# Patient Record
Sex: Male | Born: 1935 | Race: Black or African American | Hispanic: No | Marital: Married | Smoking: Current some day smoker
Health system: Southern US, Community
[De-identification: ages and names within clinical notes are randomized; demographics above are authoritative.]

## PROBLEM LIST (undated history)

## (undated) DIAGNOSIS — I498 Other specified cardiac arrhythmias: Secondary | ICD-10-CM

## (undated) DIAGNOSIS — Z973 Presence of spectacles and contact lenses: Secondary | ICD-10-CM

## (undated) DIAGNOSIS — G4733 Obstructive sleep apnea (adult) (pediatric): Secondary | ICD-10-CM

## (undated) DIAGNOSIS — Z950 Presence of cardiac pacemaker: Secondary | ICD-10-CM

## (undated) DIAGNOSIS — E7210 Disorders of sulfur-bearing amino-acid metabolism, unspecified: Secondary | ICD-10-CM

## (undated) DIAGNOSIS — E785 Hyperlipidemia, unspecified: Secondary | ICD-10-CM

## (undated) DIAGNOSIS — I10 Essential (primary) hypertension: Secondary | ICD-10-CM

## (undated) DIAGNOSIS — C61 Malignant neoplasm of prostate: Secondary | ICD-10-CM

## (undated) DIAGNOSIS — I6350 Cerebral infarction due to unspecified occlusion or stenosis of unspecified cerebral artery: Secondary | ICD-10-CM

## (undated) DIAGNOSIS — N259 Disorder resulting from impaired renal tubular function, unspecified: Secondary | ICD-10-CM

## (undated) DIAGNOSIS — I480 Paroxysmal atrial fibrillation: Secondary | ICD-10-CM

## (undated) DIAGNOSIS — M1990 Unspecified osteoarthritis, unspecified site: Secondary | ICD-10-CM

## (undated) DIAGNOSIS — Z972 Presence of dental prosthetic device (complete) (partial): Secondary | ICD-10-CM

## (undated) HISTORY — DX: Hyperlipidemia, unspecified: E78.5

## (undated) HISTORY — DX: Cerebral infarction due to unspecified occlusion or stenosis of unspecified cerebral artery: I63.50

## (undated) HISTORY — DX: Obstructive sleep apnea (adult) (pediatric): G47.33

## (undated) HISTORY — DX: Essential (primary) hypertension: I10

## (undated) HISTORY — PX: INGUINAL HERNIA REPAIR: SUR1180

## (undated) HISTORY — DX: Disorders of sulfur-bearing amino-acid metabolism, unspecified: E72.10

## (undated) HISTORY — DX: Other specified cardiac arrhythmias: I49.8

## (undated) HISTORY — DX: Paroxysmal atrial fibrillation: I48.0

## (undated) HISTORY — DX: Disorder resulting from impaired renal tubular function, unspecified: N25.9

## (undated) HISTORY — DX: Malignant neoplasm of prostate: C61

## (undated) HISTORY — DX: Unspecified osteoarthritis, unspecified site: M19.90

## (undated) HISTORY — PX: COLONOSCOPY: SHX174

---

## 1985-05-14 HISTORY — PX: CHOLECYSTECTOMY: SHX55

## 1998-06-06 ENCOUNTER — Ambulatory Visit (HOSPITAL_BASED_OUTPATIENT_CLINIC_OR_DEPARTMENT_OTHER): Admission: RE | Admit: 1998-06-06 | Discharge: 1998-06-06 | Payer: Self-pay | Admitting: General Surgery

## 2000-05-14 HISTORY — PX: ORCHIECTOMY: SHX2116

## 2000-09-23 ENCOUNTER — Encounter: Payer: Self-pay | Admitting: Emergency Medicine

## 2000-09-23 ENCOUNTER — Emergency Department (HOSPITAL_COMMUNITY): Admission: EM | Admit: 2000-09-23 | Discharge: 2000-09-23 | Payer: Self-pay

## 2001-01-29 ENCOUNTER — Encounter (INDEPENDENT_AMBULATORY_CARE_PROVIDER_SITE_OTHER): Payer: Self-pay | Admitting: *Deleted

## 2001-01-29 ENCOUNTER — Inpatient Hospital Stay (HOSPITAL_COMMUNITY): Admission: EM | Admit: 2001-01-29 | Discharge: 2001-02-08 | Payer: Self-pay

## 2001-01-29 ENCOUNTER — Encounter: Payer: Self-pay | Admitting: Pulmonary Disease

## 2001-01-30 ENCOUNTER — Encounter: Payer: Self-pay | Admitting: Pulmonary Disease

## 2001-02-03 ENCOUNTER — Encounter: Payer: Self-pay | Admitting: Pulmonary Disease

## 2001-03-07 ENCOUNTER — Ambulatory Visit (HOSPITAL_COMMUNITY): Admission: RE | Admit: 2001-03-07 | Discharge: 2001-03-07 | Payer: Self-pay | Admitting: Urology

## 2001-03-07 ENCOUNTER — Encounter: Payer: Self-pay | Admitting: Urology

## 2001-03-17 ENCOUNTER — Ambulatory Visit (HOSPITAL_COMMUNITY): Admission: RE | Admit: 2001-03-17 | Discharge: 2001-03-17 | Payer: Self-pay | Admitting: Urology

## 2001-03-17 ENCOUNTER — Encounter: Payer: Self-pay | Admitting: Urology

## 2001-06-03 ENCOUNTER — Ambulatory Visit (HOSPITAL_BASED_OUTPATIENT_CLINIC_OR_DEPARTMENT_OTHER): Admission: RE | Admit: 2001-06-03 | Discharge: 2001-06-03 | Payer: Self-pay | Admitting: Urology

## 2001-12-02 ENCOUNTER — Ambulatory Visit (HOSPITAL_BASED_OUTPATIENT_CLINIC_OR_DEPARTMENT_OTHER): Admission: RE | Admit: 2001-12-02 | Discharge: 2001-12-02 | Payer: Self-pay | Admitting: Urology

## 2001-12-16 ENCOUNTER — Encounter: Payer: Self-pay | Admitting: Urology

## 2001-12-16 ENCOUNTER — Encounter: Admission: RE | Admit: 2001-12-16 | Discharge: 2001-12-16 | Payer: Self-pay | Admitting: Urology

## 2002-03-09 ENCOUNTER — Encounter: Admission: RE | Admit: 2002-03-09 | Discharge: 2002-03-09 | Payer: Self-pay | Admitting: Urology

## 2002-03-09 ENCOUNTER — Encounter: Payer: Self-pay | Admitting: Urology

## 2002-03-12 ENCOUNTER — Ambulatory Visit (HOSPITAL_BASED_OUTPATIENT_CLINIC_OR_DEPARTMENT_OTHER): Admission: RE | Admit: 2002-03-12 | Discharge: 2002-03-12 | Payer: Self-pay | Admitting: Urology

## 2002-05-14 DIAGNOSIS — G4733 Obstructive sleep apnea (adult) (pediatric): Secondary | ICD-10-CM

## 2002-05-14 HISTORY — PX: OTHER SURGICAL HISTORY: SHX169

## 2002-05-14 HISTORY — DX: Obstructive sleep apnea (adult) (pediatric): G47.33

## 2002-08-16 ENCOUNTER — Ambulatory Visit (HOSPITAL_BASED_OUTPATIENT_CLINIC_OR_DEPARTMENT_OTHER): Admission: RE | Admit: 2002-08-16 | Discharge: 2002-08-16 | Payer: Self-pay | Admitting: Pulmonary Disease

## 2002-10-30 ENCOUNTER — Encounter: Payer: Self-pay | Admitting: Otolaryngology

## 2002-11-04 ENCOUNTER — Encounter (INDEPENDENT_AMBULATORY_CARE_PROVIDER_SITE_OTHER): Payer: Self-pay | Admitting: Specialist

## 2002-11-04 ENCOUNTER — Ambulatory Visit (HOSPITAL_COMMUNITY): Admission: RE | Admit: 2002-11-04 | Discharge: 2002-11-06 | Payer: Self-pay | Admitting: Otolaryngology

## 2002-11-05 ENCOUNTER — Encounter: Payer: Self-pay | Admitting: Cardiovascular Disease

## 2003-03-21 ENCOUNTER — Inpatient Hospital Stay (HOSPITAL_COMMUNITY): Admission: EM | Admit: 2003-03-21 | Discharge: 2003-03-23 | Payer: Self-pay | Admitting: Emergency Medicine

## 2003-04-13 ENCOUNTER — Encounter: Admission: RE | Admit: 2003-04-13 | Discharge: 2003-04-13 | Payer: Self-pay | Admitting: Urology

## 2004-06-11 ENCOUNTER — Inpatient Hospital Stay (HOSPITAL_COMMUNITY): Admission: EM | Admit: 2004-06-11 | Discharge: 2004-06-14 | Payer: Self-pay | Admitting: Emergency Medicine

## 2004-06-12 ENCOUNTER — Ambulatory Visit: Payer: Self-pay | Admitting: Pulmonary Disease

## 2004-06-12 ENCOUNTER — Encounter: Payer: Self-pay | Admitting: Cardiology

## 2004-06-12 ENCOUNTER — Ambulatory Visit: Payer: Self-pay | Admitting: Cardiology

## 2004-06-21 ENCOUNTER — Ambulatory Visit: Payer: Self-pay | Admitting: Pulmonary Disease

## 2004-08-24 ENCOUNTER — Ambulatory Visit: Payer: Self-pay | Admitting: Pulmonary Disease

## 2005-03-01 ENCOUNTER — Ambulatory Visit: Payer: Self-pay | Admitting: Pulmonary Disease

## 2006-04-08 ENCOUNTER — Ambulatory Visit: Payer: Self-pay | Admitting: Pulmonary Disease

## 2006-04-11 LAB — CONVERTED CEMR LAB
ALT: 14 units/L (ref 0–40)
AST: 21 units/L (ref 0–37)
Albumin: 3.6 g/dL (ref 3.5–5.2)
Alkaline Phosphatase: 108 units/L (ref 39–117)
BUN: 17 mg/dL (ref 6–23)
Basophils Absolute: 0 10*3/uL (ref 0.0–0.1)
Basophils Relative: 0.4 % (ref 0.0–1.0)
CO2: 25 meq/L (ref 19–32)
Calcium: 9.3 mg/dL (ref 8.4–10.5)
Chloride: 108 meq/L (ref 96–112)
Chol/HDL Ratio, serum: 4.3
Cholesterol: 224 mg/dL (ref 0–200)
Creatinine, Ser: 2 mg/dL — ABNORMAL HIGH (ref 0.4–1.5)
Eosinophil percent: 3.4 % (ref 0.0–5.0)
GFR calc non Af Amer: 35 mL/min
Glomerular Filtration Rate, Af Am: 43 mL/min/{1.73_m2}
Glucose, Bld: 100 mg/dL — ABNORMAL HIGH (ref 70–99)
HCT: 40.6 % (ref 39.0–52.0)
HDL: 51.5 mg/dL (ref >39.0)
Hemoglobin: 13.5 g/dL (ref 13.0–17.0)
LDL DIRECT: 159.6 mg/dL
Lymphocytes Relative: 31 % (ref 12.0–46.0)
MCHC: 33.3 g/dL (ref 30.0–36.0)
MCV: 87.4 fL (ref 78.0–100.0)
Monocytes Absolute: 0.5 10*3/uL (ref 0.2–0.7)
Monocytes Relative: 7.2 % (ref 3.0–11.0)
Neutro Abs: 4 10*3/uL (ref 1.4–7.7)
Neutrophils Relative %: 58 % (ref 43.0–77.0)
PSA: 0.31 ng/mL (ref 0.10–4.00)
Platelets: 161 10*3/uL (ref 150–400)
Potassium: 4.2 meq/L (ref 3.5–5.1)
RBC: 4.64 M/uL (ref 4.22–5.81)
RDW: 14.2 % (ref 11.5–14.6)
Sodium: 141 meq/L (ref 135–145)
TSH: 1.28 microintl units/mL (ref 0.35–5.50)
Total Bilirubin: 0.6 mg/dL (ref 0.3–1.2)
Total Protein: 6.7 g/dL (ref 6.0–8.3)
Triglyceride fasting, serum: 77 mg/dL (ref 0–149)
VLDL: 15 mg/dL (ref 0–40)
WBC: 6.8 10*3/uL (ref 4.5–10.5)

## 2006-04-15 ENCOUNTER — Ambulatory Visit: Payer: Self-pay | Admitting: Pulmonary Disease

## 2007-03-21 ENCOUNTER — Ambulatory Visit: Payer: Self-pay | Admitting: Pulmonary Disease

## 2007-11-03 ENCOUNTER — Encounter: Payer: Self-pay | Admitting: Pulmonary Disease

## 2007-11-28 DIAGNOSIS — E785 Hyperlipidemia, unspecified: Secondary | ICD-10-CM

## 2007-11-28 DIAGNOSIS — I10 Essential (primary) hypertension: Secondary | ICD-10-CM | POA: Insufficient documentation

## 2007-12-01 ENCOUNTER — Ambulatory Visit: Payer: Self-pay | Admitting: Pulmonary Disease

## 2007-12-01 DIAGNOSIS — I6350 Cerebral infarction due to unspecified occlusion or stenosis of unspecified cerebral artery: Secondary | ICD-10-CM | POA: Insufficient documentation

## 2007-12-01 DIAGNOSIS — C61 Malignant neoplasm of prostate: Secondary | ICD-10-CM

## 2007-12-01 DIAGNOSIS — E7210 Disorders of sulfur-bearing amino-acid metabolism, unspecified: Secondary | ICD-10-CM | POA: Insufficient documentation

## 2007-12-01 DIAGNOSIS — F17200 Nicotine dependence, unspecified, uncomplicated: Secondary | ICD-10-CM

## 2007-12-01 DIAGNOSIS — G4733 Obstructive sleep apnea (adult) (pediatric): Secondary | ICD-10-CM

## 2007-12-01 DIAGNOSIS — N183 Chronic kidney disease, stage 3 (moderate): Secondary | ICD-10-CM

## 2007-12-07 LAB — CONVERTED CEMR LAB
ALT: 18 units/L (ref 0–53)
AST: 19 units/L (ref 0–37)
Albumin: 3.9 g/dL (ref 3.5–5.2)
Alkaline Phosphatase: 111 units/L (ref 39–117)
BUN: 17 mg/dL (ref 6–23)
Basophils Absolute: 0 10*3/uL (ref 0.0–0.1)
Basophils Relative: 0.1 % (ref 0.0–3.0)
Bilirubin, Direct: 0.1 mg/dL (ref 0.0–0.3)
CO2: 24 meq/L (ref 19–32)
Calcium: 10 mg/dL (ref 8.4–10.5)
Chloride: 109 meq/L (ref 96–112)
Cholesterol: 244 mg/dL (ref 0–200)
Creatinine, Ser: 1.6 mg/dL — ABNORMAL HIGH (ref 0.4–1.5)
Direct LDL: 167.3 mg/dL
Eosinophils Absolute: 0.3 10*3/uL (ref 0.0–0.7)
Eosinophils Relative: 3.1 % (ref 0.0–5.0)
GFR calc Af Amer: 55 mL/min
GFR calc non Af Amer: 45 mL/min
Glucose, Bld: 89 mg/dL (ref 70–99)
HCT: 41.5 % (ref 39.0–52.0)
HDL: 44.6 mg/dL (ref >39.0)
Hemoglobin: 13.8 g/dL (ref 13.0–17.0)
Lymphocytes Relative: 29.3 % (ref 12.0–46.0)
MCHC: 33.1 g/dL (ref 30.0–36.0)
MCV: 88.5 fL (ref 78.0–100.0)
Monocytes Absolute: 0.4 10*3/uL (ref 0.1–1.0)
Monocytes Relative: 4.2 % (ref 3.0–12.0)
Neutro Abs: 5.3 10*3/uL (ref 1.4–7.7)
Neutrophils Relative %: 63.3 % (ref 43.0–77.0)
Platelets: 156 10*3/uL (ref 150–400)
Potassium: 3.9 meq/L (ref 3.5–5.1)
RBC: 4.69 M/uL (ref 4.22–5.81)
RDW: 14.5 % (ref 11.5–14.6)
Sodium: 141 meq/L (ref 135–145)
TSH: 1.67 microintl units/mL (ref 0.35–5.50)
Total Bilirubin: 0.6 mg/dL (ref 0.3–1.2)
Total CHOL/HDL Ratio: 5.5
Total Protein: 6.9 g/dL (ref 6.0–8.3)
Triglycerides: 217 mg/dL (ref 0–149)
VLDL: 43 mg/dL — ABNORMAL HIGH (ref 0–40)
WBC: 8.5 10*3/uL (ref 4.5–10.5)

## 2008-02-03 ENCOUNTER — Telehealth (INDEPENDENT_AMBULATORY_CARE_PROVIDER_SITE_OTHER): Payer: Self-pay | Admitting: *Deleted

## 2008-05-10 ENCOUNTER — Encounter: Payer: Self-pay | Admitting: Pulmonary Disease

## 2008-08-11 ENCOUNTER — Encounter: Payer: Self-pay | Admitting: Pulmonary Disease

## 2008-11-08 ENCOUNTER — Encounter: Payer: Self-pay | Admitting: Pulmonary Disease

## 2008-11-18 ENCOUNTER — Ambulatory Visit: Payer: Self-pay | Admitting: Pulmonary Disease

## 2008-11-18 ENCOUNTER — Encounter: Payer: Self-pay | Admitting: Adult Health

## 2008-11-18 DIAGNOSIS — I498 Other specified cardiac arrhythmias: Secondary | ICD-10-CM

## 2008-11-22 DIAGNOSIS — R609 Edema, unspecified: Secondary | ICD-10-CM

## 2008-11-24 ENCOUNTER — Encounter: Payer: Self-pay | Admitting: Pulmonary Disease

## 2009-02-13 ENCOUNTER — Emergency Department (HOSPITAL_COMMUNITY): Admission: EM | Admit: 2009-02-13 | Discharge: 2009-02-13 | Payer: Self-pay | Admitting: Emergency Medicine

## 2009-02-18 ENCOUNTER — Telehealth: Payer: Self-pay | Admitting: Pulmonary Disease

## 2009-02-18 ENCOUNTER — Ambulatory Visit: Payer: Self-pay | Admitting: Pulmonary Disease

## 2009-03-09 ENCOUNTER — Encounter: Payer: Self-pay | Admitting: Pulmonary Disease

## 2009-07-07 ENCOUNTER — Ambulatory Visit: Payer: Self-pay | Admitting: Pulmonary Disease

## 2009-07-09 DIAGNOSIS — M1990 Unspecified osteoarthritis, unspecified site: Secondary | ICD-10-CM | POA: Insufficient documentation

## 2009-07-09 LAB — CONVERTED CEMR LAB
ALT: 14 units/L (ref 0–53)
AST: 16 units/L (ref 0–37)
Albumin: 3.9 g/dL (ref 3.5–5.2)
Alkaline Phosphatase: 105 units/L (ref 39–117)
BUN: 17 mg/dL (ref 6–23)
Basophils Absolute: 0.1 10*3/uL (ref 0.0–0.1)
Basophils Relative: 0.7 % (ref 0.0–3.0)
Bilirubin, Direct: 0.1 mg/dL (ref 0.0–0.3)
CO2: 30 meq/L (ref 19–32)
Calcium: 10.7 mg/dL — ABNORMAL HIGH (ref 8.4–10.5)
Chloride: 110 meq/L (ref 96–112)
Cholesterol: 243 mg/dL — ABNORMAL HIGH (ref 0–200)
Creatinine, Ser: 1.9 mg/dL — ABNORMAL HIGH (ref 0.4–1.5)
Direct LDL: 168.4 mg/dL
Eosinophils Absolute: 0.1 10*3/uL (ref 0.0–0.7)
Eosinophils Relative: 1.8 % (ref 0.0–5.0)
GFR calc non Af Amer: 44.75 mL/min (ref >60)
Glucose, Bld: 94 mg/dL (ref 70–99)
HCT: 43 % (ref 39.0–52.0)
HDL: 61.3 mg/dL (ref >39.00)
Hemoglobin: 13.9 g/dL (ref 13.0–17.0)
Lymphocytes Relative: 30.9 % (ref 12.0–46.0)
Lymphs Abs: 2.5 10*3/uL (ref 0.7–4.0)
MCHC: 32.4 g/dL (ref 30.0–36.0)
MCV: 90.4 fL (ref 78.0–100.0)
Monocytes Absolute: 0.6 10*3/uL (ref 0.1–1.0)
Monocytes Relative: 7.2 % (ref 3.0–12.0)
Neutro Abs: 4.7 10*3/uL (ref 1.4–7.7)
Neutrophils Relative %: 59.4 % (ref 43.0–77.0)
PSA: 0.75 ng/mL (ref 0.10–4.00)
Platelets: 185 10*3/uL (ref 150.0–400.0)
Potassium: 4.8 meq/L (ref 3.5–5.1)
RBC: 4.76 M/uL (ref 4.22–5.81)
RDW: 14.4 % (ref 11.5–14.6)
Sodium: 145 meq/L (ref 135–145)
TSH: 1.5 microintl units/mL (ref 0.35–5.50)
Total Bilirubin: 0.4 mg/dL (ref 0.3–1.2)
Total CHOL/HDL Ratio: 4
Total Protein: 7.7 g/dL (ref 6.0–8.3)
Triglycerides: 140 mg/dL (ref 0.0–149.0)
VLDL: 28 mg/dL (ref 0.0–40.0)
WBC: 8 10*3/uL (ref 4.5–10.5)

## 2009-09-12 ENCOUNTER — Telehealth (INDEPENDENT_AMBULATORY_CARE_PROVIDER_SITE_OTHER): Payer: Self-pay | Admitting: *Deleted

## 2009-09-19 ENCOUNTER — Encounter: Payer: Self-pay | Admitting: Pulmonary Disease

## 2009-11-04 ENCOUNTER — Ambulatory Visit: Payer: Self-pay | Admitting: Pulmonary Disease

## 2009-11-05 LAB — CONVERTED CEMR LAB
BUN: 20 mg/dL (ref 6–23)
CO2: 28 meq/L (ref 19–32)
Calcium: 10.3 mg/dL (ref 8.4–10.5)
Chloride: 113 meq/L — ABNORMAL HIGH (ref 96–112)
Cholesterol: 177 mg/dL (ref 0–200)
Creatinine, Ser: 1.8 mg/dL — ABNORMAL HIGH (ref 0.4–1.5)
GFR calc non Af Amer: 48.52 mL/min (ref >60)
Glucose, Bld: 84 mg/dL (ref 70–99)
HDL: 57.9 mg/dL (ref >39.00)
LDL Cholesterol: 99 mg/dL (ref 0–99)
Potassium: 4.7 meq/L (ref 3.5–5.1)
Sodium: 145 meq/L (ref 135–145)
Total CHOL/HDL Ratio: 3
Triglycerides: 99 mg/dL (ref 0.0–149.0)
VLDL: 19.8 mg/dL (ref 0.0–40.0)

## 2010-03-27 ENCOUNTER — Encounter: Payer: Self-pay | Admitting: Pulmonary Disease

## 2010-04-13 ENCOUNTER — Ambulatory Visit: Payer: Self-pay | Admitting: Pulmonary Disease

## 2010-05-09 ENCOUNTER — Ambulatory Visit: Payer: Self-pay | Admitting: Pulmonary Disease

## 2010-05-10 LAB — CONVERTED CEMR LAB
BUN: 26 mg/dL — ABNORMAL HIGH (ref 6–23)
CO2: 26 meq/L (ref 19–32)
Calcium: 10.4 mg/dL (ref 8.4–10.5)
Chloride: 110 meq/L (ref 96–112)
Cholesterol: 180 mg/dL (ref 0–200)
Creatinine, Ser: 2 mg/dL — ABNORMAL HIGH (ref 0.4–1.5)
GFR calc non Af Amer: 42.33 mL/min — ABNORMAL LOW (ref >60.00)
Glucose, Bld: 91 mg/dL (ref 70–99)
HDL: 50.4 mg/dL (ref >39.00)
LDL Cholesterol: 96 mg/dL (ref 0–99)
Potassium: 4.8 meq/L (ref 3.5–5.1)
Sodium: 142 meq/L (ref 135–145)
Total CHOL/HDL Ratio: 4
Triglycerides: 170 mg/dL — ABNORMAL HIGH (ref 0.0–149.0)
VLDL: 34 mg/dL (ref 0.0–40.0)

## 2010-06-13 NOTE — Letter (Signed)
Summary: [facility]   Imported By: Sherian Rein 09/26/2009 11:58:22  _____________________________________________________________________  External Attachment:    Type:   Image     Comment:   External Document

## 2010-06-13 NOTE — Assessment & Plan Note (Signed)
Summary: 53mo follow up / cj   CC:  4 month ROV & review of mult medical problems....  History of Present Illness: 77 y/o BM here for a follow up visit... he has multiple medical problems as noted below...    ~  July 07, 2009:  he was last seen 7/09 for general med follow up...  he has a long hx of chronic non-compliance w/ medical therapy & since he was here last he admits to stopping his Aggrenox & Vytorin...  he still smokes but now <1ppd he says, & thinks he can quit on his own... his BP meds have been adjusted over the last yr- Off Doxazosin & Felodipine due to low BPs, and on Amlodipine 5mg  & Lasix 20mg  Prn for swelling> BP's at home are 150+ by his recollection... he's had follow up evals by DrVoytek for Ortho;  DrBerry for Cards; & DrWrenn for Urology>> see below for details...    **We decided to change the Aggrenox to ASA/ PLAVIX; Change Vytorin to [facility]; add LOSARTAN50 for his BP...   ~  November 04, 2009:  4 mo f/u & he states taking meds regularly> BP controlled on Amlod & Losar;  Chol managed w/ diet + Simva40; & no TIAs or CP on the ASA/ Plavix... he is still smoking 1/2 to 1ppd unfortunately but he has lost 8# down to 212# today...    Current Problem List:  OBSTRUCTIVE SLEEP APNEA (ICD-327.23) - hx prev ENT surgery by DrByers in 6/04...  CIGARETTE SMOKER (ICD-305.1) - he persists in smoking cigarettes despite his serious medical issues, prev stroke, HBP, etc...  ~  2/11: pt states <1ppd & he notes he can quit when he wants w/o help...  ~  CXR 2/11 showed tort vessels, lungs clear, thoracic spondylosis, NAD...  ~  6/11: still smoking 1/2 to 1ppd & not yet ready to quit, refuses smoking cessation help.  HYPERTENSION (ICD-401.9) - on AMLODIPINE 5mg /d & LOSARTAN 50mg /d... he has Hx of HBP, HCVD, & hx of a small intracerebral hemorrhage...  there is a long hx of non-compliance w/ his med regimen...  BP today = 140/60 & he states similar at home...  denies HA, fatigue, visual  changes, CP, palipit, dizziness, syncope, dyspnea, edema, etc...  ~  2/11:  as noted BP= 150/82 (& higher at home) on Amlodip5... rec> add LOSARTAN50.  ~  6/11: BP improved on the 2 meds> 140/60 & similar at home, reminded to take every day.  BRADYCARDIA (ICD-427.89) - pt was eval by DrBerry 7/10 for sinus bradycardia w/ rates in the 40's... he was asymptomatic w/ EKG showing LVH w/strain pattern; 2DEcho showing norm LVF; Cardiolite neg as well... decision made to continue observation & consider pacer if he becomes symptomatic...  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 prev on Foltx, but not currently taking any vitamins... advised regarding vitamin supplements needed.  HYPERLIPIDEMIA (ICD-272.4) - now on SIMVASTATIN 40mg /d, + diet Rx...  ~  FLP 11/07 showed TChol 224, TG 77, HDL 52, LDL 160... on diet alone- Vytorin 10-40 started...  ~  FLP 7/09 showed TChol 244, TG 217, HDL 45, LDL 167... not taking his Vytorin Rx.  ~  FLP 2/11 showed TChol 243, TG 140, HDL 61, LDL 168... not taking Vytorin, change to SIMVA40.  ~  FLP 6/11 on Simva40 showed TChol 177, TG 99, HDL 58, LDL 99... continue same.  Hx of METASTATIC PROSTATE CANCER (ICD-185) -  hx stage D Adenocarcinoma of the prostate w/ a large pelvic mass and bilat ureteral obstructions w/ renal failure (Creat= 12.2 & PSA >2600) Sep02... treated w/ orchiectomy and stents which were subsequently removed... his Creat improved to a baseline of  ~2, and his PSA's have improved to < 1 and stayed low... followed by DrWrenn regularly...  ~  10/10: note from DrWrenn indicates problems of Prostate Ca- s/p orchiectomy, renal insuffic w/ creat ~1.9, bladder neck contraction, & slowly rising PSA as outpt = 0.63 Jul10 w/ f/u Q32mo planned...  ~  5/11: note from DrWrenn indicates slowly rising PSA up to 0.92... continue watchful waiting.  RENAL INSUFFICIENCY (ICD-588.9) - baseline Creat  after recovery from the acute renal failure in 2002 has been  ~2.0...  ~  labs 7/09 showed BUN= 17, Creat= 1.6  ~  labs 2/11 showed BUN= 17, Creat= 1.9  ~  labs 6/11 showed BUN= 20, Creat= 1.8  DEGENERATIVE JOINT DISEASE (ICD-715.90) - eval by DrVoytek 3/10 for left shoulder pain, right knee pain- Dx= DJD & bursitis, given shot & MOBIC 7.5mg  (caution w/ his renal insuffic) but he prefers his horse linament  Hx of STROKE (ICD-434.91) - not taking his prev Aggrenox Rx- he stopped on his own  in 2009 stating that it caused him to be drowsey & sluggish (symptoms improved off Rx)... he was hosp 1/06 w/ TIA- right sided numbness, tingling, weakness... treated w/ heparin, then Aggrenox & improved... CT showed atophy, sm lacunar infarcts bilat, & chr ischemic changes... 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  ~  2/11: we discussed getting on ASA 81mg /d & PLAVIX 75mg /d as substitiute for Aggrenox due to his vasc dis.  ~  6/11: stable on the ASA/ Plavix w/o TIAs etc...   Preventive Screening-Counseling & Management  Alcohol-Tobacco     Smoking Status: current     Packs/Day: 1 ppd  Allergies (verified): No Known Drug Allergies  Comments:  Nurse/Medical Assistant: The patient's medications and allergies were reviewed with the patient and were updated in the Medication and Allergy Lists.  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 Father died age 35 w/ prostate cancer. Mother died age 16 w/ UTI, sepsis; otherw good health. 1 Sibling: Sister w/ DJD.  Social History: Reviewed history from 07/07/2009 and no changes required. Married, wife= [place], 56yrs. 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  The patient denies anorexia, fever, weight loss, weight gain, vision loss, decreased hearing, hoarseness, chest pain, syncope, peripheral edema, prolonged cough, headaches, hemoptysis, abdominal pain, melena, hematochezia, severe indigestion/heartburn, hematuria, incontinence, muscle weakness, suspicious skin lesions, transient blindness, difficulty walking, depression, unusual weight change, abnormal bleeding, enlarged lymph nodes, and angioedema.    Vital Signs:  Patient profile:   77 year old male Height:      71 inches Weight:      212  pounds BMI:     29.67 O2 Sat:      96 % on Room air Temp:     97.3 degrees F oral Pulse rate:   45 / minute BP sitting:   140 / 60  (left arm) Cuff size:   regular  Vitals Entered By: Randell Loop CMA (November 04, 2009 11:40 AM)  O2 Sat at Rest %:  96 O2 Flow:  Room air CC: 4 month ROV & review of mult medical problems... Is Patient Diabetic? No Pain Assessment Patient in pain? no      Comments meds updated today with pt   Physical Exam  Additional Exam:  WD, WN, 77 y/o BM in NAD... GENERAL:  Alert & oriented; pleasant & cooperative... HEENT:  [place]/AT, EOM-wnl, PERRLA, EACs-clear, TMs-wnl, NOSE-clear, THROAT-clear & wnl. NECK:  Supple w/ fairROM; no JVD; normal carotid impulses w/o bruits; no thyromegaly or nodules palpated; no lymphadenopathy. CHEST:  Clear to P & A; without wheezes/ rales/ or rhonchi heard... HEART:  Regular Rhythm; gr 1/6 SEM without rubs or gallops apprec... ABDOMEN:  Soft &  nontender; normal bowel sounds; no organomegaly or masses detected. EXT: without deformities, mod arthritic changes; no varicose veins/ +venous insuffic/ 1-2  edema. NEURO:  CN's intact; no focal neuro deficits... DERM:  No lesions noted; no rash etc...    MISC. Report  Procedure date:  11/04/2009  Findings:      BMP (METABOL)   Sodium                    145 mEq/L                   135-145   Potassium                 4.7 mEq/L                   3.5-5.1   Chloride             [H]  113 mEq/L                   96-112   Carbon Dioxide            28 mEq/L                    19-32   Glucose                   84 mg/dL                    16-10   BUN                       20 mg/dL                    9-60   Creatinine           [H]  1.8 mg/dL                   4.5-4.0   Calcium                   10.3 mg/dL                  9.8-11.9   GFR                       48.52 mL/min                >  60   Lipid Panel (LIPID)   Cholesterol               177 mg/dL                   2-841   Triglycerides             99.0 mg/dL                  3.2-440.1   HDL                       02.72 mg/dL                 >53.66   LDL Cholesterol           99 mg/dL                    4-40   Impression & Recommendations:  Problem # 1:  CIGARETTE SMOKER (ICD-305.1) We discussed the import of smoking cessation, AGAIN!!! offered Chantix etc but he declines help...  Problem # 2:  HYPERTENSION (ICD-401.9) BP controlled on these 2 meds>  continue the same. The following medications were removed from the medication list:    Furosemide 20 Mg Tabs (Furosemide) .Marland Kitchen... Take 1 tab by mouth once daily as needed for swelling... His updated medication list for this problem includes:    Amlodipine Besylate 5 Mg Tabs (Amlodipine besylate) .Marland Kitchen... 1 by mouth once daily    Losartan Potassium 50 Mg Tabs (Losartan potassium) .Marland Kitchen... Take 1 tab by mouth once daily...  Orders: TLB-BMP (Basic Metabolic Panel-BMET) (80048-METABOL)  Problem  # 3:  HYPERLIPIDEMIA (ICD-272.4) IMPROVED on the Simva40> encouraged to take daily!!! His updated medication list for this problem includes:    Simvastatin 40 Mg Tabs (Simvastatin) .Marland Kitchen... Take 1 tab by mouth at bedtime...  Orders: TLB-Lipid Panel (80061-LIPID)  Problem # 4:  Hx of METASTATIC PROSTATE CANCER (ICD-185) Followed by DrWrenn Q82mo w/ slowly rising PSA up to 0.92 4/11> it was >2600 at diagnosis w/ met dis!  Problem # 5:  RENAL INSUFFICIENCY (ICD-588.9) Stable mild renal insuffic w/ Creat= 1.8..Marland Kitchen Orders: TLB-BMP (Basic Metabolic Panel-BMET) (80048-METABOL) TLB-Lipid Panel (80061-LIPID)  Problem # 6:  Hx of STROKE (ICD-434.91) No residuals... he is admonished to take the ASA/ Plavix regularly... His updated medication list for this problem includes:    Aspirin 81 Mg Tbec (Aspirin) .Marland Kitchen... Take 1 tab by mouth once daily...    Plavix 75 Mg Tabs (Clopidogrel bisulfate) .Marland Kitchen... Take 1 tab by mouth once daily...  Complete Medication List: 1)  Aspirin 81 Mg Tbec (Aspirin) .... Take 1 tab by mouth once daily.Marland KitchenMarland Kitchen 2)  Plavix 75 Mg Tabs (Clopidogrel bisulfate) .... Take 1 tab by mouth once daily.Marland KitchenMarland Kitchen 3)  Amlodipine Besylate 5 Mg Tabs (Amlodipine besylate) .Marland Kitchen.. 1 by mouth once daily 4)  Losartan Potassium 50 Mg Tabs (Losartan potassium) .... Take 1 tab by mouth once daily.Marland KitchenMarland Kitchen 5)  Simvastatin 40 Mg Tabs (Simvastatin) .... Take 1 tab by mouth at bedtime.Marland KitchenMarland Kitchen 6)  Multivitamins Tabs (Multiple vitamin) .... Take 1 tablet by mouth once a day  Patient Instructions: 1)  Today we updated your med list- see below.... 2)  Continue your current meds the same... 3)  today we did your follow up FASTING blood work... please call the "phone tree" in a few days for your lab results.Marland KitchenMarland Kitchen  4)  Keep up the good work w/ diet + exercise... 5)  Call  for any questions.Marland KitchenMarland Kitchen 6)  Please schedule a follow-up appointment in 6 months.

## 2010-06-13 NOTE — Letter (Signed)
Summary: [facility]   Imported By: Sherian Rein 04/17/2010 08:21:44  _____________________________________________________________________  External Attachment:    Type:   Image     Comment:   External Document

## 2010-06-13 NOTE — Assessment & Plan Note (Signed)
Summary: flu shot ///kp  Nurse Visit   Allergies: No Known Drug Allergies  Orders Added: 1)  Flu Vaccine 57yrs + MEDICARE PATIENTS [Q2039] 2)  Administration Flu vaccine - MCR [G0008] Flu Vaccine Consent Questions     Do you have a history of severe allergic reactions to this vaccine? no    Any prior history of allergic reactions to egg and/or gelatin? no    Do you have a sensitivity to the preservative Thimersol? no    Do you have a past history of Guillan-Barre Syndrome? no    Do you currently have an acute febrile illness? no    Have you ever had a severe reaction to latex? no    Vaccine information given and explained to patient? yes    Are you currently pregnant? no    Lot Number:[id number]   Exp Date:11/11/2010   Site Given  Left Deltoid IM .Tammy Scott  April 13, 2010 3:38 PM

## 2010-06-13 NOTE — Assessment & Plan Note (Signed)
Summary: CPX/ MBW   CC:  19 month ROV & review of mult medical problems....  History of Present Illness: 77 y/o BM here for a follow up visit... he has multiple medical problems as noted below...    ~  July 07, 2009:  he was last seen 7/09 for general med follow up...  he has a long hx of chronic non-compliance w/ medical therapy & since he was here last he admits to stopping his Aggrenox & Vytorin...  he still smokes but now <1ppd he says, & thinks he can quit on his own... his BP meds have been adjusted over the last yr- Off Doxazosin & Felodipine due to low BPs, and on Amlodipine 5mg  & Lasix 20mg  Prn for swelling> BP's at home are 150+ by his recollection... he's had follow up evals by DrVoytek for Ortho;  DrBerry for Cards:  & DrWrenn for Urology>> see below for details...    **We decided to change the Aggrenox to ASA/ PLAVIX; Change Vytorin to [facility]; add LOSARTAN50 for his BP...    Current Problem List:  OBSTRUCTIVE SLEEP APNEA (ICD-327.23) - hx prev ENT surgery by DrByers in 6/04...  CIGARETTE SMOKER (ICD-305.1) - he persists in smoking cigarettes despite his serious medical issues, prev stroke, HBP, etc...  ~  2/11: pt states <1ppd & he notes he can quit when he wants w/o help...  ~  CXR 2/11 showed tort vessels, lungs clear, thoracic spondylosis, NAD...  HYPERTENSION (ICD-401.9) - on AMLODIPINE 5mg /d (prev on Doxazosin & Felodipine- ?stopped he says due to low BPs)... he has Hx of HBP, HCVD, & hx of a small intracerebral hemorrhage...  there is a long hx of non-compliance w/ his med regimen...  BP today = 152/82 & he notes higher at home...  denies HA, fatigue, visual changes, CP, palipit, dizziness, syncope, dyspnea, edema, etc...  ~  2/11:  as noted BP= 150/82 (& higher at home) on Amlodip5... rec> add LOSARTAN50.  BRADYCARDIA (ICD-427.89) - pt was eval by DrBerry 7/10 for sinus bradycardia w/ rates in the 40's... he was asymptomatic w/ EKG showing LVH w/strain pattern; 2DEcho  showing norm LVF; Cardiolite neg as well... decision made to continue observation & consider pacer if he becomes symptomatic...  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 prev on Foltx, but not currently taking any vitamins... advised regarding vitamin supplements needed.  HYPERLIPIDEMIA (ICD-272.4) - on diet alone now (he stopped his prev Vytorin 10-40 on his own)...  ~  FLP 11/07 showed TChol 224, TG 77, HDL 52, LDL 160... on diet alone- Vytorin 10-40 started...  ~  FLP 7/09 showed TChol 244, TG 217, HDL 45, LDL 167... not taking his Vytorin Rx.  ~  FLP 2/11 showed TChol 243, TG 140, HDL 61, LDL 168... not taking Vytorin, change to SIMVA40.  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 treated w/ orchiectomy and stents which were subsequently removed... his Creat improved to a baseline of  ~2, and his PSA's have improved to < 1 and stayed low... followed by DrWrenn and last seen 6/09 w/ PSA OK per pt...  ~  10/10: note from DrWrenn indicates problems of Prostate Ca- s/p orchiectomy, renal insuffic w/ creat ~1.9, bladder neck contraction, & slowly rising PSA as outpt = 0.63 Jul10 w/ f/u  Q66mo planned...  RENAL INSUFFICIENCY (ICD-588.9) - baseline Creat after recovery from the acute renal failure in 2002 has been  ~2.0...  ~  labs 7/09 showed BUN= 17, Creat= 1.6  ~  labs 2/11 showed BUN= 17, Creat= 1.9  DEGENERATIVE JOINT DISEASE (ICD-715.90) - eval by DrVoytek 3/10 for left shoulder pain, right knee pain- Dx= DJD & bursitis, given shot & MOBIC 7.5mg  (caution w/ his renal insuffic) but he prefers his horse linament  Hx of STROKE (ICD-434.91) - not taking his prev Aggrenox Rx- he stopped on his own  in 2009 stating that it caused him to be drowsey & sluggish (symptoms improved  off Rx)... he was hosp 1/06 w/ TIA- right sided numbness, tingling, weakness... treated w/ heparin, then Aggrenox & improved... CT showed atophy, sm lacunar infarcts bilat, & chr ischemic changes... 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  ~  2/11: we discussed getting on ASA 81mg /d & PLAVIX 75mg /d as substitiute for Aggrenox due to his vasc dis.   Allergies (verified): No Known Drug Allergies  Comments:  Nurse/Medical Assistant: The patient's medications and allergies were reviewed with the patient and were updated in the Medication and Allergy Lists.  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 Mother died age 1 w/ UTI, sepsis; otherw good health. 1 Sibling: Sister w/ DJD.  Social History: Married, wife= [place], 37yrs. 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  The patient denies fever, chills, sweats, anorexia, weakness, weight loss, sleep disorder, blurring, diplopia, eye irritation, eye discharge, vision loss, eye pain, photophobia, earache, ear discharge, tinnitus, decreased hearing, nasal congestion, nosebleeds, sore throat, hoarseness, chest pain, palpitations, syncope, dyspnea on exertion, orthopnea, PND, peripheral edema, cough, dyspnea at rest, excessive sputum, hemoptysis,  wheezing, pleurisy, nausea, vomiting, diarrhea, constipation, change in bowel habits, abdominal pain, melena, hematochezia, jaundice, gas/bloating, indigestion/heartburn, dysphagia, odynophagia, dysuria, hematuria, urinary frequency, urinary hesitancy, nocturia, incontinence, back pain, joint pain, joint swelling, muscle cramps, muscle weakness, stiffness, arthritis, sciatica, restless legs, leg pain at night, leg pain with exertion, rash, itching, dryness, suspicious lesions, paralysis, paresthesias, seizures, tremors, vertigo, transient blindness, frequent falls, frequent headaches, difficulty walking, depression, anxiety, memory loss, confusion, cold intolerance, heat intolerance, polydipsia, polyphagia, polyuria, unusual weight change, abnormal bruising, bleeding, enlarged lymph nodes, urticaria, allergic rash, hay fever, and recurrent infections.    Vital Signs:  Patient profile:   77 year old male Height:      71 inches Weight:      220 pounds O2 Sat:      96 % on Room air Temp:     97.7 degrees F oral Pulse rate:   41 / minute BP sitting:   152 / 82  (left arm) Cuff size:   regular  Vitals Entered By: Randell Loop CMA (July 07, 2009 9:24 AM)  O2 Sat at Rest %:  96 O2 Flow:  Room air CC: 19 month ROV & review of mult  medical problems... Is Patient Diabetic? No Pain Assessment Patient in pain? no      Comments meds updated today   Physical Exam  Additional Exam:  WD, WN, 77 y/o BM in NAD... GENERAL:  Alert & oriented; pleasant & cooperative... HEENT:  [place]/AT, EOM-wnl, PERRLA, EACs-clear, TMs-wnl, NOSE-clear, THROAT-clear & wnl. NECK:  Supple w/ fairROM; no JVD; normal carotid impulses w/o bruits; no thyromegaly or nodules palpated; no lymphadenopathy. CHEST:  Clear to P & A; without wheezes/ rales/ or rhonchi heard... HEART:  Regular Rhythm; gr 1/6 SEM without rubs or gallops apprec... ABDOMEN:  Soft & nontender; normal bowel sounds; no organomegaly or masses  detected. EXT: without deformities, mod arthritic changes; no varicose veins/ +venous insuffic/ 1-2  edema. NEURO:  CN's intact; no focal neuro deficits... DERM:  No lesions noted; no rash etc...    CXR  Procedure date:  07/07/2009  Findings:      CHEST - 2 VIEW Comparison: 03/12/2009.   Findings: Thoracic aortic ectasia/tortuosity.  Normal cardiac size. Lungs hyperaerated but clear of an active process.  Dorsal spine spondylosis.   IMPRESSION: COPD/emphysema.  No acute findings.   Read By:  Jonne Ply,  M.D.   MISC. Report  Procedure date:  07/07/2009  Findings:      Lipid Panel (LIPID)   Cholesterol          [H]  243 mg/dL                   4-098   Triglycerides             140.0 mg/dL                 1.1-914.7   HDL                       82.95 mg/dL                 >62.13 Cholesterol LDL - Direct                             168.4 mg/dL  BMP (METABOL)   Sodium                    145 mEq/L                   135-145   Potassium                 4.8 mEq/L                   3.5-5.1   Chloride                  110 mEq/L                   96-112   Carbon Dioxide            30 mEq/L                    19-32   Glucose                   94 mg/dL                    08-65   BUN  17 mg/dL                    1-61   Creatinine           [H]  1.9 mg/dL                   0.9-6.0   Calcium              [H]  10.7 mg/dL                  4.5-40.9   GFR                       44.75 mL/min                >60  Hepatic/Liver Function Panel (HEPATIC)   Total Bilirubin           0.4 mg/dL                   8.1-1.9   Direct Bilirubin          0.1 mg/dL                   1.4-7.8   Alkaline Phosphatase      105 U/L                     39-117   AST                       16 U/L                      0-37   ALT                       14 U/L                      0-53   Total Protein             7.7 g/dL                    2.9-5.6   Albumin                   3.9 g/dL                     2.1-3.0  Comments:      CBC Platelet w/Diff (CBCD)   White Cell Count          8.0 K/uL                    4.5-10.5   Red Cell Count            4.76 Mil/uL                 4.22-5.81   Hemoglobin                13.9 g/dL                   86.5-78.4   Hematocrit                43.0 %                      39.0-52.0   MCV  90.4 fl                     78.0-100.0   Platelet Count            185.0 K/uL                  150.0-400.0   Neutrophil %              59.4 %                      43.0-77.0   Lymphocyte %              30.9 %                      12.0-46.0   Monocyte %                7.2 %                       3.0-12.0   Eosinophils%              1.8 %                       0.0-5.0   Basophils %               0.7 %                       0.0-3.0   TSH (TSH)   FastTSH                   1.50 uIU/mL                 0.35-5.50  Prostate Specific Antigen (PSA)   PSA-Hyb                   0.75 ng/mL                  0.10-4.00   Impression & Recommendations:  Problem # 1:  CIGARETTE SMOKER (ICD-305.1) His CXR shows some underlying COPD- clear, NAD... he must quit all smoking but he is not motivated to do so... we discussed smoking cessation strategies including cessation programs, counselling, nicotine replacement, and Chantix receptor blockade... the pt is not interested at this time but we left the door open should she like to reconsider at any time.  Orders: T-2 View CXR (71020TC)  Problem # 2:  HYPERTENSION (ICD-401.9) His BP has been elevated ?on the Amlodipine alone, ?compliance... we discussed the need for regular daily BP/ heart meds in light of his LVH, prev stroke & cerebrovasc dis... recommended to continue the Amlodip5mg  & add LOSARTAN 50mg /d... monitor BP at home & call if BP not consistantly <150/90. The following medications were removed from the medication list:    Doxazosin Mesylate 8 Mg Tabs (Doxazosin mesylate) .Marland Kitchen... Take 1 tablet by  mouth once a day His updated medication list for this problem includes:    Amlodipine Besylate 5 Mg Tabs (Amlodipine besylate) .Marland Kitchen... 1 by mouth once daily    Losartan Potassium 50 Mg Tabs (Losartan potassium) .Marland Kitchen... Take 1 tab by mouth once daily...    Furosemide 20 Mg Tabs (Furosemide) .Marland Kitchen... Take 1 tab by mouth once daily as needed for swelling...  Orders: T-2 View CXR (71020TC) TLB-Lipid Panel (80061-LIPID) TLB-BMP (Basic Metabolic Panel-BMET) (80048-METABOL) TLB-Hepatic/Liver Function Pnl (  80076-HEPATIC) TLB-CBC Platelet - w/Differential (85025-CBCD) TLB-TSH (Thyroid Stimulating Hormone) (84443-TSH) TLB-PSA (Prostate Specific Antigen) (84153-PSA)  Problem # 3:  BRADYCARDIA (ICD-427.89) Followed by drBerry, [facility]... on observation alone w/ ?pacer if he becomes symptomatic... The following medications were removed from the medication list:    Aggrenox 25-200 Mg Xr12h-cap (Aspirin-dipyridamole) .Marland Kitchen... Take 1 tablet by mouth two times a day His updated medication list for this problem includes:    Aspirin 81 Mg Tbec (Aspirin) .Marland Kitchen... Take 1 tab by mouth once daily...    Plavix 75 Mg Tabs (Clopidogrel bisulfate) .Marland Kitchen... Take 1 tab by mouth once daily...  Problem # 4:  HYPERLIPIDEMIA (ICD-272.4) He has never taken any medication regularly... FLP is essentially unchanged... rec> start SIMVA40... The following medications were removed from the medication list:    Vytorin 10-40 Mg Tabs (Ezetimibe-simvastatin) .Marland Kitchen... Take 1 tablet by mouth once a day His updated medication list for this problem includes:    Simvastatin 40 Mg Tabs (Simvastatin) .Marland Kitchen... Take 1 tab by mouth at bedtime...  Problem # 5:  Hx of METASTATIC PROSTATE CANCER (ICD-185) Followed by DrWrenn w/ very slowly rising PSA now 0.75... copy of our labs to DrWrenn to review...  Problem # 6:  RENAL INSUFFICIENCY (ICD-588.9) His renal function has remained stable since his prostate cancer Rx...  Problem # 7:  DEGENERATIVE JOINT DISEASE  (ICD-715.90) Continue Prn Rx and f/u DrVoytek Prn... His updated medication list for this problem includes:    Aspirin 81 Mg Tbec (Aspirin) .Marland Kitchen... Take 1 tab by mouth once daily...  Problem # 8:  Hx of STROKE (ICD-434.91) Known cerebrovasc dis as noted... primary Rx was Aggrenox per Neurology but he hasn't taken this in >51yr... we discussed alternative Rx w/ ASA/  PLAVIX & he will try to comply in order to decr his risk of a disabling stroke. The following medications were removed from the medication list:    Aggrenox 25-200 Mg Xr12h-cap (Aspirin-dipyridamole) .Marland Kitchen... Take 1 tablet by mouth two times a day His updated medication list for this problem includes:    Aspirin 81 Mg Tbec (Aspirin) .Marland Kitchen... Take 1 tab by mouth once daily...    Plavix 75 Mg Tabs (Clopidogrel bisulfate) .Marland Kitchen... Take 1 tab by mouth once daily...  Complete Medication List: 1)  Aspirin 81 Mg Tbec (Aspirin) .... Take 1 tab by mouth once daily.Marland KitchenMarland Kitchen 2)  Plavix 75 Mg Tabs (Clopidogrel bisulfate) .... Take 1 tab by mouth once daily.Marland KitchenMarland Kitchen 3)  Amlodipine Besylate 5 Mg Tabs (Amlodipine besylate) .Marland Kitchen.. 1 by mouth once daily 4)  Losartan Potassium 50 Mg Tabs (Losartan potassium) .... Take 1 tab by mouth once daily.Marland KitchenMarland Kitchen 5)  Furosemide 20 Mg Tabs (Furosemide) .... Take 1 tab by mouth once daily as needed for swelling... 6)  Simvastatin 40 Mg Tabs (Simvastatin) .... Take 1 tab by mouth at bedtime.Marland KitchenMarland Kitchen 7)  Multivitamins Tabs (Multiple vitamin) .... Take 1 tablet by mouth once a day  Other Orders: Prescription Created Electronically [phone])  Patient Instructions: 1)  Today we updated your med list- see below.... 2)  We decided to change the Aggrenox to ASPIRIN + PLAVIX.Marland KitchenMarland Kitchen 3)  We added LOSARTAN for your BP.Marland KitchenMarland Kitchen 4)  We also changed the Vytorin to SIMVASTATIN for your Cholesterol. 5)  ... and you promised to cut back further on the smoking & try to quit altogether!!! 6)  Today we did your follow up CXR & FASTING blood work... please call the "phone  tree" in a few days for your lab results.Marland KitchenMarland Kitchen 7)  Let's plan  a follow up appt in 3-4 months to recheck your BP, Chol, etc on the medication.Marland KitchenMarland Kitchen 8)  Call for any questions... Prescriptions: LOSARTAN POTASSIUM 50 MG TABS (LOSARTAN POTASSIUM) take 1 tab by mouth once daily...  #30 x prn   Entered and Authorized by:   Michele Mcalpine MD   Signed by:   Michele Mcalpine MD on 07/07/2009   Method used:   Print then Give to Patient   RxID:   [id number] SIMVASTATIN 40 MG TABS (SIMVASTATIN) take 1 tab by mouth at bedtime...  #30 x prn   Entered and Authorized by:   Michele Mcalpine MD   Signed by:   Michele Mcalpine MD on 07/07/2009   Method used:   Print then Give to Patient   RxID:   [id number] PLAVIX 75 MG TABS (CLOPIDOGREL BISULFATE) take 1 tab by mouth once daily...  #30 x prn   Entered and Authorized by:   Michele Mcalpine MD   Signed by:   Michele Mcalpine MD on 07/07/2009   Method used:   Print then Give to Patient   RxID:   [id number] AMLODIPINE BESYLATE 5 MG TABS (AMLODIPINE BESYLATE) 1 by mouth once daily  #30 x prn   Entered and Authorized by:   Michele Mcalpine MD   Signed by:   Michele Mcalpine MD on 07/07/2009   Method used:   Print then Give to Patient   RxID:   [id number]

## 2010-06-13 NOTE — Progress Notes (Signed)
Summary: APPOINTMENT  Phone Note Call from Patient   Caller: Patient Call For: NADEL Summary of Call: PT NEED APPT [facility] FOR MAY Initial call taken by: Rickard Patience,  Sep 12, 2009 9:01 AM  Follow-up for Phone Call        Per last ov in Feb with Sn - pt was told to f/u in 3-4 mo.  Spoke with pt's wife, Aurea Graff.  Per Aurea Graff, pt walked out at last ov in [place] and forgot to schedule appt for May.  No opening with SN in May.  INformed her pt was told to f/u in 3-4 mo - she is ok with June appt.  OV scheduled - she is aware of appt date/time.  Gweneth Dimitri RN  Sep 12, 2009 9:15 AM

## 2010-06-15 NOTE — Assessment & Plan Note (Signed)
Summary: 6 months/ mbw   CC:  6 month ROV & review of mult medical problems....  History of Present Illness: 77 y/o BM here for a follow up visit... he has multiple medical problems as noted below...    ~  July 07, 2009:  he was last seen 7/09 for general med follow up...  he has a long hx of chronic non-compliance w/ medical therapy & since he was here last he admits to stopping his Aggrenox & Vytorin...  he still smokes but now <1ppd he says, & thinks he can quit on his own... his BP meds have been adjusted over the last yr- Off Doxazosin & Felodipine due to low BPs, and on Amlodipine 5mg  & Lasix 20mg  Prn for swelling> BP's at home are 150+ by his recollection... he's had follow up evals by DrVoytek for Ortho;  DrBerry for Cards; & DrWrenn for Urology>> see below for details...    **We decided to change the Aggrenox to ASA/ PLAVIX; Change Vytorin to [facility]; add LOSARTAN50 for his BP...   ~  November 04, 2009:  4 mo f/u & he states taking meds regularly> BP controlled on Amlod & Losar;  Chol managed w/ diet + Simva40; & no TIAs or CP on the ASA/ Plavix... he is still smoking 1/2 to 1ppd unfortunately but he has lost 8# down to 212# today...    ~  May 09, 2010:  56mo ROV & he tells me he quit smoking 8/11 on his own "I just decided"... his only complaint= his arthritis but it is still fairly mild, uses Tylenol Prn & doesn't want stronger pain meds (he knows to avoid NSAIDs due to renal insuffic)... he has f/u Urology DrWrenn 11/11 (hx advanced prostate cancer 2005 treated w/ orchiectomy) w/ PSA= 0.72 (stable) & he noted 18-24 month PSA doubling time on observation... renal insuffic stable w/ Creat  ~2... he's already had the 2011 Flu vaccine.   Current Problem List:  OBSTRUCTIVE SLEEP APNEA (ICD-327.23) - hx prev ENT surgery by DrByers in 6/04...  CIGARETTE SMOKER (ICD-305.1) - Hx serious medical issues (prev stroke, HBP, etc) & he finally quit 8/11 he says.  ~  2/11: pt states <1ppd & he  notes he can quit when he wants w/o help...  ~  CXR 2/11 showed tort vessels, lungs clear, thoracic spondylosis, NAD...  ~  6/11: still smoking 1/2 to 1ppd & not yet ready to quit, refuses smoking cessation help.  ~  12/11:  he reports quitting on his own 8/11...  HYPERTENSION (ICD-401.9) - on AMLODIPINE 5mg /d & LOSARTAN 50mg /d... he has Hx of HBP, HCVD, & hx of a small intracerebral hemorrhage...  there is a long hx of non-compliance w/ his med regimen...  BP today = 126/82 & he states similar at home...  denies HA, fatigue, visual changes, CP, palipit, dizziness, syncope, dyspnea, edema, etc...  BRADYCARDIA (ICD-427.89) - pt was eval by DrBerry 7/10 for sinus bradycardia w/ rates in the 40's... he was asymptomatic w/ EKG showing LVH w/strain pattern; 2DEcho showing norm LVF; Cardiolite neg as well... decision made to continue observation & consider pacer if he becomes symptomatic...  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 prev on Foltx, but not currently taking any vitamins... advised regarding vitamin supplements needed.  HYPERLIPIDEMIA (ICD-272.4) - now on SIMVASTATIN 40mg /d, + diet Rx...  ~  FLP 11/07 showed TChol 224, TG 77, HDL 52, LDL 160... on diet  alone- Vytorin 10-40 started...  ~  FLP 7/09 showed TChol 244, TG 217, HDL 45, LDL 167... not taking his Vytorin Rx.  ~  FLP 2/11 showed TChol 243, TG 140, HDL 61, LDL 168... not taking Vytorin, change to SIMVA40.  ~  FLP 6/11 on Simva40 showed TChol 177, TG 99, HDL 58, LDL 99... continue same.  ~  FLP 12/11 on Simva40 showed TChol 180, TG 170, HDL 50, LDL 96  Hx of METASTATIC PROSTATE CANCER (ICD-185) - hx stage D Adenocarcinoma of the prostate w/ a large pelvic mass and bilat ureteral obstructions w/ renal failure (Creat= 12.2 & PSA >2600) Sep02... treated w/ orchiectomy and stents which were subsequently removed... his Creat improved to a baseline of  ~2, and his  PSA's have improved to < 1 and stayed low... followed by DrWrenn regularly...  ~  Note from DrWrenn 10/10  indicates problems of Prostate Ca- s/p orchiectomy, renal insuffic w/ creat ~1.9, bladder neck contraction, & slowly rising PSA= 0.63 Jul10 w/ f/u Q37mo planned...  ~  Note from DrWrenn 11/11 indicated PSA= 0.72, stable...  RENAL INSUFFICIENCY (ICD-588.9) - baseline Creat after recovery from the acute renal failure in 2002 has been  ~2.0...  ~  labs 7/09 showed BUN= 17, Creat= 1.6  ~  labs 2/11 showed BUN= 17, Creat= 1.9  ~  labs 6/11 showed BUN= 20, Creat= 1.8  ~  labs 12/11 showed BUN= 26, Creat= 2.0  DEGENERATIVE JOINT DISEASE (ICD-715.90) - eval by DrVoytek 3/10 for left shoulder pain, right knee pain- Dx= DJD & bursitis, given shot & MOBIC 7.5mg  (caution w/ his renal insuffic) but he prefers his horse linament & Tylenol Prn...  Hx of STROKE (ICD-434.91) - not taking his prev Aggrenox Rx- he stopped on his own in 2009 stating that it caused him to be drowsey & sluggish (symptoms improved off Rx)... he was hosp 1/06 w/ TIA- right sided numbness, tingling, weakness... treated w/ heparin, then Aggrenox & improved... CT showed atophy, sm lacunar infarcts bilat, & chr ischemic changes... 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  ~  2/11: we discussed getting on ASA 81mg /d & PLAVIX 75mg /d as substitiute for Aggrenox due to his vasc dis.  ~  12/11: stable on the ASA/ Plavix w/o TIAs etc...   Preventive Screening-Counseling & Management  Alcohol-Tobacco     Smoking Status: quit     Year Quit: oct/2011  Allergies (verified): No Known Drug Allergies  Comments:  Nurse/Medical Assistant: The patient's medications and allergies were reviewed with the patient and were updated in the Medication and Allergy Lists.  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 Father died age 15 w/ prostate cancer. Mother died age 6 w/ UTI, sepsis; otherw good health. 1 Sibling: Sister w/ DJD.  Social History: Reviewed history from 07/07/2009 and no changes required. Married, wife= [place], 37yrs. 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  The patient denies anorexia, fever, weight loss, weight gain, vision loss, decreased hearing, hoarseness, chest pain, syncope, peripheral edema, prolonged cough, headaches, hemoptysis, abdominal pain, melena, hematochezia, severe indigestion/heartburn, hematuria, genital sores, muscle weakness, suspicious skin lesions, transient blindness, difficulty walking, depression, unusual weight change, abnormal bleeding, enlarged lymph nodes, and angioedema.    Vital Signs:  Patient profile:   77 year old male Height:      71 inches Weight:      225.25 pounds BMI:     31.53 O2 Sat:      99 % on Room air Temp:     97.5 degrees F oral Pulse rate:   47 / minute BP sitting:   126 / 82  (left arm) Cuff size:   regular  Vitals Entered By: Randell Loop CMA (May 09, 2010 10:55 AM)  O2 Sat at Rest %:  99 O2 Flow:  Room air CC: 6 month ROV & review of mult medical problems... Is Patient Diabetic? No Pain Assessment Patient in pain? no      Comments no changes in meds today   Physical  Exam  Additional Exam:  WD, WN, 77 y/o BM in NAD... GENERAL:  Alert & oriented; pleasant & cooperative... HEENT:  [place]/AT, EOM-wnl, PERRLA, EACs-clear, TMs-wnl, NOSE-clear, THROAT-clear & wnl. NECK:  Supple w/ fairROM; no JVD; normal carotid impulses w/o bruits; no thyromegaly or nodules palpated; no lymphadenopathy. CHEST:  Clear to P & A; without wheezes/ rales/ or rhonchi heard... HEART:  Regular Rhythm; gr 1/6 SEM without rubs or gallops apprec... ABDOMEN:  Soft & nontender; normal bowel sounds; no organomegaly or masses detected. EXT: without deformities, mod arthritic changes; no varicose veins/ +venous insuffic/ tr edema. NEURO:  CN's intact; no focal neuro deficits... DERM:  No lesions noted; no rash etc...    MISC. Report  Procedure date:  05/09/2010  Findings:      BMP (METABOL)   Sodium                    142 mEq/L                   135-145   Potassium                 4.8 mEq/L                   3.5-5.1   Chloride                  110 mEq/L                   96-112   Carbon Dioxide            26 mEq/L                    19-32   Glucose                   91 mg/dL                    16-10   BUN                  [H]  26 mg/dL                    9-60   Creatinine           [H]  2.0 mg/dL  0.4-1.5   Calcium                   10.4 mg/dL                  9.1-47.8   GFR                  [L]  42.33 mL/min                >60.00   Lipid Panel (LIPID)   Cholesterol               180 mg/dL                   2-956   Triglycerides        [H]  170.0 mg/dL                 2.1-308.6   HDL                       57.84 mg/dL                 >69.62   LDL Cholesterol           96 mg/dL                    9-52   Impression & Recommendations:  Problem # 1:  CIGARETTE SMOKER (ICD-305.1) He states that he quit 8/11>  congrats, weight up 12# to 225# & cautioned about diet/ exercise/ preventing further weight gain...  Problem # 2:  HYPERTENSION (ICD-401.9) Controlled>   continue meds, get weight down... His updated medication list for this problem includes:    Amlodipine Besylate 5 Mg Tabs (Amlodipine besylate) .Marland Kitchen... 1 by mouth once daily    Losartan Potassium 50 Mg Tabs (Losartan potassium) .Marland Kitchen... Take 1 tab by mouth once daily...  Orders: TLB-BMP (Basic Metabolic Panel-BMET) (80048-METABOL) TLB-Lipid Panel (80061-LIPID)  Problem # 3:  HYPERLIPIDEMIA (ICD-272.4) Stable on the Simva40 + diet... His updated medication list for this problem includes:    Simvastatin 40 Mg Tabs (Simvastatin) .Marland Kitchen... Take 1 tab by mouth at bedtime...  Problem # 4:  Hx of METASTATIC PROSTATE CANCER (ICD-185) Followed by DrWrenn & his notes are reviewed...  Problem # 5:  RENAL INSUFFICIENCY (ICD-588.9) Stable mild renal insuffic...  Problem # 6:  DEGENERATIVE JOINT DISEASE (ICD-715.90) Aware> he knows to avoid NSAIDs... His updated medication list for this problem includes:    Aspirin 81 Mg Tbec (Aspirin) .Marland Kitchen... Take 1 tab by mouth once daily...  Problem # 7:  OTHER MEDICAL PROBLEMS AS NOTED>>>  Complete Medication List: 1)  Aspirin 81 Mg Tbec (Aspirin) .... Take 1 tab by mouth once daily.Marland KitchenMarland Kitchen 2)  Plavix 75 Mg Tabs (Clopidogrel bisulfate) .... Take 1 tab by mouth once daily.Marland KitchenMarland Kitchen 3)  Amlodipine Besylate 5 Mg Tabs (Amlodipine besylate) .Marland Kitchen.. 1 by mouth once daily 4)  Losartan Potassium 50 Mg Tabs (Losartan potassium) .... Take 1 tab by mouth once daily.Marland KitchenMarland Kitchen 5)  Simvastatin 40 Mg Tabs (Simvastatin) .... Take 1 tab by mouth at bedtime.Marland KitchenMarland Kitchen 6)  Multivitamins Tabs (Multiple vitamin) .... Take 1 tablet by mouth once a day   Patient Instructions: 1)  Today we updated your med list- see below.... 2)  Continue your current meds the same... 3)  Today we did your follow up metabolic panel & Lipid profile... please call the "phone tree" in a few days for your lab results.Marland KitchenMarland Kitchen  4)  Congrats on the smoking cessation >>> NOW WE HAVE TO WORK ON WEIGHT REDUCTION... get on that low carb, no sweets  diet!!! 5)  Call for any questions.Marland KitchenMarland Kitchen 6)  Please schedule a follow-up appointment in 6 months for your physical..Marland Kitchen

## 2010-07-06 ENCOUNTER — Ambulatory Visit: Payer: Self-pay | Admitting: Pulmonary Disease

## 2010-07-11 ENCOUNTER — Encounter: Payer: Self-pay | Admitting: Pulmonary Disease

## 2010-07-11 ENCOUNTER — Ambulatory Visit (INDEPENDENT_AMBULATORY_CARE_PROVIDER_SITE_OTHER): Payer: Medicare Other | Admitting: Pulmonary Disease

## 2010-07-11 DIAGNOSIS — E785 Hyperlipidemia, unspecified: Secondary | ICD-10-CM

## 2010-07-11 DIAGNOSIS — C61 Malignant neoplasm of prostate: Secondary | ICD-10-CM

## 2010-07-11 DIAGNOSIS — I10 Essential (primary) hypertension: Secondary | ICD-10-CM

## 2010-07-11 DIAGNOSIS — I6350 Cerebral infarction due to unspecified occlusion or stenosis of unspecified cerebral artery: Secondary | ICD-10-CM

## 2010-07-11 DIAGNOSIS — N259 Disorder resulting from impaired renal tubular function, unspecified: Secondary | ICD-10-CM

## 2010-07-11 DIAGNOSIS — M1990 Unspecified osteoarthritis, unspecified site: Secondary | ICD-10-CM

## 2010-07-20 NOTE — Assessment & Plan Note (Signed)
Summary: follow up/la   CC:  2 month ROV & review....  History of Present Illness: 77 y/o BM here for a follow up visit... he has multiple medical problems as noted below...    ~  November 04, 2009:  4 mo f/u & he states taking meds regularly now> BP controlled on Amlod & Losar;  Chol managed w/ diet + Simva40; & no TIAs or CP on the ASA/ Plavix... he is still smoking 1/2 to 1ppd unfortunately but he has lost 8# down to 212# today...    ~  May 09, 2010:  37mo ROV & he tells me he quit smoking 8/11 on his own "I just decided"... his only complaint= his arthritis but it is still fairly mild, uses Tylenol Prn & doesn't want stronger pain meds (he knows to avoid NSAIDs due to renal insuffic)... he has f/u Urology DrWrenn 11/11 (hx advanced prostate cancer 2005 treated w/ orchiectomy) w/ PSA= 0.72 (stable) & he noted 18-24 month PSA doubling time on observation... renal insuffic stable w/ Creat  ~2... he's already had the 2011 Flu vaccine.   ~  July 11, 2010:  49mo ROV- doing well, no new complaints or concerns, wants med refills for 30d supplies...  c/o arthritis pain & taking "advanced relief" from [facility] OTC "I read up on it- & it worked on my knee" (didn't bring bottle & reminded- no NSAIDs)... otherw stable- see prob list below; he has f/u DrWrenn in March...   Current Problem List:  OBSTRUCTIVE SLEEP APNEA (ICD-327.23) - hx prev ENT surgery by DrByers in 6/04...  CIGARETTE SMOKER (ICD-305.1) - Hx serious medical issues (prev stroke, HBP, etc) & he finally quit 8/11 he says.  ~  2/11: pt states <1ppd & he notes he can quit when he wants w/o help...  ~  CXR 2/11 showed tort vessels, lungs clear, thoracic spondylosis, NAD...  ~  6/11: still smoking 1/2 to 1ppd & not yet ready to quit, refuses smoking cessation help.  ~  12/11:  he reports quitting on his own 8/11...  HYPERTENSION (ICD-401.9) - on AMLODIPINE 5mg /d & LOSARTAN 50mg /d... he has Hx of HBP, HCVD, & hx of a small intracerebral  hemorrhage...  there is a long hx of non-compliance w/ his med regimen...  BP today = 140/80 & he states similar at home...  denies HA, fatigue, visual changes, CP, palipit, dizziness, syncope, dyspnea, edema, etc...  BRADYCARDIA (ICD-427.89) - pt was eval by DrBerry 7/10 for sinus bradycardia w/ rates in the 40's... he was asymptomatic w/ EKG showing LVH w/strain pattern; 2DEcho showing norm LVF; Cardiolite neg as well... decision made to continue observation & consider pacer if he becomes symptomatic...  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 prev on Foltx, but not currently taking any vitamins... advised regarding vitamin supplements needed.  HYPERLIPIDEMIA (ICD-272.4) - now on SIMVASTATIN 40mg /d, + diet Rx...  ~  FLP 11/07 showed TChol 224, TG 77, HDL 52, LDL 160... on diet alone- Vytorin 10-40 started...  ~  FLP 7/09 showed TChol 244, TG 217, HDL 45, LDL 167... not taking his Vytorin Rx.  ~  FLP 2/11 showed TChol 243, TG 140, HDL 61, LDL 168... not taking Vytorin, change to SIMVA40.  ~  FLP 6/11 on Simva40 showed TChol 177, TG 99, HDL 58, LDL 99... continue same.  ~  FLP 12/11 on Simva40 showed TChol 180, TG 170, HDL 50, LDL 96  Hx  of METASTATIC PROSTATE CANCER (ICD-185) - hx stage D Adenocarcinoma of the prostate w/ a large pelvic mass and bilat ureteral obstructions w/ renal failure (Creat= 12.2 & PSA >2600) Sep02... treated w/ orchiectomy and stents which were subsequently removed... his Creat improved to a baseline of  ~2, and his PSA's have improved to < 1 and stayed low... followed by DrWrenn regularly...  ~  Note from DrWrenn 10/10  indicates problems of Prostate Ca- s/p orchiectomy, renal insuffic w/ creat ~1.9, bladder neck contraction, & slowly rising PSA= 0.63 Jul10 w/ f/u Q70mo planned...  ~  Note from DrWrenn 11/11 indicated PSA= 0.72, stable...  RENAL INSUFFICIENCY (ICD-588.9) - baseline Creat after  recovery from the acute renal failure in 2002 has been  ~2.0...  ~  labs 7/09 showed BUN= 17, Creat= 1.6  ~  labs 2/11 showed BUN= 17, Creat= 1.9  ~  labs 6/11 showed BUN= 20, Creat= 1.8  ~  labs 12/11 showed BUN= 26, Creat= 2.0  DEGENERATIVE JOINT DISEASE (ICD-715.90) - eval by DrVoytek 3/10 for left shoulder pain, right knee pain- Dx= DJD & bursitis, given shot & MOBIC 7.5mg  (caution w/ his renal insuffic) but he prefers his horse linament & Tylenol Prn...  Hx of STROKE (ICD-434.91) - not taking his prev Aggrenox Rx- he stopped on his own in 2009 stating that it caused him to be drowsey & sluggish (symptoms improved off Rx)... he was hosp 1/06 w/ TIA- right sided numbness, tingling, weakness... treated w/ heparin, then Aggrenox & improved... CT showed atophy, sm lacunar infarcts bilat, & chr ischemic changes... 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  ~  2/11: we discussed getting on ASA 81mg /d & PLAVIX 75mg /d as substitiute for Aggrenox due to his vasc dis.  ~  12/11: stable on the ASA/ Plavix w/o TIAs etc...   Preventive Screening-Counseling & Management  Alcohol-Tobacco     Smoking Status: quit     Packs/Day: 1 ppd     Year Quit: oct/2011  Allergies (verified): No Known Drug Allergies  Comments:  Nurse/Medical Assistant: The patient's medications and allergies were reviewed with the patient and were updated in the Medication and Allergy Lists.  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 Father died age 57 w/ prostate cancer. Mother died age 84 w/ UTI, sepsis; otherw good health. 1 Sibling: Sister w/ DJD.  Social History: Reviewed history from 05/09/2010 and no changes required. Married, wife= [place], 73yrs. 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   Vital Signs:  Patient profile:   77 year old male Height:      71 inches Weight:      226.50 pounds BMI:     31.70 O2 Sat:      97 % on Room air Temp:     97.9 degrees  F oral Pulse rate:   50 / minute BP sitting:   140 / 80  (right arm) Cuff size:   regular  Vitals Entered By: Randell Loop CMA (July 11, 2010 9:55 AM)  O2 Sat at Rest %:  97 O2 Flow:  Room air CC: 2 month ROV & review... Is Patient Diabetic? No Pain Assessment Patient in pain? no      Comments no changes in meds today   Physical Exam  Additional Exam:  WD, WN, 77 y/o BM in NAD... GENERAL:  Alert & oriented; pleasant & cooperative... HEENT:  [place]/AT, EOM-wnl, PERRLA, EACs-clear, TMs-wnl, NOSE-clear, THROAT-clear & wnl. NECK:  Supple w/ fairROM; no JVD; normal carotid impulses w/o bruits; no thyromegaly or nodules palpated; no lymphadenopathy. CHEST:  Clear to P & A; without wheezes/ rales/ or rhonchi heard... HEART:  Regular Rhythm; gr 1/6 SEM without rubs or gallops apprec... ABDOMEN:  Soft & nontender; normal bowel sounds; no organomegaly or masses detected. EXT: without deformities, mod arthritic changes; no varicose veins/ +venous insuffic/ tr edema. NEURO:  CN's intact; no focal neuro deficits... DERM:  No lesions noted; no rash etc...    Impression & Recommendations:  Problem # 1:  CIGARETTE SMOKER (ICD-305.1) He  states he quit in Aug11 & has stayed quit!!!  Problem # 2:  HYPERTENSION (ICD-401.9) Controlled on meds>  continue same, take every day! His updated medication list for this problem includes:    Amlodipine Besylate 5 Mg Tabs (Amlodipine besylate) .Marland Kitchen... 1 by mouth once daily    Losartan Potassium 50 Mg Tabs (Losartan potassium) .Marland Kitchen... Take 1 tab by mouth once daily...  Problem # 3:  HYPERLIPIDEMIA (ICD-272.4) Stable>  continue same med... His updated medication list for this problem includes:    Simvastatin 40 Mg Tabs (Simvastatin) .Marland Kitchen... Take 1 tab by mouth at bedtime...  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  Problem # 5:  RENAL INSUFFICIENCY (ICD-588.9) Stable RI> pt reminded to avoid nephrotoxins including NSAIDs...  Problem # 6:  DEGENERATIVE JOINT DISEASE (ICD-715.90) This is his CC> taking OTC med from [facility]- asked to check & be sure there are no NSAIDs in this med or bring the bottle for [place] to review for him. His updated medication list for this problem includes:    Aspirin 81 Mg Tbec (Aspirin) .Marland Kitchen... Take 1 tab by mouth once daily...  Problem # 7:  Hx of STROKE (ICD-434.91) Stable on ASA/ Plavix w/o cerebral ischemic symptoms... His updated medication list for this problem includes:    Aspirin 81 Mg Tbec (Aspirin) .Marland Kitchen... Take 1 tab by mouth once daily...    Plavix 75 Mg Tabs (Clopidogrel bisulfate) .Marland Kitchen... Take 1 tab by mouth once daily...  Problem # 8:  OTHER MEDICAL PROBLEMS AS NOTED>>>  Complete Medication List: 1)  Aspirin 81 Mg Tbec (Aspirin) .... Take 1 tab by mouth once daily.Marland KitchenMarland Kitchen 2)  Plavix 75 Mg Tabs (Clopidogrel bisulfate) .... Take 1 tab by mouth once daily.Marland KitchenMarland Kitchen 3)  Amlodipine Besylate 5 Mg Tabs (Amlodipine besylate) .Marland Kitchen.. 1 by mouth once daily 4)  Losartan Potassium 50 Mg Tabs (Losartan potassium) .... Take 1 tab by mouth once daily.Marland KitchenMarland Kitchen 5)  Simvastatin 40 Mg Tabs (Simvastatin) .... Take 1 tab by mouth at  bedtime.Marland KitchenMarland Kitchen 6)  Multivitamins Tabs (Multiple vitamin) .... Take 1 tablet by mouth once a day  Patient Instructions: 1)  Today we updated your med list- see below.... 2)  We refilled your  meds for 2012... 3)  Call for any problems.Marland KitchenMarland Kitchen 4)  Keep your scheduled f/u appt in june for your physical... Prescriptions: SIMVASTATIN 40 MG TABS (SIMVASTATIN) take 1 tab by mouth at bedtime...  #30 x 12   Entered and Authorized by:   Michele Mcalpine MD   Signed by:   Michele Mcalpine MD on 07/11/2010   Method used:   Print then Give to Patient   RxID:   [id number] LOSARTAN POTASSIUM 50 MG TABS (LOSARTAN POTASSIUM) take 1 tab by mouth once daily...  #30 x 12   Entered and Authorized by:   Michele Mcalpine MD   Signed by:   Michele Mcalpine MD on 07/11/2010   Method used:   Print then Give to Patient   RxID:   [id number] AMLODIPINE BESYLATE 5 MG TABS (AMLODIPINE BESYLATE) 1 by mouth once daily  #30 x 12   Entered and Authorized by:   Michele Mcalpine MD   Signed by:   Michele Mcalpine MD on 07/11/2010   Method used:   Print then Give to Patient   RxID:   [id number] PLAVIX 75 MG TABS (CLOPIDOGREL BISULFATE) take 1 tab by mouth once daily...  #30 x 12   Entered and Authorized by:   Michele Mcalpine MD   Signed by:   Michele Mcalpine MD on 07/11/2010   Method used:   Print then Give to Patient   RxID:   [id number]

## 2010-08-17 LAB — POCT CARDIAC MARKERS
CKMB, poc: 1.1 ng/mL (ref 1.0–8.0)
CKMB, poc: 1.2 ng/mL (ref 1.0–8.0)
Myoglobin, poc: 163 ng/mL (ref 12–200)
Myoglobin, poc: 197 ng/mL (ref 12–200)
Troponin i, poc: <0.05 ng/mL (ref 0.00–0.09)
Troponin i, poc: <0.05 ng/mL (ref 0.00–0.09)

## 2010-08-17 LAB — BASIC METABOLIC PANEL
BUN: 19 mg/dL (ref 6–23)
CO2: 26 mEq/L (ref 19–32)
Calcium: 9.9 mg/dL (ref 8.4–10.5)
Chloride: 110 mEq/L (ref 96–112)
Creatinine, Ser: 1.89 mg/dL — ABNORMAL HIGH (ref 0.4–1.5)
GFR calc Af Amer: 43 mL/min — ABNORMAL LOW (ref >60)
GFR calc non Af Amer: 35 mL/min — ABNORMAL LOW (ref >60)
Glucose, Bld: 120 mg/dL — ABNORMAL HIGH (ref 70–99)
Potassium: 3.9 mEq/L (ref 3.5–5.1)
Sodium: 140 mEq/L (ref 135–145)

## 2010-08-17 LAB — DIFFERENTIAL
Basophils Absolute: 0 10*3/uL (ref 0.0–0.1)
Basophils Relative: 0 % (ref 0–1)
Eosinophils Absolute: 0.1 10*3/uL (ref 0.0–0.7)
Eosinophils Relative: 2 % (ref 0–5)
Lymphocytes Relative: 28 % (ref 12–46)
Lymphs Abs: 2.1 10*3/uL (ref 0.7–4.0)
Monocytes Absolute: 0.5 10*3/uL (ref 0.1–1.0)
Monocytes Relative: 7 % (ref 3–12)
Neutro Abs: 4.7 10*3/uL (ref 1.7–7.7)
Neutrophils Relative %: 63 % (ref 43–77)

## 2010-08-17 LAB — CBC
HCT: 39.9 % (ref 39.0–52.0)
Hemoglobin: 13.4 g/dL (ref 13.0–17.0)
MCHC: 33.7 g/dL (ref 30.0–36.0)
MCV: 86.7 fL (ref 78.0–100.0)
Platelets: 147 10*3/uL — ABNORMAL LOW (ref 150–400)
RBC: 4.61 MIL/uL (ref 4.22–5.81)
RDW: 15.9 % — ABNORMAL HIGH (ref 11.5–15.5)
WBC: 7.4 10*3/uL (ref 4.0–10.5)

## 2010-09-29 NOTE — Consult Note (Signed)
[name], [name]            ACCOUNT NO.:  [id number]   MEDICAL RECORD NO.:  [id number]          PATIENT TYPE:  INP   LOCATION:  3705                         FACILITY:  [facility]   PHYSICIAN:  Casimiro Needle L. Reynolds, M.D.DATE OF BIRTH:  October 31, 1932   DATE OF CONSULTATION:  06/12/2004  DATE OF DISCHARGE:                                   CONSULTATION   REQUESTING PHYSICIAN:  Lonzo Cloud. Kriste Basque, M.D.   CHIEF COMPLAINT:  TIA.   HISTORY OF PRESENT ILLNESS:  This is the initial in-patient consultation  evaluation of this 77 year old male with past medical history which includes  hypertension and previous stroke by imaging.  The patient reports that he  was at church at 2:00 yesterday when he experienced the sudden onset of  weakness in the right upper and lower extremities along with a numbness  sensation.  The patient also mentioned that other people told him he was  slurring his speech and was having difficulty using words that made sense.   He was brought emergently to the hospital. By the time he reached the  emergency room, his symptoms were much better and code stroke was not  issued.  However, the patient said that he did not feel completely right  until about 10:00 last p.m.   This morning, he feels back to his baseline and says that he is doing very  well.  He has been up walking around and denies any difficulty with eating,  denies ever having similar symptoms like this before.  He is on medication  for hypertension but admits that he had not taken it for several days prior  to admission.  He does take aspirin and says he does take this regularly.   PAST MEDICAL HISTORY:  Remarkable for hypertension as above.  It is unlikely  that this is in very good control.  Prostate cancer with previous  prostatectomy and orchiectomy.  He has had hyperlipidemia in the past, but  has not had it checked recently and is not on medications for it.  He has  known obstructive sleep apnea.   PAST  SURGICAL HISTORY:  Tonsillectomy and adenoidectomy along with  cholecystectomy.   FAMILY HISTORY/SOCIAL HISTORY/REVIEW OF SYSTEMS:  As outlined in the  admission H&P by Dr. Kriste Basque, and also in the accompanying resident's note.  Of note, he is an active smoker.  Review of systems as outlined in the  admission nursing records, remarkable for sleep apnea, corrective lenses.   MEDICATIONS PRIOR TO ADMISSION:  Only aspirin and Norvasc.  He takes a B-100  vitamin daily.   CURRENT MEDICATIONS:  In-hospital, he is also receiving intravenous heparin,  Protonix, Tylenol, Ambien.   PHYSICAL EXAMINATION:  VITAL SIGNS:  Temperature 98.8, blood pressure 244/98  on admission, now 158/76.  Pulse 44, respirations 18, O2 saturation 98% on  room air.  GENERAL:  This is a somewhat obese but otherwise healthy-appearing man,  lying supine in the hospital bed in no evident distress.  HEENT:  Head: Cranial nerves, normocephalic, atraumatic.  Oropharynx benign.  NECK:  Supple without carotid bruits.  CARDIOVASCULAR:  Regular rate and  rhythm without murmurs.   NEUROLOGIC:  Mental status:  He is awake, alert and fully oriented to time,  place and person. Recent and remote memory are intact. Attention span,  concentration, fund of knowledge are appropriate.  Speech is fluent and not  dysarthric. There are no defects to competitional naming and can repeat a  phrase.  Mood is euthymic and appropriate.  Cranial nerves, funduscopic exam  is benign.  Pupils equal and briskly reactive.  Extraocular movements full  without nystagmus.  Visual fields full to confrontation.  Hearing is intact  to conversational speech.  Facial sensation is intact to light tough. Face,  tongue and palate move normally and symmetrically.  Shoulder shrug strength  is normal.  Motor:  Normal tone and bulk, normal strength in all tested  extremity muscles.  Sensation intact to light touch in all extremities.  Coordination and rapid movements  are performed well.  Finger-nose is  adequate.  Gait:  He arises from the bed easily and has no difficulty in  ambulation. Reflexes diminished throughout. Toes are downgoing.   LABORATORY DATA:  CBC: White count 9.6, hemoglobin 12.5, platelets 214,000.  BMET remarkable for an elevated creatinine of 1.8. Liver enzymes are  unremarkable.  Hemoglobin A1c is minimally elevated at 6.2.  Fasting lipid  panel has not returned from this morning.  He had 1 in the past which showed  an elevated LDL of 127.  MRI of the brain was personally reviewed and  demonstrates extensive, very small bilateral lacunes in the thalamus base of  ganglia and subcortical white matter on each side. There are no large  cortical strokes, there is no acute finding. The MRA demonstrated fairly  severe stenosis in the distal M1 segment of the left MCA along with  bilateral PCA disease.   IMPRESSION:  1.  Left brain transient ischemic attack with transient right hemiparesis,      now resolved.  He has several risk factors including hypertension at      suboptimal control, tobacco abuse and probable hyperlipidemia.  2.  Obstructive sleep apnea.  3.  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   RECOMMENDATIONS:  Would discontinue heparin, would change his aspirin to  Aggrenox. He is to have his lipids treated aggressively.  Will also check a  fasting homocysteine level.  He needs routine stroke work-up including  carotid transcranial Dopplers and a 2-D echocardiogram.  The transcranial  Dopplers will help to define how clinically significant the left MCA  stenosis is, although I would not recommend intervention on that at this  time, given that he is going to derive more benefit from better control of  his risk factors.  Will initiate a smoking cessation consult.  Stroke service to follow.  Thank you for the consultation.       MLR/MEDQ  D:  06/12/2004  T:  06/12/2004  Job:  [id number]

## 2010-09-29 NOTE — Consult Note (Signed)
[facility]  Patient:    [name], [name] Visit Number: [id number] MRN: [id number]          Service Type: MED Location: [phone] Attending Physician:  Verdene Lennert Dictated by:   Excell Seltzer. Annabell Howells, M.D. Proc. Date: 01/29/01 Admit Date:  01/29/2001   CC:         Lorin Picket M. Kriste Basque, M.D. [name]   Consultation Report  CHIEF COMPLAINT:  Elevated creatinine and PSA.  HISTORY OF PRESENT ILLNESS:  The patient is a 77 year old black male that was first found to have a PSA elevation of 10 in January 2000.  He failed to show for a urology appointment and then failed followup with Dr. Kriste Basque.  He was recently in for what I believe was routine physical exam and was found to have a creatinine of 11, a PSA of 135, his potassium was only 5.6.  His only symptom is urinary frequency.  He denies prior GU history.  PAST MEDICAL HISTORY: 1. No drug allergies. 2. Current medications include two antihypertensives - he is not sure what and    a daily aspirin. 3. Medical history: Hypertension with LVH. 4. Surgical history: Cholecystectomy.  SOCIAL HISTORY:  He has a 45 pack-year smoking history, quit in March 2002. He denies alcohol.  He is retired.  FAMILY HISTORY:  His father died at 11 of prostate cancer.  REVIEW OF SYSTEMS:  He has had urinary frequency but no hesitancy or diminished drainage.  He denies anorexia or weight loss.  He denies bone pain. He denies numbness, weakness, shortness of breath, or chest pain.  He has had no hematuria.  PHYSICAL EXAMINATION:  VITAL SIGNS:  Temperature 97.1, blood pressure is 151/73, pulse 51, respirations 20.  GENERAL:  He is a well-developed, well-nourished, black male in no acute distress, alert and oriented x 3.  HEENT:  Head and face normocephalic, atraumatic.  NECK:  Supple without thyromegaly or bruit.  He has no cervical, axillary, or inguinal adenopathy.  LUNGS:  Clear to auscultation.  HEART:   Regular rate and rhythm.  ABDOMEN:  Soft, flat, nontender, without mass, hepatosplenomegaly, CVA tenderness or hernia.  GENITOURINARY:  Unremarkable phallus with an adequate meatus.  Scrotum is unremarkable.  Testicles bilaterally descended, normal in size and consistency without mass or tenderness.  There is small soft right hydrocele and slight induration of the right epididymis.  Anus and perineum without lesion.  RECTAL:  Normal sphincter tone.  Prostate is 2+ in size, slightly asymmetric but without distinct nodules or grittiness.  Seminal vesicle nonpalpable.  No rectal masses are noted.  EXTREMITIES:  Full range of motion without edema.  NEUROLOGIC:  Grossly intact.  SKIN:  Warm and dry.  LABORATORY DATA:  His urine today was clear.  An abdominal ultrasound revealed bilateral hydro with a 10 cm pelvic mass.  The bladder was not visualized either because it was empty or the lumen was filled with this mass.  Bilateral percutaneous nephrostomy tubes were placed earlier today.  IMPRESSION: 1. Prostate cancer with obstructive uropathy. 2. Acute renal failure. 3. Hypertension.  RECOMMENDATIONS: 1. Check testosterone level. 2. Bone scan. 3. CT of the abdomen and pelvis without IV contrast. 4. Will need prostate biopsy and possible orchiectomy versus Lupron injections    in the future. 5. Will let renal function stabilize prior to intervention. Dictated by:   Excell Seltzer. Annabell Howells, M.D. Attending Physician:  Verdene Lennert DD:  01/29/01 TD:  01/30/01 Job: [id number]

## 2010-09-29 NOTE — Discharge Summary (Signed)
[name], [name]            ACCOUNT NO.:  [id number]   MEDICAL RECORD NO.:  [id number]          PATIENT TYPE:  INP   LOCATION:  3705                         FACILITY:  [facility]   PHYSICIAN:  Lonzo Cloud. Kriste Basque, M.D. [facility] BIRTH:  1932/08/02   DATE OF ADMISSION:  06/11/2004  DATE OF DISCHARGE:  06/14/2004                                 DISCHARGE SUMMARY   FINAL DIAGNOSES:  1.  Admitted 06/11/04 via the emergency room with a transient ischemic attack      and symptoms consisting of numbness and tingling in his right arm and      right leg associated with some weakness, incoordination and slurring of      speech.  Symptoms cleared rapidly with intravenous heparin and he was      seen by the stroke team and converted to Aggrenox.  CT showed atrophy,      small lacunar infarcts bilaterally, and chronic ischemic changes.  MRI      confirmed this and MRA showed moderate stenosis in the left middle      cerebral artery at the bifurcation and changes bilaterally in the      posterior cerebral artery as well.  2.  Hypertension and hypertensive cardiovascular disease with history of      noncompliance with medical regimen.  3.  History of syncope 11/04 with hospitalization at [facility] and no      etiology being identified.  4.  History of hypercholesterolemia with cholesterol 249, triglycerides 449,      HDL 38, and LDL not calculated this admission.  5.  History of elevated homocysteine level with 23 with Foltx started this      admission.  6.  History of smoking with smoking cessation consult this admission.  7.  History of obstructive sleep apnea with previous ENT surgery by Dr.      Jearld Fenton in 6/04.  8.  History of stage D adenocarcinoma of the prostate with large pelvic mass      identified in 2002.  At that time, he had bilateral ureteral      obstructions and renal failure with a creatinine of 12.2.  His prostate      specific antigen was over 2600.  He was treated by Dr. Annabell Howells  with an      orchiectomy, cysto and bilateral ureteral stents.  He continues followup      with Dr. Annabell Howells on a regular basis.  PSA this admission 0.2.  9.  Urinary tract infection - no organism specified, treated with Cipro this      admission.  10. Status post cholecystectomy in 1983.  11. Status post right inguinal hernia repair in 2000.   BRIEF HISTORY AND PHYSICAL:  The patient is a 77 year old gentleman well  known to me with history above.  He was last seen in the office in 11/04,  however.  He presented to the emergency room on 06/11/04 stating that he was  in his usual state of health until about noon on that day.  After church, he  was at [employer] with his family and  experienced sudden onset of right-  sided numbness and tingling predominantly in his hand and right leg.  This  was associated with some weakness and incoordination of the right hand.  He  also noticed some slurring of speech.  Symptoms lasted 5-10 minutes and  started to clear spontaneously.  He had some residual numbness present on  arrival in the emergency room.  He was seen by the ER physician and CT scan  was obtained.  This revealed atrophy, bilateral lacunar infarcts and small  vessel disease.  He was referred for admission.   PAST MEDICAL HISTORY:  The patient was last hospitalized 11/04 at [facility]  for a syncopal spell.  No cause was found.  There was cardiac consultation  with Dr. Dietrich Pates.  During that admission, a CT revealed old bilateral  basilar ganglia lacunar infarcts and small vessel disease.  His carotid  Dopplers were negative.  He has a history of hypertension and had been  controlled on Norvasc.  He had a Cardiolite in 11/04 that showed no ischemia  or wall motion abnormalities.  The patient has been noncompliant with his  medical regimen.  He is a smoker.  He had quit in the past but restarted.  He has a history of mild hypercholesterolemia and was previously on Zocor  but the patient  discontinued this as well.  He has a history of obstructive  sleep apnea and had sinus surgery by Dr. Jearld Fenton in 6/04.  He has a history of  stage D adenocarcinoma of the prostate with presentation in 2002.  At that  time, he had a 12-cm pelvic mass but remarkably had no lymphadenopathy or  distant mets.  He had bilateral ureteral obstructions with a creatinine of  12.2 and a PSA of over 2600 at that time.  He was seen by Dr. Annabell Howells and had  a bilateral orchiectomy and initially treated with percutaneous nephrostomy  tubes and subsequently treated with bilateral ureteral stents.  His  creatinine improved to the 2 range.  He continues followup with Dr. Annabell Howells.  He has a history of remote gallbladder surgery in 2003 and an inguinal  hernia repair on the right side in 2000.  He had tonsillectomy and  adenoidectomy as a child.   ALLERGIES:  HE IS ALLERGIC TO PENICILLIN.   PHYSICAL EXAMINATION:  His examination at the time of admission revealed a  77 year old gentleman in no acute distress.  Blood pressure 180/100, pulse  62 per minute and regular, respirations 20 per minute and unlabored, O2  saturation 99% on room air, temperature 98 degrees.  HEENT exam was  unremarkable.  Neck exam showed no jugular venous distention, no carotid  bruits, no thyromegaly or lymphadenopathy.  Chest exam was clear to  percussion and auscultation.  Cardiac exam revealed a regular rhythm with a  3/6 systolic ejection murmur along the left sternal border, no rubs or  gallops detected.  Abdomen was soft and nontender without evidence of  organomegaly or masses.  GU exam showed previous orchiectomy for prostate  cancer.  Rectal was deferred.  Extremities showed no clubbing, cyanosis or  edema.  Neurologic exam revealed some right-sided numbness but strength was  intact, reflexes were normal and there was regular Babinski's sign.  Skin  exam was negative.  LABORATORY DATA:  12 lead EKG on admission revealed sinus  rhythm, PACs,  nonspecific ST-T wave changes.  2-D echo showed overall normal left  ventricular systolic function with ejection fraction estimated from 55-65%;  no left ventricular regional wall motion abnormalities; moderately increased  left ventricular wall thickness with some disproportion involving the  __________ .  Chest x-ray showed heart within normal limits and size, the  aorta was uncoiled, lungs were clear.  CT of the brain showed atrophy and  small vessel ischemic disease with small bilateral lacunar infarcts  unchanged from previous scan in 11/04 from [facility].  MRI of the brain  showed mild ischemic changes as noted and the MRA showed moderate to severe  stenosis of the left middle cerebral artery bifurcation and bilateral  posterior cerebral artery disease.   Hemoglobin 13.2, hematocrit 39.3, white count 8400 with a normal  differential.  Sed rate 25.  Pro time 13.0, INR 1.0, PTT 31 seconds.  The  patient was placed on heparin during part of his hospital course and this  was followed by Pharmacy.  Sodium 142, potassium 4.3, chloride 113, CO2 28,  BUN 20, creatinine 1.8, blood sugar 99.  AST 15, ALT15, alk phos 115, total  bilirubin 0.4.  Hemoglobin A1c 6.2.  Homocysteine 23.  CPK 99, negative MB  and negative troponin.  Fasting cholesterol 249, triglycerides 449, HDL 38.  TSH 1.04.  PSA 0.20.  Urinalysis showed moderate leukocyte esterase, white  cells and a few bacteria.   HOSPITAL COURSE:  The patient was initially placed on IV heparin for stroke  and TIA.  He was placed back on his oral Norvasc and blood pressures  followed carefully.  Other labs are as noted above.  The patient was seen by  the stroke team.  They recommended switching the heparin to Aggrenox,  starting with one and increasing to two pills a day.  Transcranial Doppler  ultrasound was ordered and results are pending at the time of this  dictation.  2-D echo as above.  He received smoking cessation  training here  in the hospital and started on Vytorin 10/40 one p.o. daily for his  cholesterol and given Cipro for what appeared to be a mild urinary tract  infection.  He responded nicely to all these interventions.  He seemed to  tolerate the Aggrenox well.  Blood pressure 140/84 at discharge on the  Norvasc.   DISCHARGE MEDICATIONS:  1.  Aggrenox one tablet daily for 2 weeks, preceded by two Tylenol and then      change to one tablet p.o. b.i.d.  2.  Felodipine 10 mg p.o. daily.  3.  Doxazosin 8 mg p.o. daily.  4.  Vytorin 10/40 one tablet p.o. q.h.s.  5.  Foltx vitamin for his homocysteine, one tablet p.o. daily.  6.  Cipro 250 mg p.o. b.i.d. for 5 more days until gone.  7.  Tylenol p.r.n. pain.   The patient was instructed to discontinue all smoking, take his medications  regular and follow up in the office with me on Wednesday, 06/21/04 at 1:45  p.m. at which time he is to bring all of his medications for review.  CONDITION ON DISCHARGE:  Improved.      SMN/MEDQ  D:  06/14/2004  T:  06/14/2004  Job:  [id number]

## 2010-09-29 NOTE — Op Note (Signed)
NAME:  JULIO, ZAPPIA                      ACCOUNT NO.:  [id number]   MEDICAL RECORD NO.:  [id number]                   PATIENT TYPE:  AMB   LOCATION:  [facility]                                 FACILITY:  [facility]   PHYSICIAN:  Excell Seltzer. Annabell Howells, M.D.                 DATE OF BIRTH:  1932/12/22   DATE OF PROCEDURE:  03/12/2002  DATE OF DISCHARGE:                                 OPERATIVE REPORT   PROCEDURE:  1. Cystoscopy.  2. Removal of left ureteral stent.  3. Left retrograde pyelograms with interpretation.  4. Right ureteroscopy with manipulation/removal of right ureteral stent with     right retrograde pyelogram with interpretation.   PREOPERATIVE DIAGNOSIS:  Prostate cancer with history of bilateral ureteral  obstruction and stents.   POSTOPERATIVE DIAGNOSIS:  Prostate cancer with history of bilateral ureteral  obstruction and stents with migration of the right stent proximally into the  distal ureter.   SURGEON:  Excell Seltzer. Annabell Howells, M.D.   ANESTHESIA:  General.   SPECIMENS:  Right and left ureteral stents.   COMPLICATIONS:  None.   INDICATIONS:  Mr. Krichbaum is a 77 year old black male with advanced  prostate cancer, who was initially diagnosed with renal failure from  bilateral ureteral obstruction from a large prostatic mass.  He has been  treated with orchiectomy and has had excellent response with significant  reduction in size of the mass from over 10 cm to approximately 3.5 cm.  It  was felt that an attempt to get out the bilateral stents to see if he would  be able to maintain his renal function without the need for the ureteral  catheters was felt to be worthwhile.   FINDINGS AND PROCEDURE:  The patient was given p.o. Diflucan and Levaquin  preoperatively.  He was taken to the operating room where a general  anesthetic was induced.  He was placed in the lithotomy position.  A B&O  suppository was inserted.  He was prepped with Betadine solution and draped  in the usual  sterile fashion.  Cystoscopy was performed using the 22 [place]  scope and the 12 degree lens.  Examination revealed a normal urethra and  intact external sphincter.  The prostatic urethra had trilobar hyperplasia  with some degree of obstruction.  However, there was no evidence of the  previously noted midline trigonal mass from his prostate cancer.  The left  ureteral catheter was identified.  It was grasped in alligator forceps and  removed without difficulty.  A retrograde pyelogram was then performed on  the left.  There was J-hooking of the ureter in this area, but the contrast  flowed freely proximally.  The left kidney was noted to have poor function,  and the contrast persisted throughout the procedure, but that is not a  surprise due to the poor function on that side.  The right ureteral stent  tip was not identified within the  bladder.  On fluoroscopy, it appeared that  it was in the distal ureter.  I attempted to pass a Pfister short stone  basket up the ureter, but this was not possible due to J-hooking of the  ureter.  I then removed the cystoscope and passed a 6 French short  ureteroscope per urethra.  The right ureteral orifice was identified, and a  guidewire was then passed through the true lumen of the ureter to the  kidney.  Over this wire, I was able to negotiate the ureteroscope, allowing  identification of the stent in the distal ureter.  The tip of the stent was  buried into the wall of the ureter.  Attempts were made to remove the stent  with a flat wire stone basket as well as three-prong grasping forceps.  These were unsuccessful due to the inability to engage the tip of the stent  with the basket and the inability of the three-pronged graspers to get a  good purchase on the 8.5 French catheter.  However, after approximately 40  minutes of manipulation and effort, I was eventually able to tease the end  of the stent out with the tip of the ureteroscope into the  bladder.  Additionally, prior to this, an attempt was made to removed the stent by  blowing up a 10 cm 15 French balloon alongside the stent, but this was also  unsuccessful.  Once the tip of the stent was in the bladder, it was easily  removed using the cystoscope and grasping forceps.  Some contrast was then  instilled in the ureter on the right, but I was unable to get much in due to  the J-hooking in the distal ureter.  However, there was no difficulty  passing the ureteroscope once I overcame the angulation, and it did not  appear to be significantly obstructed.  At this point, I made the decision  to leave both stents out with the plan to follow him very closely for the  need for replacement.  The patient's bladder was drained, and the cystoscope  was removed.  He was taken down from lithotomy position.  His anesthetic was  reversed.  He was moved to the recovery room in stable condition.  There  were no complications.                                               Excell Seltzer. Annabell Howells, M.D.    JJW/MEDQ  D:  03/12/2002  T:  03/12/2002  Job:  [id number]   cc:   Cecille Aver, M.D.

## 2010-09-29 NOTE — Discharge Summary (Signed)
   NAME:  [name], [name]                      ACCOUNT NO.:  [id number]   MEDICAL RECORD NO.:  [id number]                   PATIENT TYPE:  INP   LOCATION:  A208                                 FACILITY:  [facility]   PHYSICIAN:  Vania Rea, M.D.              DATE OF BIRTH:  1932/06/17   DATE OF ADMISSION:  03/21/2003  DATE OF DISCHARGE:  03/23/2003                                 DISCHARGE SUMMARY   PRIMARY CARE PHYSICIAN:  Dr. Lorin Picket with the [facility]   DISCHARGE DIAGNOSES:  1. Syncopal episode, myocardial infarction ruled out.  2. Renal insufficiency.  3. Asymptomatic bradycardia.  4. Hypertension, controlled.  5. Tobacco abuse.   DISCHARGE CONDITION:  Stable.   DISPOSITION:  To home.   DISCHARGE MEDICATIONS:  1. Norvasc 10 mg daily.  2. Zocor 20 mg every evening.  3. Aspirin 81 mg daily.  4. Hydrochlorothiazide 75 mg daily.   HOSPITAL COURSE:  Please refer to the history and physical of November 7.  This is a 77 year old man who had a syncopal episode in church.  He denies  antecedent factors and denied chest pain.  He was admitted and myocardial  infarction was ruled out by enzymes, by EKG, and finally by Cardiolite  stress test.  his blood pressure on admission was 187/94 with a pulse of 88.  He was started on Norvasc and later on in his admission his pulse remained  in the 40s.  Although there was a significant rise in his pulse, I did see  any significant drop in his systolic pressure.  He remained asymptomatic.  His serum creatinine was noted to be 2.2 on admission.  Since the cause of  the patient's syncope has been determined so far to be probably noncardiac  the patient is discharged to be followed up by his primary care physician  Dr. Lorin Picket with the [facility].  He is also to make an appointment  with 1 of the [facility] group for further workup of his  bradycardia.  His primary care is to follow him for the renal insufficiency.     ___________________________________________                                         Vania Rea, M.D.   LC/MEDQ  D:  03/23/2003  T:  03/23/2003  Job:  [id number]

## 2010-09-29 NOTE — Op Note (Signed)
NAME:  [name], [name]                      ACCOUNT NO.:  [id number]   MEDICAL RECORD NO.:  [id number]                   PATIENT TYPE:  OIB   LOCATION:  2106                                 FACILITY:  [facility]   PHYSICIAN:  Suzanna Obey, M.D.                    DATE OF BIRTH:  [birth year]   DATE OF PROCEDURE:  DATE OF DISCHARGE:                                 OPERATIVE REPORT   PREOPERATIVE DIAGNOSIS:  Obstructive sleep apnea, deviated septum, turbinate  hypertrophy.   POSTOPERATIVE DIAGNOSIS:  Obstructive sleep apnea, deviated septum,  turbinate hypertrophy.   OPERATION PERFORMED:  Septoplasty, submucous resection of inferior  turbinates, uvulopalatopharyngoplasty and tonsillectomy.   SURGEON:  Suzanna Obey, M.D.   ANESTHESIA:  General endotracheal.   ESTIMATED BLOOD LOSS:  Approximately 10mL.   INDICATIONS FOR PROCEDURE:  The patient is a 77 year old who has had  obstructive sleep apnea diagnosed by sleep study.  He has obstructive sleep  apnea symptoms.  He has failed CPAP and now wants to proceed with surgery  knowing that its success rate in eliminating the apnea is approximately 50%.  He was informed of the risks and benefits of the procedure including  bleeding, infection, velopharyngeal insufficiency, change in the voice,  perforation of the septum, change in the external appearance of the nose,  chronic crusting and drying, and the risks of the anesthetic.  All questions  were answered and consent was obtained.   DESCRIPTION OF PROCEDURE:  The patient was taken to the operating room and  placed in supine position.  After adequate general endotracheal tube  anesthesia, he was prepped and draped in the usual sterile manner.  Oxymetazoline pledgets were placed in the nose bilaterally and injection of  the septum with 1% lidocaine with 1:100,000 epinephrine and the inferior  turbinates and septum.  Right hemitransfixion incision was performed,  raising a mucoperichondrial  and mucoperiosteal flap.  Cartilage was divided  about 1.5 cm posterior to the caudal strut.  and this cartilage was removed  with a Therapist, nutritional. The inferior cartilage was removed which was creating  a spur anteriorly.  The bone was then removed with a Jansen-Middleton  forceps, removing a posterior spur.  This seemed to correct the septal  deflection.  The turbinates were then infractured, a midline incision made  with a 15 blade, mucosal flap elevated superiorly and inferior mucosa and  bone were removed with a turbinate scissors.  The edge was cauterized with  suction cautery and the flap was laid back down over the raw surface and  both turbinates were outfractured.  Hemitransfixion incision closed with  interrupted 4-0 chromic and a 4-0 plain gut quilting stitch placed through  the septum.  Telfa rolls soaked in bacitracin were placed into the nose and  secured with 3-0 nylon.  The Crowe-Davis was then inserted, retracted and  suspended from the [facility] stand.  The uvulopalatopharyngoplasty was  started by  making an incision just at the base of the uvula connection to the palate  and incision was made into the left anterior tonsillar pillar.  The capsule  of the tonsil was identified and the dissection was carried down through the  left tonsil along its capsule.  The incision was then extended across to the  right again into the anterior tonsillar pillar and dissection was performed  using the electrocautery to remove the tonsil along its capsule.  The two  tonsils and uvula were all removed in an en bloc specimen.  The palate and  tonsillar fossae were then closed with interrupted 3-0 Vicryl sutures after  obtaining hemostasis and suction cautery.  He had a much nicer opening in  his  palate and pharynx region.  Irrigation was performed.  Hypopharynx,  esophagus and stomach were suctioned with an NG tube.  The Crowe-Davis was  released and resuspended.  There was hemostasis present  in all locations.  The patient was then awakened and brought to recovery in stable condition.  Counts correct.                                                Suzanna Obey, M.D.    Cordelia Pen  D:  11/04/2002  T:  11/05/2002  Job:  [id number]   cc:   Lonzo Cloud. Kriste Basque, M.D. [facility]

## 2010-09-29 NOTE — Op Note (Signed)
[name]. [facility]  Patient:    [name], [name] Visit Number: [id number] MRN: [id number]          Service Type: MED Location: [id number] Attending Physician:  Verdene Lennert Dictated by:   Excell Seltzer. Annabell Howells, M.D. Proc. Date: 02/06/01 Admit Date:  01/29/2001   CC:         Lorin Picket M. Kriste Basque, M.D. [facility]   Operative Report  PREOPERATIVE DIAGNOSIS:  Prostate cancer with bilateral ureteral obstruction.  POSTOPERATIVE DIAGNOSIS:  Prostate cancer with bilateral ureteral obstruction  OPERATION PERFORMED:  Cystoscopy and bilateral scrotal orchiectomy.  SURGEON:  Excell Seltzer. Annabell Howells, M.D.  ANESTHESIA:  General.  SPECIMENS:  Testicles.  COMPLICATIONS:  None.  INDICATIONS FOR PROCEDURE:  [name] is a 77 year old black male who presented with a creatinine of 12, was found to have a PSA of 135 and a 10 cm pelvic mass which on biopsy has proven to be a Gleason 7 adenocarcinoma of the prostate causing bilateral ureteral obstruction.  He has bilateral PERCs in and his creatinine has fallen into the 3.5 range.  He is to undergo bilateral orchiectomy for treatment of his prostate cancer and cystoscopy for assessment of the feasibility of internalizing his percutaneous nephrostomy tubes to stents.  DESCRIPTION OF PROCEDURE:  The patient was taken to the operating room where a general anesthetic was induced.  He was placed in the lithotomy position. His scrotum was shaved.  His perineum and genitalia were prepped with Betadine solution and he was draped in the usual sterile fashion.  Cystoscopy was performed using a 22 [place] scope and the 12 and 70 degree lenses. Examination revealed a normal urethra.  The external sphincter was intact. There was some mild lateral lobe enlargement of the prostate but there was no significant obstruction from this.  At the bladder neck, there was a mass arising submucosally that extended into the bladder.  It was most prominent  on the left but also extended onto the right.  This mass made entry into the bladder difficult to impossible.  I was unable to identify the ureteral orifices because of the mass which seemed to fill a good portion of the bladder.  At this point the cystoscope was removed.  An anterior midline scrotal incision was made with a knife.  The right testicle was delivered from the wound with a Bovie.  The vascular pedicle and vas were divided into two packets.  The testicle was removed.  Each packet was double ligated with a 0 Vicryl tie.  Once the right testicle had been removed, the procedure was repeated on the left side.  Once the testicle was removed and the hemostasis was secured, the deep dartos muscle was closed with a running 3-0 chromic, with care being taken to include the median midline raphe musculature.  The skin was then closed with interrupted vertical mattress 3-0 chromic sutures. At this point the patient was taken down from lithotomy position.  A dressing of 4 x 4s, fluffs, Kerlix and athletic supporter was placed.  The patients anesthetic was reversed and he was removed to the recovery room in  stable condition.  There were no complications. Dictated by:   Excell Seltzer. Annabell Howells, M.D. Attending Physician:  Verdene Lennert DD:  02/06/01 TD:  02/06/01 Job: [id number]

## 2010-09-29 NOTE — H&P (Signed)
NAME:  [name], [name]                      ACCOUNT NO.:  [id number]   MEDICAL RECORD NO.:  [id number]                   PATIENT TYPE:  INP   LOCATION:  A208                                 FACILITY:  [facility]   PHYSICIAN:  Melvyn Novas, MD        DATE OF BIRTH:  Feb 24, 1933   DATE OF ADMISSION:  03/21/2003  DATE OF DISCHARGE:                                HISTORY & PHYSICAL   The patient is a 77 year old black man who was in church today.  He said  that he felt a little queasy in his lower stomach, sat down, then stood up  walked around and then had a syncopal episode. He does remember hitting the  ground. He had no antecedent chest tightness or palpitations prior to the  syncopal episode. There was no reported seizure activity.  He specifically  denied nausea or diaphoresis.  He denies any chest pain in the ER.   The patient was evaluated, had mildly nonspecific ST abnormalities of his  ECG and was subsequently admitted.  His blood pressure was suboptimally  controlled.  His lipid status is unknown.   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   PAST SURGICAL HISTORY:  Past surgical history is remarkable for  prostatectomy and orchiectomy 2 years ago.  PSA was normal 6 months ago.  Tonsillectomy and cholecystectomy.   SOCIAL HISTORY:  He smoked 1 pack per day for 35 years.  He is a retired  Consulting civil engineer.  He is married.   ALLERGIES:  He has no known allergies.   CURRENT MEDICATIONS:  A blood pressure 10 mg, I believe that it is Norvasc.   PHYSICAL EXAMINATION:  VITAL SIGNS:  Blood pressure is 187/94, pulse is 88  and regular. He is afebrile.  Respiratory rate is 18.  HEAD:  Normocephalic, atraumatic.  EYES:  PERLA.  Extraocular movements intact.  Sclerae anicteric.  Conjunctivae are pink.  NECK:  Neck shows  no JVD.  No carotid bruits. No thyromegaly.  LUNGS:  Lungs show prolonged expiratory phase.  No rales, wheezes, or  rhonchi appreciable.  HEART:  Regular rhythm.  No murmurs, gallops, heaves, thrills, or rubs  appreciable.  ABDOMEN:  Soft, nontender, bowel sounds normoactive.  No guarding, rebound,  masses, or organomegaly.  EXTREMITIES:  No clubbing, cyanosis, or edema.  NEUROLOGIC:  Cranial nerves II-XII grossly intact.  The patient moves all 4  extremities.  Plantars are downgoing bilaterally.   IMPRESSION:  1. Syncope, possibly vasovagal.  2. Uncontrolled hypertension.  3. Nonspecific ST abnormalities per ECG.  4. Lipid status unknown.  5. Status post CVA per CAT scan today in the ER.  6. Smoker for 35 years.   PLAN:  The plan is to admit the patient for observation, put him on aspirin.  Should get a lipid  profile, repeat 12-lead cardiogram in the morning, add  second blood pressure medicine if blood pressure does not resolve once on  the floor.  Schedule exercise tolerance test prior to discharge to rule out  any occult syncopal component and I will make further recommendations as the  data base expands.     ___________________________________________                                         Melvyn Novas, MD   RMD/MEDQ  D:  03/21/2003  T:  03/21/2003  Job:  [id number]

## 2010-09-29 NOTE — Discharge Summary (Signed)
[place]  Patient:    [name], [name] Visit Number: [id number] MRN: [id number]          Service Type: MED Location: [id number] Attending Physician:  Verdene Lennert Dictated by:   Lonzo Cloud. Kriste Basque, M.D. [facility] Admit Date:  01/29/2001 Disc. Date: 02/08/01   CC:         Excell Seltzer. Annabell Howells, M.D.  Garnetta Buddy, M.D.   Discharge Summary  DATE OF BIRTH:  [birth year]  FINAL DIAGNOSES: 1. Admitted on January 29, 2001, with acute renal failure (creatinine 12.2)    and obstructive uropathy secondary to locally advanced prostate cancer with    prostatic specific antigen 2648. 2. Renal failure treated with bilateral percutaneous nephrostomy tubes.    Creatinine improved to 3.2 at discharge. 3. Adenocarcinoma of the prostate with 12 x 10 cm mass on CT and no    adenopathy identified; negative bone scan with bony metastasis seen;    needle biopsy positive for moderately differentiated adenocarcinoma    consistent with prostatic adenocarcinoma; Gleason score 7 (3 + 4);    treated with bilateral orchiectomy on February 06, 2001, by Excell Seltzer. Annabell Howells,    M.D., with follow-up by him. 4. Hypertension with hypertensive cardiovascular disease. 5. Anemia. 6. Status post cholecystectomy in 1983. 7. History of right inguinal hernia repair in 2000 by Rose Phi. Maple Hudson, M.D. 8. Ex-smoker; quit in 2001.  BRIEF HISTORY OF PRESENT ILLNESS:  The patient is a 77 year old gentleman admitted with probable prostate cancer, obstructive uropathy, and acute renal failure.  He had last been seen in January of 2000 with a history of hypertension and LVH on Accupril and Norvasc.  At that time, his BUN was 15, creatinine 1.5, and the PSA was 10.1.  He was referred to Excell Seltzer. Annabell Howells, M.D., for urologic evaluation and biopsy of suspected prostatic cancer.  The patient did not keep that appointment and did not return for follow-up with Excell Seltzer. Annabell Howells, M.D., or myself.  He is very stoic  and denied any acute symptoms when he was brought into the office on January 28, 2001, by his wife for a general medical checkup.  On specific questioning, he did note some vague urinary incontinence and frequency, but he denied decreased size of stream, pain, or discomfort.  On exam, his blood pressure was 150/100, BUN 115, creatinine 11.0, and PSA over 135.  The patient was admitted for evaluation and treatment of suspected obstructive uropathy with acute renal failure.  PAST MEDICAL HISTORY:  The patients past medical history includes hypertension with LVH.  He had been on Accupril and Norvasc, but had stopped these over a year prior to this admission.  He had a history of some mild aortic insufficiency.  He had a previous cholecystectomy in 1983.  He had a right inguinal hernia repair by Rose Phi. Maple Hudson, M.D., in 2000.  He is an ex-smoker and quit in 2001.  PHYSICAL EXAMINATION:  A 77 year old man in no acute distress.  VITAL SIGNS:  Blood pressure 150/100, pulse 60 per minute and regular, respirations 22 per minute and not labored, temperature 98.6 degrees.  HEENT:  Unremarkable.  NECK:  No jugular venous distention.  No carotid bruits.  No thyromegaly.  No lymphadenopathy.  CHEST:  Clear to auscultation and percussion.  CARDIAC:  Distant heart sounds.  A grade 1/6 diastolic murmur of aortic insufficiency.  No rubs or gallops heard.  ABDOMEN:  Soft and nontender without evidence of organomegaly or masses.  I could not feel a distended bladder.  RECTAL:  A markedly enlarged prostate, slightly tender and very firm throughout.  Stool was negative for occult blood.  EXTREMITIES:  No clubbing, cyanosis, or edema.  NEUROLOGIC:  Exam was intact without focal abnormalities detected.  LABORATORY DATA:  The EKG showed sinus bradycardia and sinus arrhythmia with ST-T wave changes compatible with LVH.  A 2-D echocardiogram showed moderate concentric left ventricular hypertrophy,  overall normal left ventricular systolic function with an ejection fraction in the 55-65%, no regional wall motion abnormalities, and trivial aortic regurgitation.  The chest x-ray showed no evidence of active disease.  The heart size was in the upper limites of normal.  The aorta was mildly ectatic.  Spondylitic changes were seen in the mid thoracis spine.  The initial renal ultrasound showed moderate to severe bilateral hydronephrosis and a large mass in the lower midline of the pelvis consistent with bladder or prostate tumor.  A CT of the abdomen and pelvis showed bilateral percutaneous nephrostomies were performed by Arnell Sieving, M.D., of the radiology service on January 29, 2001.  A subsequent CT of the abdomen and pelvis showed bilateral percutaneous nephrostomy catheters with decompression of the drainage system; a 12 x 10 soft tissue mass occupying much of the mid pelvis; no pelvic adenopathy or free fluid was seen.  A subsequent CT biopsy of this mass yielded adenocarcinoma consistent with prostatic adenocarcinoma.  The Gleason score was 7 (3 + 4) and a well to moderate well-differentiated tumor.  Nuclear bone scan showed areas of osseous remodeling, which were consistent with degenerative changes.  No evidence of any metastatic disease.  Laboratory studies showed hemoglobin 11.6, hematocrit 34.2, and white count 7700 with a normal differential.  The hemoglobin dropped to 10.7 during the hospitalization.  Pro time 15.2 with an INR of 1.3 and PTT 33 seconds. Sodium 141, potassium 5.3, chloride 116, CO2 15, BUN 122, creatinine 12.2, blood sugar 72, calcium 9.1, total protein 6.9, albumin 3.6, AST 13, ALT 11, alkaline phosphatase 75, total bilirubin 0.7, magnesium 2.0, phosphorus 6.5 (repeat 5.4).  Serial studies were followed throughout his hospital course. BUN 33 and creatinine 3.3 at discharge.  The testosterone level was low at 87.  PSA 2648.  The urinalysis showed a  few red cells, white cells, and rare bacteria.  The urine culture revealed 15,000 colonies of a sensitive Proteus. He was treated with Cipro for this.  HOSPITAL COURSE:  The patient was admitted with acute renal failure felt to be secondary to prostatic adenocarcinoma and obstructive uropathy.  The patient was seen in consultation by Excell Seltzer. Annabell Howells, M.D., of the urology service and Garnetta Buddy, M.D., of nephrology.  They recommended bilateral percutaneous nephrostomy tubes, which were placed by Arnell Sieving, M.D., of radiology, with good drainage.  His BUN and creatinine started to improve and he established good urine flow, which appeared to be better from the right catheter than from the left catheter, which continued to drain just a small amount of bloody urine.  The status of the catheters was followed by radiology during the hospital course and continued follow-up as an outpatient will be done by Excell Seltzer. Annabell Howells, M.D.  The patient did not require dialysis and nephrology signed off.  Dr. Annabell Howells discussed options with the patient and his wife and the decision was made to proceed with bilateral orchiectomy, which was done on February 06, 2001.  He performed a cystoscopy at the same time, which revealed the  cancer extending from the bladder neck submucosally, greater on the left side than the right side and preventing passage of cystoscope into the bladder and obscuring the urethral orifices.  Excell Seltzer. Annabell Howells, M.D., is hopeful that the cancer will regress after the orchiectomy, allowing for easier bladder access.  He plans to repeat the CT scan in three to four weeks to assess the mass and consider further options, including possible radiation therapy and internal stents or possibly a cystoprostatectomy with urinary diversion since there were no signs of metastatic disease on the work-up.  The patient was started on labetalol and Cardura at the beginning of the hospitalization and  the blood pressure normalized with pressures ranging between 120/80 and 160/90.  MEDICATIONS AT DISCHARGE: 1. Labetalol 200 mg p.o. b.i.d. 2. Cardura 8 mg p.o. q.d. 3. Cipro 250 mg p.o. b.i.d. until gone.  DIET:  The patient was instructed on a low-sodium, no added salt (and no salt substitute) diet.  WOUND CARE:  He was instructed on care of the percutaneous drainage tubes by Excell Seltzer. Annabell Howells, M.D., and his staff.  FOLLOW-UP:  He was given a follow-up appointment to see me in one week and will follow up with Excell Seltzer. Annabell Howells, M.D., in two to three weeks and sooner p.r.n.  CONDITION ON DISCHARGE:  Improved. Dictated by:   Lonzo Cloud. Kriste Basque, M.D. [facility] Attending Physician:  Verdene Lennert DD:  02/08/01 TD:  02/08/01 Job: [id number]

## 2010-09-29 NOTE — Consult Note (Signed)
NAME:  [name], [name]                      ACCOUNT NO.:  [id number]   MEDICAL RECORD NO.:  [id number]                   PATIENT TYPE:  INP   LOCATION:  A208                                 FACILITY:  [facility]   PHYSICIAN:  [place] Bing, M.D.               DATE OF BIRTH:  05/24/1932   DATE OF CONSULTATION:  03/22/2003  DATE OF DISCHARGE:                                   CONSULTATION   PRIMARY CARE PHYSICIAN:  Lonzo Cloud. Kriste Basque, M.D. [facility]   HISTORY OF PRESENT ILLNESS:  A 77 year old gentleman admitted to [facility] after a syncopal spell at church. Mr. Lindon has no known  cardiovascular disease. He experienced a remote episode of syncope some  years ago which resulted in adjustment to his antihypertensive regimen and  no further symptomatic episodes. He was standing in church on Sunday and  noted a feeling of warmth without frank diaphoresis and mild GI upset. He  sat down for a while with some improvement of his symptoms, but subsequently  stood up resulting in loss of consciousness. There was no significant  trauma. He estimates that he was unresponsive for approximately five  minutes. There was no abnormality in bowel or bladder function. No apparent  seizure activity and no oral injury. Mentation was normal when he awoke.   Mr. Marlin has a history of hypertension that has been adequately treated.  He is unaware of his cholesterol status. He smokes cigarettes with a total  consumption of 35 pack years.   PAST MEDICAL HISTORY:  Notable for obstructive sleep apnea, history of  prostate carcinoma that led to prostatectomy and orchiectomy two years ago.  His medications prior to admission included an antihypertensive drug whose  name he does not recall. He reports an allergy to PENICILLIN.  His surgical  history includes a remote tonsillectomy and cholecystectomy.   SOCIAL HISTORY:  The patient lives in [place] with his wife, but attends  church in [place]. He  retired from [employer] work. He is married and  has four children. He denies excessive use of alcohol.   FAMILY HISTORY:  Mother and father both lived to an advanced age and neither  had coronary disease.   REVIEW OF SYSTEMS:  Reports occasional arthralgias of his shoulders; all  other systems negative except as noted above.   PHYSICAL EXAMINATION:  GENERAL: On exam, pleasant, well-appearing gentleman.  VITAL SIGNS: Temperature 98.4, heart rate 45 and regular, respirations 20,  blood pressure 190/100, weight 212.  HEENT: Anicteric sclerae.  NECK: No jugular venous distention.  SKIN: No significant lesions.  ENDOCRINE: No thyromegaly.  HEMATOPOIETIC:  No adenopathy.  LUNGS: Clear.  CARDIAC: Normal first and second heart sounds; minimal systolic murmur.  ABDOMEN: Soft and nontender; liver percussible somewhat below the right  costal margin, but no edge palpable. Normal bowel sounds; no arterial  bruits.  EXTREMITIES: Distal pulses intact; no edema.  NEUROMUSCULAR: Symmetric strength and  tone.  MUSCULOSKELETAL: Full range of motion in all joints.   LABORATORY DATA:  Notable for abnormalities on his head CT scan. There is an  old right basal ganglia lacunar infarct and some small vessel disease. No  chest x-ray report is available. The EKG shows sinus bradycardia and  anterolateral T-wave inversions. Other laboratory notable for creatinine of  2.2. Negative cardiac markers. Total cholesterol 201, HDL 51, and LDL 27.   IMPRESSION AND PLAN:  Mr. Cieslinski suffered a brief syncopal spell that is  suggestive of a neurocardiovascular etiology. The promontory symptoms and  setting of its occurrence in church support this notion. An echocardiogram  is pending, but there is nothing on exam to suggest significant valvular  heart disease. This is not a typical presentation for coronary disease.   Mr. Outman does have sinus bradycardia. His heart rate in the mid 40s is  of some concern,  but not clearly causing symptoms. A stress test will be  helpful to assess for chronotropic incompetence. Unless he has further  symptomatic episodes, implantation of a pacemaker is not required at  present.   Hypertension is present and is suboptimally controlled. A thiazide diuretic  may not be of much benefit with a baseline creatinine of 2.2. He certainly  cannot use beta blockers due to baseline bradycardia. An ACE inhibitor may  not be ideal due to the presence of baseline renal insufficiency. He is  already taking a calcium channel blocker and clonidine probably would be the  next drug to try even though this medication sometimes causes bradycardia.   Mr. Plude's lipid profile is suboptimal. His cardiovascular risk based  upon the Framingham calculation is approximately 3% per year. While therapy  is not mandated by the current guidelines, administration of statins is  certainly reasonable.   I will plan to review Mr. Ciaramitaro's echocardiogram, perform his stress test  tomorrow and issue final recommendations at that time.                                               Winfield Bing, M.D.    RR/MEDQ  D:  03/22/2003  T:  03/22/2003  Job:  [id number]

## 2010-09-29 NOTE — Op Note (Signed)
[facility]  Patient:    [name], [name] Visit Number: [id number] MRN: [id number]          Service Type: NES Location: NESC Attending Physician:  Evlyn Clines Dictated by:   Excell Seltzer. Annabell Howells, M.D. Proc. Date: 06/03/01 Admit Date:  06/03/2001 Discharge Date: 06/03/2001   CC:         Lorin Picket M. Kriste Basque, M.D.  Cecille Aver, M.D.   Operative Report  PROCEDURE:  Cystoscopy, left retrograde pyelogram with interpretation, bilateral double-J stent change.  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  SURGEON:  Excell Seltzer. Annabell Howells, M.D.  ANESTHESIA:  General.  DRAINS:  Bilateral 8.5 French x 24 cm double-J stents.  COMPLICATIONS:  None.  INDICATIONS:  [name] is a 77 year old white male who presented with renal insufficiency, large pelvic mass that was found to be prostate cancer. His bilateral ureteral obstruction was treated by percutaneous placement of ureteral stents.  Previously, the prostatic pelvic mass precluded cystoscopic access to the ureters.  He returns now after orchiectomy for stent change.  FINDINGS AND PROCEDURE:  The patient was given ampicillin p.o. beginning 24 hours prior to the procedure for positive urine culture.  Additionally he was given Diflucan 150 mg p.o. one hour prior to the procedure due to Yeast in his urine.  He was taken to the operating room where general anesthesia was induced.  He was placed in the lithotomy position.  His perineum and genitalia were prepped with Betadine solution and he was draped in the usual sterile fashion.  Cystoscopy was performed using 22 [place] scope and the 12 and 70 degree lenses.  Examination revealed normal urethra and intact external sphincter.  The prostatic urethra had trilobar hyperplasia with  currently minimal evidence of obstruction.  The previously noted trigonal mass had largely resolved since prior cystoscopy.  There was only small residual middle lobe noted.  The ureteral orifices were readily apparent with some edema surrounding them from the stent irritation.  Stents were noted exiting both ureteral orifices. There was some inflammatory changes of the bladder wall but no obvious tumors or other abnormalities were noted.  After thorough cystoscopy, the right ureteral stent was grasped with alligator forceps, pulled through the urethral meatus where a wire was passed to the kidney.  The old stent was removed.  The cystoscope was replaced over the wire and a fresh 8.5 French x 24 cm double-J stent was placed under fluoroscopic guidance.  I then attempted to repeat this procedure on the left.  However, the wire would not negotiate the stent, so the stent was removed.  I then attempted to reinsert the wire through the left ureteral orifice, but, there was some difficulty due to angulation.  I then tried with 70 degree lens, an Albarrans bridge also without difficulty.  At this point, a 5 [place] opening catheter was placed in the orifice and contrast was instilled in a retrograde fashion.  This demonstrated J-hooking of the distal ureter with some narrowing of the distal ureter, but, with the stent having been in place, there was a widely patent channel.  There was some proximal ureteral dilation but no significant hydronephrosis.  With the open end in place and the knowledge of the tract of the ureter, I was able to get a glide wire up into the ureter.  The open end catheter was advanced over the  glide wire and a standard guide wire was then placed to the open end catheter.  The second 8.5 French 24 cm double-J stent was then placed over the wire to the kidney.  The wire was removed leaving good coil in the kidney.  There was only a small leak in the bladder.  The grasping  forceps were used to negotiate the stent slightly distal to provide a better coil in the bladder.  At this point, the position of the stents were checked with fluoroscopy and were felt to be adequate.  The bladder was drained and the scope was removed.  The patient was taken down from the lithotomy position.  His anesthesia was reversed.  He was admitted to the recovery room in stable condition.  There were no complications. Dictated by:   Excell Seltzer. Annabell Howells, M.D. Attending Physician:  Evlyn Clines DD:  06/03/01 TD:  06/03/01 Job: [id number]

## 2010-09-29 NOTE — Op Note (Signed)
[facility]  Patient:    KINCAID, TIGER Visit Number: [id number] MRN: [id number]          Service Type: NES Location: NESC Attending Physician:  Evlyn Clines Dictated by:   Excell Seltzer. Annabell Howells, M.D. Proc. Date: 12/02/01 Admit Date:  12/02/2001   CC:         Lonzo Cloud. Kriste Basque, M.D. [facility]   Operative Report  PROCEDURE: 1. Cystoscopy. 2. Bilateral stent exchange.  PREOPERATIVE DIAGNOSIS:  Prostate cancer with bilateral ureteral obstruction.  POSTOPERATIVE DIAGNOSIS:  Prostate cancer with bilateral ureteral obstruction.  SURGEON:  Excell Seltzer. Annabell Howells, M.D.  ANESTHESIA:  General.  DRAINS:  Bilateral 8.5 French x 24 cm double-J stents.  COMPLICATIONS:  None.  INDICATIONS:  Mr. Mcneill is a 77 year old black male, who has a history of T4 adenocarcinoma of the prostate with bilateral ureteral obstruction.  He has had an orchiectomy with good response and has bilateral ureteral stents.  FINDINGS AND PROCEDURE:  The patient was given p.o. Tequin and Diflucan preoperatively.  He was taken to the operating room where a general anesthetic was induced.  He was placed in lithotomy position.  His perineum and genitalia were prepped with Betadine solution.  He was draped in the usual sterile fashion.  Cystoscopy was performed using the 22 [place] scope and the 12 and 70 degree lenses.  Examination revealed a normal urethra and intact external sphincter.  The prostatic urethra had some trilobar enlargement with mild obstruction.  Examination on the bladder revealed ureteral stents at both ureteral orifices with some surrounding edema and irritation.  The remainder of the bladder wall was unremarkable.  The previously noted midline mass from his prostate cancer has largely resolved.  After cystoscopic inspection, the right stent was grasped with a grasper and moved to the urethral meatus.  An attempt was made to pass a wire through the stent, but it was encrusted.   The stent was then removed, and the wire was passed up the ureter.  A fresh 8.5 French 24 cm stent was then placed over the wire to the kidney without difficulty.  The left stent was then managed in identical fashion.  The position of the stent was confirmed fluoroscopically.  After completion of the stent exchange, the bladder was drained; the scope was removed.  The patient was taken down from lithotomy position.  His anesthetic was reversed.  He was moved to the recovery room in stable condition.  There were no complications. Dictated by:   Excell Seltzer. Annabell Howells, M.D. Attending Physician:  Evlyn Clines DD:  12/02/01 TD:  12/02/01 Job: [id number]

## 2010-09-29 NOTE — Consult Note (Signed)
[place]  Patient:    [name], [name] Visit Number: [id number] MRN: [id number]          Service Type: MED Location: [id number] Attending Physician:  Verdene Lennert Dictated by:   Jennette Kettle, M.D. Proc. Date: 01/29/01 Admit Date:  01/29/2001                            Consultation Report  PRIMARY CARE PHYSICIAN:  Dr. Alroy Dust.  REASON FOR CONSULTATION:  Acute renal failure in a setting of prostate cancer with bilateral hydronephrosis.  HISTORY OF PRESENT ILLNESS:  [name] is a 77 year old African-American male who is followed by Dr. Kriste Basque as an outpatient.  The patient was noted to have an elevated PSA in January of approximately 10 and a creatinine of approximately 1.5.  The patient was to see a "prostate specialist" for possible surgery but was lost to follow up.  Recently [name] has states that he has has 2-3 weeks of noted increase frequency of urination at night, having 2 to 3 times.  The patient has noted a slight decrease in his appetite over the last 1 week.  Denies any nausea, vomiting, or mental status changes. The patient returned to Dr. Kriste Basque yesterday for routine physical examination, and was noted to have a creatinine of approximately 11 and a PSA level of 135.  The patient was asked to  go to the emergency room this morning, and at that time a renal ultrasound revealed bilateral hydronephrosis.  The patient underwent percutaneous nephrostomy tube placements per radiology today. The patient denies any back pain or bone pain currently.  PAST MEDICAL HISTORY: 1. Hypertension. 2. Cholecystectomy. 3. Hernia repair.  MEDICATIONS:  The patient states he is on 2 blood pressure medications (unknown names).  ALLERGIES:  No known drug allergies.  SOCIAL HISTORY:  The patient has a positive tobacco history of 50 pack year history and quit 6 months ago.  Negative alcohol use.  The patient is currently  retired and married with 3 children.  FAMILY HISTORY:  The patients father was deceased at age 23 secondary to prostate cancer.  REVIEW OF SYSTEMS:  Per history of present illness plus the patient states that he has a positive urinary frequency as above but denies dysuria, hematuria or pyuria.  The patient also denies any recent weight losses or nausea or vomiting, mental status changes, bone pain, chest pain, or shortness of breath.  PHYSICAL EXAMINATION:  VITAL SIGNS:  Temperature 96.8, blood pressure is in the 190/70s. Respirations 20 on room air with saturations 95%.  Pulses in the 50s.  GENERAL:  This is a well-developed well-nourished African-American male who is alert and oriented in no acute distress.  HEENT:  Normocephalic and atraumatic.  Pupils, equal, round, reactive to light and accommodation, oropharynx is clear.  NECK:  No lymphadenopathy noted, no bruits.  BACK:  The patients back has a bilateral nephrectomy tubes draining serosanguineous fluid.  LUNGS:  Clear to auscultation bilaterally.  CARDIAC:  Bradycardic but regular rhythm.  ABDOMEN:  Soft, nontender with positive bowel sounds.  RECTAL:  Per urology had an asymptomatic prostate.  EXTREMITIES;  No clubbing, cyanosis, or edema.  NEUROLOGIC:  Intact focally.  LABORATORY DATA:  The patients renal ultrasound on January 29, 2001, revealed bilateral hydronephrosis moderate to severe.  The right kidney measured 12.9, the left kidney measured 10.8.  The patient did have a noted midline low pelvis  mass measuring 10.4 x 9.1 x 9.0 cm.  Chest x-ray revealed no active disease.  CBC revealed a white count of 7.7, hemoglobin 11.6, platelets 164 with an MCV of 83.2.  The patients electrolytes revealed a sodium 141, potassium 5.3, chloride 116, bicarbonate 15, BUN 122, creatinine 12.2 with a glucose of 72. The patient had LFTs which bilirubin 0.7, AST of 13, ALT 11, protein 6.9, albumin 3.6, calcium 9.1. The  patient had coagulation studies revealing PT of 15.2, INR 1.3, PTT 33.  The patients urinalysis was negative for glucose, bilirubin, ketones, protein, nitrates, and leukocytes.  The patient did have trace blood at 3 to 6 red blood cells per microscopic field.  ASSESSMENT AND PLAN:  [name] is a 77 year old African-American male with prostate cancer, hypertension, admitted with bilateral hydronephrosis secondary to obstruction.  1. Prostate cancer with obstructive uropathy.  The patient is status post    percutaneous nephrostomy tube placements per radiology today.  Per primary    team, will be checking a testosterone level, bone scan, CT of the abdomen    and pelvis for work-up of metastatic cancer.  The patient will likely need    a prostate biopsy with probable orchiectomy versus Lupron in the future.    Urology is following.  2. Acute renal failure, likely secondary to #1.  The patients current    creatinine is 12.2, up from a baseline of approximately of 1.5 in January    2002.  Will avoid nephrotoxic agents such as ACE inhibitors and ARBs in    this acute setting.  Will monitor for potential resolution of acute renal    failure, however, given that the patient has had symptoms over the last 2-3    weeks, this is more worrisome that acute renal failure may not resolve.    There is currently no evidence of uremic symptoms such as nausea, vomiting    or mental status changes.  3. Hypertension.  The patient is currently labetalol per the primary team.    Will add an alpha blocker Cardura to this.  4. Pain.  Tylenol as needed for pain.  Avoid NSAIDs.Dictated by:   Jennette Kettle, M.D. Attending Physician:  Verdene Lennert DD:  01/29/01 TD:  01/30/01 Job: [id number]

## 2010-09-29 NOTE — Assessment & Plan Note (Signed)
[facility]                                 ON-CALL NOTE   NAME:[name], [name]                     MRN:          [id number]  DATE:04/13/2006                            DOB:          06/23/1932    PATIENT'S PHONE NUMBER:  [phone]   PROBLEM:  The patient's wife, [name], called saying the patient's blood  pressure had dropped.  He had seen Dr. Kriste Basque the first of the week and  restarted medications for blood pressure which he had previously dropped  off of.  He took them today and developed a cold sweat.  Blood pressure  she reported as unknown over 32 earlier and by the time of the call  reported blood pressure 107/43.   RECOMMENDATION:  He was going to remain off of his blood pressure  medicines until he could call the office on April 15, 2006, and  meanwhile would increase fluids and limit activity.  The caller was  comfortable with this approach.     Clinton D. Maple Hudson, MD, Tonny Bollman, FACP  Electronically Signed    CDY/MedQ  DD: 04/15/2006  DT: 04/16/2006  Job #: [id number]   cc:   Lonzo Cloud. Kriste Basque, MD

## 2010-11-06 ENCOUNTER — Encounter: Payer: Self-pay | Admitting: Pulmonary Disease

## 2010-11-07 ENCOUNTER — Encounter: Payer: Self-pay | Admitting: Pulmonary Disease

## 2010-11-07 ENCOUNTER — Ambulatory Visit (INDEPENDENT_AMBULATORY_CARE_PROVIDER_SITE_OTHER)
Admission: RE | Admit: 2010-11-07 | Discharge: 2010-11-07 | Disposition: A | Discharge status: Home / Self Care | Payer: Medicare Other | Source: Ambulatory Visit | Attending: Pulmonary Disease | Admitting: Pulmonary Disease

## 2010-11-07 ENCOUNTER — Ambulatory Visit (INDEPENDENT_AMBULATORY_CARE_PROVIDER_SITE_OTHER): Payer: Medicare Other | Admitting: Pulmonary Disease

## 2010-11-07 ENCOUNTER — Other Ambulatory Visit (INDEPENDENT_AMBULATORY_CARE_PROVIDER_SITE_OTHER): Payer: Medicare Other

## 2010-11-07 ENCOUNTER — Other Ambulatory Visit: Payer: Self-pay | Admitting: Pulmonary Disease

## 2010-11-07 DIAGNOSIS — I10 Essential (primary) hypertension: Secondary | ICD-10-CM

## 2010-11-07 DIAGNOSIS — I498 Other specified cardiac arrhythmias: Secondary | ICD-10-CM

## 2010-11-07 DIAGNOSIS — F17200 Nicotine dependence, unspecified, uncomplicated: Secondary | ICD-10-CM

## 2010-11-07 DIAGNOSIS — N259 Disorder resulting from impaired renal tubular function, unspecified: Secondary | ICD-10-CM

## 2010-11-07 DIAGNOSIS — C61 Malignant neoplasm of prostate: Secondary | ICD-10-CM

## 2010-11-07 DIAGNOSIS — I6350 Cerebral infarction due to unspecified occlusion or stenosis of unspecified cerebral artery: Secondary | ICD-10-CM

## 2010-11-07 DIAGNOSIS — E785 Hyperlipidemia, unspecified: Secondary | ICD-10-CM

## 2010-11-07 DIAGNOSIS — M1990 Unspecified osteoarthritis, unspecified site: Secondary | ICD-10-CM

## 2010-11-07 LAB — LIPID PANEL
Cholesterol: 210 mg/dL — ABNORMAL HIGH (ref 0–200)
HDL: 61.1 mg/dL (ref >39.00)
Total CHOL/HDL Ratio: 3
Triglycerides: 79 mg/dL (ref 0.0–149.0)
VLDL: 15.8 mg/dL (ref 0.0–40.0)

## 2010-11-07 LAB — HEPATIC FUNCTION PANEL
Alkaline Phosphatase: 125 U/L — ABNORMAL HIGH (ref 39–117)
Bilirubin, Direct: 0.1 mg/dL (ref 0.0–0.3)
Total Bilirubin: 0.2 mg/dL — ABNORMAL LOW (ref 0.3–1.2)

## 2010-11-07 LAB — CBC WITH DIFFERENTIAL/PLATELET
Basophils Relative: 0.6 % (ref 0.0–3.0)
Eosinophils Absolute: 0.3 10*3/uL (ref 0.0–0.7)
Eosinophils Relative: 3.3 % (ref 0.0–5.0)
HCT: 43.4 % (ref 39.0–52.0)
Hemoglobin: 14.6 g/dL (ref 13.0–17.0)
Lymphs Abs: 2.6 10*3/uL (ref 0.7–4.0)
MCHC: 33.6 g/dL (ref 30.0–36.0)
MCV: 88 fl (ref 78.0–100.0)
Monocytes Absolute: 0.5 10*3/uL (ref 0.1–1.0)
Neutro Abs: 4.3 10*3/uL (ref 1.4–7.7)
RBC: 4.93 Mil/uL (ref 4.22–5.81)
WBC: 7.8 10*3/uL (ref 4.5–10.5)

## 2010-11-07 LAB — BASIC METABOLIC PANEL
CO2: 28 mEq/L (ref 19–32)
Calcium: 10 mg/dL (ref 8.4–10.5)
Creatinine, Ser: 1.8 mg/dL — ABNORMAL HIGH (ref 0.4–1.5)
GFR: 46.27 mL/min — ABNORMAL LOW (ref >60.00)
Sodium: 144 mEq/L (ref 135–145)

## 2010-11-07 NOTE — Patient Instructions (Signed)
Today we updated your med list in our EPIC system>    Continue your current meds the same...  Today we did your follow up CXR & fasting blood work...    Please call the PHONE TREE in a few days for your results...    Dial [id number] & when prompted enter your patient number followed by the # symbol...    Your patient number is:  [id number]#  Call for any questions...  Let's plan a routine follow up appt in 6 months, sooner if needed for problems.Marland KitchenMarland Kitchen

## 2010-11-07 NOTE — Progress Notes (Signed)
Subjective:    Patient ID: [name], male    DOB: January 16, 1933, 77 y.o.   MRN: [id number]  HPI 77 y/o BM here for a follow up visit... he has multiple medical problems as noted below...   ~  November 04, 2009:  4 mo f/u & he states taking meds regularly now> BP controlled on Amlod & Losar;  Chol managed w/ diet + Simva40; & no TIAs or CP on the ASA/ Plavix... he is still smoking 1/2 to 1ppd unfortunately but he has lost 8# down to 212# today...   ~  May 09, 2010:D  68mo ROV & he tells me he quit smoking 8/11 on his own "I just decided"... his only complaint= his arthritis but it is still fairly mild, uses Tylenol Prn & doesn't want stronger pain meds (he knows to avoid NSAIDs due to renal insuffic)... he has f/u Urology DrWrenn 11/11 (hx advanced prostate cancer 2005 treated w/ orchiectomy) w/ PSA= 0.72 (stable) & he noted 18-24 month PSA doubling time on observation... renal insuffic stable w/ Creat ~2... he's already had the 2011 Flu vaccine.  ~  July 11, 2010:  59mo ROV- doing well, no new complaints or concerns, wants med refills for 30d supplies...  c/o arthritis pain & taking "advanced relief" from [facility] OTC "I read up on it- & it worked on my knee" (didn't bring bottle & reminded- no NSAIDs)... otherw stable- see prob list below; he has f/u DrWrenn in March...  ~  November 07, 2010:  71mo ROV & he is doing well by his report- no new complaints or concerns; he is back to smoking 1-2 cig/d & is admonished to quit & stay quit; BP controlled on meds & he denies CP, palpit, SOB, edema, etc; his weight is down 9# & he is encouraged to continue diet + exercise...    He saw DrWrenn for Urology 5/12> Hx prostate Ca, Bladder neck contracture, chronic renal insuffic, renal cysts, ED, etc; he has noted an 18-771mo PSA doubling time; he is followed every 68mo...    Follow up CXR today showed cardiomeg, tortuous Ao, mild emphysema, osteopenia & DJD spine;  LABS showed FLP improved on Simva40, needs  better diet to get to goal numbers, mild renal insuffic w/ Creat= 1.8, CBC- wnl...   Problem List:  OBSTRUCTIVE SLEEP APNEA (ICD-327.23) - hx prev ENT surgery by DrByers in 6/04...  CIGARETTE SMOKER (ICD-305.1) - Hx serious medical issues (prev stroke, HBP, etc) & he finally quit 8/11 he says> but restarted shortly thereafter & now admits to 1-2 cig/d... ~  2/11: pt states <1ppd & he notes he can quit when he wants w/o help... ~  CXR 2/11 showed tort vessels, lungs clear, thoracic spondylosis, NAD... ~  6/11: still smoking 1/2 to 1ppd & not yet ready to quit, refuses smoking cessation help. ~  12/11:  he reports quitting on his own 8/11... ~  6/12:  He reports restarting in the interim but only smoking 1-2 cig/d... ~  CXR 6/12 showed cardiomeg, tortuous Ao, mild emphysema, osteopenia & DJD spine...  HYPERTENSION (ICD-401.9) - on AMLODIPINE 5mg /d & LOSARTAN 50mg /d... he has Hx of HBP, HCVD, & hx of a small intracerebral hemorrhage...  there is a long hx of non-compliance w/ his med regimen...  BP today = 140/70 & he states similar at home...  denies HA, fatigue, visual changes, CP, palipit, dizziness, syncope, dyspnea, edema, etc...  BRADYCARDIA (ICD-427.89) - pt was eval by DrBerry 7/10 for sinus bradycardia  w/ rates in the 40's... he was asymptomatic w/ EKG showing LVH w/strain pattern; 2DEcho showing norm LVF; Cardiolite neg as well... decision made to continue observation & consider pacer if he becomes symptomatic...  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 prev on Foltx, but not currently taking any vitamins... advised regarding vitamin supplements needed.  HYPERLIPIDEMIA (ICD-272.4) - now on SIMVASTATIN 40mg /d, + diet Rx... ~  FLP 11/07 showed TChol 224, TG 77, HDL 52, LDL 160... on diet alone- Vytorin 10-40 started... ~  FLP 7/09 showed TChol 244, TG 217, HDL 45, LDL 167... not taking his Vytorin Rx. ~  FLP  2/11 showed TChol 243, TG 140, HDL 61, LDL 168... not taking Vytorin, change to SIMVA40. ~  FLP 6/11 on Simva40 showed TChol 177, TG 99, HDL 58, LDL 99... continue same. ~  FLP 12/11 on Simva40 showed TChol 180, TG 170, HDL 50, LDL 96 ~  FLP 6/12 on Simva40 showed TChol 210, TG 79, HDL 61, LDL 122... Improved, wt down 9#, keep up the good work.  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 treated w/ orchiectomy and stents which were subsequently removed... his Creat improved to a baseline of ~2, and his PSA's have improved to < 1 and stayed low... followed by DrWrenn regularly... ~  Note from DrWrenn 10/10  indicates problems of Prostate Ca- s/p orchiectomy, renal insuffic w/ creat~1.9, bladder neck contraction, & slowly rising PSA= 0.63 Jul10 w/ f/u Q39mo planned... ~  Note from DrWrenn 11/11 indicated PSA= 0.72, with PSA doubling time around 18-15mo... ~  Note from DrWrenn 5/12 reviewed, everything is stable, he continues Q11mo surveillance...  RENAL INSUFFICIENCY (ICD-588.9) - baseline Creat after recovery from the acute renal failure in 2002 has been ~2.0... ~  labs 7/09 showed BUN= 17, Creat= 1.6 ~  labs 2/11 showed BUN= 17, Creat= 1.9 ~  labs 6/11 showed BUN= 20, Creat= 1.8 ~  labs 12/11 showed BUN= 26, Creat= 2.0 ~  Labs 6/12 showed BUN= 19, Creat= 1.8  DEGENERATIVE JOINT DISEASE (ICD-715.90) - eval by DrVoytek 3/10 for left shoulder pain, right knee pain- Dx= DJD & bursitis, given shot & MOBIC 7.5mg  (caution w/ his renal insuffic) but he prefers his horse linament & Tylenol Prn...  Hx of STROKE (ICD-434.91) - not taking his prev Aggrenox Rx- he stopped on his own in 2009 stating that it caused him to be drowsey & sluggish (symptoms improved off Rx)... he was hosp 1/06 w/ TIA- right sided numbness, tingling, weakness... treated w/ heparin, then  Aggrenox & improved... CT showed atophy, sm lacunar infarcts bilat, & chr ischemic changes... 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 ~  2/11: we discussed getting on ASA 81mg /d & PLAVIX 75mg /d as substitiute for Aggrenox due to his vasc dis. ~  12/11: stable on the ASA/ Plavix w/o TIAs etc...   Past Surgical History  Procedure Date  . Cholecystectomy   . Inguinal hernia repair   . Ent surgery   . Orchiectomy     Outpatient Encounter Prescriptions as of 11/07/2010  Medication Sig Dispense Refill  . amLODipine (NORVASC) 5 MG tablet Take 5 mg by mouth daily.        Marland Kitchen aspirin 81 MG tablet Take 81 mg by  mouth daily.        . clopidogrel (PLAVIX) 75 MG tablet Take 75 mg by mouth daily.        Marland Kitchen losartan (COZAAR) 50 MG tablet Take 50 mg by mouth daily.        . Multiple Vitamin (MULTIVITAMIN) capsule Take 1 capsule by mouth daily.        . simvastatin (ZOCOR) 40 MG tablet Take 40 mg by mouth at bedtime.          No Known Allergies   Review of Systems        See HPI - all other systems neg except as noted...   The patient complains of dyspnea on exertion.  The patient denies anorexia, fever, weight loss, weight gain, vision loss, decreased hearing, hoarseness, chest pain, syncope, peripheral edema, prolonged cough, headaches, hemoptysis, abdominal pain, melena, hematochezia, severe indigestion/heartburn, hematuria, genital sores, muscle weakness, suspicious skin lesions, transient blindness, difficulty walking, depression, unusual weight change, abnormal bleeding, enlarged lymph nodes, and angioedema.     Objective:   Physical Exam    WD, WN, 77 y/o BM in NAD... VITAL SIGNS:  Reviewed... GENERAL:  Alert & oriented; pleasant & cooperative... HEENT:  [place]/AT, EOM-wnl, PERRLA, EACs-clear, TMs-wnl, NOSE-clear, THROAT-clear & wnl. NECK:  Supple w/ fairROM; no JVD; normal carotid impulses w/o bruits; no thyromegaly or nodules palpated; no  lymphadenopathy. CHEST:  Clear to P & A; without wheezes/ rales/ or rhonchi heard... HEART:  Regular Rhythm; gr 1/6 SEM without rubs or gallops apprec... ABDOMEN:  Soft & nontender; normal bowel sounds; no organomegaly or masses detected. EXT: without deformities, mod arthritic changes; no varicose veins/ +venous insuffic/ tr edema. NEURO:  CN's intact; no focal neuro deficits... DERM:  No lesions noted; no rash etc...   Assessment & Plan:   Cig Smoker>  Back to smoking 1-2 cig/d & encouraged to quit completely; he declines smoking cessation help!  HBP>  Controlled on Amlod & Losartan; continue thse + diet etc...  HYPERLIPID>  FLP is improved on his Simva40 + diet rx w/ some wt reduction; he is encouraged to keep up the good work...  Renal Insuffic>  Renal funct stable w/ Creat ~2...  PROSTATE CANCER>  Followed by drWrenn & his 5/12 note reviewed; continues on observation for his prostate ca w/ 18-24 mo PSADT...  DJD>  Followed by drVoytek; prewv Rx w/ Mobic, now uses OTC meds as needed...  Hx Stroke>  See prev hx- on ASA/ Plavix & stable w/o recurrent cerebral ischemic symptoms.Marland KitchenMarland Kitchen

## 2010-11-22 ENCOUNTER — Encounter: Payer: Self-pay | Admitting: Pulmonary Disease

## 2011-03-21 ENCOUNTER — Telehealth: Payer: Self-pay | Admitting: *Deleted

## 2011-03-21 NOTE — Telephone Encounter (Signed)
lmomtcb for pt to reschedule appt on 12/27.

## 2011-03-22 ENCOUNTER — Ambulatory Visit (INDEPENDENT_AMBULATORY_CARE_PROVIDER_SITE_OTHER): Payer: Medicare Other

## 2011-03-22 DIAGNOSIS — Z23 Encounter for immunization: Secondary | ICD-10-CM

## 2011-03-22 NOTE — Telephone Encounter (Signed)
PT CALLED BACK AND [facility] HIS APPT TO 05-17-2011

## 2011-05-10 ENCOUNTER — Ambulatory Visit: Payer: Medicare Other | Admitting: Pulmonary Disease

## 2011-05-17 ENCOUNTER — Other Ambulatory Visit: Payer: Self-pay | Admitting: Pulmonary Disease

## 2011-05-17 ENCOUNTER — Ambulatory Visit (INDEPENDENT_AMBULATORY_CARE_PROVIDER_SITE_OTHER): Payer: Medicare Other | Admitting: Pulmonary Disease

## 2011-05-17 ENCOUNTER — Encounter: Payer: Self-pay | Admitting: Pulmonary Disease

## 2011-05-17 ENCOUNTER — Other Ambulatory Visit (INDEPENDENT_AMBULATORY_CARE_PROVIDER_SITE_OTHER): Payer: Medicare Other

## 2011-05-17 DIAGNOSIS — E785 Hyperlipidemia, unspecified: Secondary | ICD-10-CM

## 2011-05-17 DIAGNOSIS — I498 Other specified cardiac arrhythmias: Secondary | ICD-10-CM

## 2011-05-17 DIAGNOSIS — I6350 Cerebral infarction due to unspecified occlusion or stenosis of unspecified cerebral artery: Secondary | ICD-10-CM

## 2011-05-17 DIAGNOSIS — M1990 Unspecified osteoarthritis, unspecified site: Secondary | ICD-10-CM

## 2011-05-17 DIAGNOSIS — I10 Essential (primary) hypertension: Secondary | ICD-10-CM

## 2011-05-17 DIAGNOSIS — F411 Generalized anxiety disorder: Secondary | ICD-10-CM

## 2011-05-17 DIAGNOSIS — C61 Malignant neoplasm of prostate: Secondary | ICD-10-CM

## 2011-05-17 DIAGNOSIS — N259 Disorder resulting from impaired renal tubular function, unspecified: Secondary | ICD-10-CM

## 2011-05-17 DIAGNOSIS — F419 Anxiety disorder, unspecified: Secondary | ICD-10-CM

## 2011-05-17 LAB — BASIC METABOLIC PANEL
Chloride: 111 mEq/L (ref 96–112)
Creatinine, Ser: 1.9 mg/dL — ABNORMAL HIGH (ref 0.4–1.5)
Sodium: 143 mEq/L (ref 135–145)

## 2011-05-17 LAB — CBC WITH DIFFERENTIAL/PLATELET
Eosinophils Relative: 2.6 % (ref 0.0–5.0)
HCT: 42.7 % (ref 39.0–52.0)
Hemoglobin: 14.4 g/dL (ref 13.0–17.0)
Lymphs Abs: 3 10*3/uL (ref 0.7–4.0)
Monocytes Relative: 8 % (ref 3.0–12.0)
Neutro Abs: 5.3 10*3/uL (ref 1.4–7.7)
WBC: 9.3 10*3/uL (ref 4.5–10.5)

## 2011-05-17 LAB — LIPID PANEL
Cholesterol: 240 mg/dL — ABNORMAL HIGH (ref 0–200)
Total CHOL/HDL Ratio: 5
VLDL: 27.6 mg/dL (ref 0.0–40.0)

## 2011-05-17 LAB — TSH: TSH: 1.52 u[IU]/mL (ref 0.35–5.50)

## 2011-05-17 LAB — HEPATIC FUNCTION PANEL: Albumin: 4.1 g/dL (ref 3.5–5.2)

## 2011-05-17 NOTE — Patient Instructions (Signed)
Today we updated your med list in our EPIC system...    Continue your current medications the same...  Today we did your follow up fasting blood work...    Please call the PHONE TREE in a few days for your results...    Dial [id number] & when prompted enter your patient number followed by the # symbol...    Your patient number is:  [phone]  Work on weight reduction w/ a goal of <200 lbs...  Call for any questions...  Let's plan a follow up visit in 6 months.Marland KitchenMarland Kitchen

## 2011-05-19 ENCOUNTER — Encounter: Payer: Self-pay | Admitting: Pulmonary Disease

## 2011-05-19 NOTE — Progress Notes (Signed)
Subjective:    Patient ID: [name], male    DOB: April 04, 1933, 78 y.o.   MRN: [id number]  HPI 78 y/o BM here for a follow up visit... he has multiple medical problems as noted below...   ~  May 09, 2010:  27mo ROV & he tells me he quit smoking 8/11 on his own "I just decided"... his only complaint= his arthritis but it is still fairly mild, uses Tylenol Prn & doesn't want stronger pain meds (he knows to avoid NSAIDs due to renal insuffic)... he has f/u Urology DrWrenn 11/11 (hx advanced prostate cancer 2005 treated w/ orchiectomy) w/ PSA= 0.72 (stable) & he noted 18-24 month PSA doubling time on observation... renal insuffic stable w/ Creat ~2... he's already had the 2011 Flu vaccine.  ~  July 11, 2010:  152mo ROV- doing well, no new complaints or concerns, wants med refills for 30d supplies...  c/o arthritis pain & taking "advanced relief" from [facility] OTC "I read up on it- & it worked on my knee" (didn't bring bottle & reminded- no NSAIDs)... otherw stable- see prob list below; he has f/u DrWrenn in March...  ~  November 07, 2010:  37mo ROV & he is doing well by his report- no new complaints or concerns; he is back to smoking 1-2 cig/d & is admonished to quit & stay quit; BP controlled on meds & he denies CP, palpit, SOB, edema, etc; his weight is down 9# & he is encouraged to continue diet + exercise...    He saw DrWrenn for Urology 5/12> Hx prostate Ca, Bladder neck contracture, chronic renal insuffic, renal cysts, ED, etc; he has noted an 18-87mo PSA doubling time; he is followed every 27mo...    Follow up CXR today showed cardiomeg, tortuous Ao, mild emphysema, osteopenia & DJD spine;  LABS showed FLP improved on Simva40, needs better diet to get to goal numbers, mild renal insuffic w/ Creat= 1.8, CBC- wnl...  ~  May 17, 2011:  27mo ROV     HBP> on Amlodip5, Losart50; BP= 144/80 & he denies CP, palpit, dizzy, syncope, SOB, edema, etc...    Bradycardia> followed by [facility], DrBerry w/  SBrady; asymptomatic, 2DEcho w/ norm LVF, Myoview neg as well; decision made for observation 7 consider pacer only if symptomatic...    Hyperlipid> on Simva40 + diet; see FLP below- numbers are worse but diet & compliance are suspect; asked to take med everyday & get on low chol/ low fat diet...    Renal> Renal insuffic & Mets prostate ca> Dx 2002 w/ PSA>2600 Creat=12 w/ large pelvic mass, bilat ureteral obstructions, & treated w/ orchiectomy & stents (later removed); Creat improved to new baseline ~2 and PSA improved to nadir ~1; followed by DrWrenn w/ slowly rising PSA w/ doubling time ~18-27mo... Seen 11/12 w/ PSA=1.15 & Creat=2.08; he plans continued 27mo check ups...    DJD> followed by [facility]; he uses horse linament & Tylenol...    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   Problem List:  OBSTRUCTIVE SLEEP APNEA (ICD-327.23) - hx prev ENT surgery by DrByers in 6/04...  CIGARETTE SMOKER (ICD-305.1) - Hx serious medical issues (prev stroke, HBP, etc) & he finally quit 8/11 he says> but restarted shortly thereafter & now admits to 1-2 cig/d... ~  2/11: pt states <1ppd &  he notes he can quit when he wants w/o help... ~  CXR 2/11 showed tort vessels, lungs clear, thoracic spondylosis, NAD... ~  6/11: still smoking 1/2 to 1ppd & not yet ready to quit, refuses smoking cessation help. ~  12/11:  he reports quitting on his own 8/11... ~  6/12:  He reports restarting in the interim but only smoking 1-2 cig/d... ~  CXR 6/12 showed cardiomeg, tortuous Ao, mild emphysema, osteopenia & DJD spine...  HYPERTENSION (ICD-401.9) - on AMLODIPINE 5mg /d & LOSARTAN 50mg /d... he has Hx of HBP, HCVD, & hx of a small intracerebral hemorrhage...  there is a long hx of non-compliance w/ his med regimen...   ~  6/12: BP= 140/70 & he states similar at  home; denies HA, fatigue, visual changes, CP, palipit, dizziness, syncope, dyspnea, edema, etc... ~  1/13: BP= 144/80 & he remains asymptomatic...  BRADYCARDIA (ICD-427.89) - pt was eval by DrBerry 7/10 for sinus bradycardia w/ rates in the 40's... he was asymptomatic w/ EKG showing LVH w/strain pattern; 2DEcho showing norm LVF; Cardiolite neg as well... decision made to continue observation & consider pacer if he becomes symptomatic... ~  1/13: pt remains asymptomatic w/o CP/ palpit/ dizzy/ syncope/ etc...  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 prev on Foltx, but not currently taking any vitamins... advised regarding vitamin supplements needed.  HYPERLIPIDEMIA (ICD-272.4) - now on SIMVASTATIN 40mg /d, + diet Rx... ~  FLP 11/07 showed TChol 224, TG 77, HDL 52, LDL 160... on diet alone- Vytorin 10-40 started... ~  FLP 7/09 showed TChol 244, TG 217, HDL 45, LDL 167... not taking his Vytorin Rx. ~  FLP 2/11 showed TChol 243, TG 140, HDL 61, LDL 168... not taking Vytorin, change to SIMVA40. ~  FLP 6/11 on Simva40 showed TChol 177, TG 99, HDL 58, LDL 99... continue same. ~  FLP 12/11 on Simva40 showed TChol 180, TG 170, HDL 50, LDL 96 ~  FLP 6/12 on Simva40 (wt=219#) showed TChol 210, TG 79, HDL 61, LDL 122... Improved, wt down 9# ~  FLP 1/13 on Simva40 (wt=218#) showed TChol 240, TG 138, HDL 53, LDL 150... Advised to take meds daily & get on diet.  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 treated w/ orchiectomy and stents which were subsequently removed... his Creat improved to a baseline of ~2, and his PSA's have improved to < 1 and stayed low... followed by DrWrenn regularly... ~  Note from DrWrenn 10/10  indicates problems of Prostate Ca- s/p orchiectomy, renal insuffic  w/ creat~1.9, bladder neck contraction, & slowly rising PSA= 0.63 Jul10 w/ f/u Q5mo planned... ~  Note from DrWrenn 11/11 indicated PSA= 0.72, with PSA doubling time around 18-29mo... ~  Note from DrWrenn 5/12 & 11/12 reviewed, PSA= 1.15, continues on Q36mo surveillance...  RENAL INSUFFICIENCY (ICD-588.9) - baseline Creat after recovery from the acute renal failure in 2002 has been ~2.0... ~  labs 7/09 showed BUN= 17, Creat= 1.6 ~  labs 2/11 showed BUN= 17, Creat= 1.9 ~  labs 6/11 showed BUN= 20, Creat= 1.8 ~  labs 12/11 showed BUN= 26, Creat= 2.0 ~  Labs 6/12 showed BUN= 19, Creat= 1.8 ~  Labs 1/13 showed BUN= 22, Creat= 1.9  DEGENERATIVE JOINT DISEASE (ICD-715.90) - eval by DrVoytek 3/10 for left shoulder  pain, right knee pain- Dx= DJD & bursitis, given shot & MOBIC 7.5mg  (caution w/ his renal insuffic) but he prefers his horse linament & Tylenol Prn...  Hx of STROKE (ICD-434.91) - not taking his prev Aggrenox Rx- he stopped on his own in 2009 stating that it caused him to be drowsey & sluggish (symptoms improved off Rx)... he was hosp 1/06 w/ TIA- right sided numbness, tingling, weakness... treated w/ heparin, then Aggrenox & improved... CT showed atophy, sm lacunar infarcts bilat, & chr ischemic changes... 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 ~  2/11: we discussed getting on ASA 81mg /d & PLAVIX 75mg /d as substitiute for Aggrenox due to his vasc dis. ~  He has been stable on the ASA/ Plavix w/o TIAs etc...   Past Surgical History  Procedure Date  . Cholecystectomy   . Inguinal hernia repair   . Ent surgery   . Orchiectomy     Outpatient Encounter Prescriptions as of 05/17/2011  Medication Sig Dispense Refill  . amLODipine (NORVASC) 5 MG tablet Take 5 mg by mouth daily.        Marland Kitchen aspirin 81 MG tablet Take 81 mg by mouth daily.        . clopidogrel (PLAVIX) 75 MG tablet Take 75 mg by mouth daily.        Marland Kitchen losartan (COZAAR) 50 MG tablet Take 50 mg  by mouth daily.        . Multiple Vitamin (MULTIVITAMIN) capsule Take 1 capsule by mouth daily.        . simvastatin (ZOCOR) 40 MG tablet Take 40 mg by mouth at bedtime.          No Known Allergies   Current Medications, Allergies, Past Medical History, Past Surgical History, Family History, and Social History were reviewed in [employer] record.     Review of Systems        See HPI - all other systems neg except as noted...   The patient complains of dyspnea on exertion.  The patient denies anorexia, fever, weight loss, weight gain, vision loss, decreased hearing, hoarseness, chest pain, syncope, peripheral edema, prolonged cough, headaches, hemoptysis, abdominal pain, melena, hematochezia, severe indigestion/heartburn, hematuria, genital sores, muscle weakness, suspicious skin lesions, transient blindness, difficulty walking, depression, unusual weight change, abnormal bleeding, enlarged lymph nodes, and angioedema.     Objective:   Physical Exam    WD, WN, 78 y/o BM in NAD... VITAL SIGNS:  Reviewed... GENERAL:  Alert & oriented; pleasant & cooperative... HEENT:  [place]/AT, EOM-wnl, PERRLA, EACs-clear, TMs-wnl, NOSE-clear, THROAT-clear & wnl. NECK:  Supple w/ fairROM; no JVD; normal carotid impulses w/o bruits; no thyromegaly or nodules palpated; no lymphadenopathy. CHEST:  Clear to P & A; without wheezes/ rales/ or rhonchi heard... HEART:  Regular Rhythm; gr 1/6 SEM without rubs or gallops apprec... ABDOMEN:  Soft & nontender; normal bowel sounds; no organomegaly or masses detected. EXT: without deformities, mod arthritic changes; no varicose veins/ +venous insuffic/ tr edema. NEURO:  CN's intact; no focal neuro deficits... DERM:  No lesions noted; no rash etc...  RADIOLOGY DATA:  Reviewed in the EPIC EMR & discussed w/ the patient...  LABORATORY DATA:  Reviewed in the EPIC EMR & discussed w/ the patient...   Assessment & Plan:   Cig Smoker>  Back to  smoking 1-2 cig/d & encouraged to quit completely; he declines smoking cessation help!  HBP>  Controlled on Amlod & Losartan; continue these +  diet etc...  HYPERLIPID>  FLP remains abnormal most likely due to diet 7 med non-compliance; rec to take med every day & get on better low chol, low fat diet...  Renal Insuffic>  Renal funct stable w/ Creat ~2...  PROSTATE CANCER>  Followed by DrWrenn & his 11/12 note reviewed; continues on observation for his prostate ca w/ 18-24 mo PSADT...  DJD>  Followed by [facility]; he uses OTC meds as needed...  Hx Stroke>  See prev hx- on ASA/ Plavix & stable w/o recurrent cerebral ischemic symptoms...   Patient's Medications  New Prescriptions   No medications on file  Previous Medications   AMLODIPINE (NORVASC) 5 MG TABLET    Take 5 mg by mouth daily.     ASPIRIN 81 MG TABLET    Take 81 mg by mouth daily.     CLOPIDOGREL (PLAVIX) 75 MG TABLET    Take 75 mg by mouth daily.     LOSARTAN (COZAAR) 50 MG TABLET    Take 50 mg by mouth daily.     MULTIPLE VITAMIN (MULTIVITAMIN) CAPSULE    Take 1 capsule by mouth daily.     SIMVASTATIN (ZOCOR) 40 MG TABLET    Take 40 mg by mouth at bedtime.    Modified Medications   No medications on file  Discontinued Medications   No medications on file

## 2011-07-28 ENCOUNTER — Other Ambulatory Visit: Payer: Self-pay | Admitting: Pulmonary Disease

## 2011-11-16 ENCOUNTER — Ambulatory Visit: Payer: Medicare Other | Admitting: Pulmonary Disease

## 2011-11-22 ENCOUNTER — Ambulatory Visit (INDEPENDENT_AMBULATORY_CARE_PROVIDER_SITE_OTHER): Payer: Medicare Other | Admitting: Pulmonary Disease

## 2011-11-22 ENCOUNTER — Encounter: Payer: Self-pay | Admitting: Pulmonary Disease

## 2011-11-22 VITALS — BP 120/78 | HR 46 | Temp 97.8°F | Ht 71.0 in | Wt 212.0 lb

## 2011-11-22 DIAGNOSIS — C61 Malignant neoplasm of prostate: Secondary | ICD-10-CM

## 2011-11-22 DIAGNOSIS — I6350 Cerebral infarction due to unspecified occlusion or stenosis of unspecified cerebral artery: Secondary | ICD-10-CM

## 2011-11-22 DIAGNOSIS — I10 Essential (primary) hypertension: Secondary | ICD-10-CM

## 2011-11-22 DIAGNOSIS — Z23 Encounter for immunization: Secondary | ICD-10-CM

## 2011-11-22 DIAGNOSIS — M1990 Unspecified osteoarthritis, unspecified site: Secondary | ICD-10-CM

## 2011-11-22 DIAGNOSIS — N259 Disorder resulting from impaired renal tubular function, unspecified: Secondary | ICD-10-CM

## 2011-11-22 DIAGNOSIS — E785 Hyperlipidemia, unspecified: Secondary | ICD-10-CM

## 2011-11-22 DIAGNOSIS — I8390 Asymptomatic varicose veins of unspecified lower extremity: Secondary | ICD-10-CM

## 2011-11-22 MED ORDER — SIMVASTATIN 40 MG PO TABS: 40.0000 mg | ORAL_TABLET | Freq: Every day | ORAL | Status: DC

## 2011-11-22 NOTE — Progress Notes (Signed)
Subjective:    Patient ID: [name], male    DOB: Jun 13, 1932, 78 y.o.   MRN: [id number]  HPI 78 y/o BM here for a follow up visit... he has multiple medical problems as noted below...   ~  May 09, 2010:  31mo ROV & he tells me he quit smoking 8/11 on his own "I just decided"... his only complaint= his arthritis but it is still fairly mild, uses Tylenol Prn & doesn't want stronger pain meds (he knows to avoid NSAIDs due to renal insuffic)... he has f/u Urology DrWrenn 11/11 (hx advanced prostate cancer 2005 treated w/ orchiectomy) w/ PSA= 0.72 (stable) & he noted 18-24 month PSA doubling time on observation... renal insuffic stable w/ Creat ~2... he's already had the 2011 Flu vaccine.  ~  July 11, 2010:  22mo ROV- doing well, no new complaints or concerns, wants med refills for 30d supplies...  c/o arthritis pain & taking "advanced relief" from [facility] OTC "I read up on it- & it worked on my knee" (didn't bring bottle & reminded- no NSAIDs)... otherw stable- see prob list below; he has f/u DrWrenn in March...  ~  November 07, 2010:  65mo ROV & he is doing well by his report- no new complaints or concerns; he is back to smoking 1-2 cig/d & is admonished to quit & stay quit; BP controlled on meds & he denies CP, palpit, SOB, edema, etc; his weight is down 9# & he is encouraged to continue diet + exercise...    He saw DrWrenn for Urology 5/12> Hx prostate Ca, Bladder neck contracture, chronic renal insuffic, renal cysts, ED, etc; he has noted an 18-35mo PSA doubling time; he is followed every 31mo...    Follow up CXR today showed cardiomeg, tortuous Ao, mild emphysema, osteopenia & DJD spine;  LABS showed FLP improved on Simva40, needs better diet to get to goal numbers, mild renal insuffic w/ Creat= 1.8, CBC- wnl...  ~  May 17, 2011:  31mo ROV     HBP> on Amlodip5, Losart50; BP= 144/80 & he denies CP, palpit, dizzy, syncope, SOB, edema, etc...    Bradycardia> followed by [facility], DrBerry w/  SBrady; asymptomatic, 2DEcho w/ norm LVF, Myoview neg as well; decision made for observation 7 consider pacer only if symptomatic...    Hyperlipid> on Simva40 + diet; see FLP below- numbers are worse but diet & compliance are suspect; asked to take med everyday & get on low chol/ low fat diet...    Renal> Renal insuffic & Mets prostate ca> Dx 2002 w/ PSA>2600 Creat=12 w/ large pelvic mass, bilat ureteral obstructions, & treated w/ orchiectomy & stents (later removed); Creat improved to new baseline ~2 and PSA improved to nadir ~1; followed by DrWrenn w/ slowly rising PSA w/ doubling time ~18-65mo... Seen 11/12 w/ PSA=1.15 & Creat=2.08; he plans continued 31mo check ups...    DJD> followed by [facility]; he uses horse linament & Tylenol...    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  ~  November 22, 2011:  31mo ROV & [name] feeling well- no new complaints or concerns;  He recently ran out of his Simva40 & requests refill for 90d supply;  His BP is well maintained on the ARB/ CCB ombo, and he denies cerebral ischemic symptoms on the ASA/ Plavix.Marland KitchenMarland Kitchen  He had a f/u visit w/ DrWrenn/ Urology 5/13> prostate cancer, bladder neck contracture, renal cyst, renal insuffic> he check the pt's Creat=1.9 (stable) & PSA was 1.33 (sl elevation w/ PSADT=18-37mo); voiding satis...    We reviewed prob list, meds, xrays and labs> see below>>   Problem List:  OBSTRUCTIVE SLEEP APNEA (ICD-327.23) - hx prev ENT surgery by DrByers in 6/04...  CIGARETTE SMOKER (ICD-305.1) - Hx serious medical issues (prev stroke, HBP, etc) & he finally quit 8/11 he says> but restarted shortly thereafter & now admits to 1-2 cig/d... ~  2/11: pt states <1ppd & he notes he can quit when he wants w/o help... ~  CXR 2/11 showed tort vessels, lungs clear, thoracic spondylosis, NAD... ~   6/11: still smoking 1/2 to 1ppd & not yet ready to quit, refuses smoking cessation help. ~  12/11:  he reports quitting on his own 8/11... ~  6/12:  He reports restarting in the interim but only smoking 1-2 cig/d... ~  CXR 6/12 showed cardiomeg, tortuous Ao, mild emphysema, osteopenia & DJD spine...  HYPERTENSION (ICD-401.9) - on AMLODIPINE 5mg /d & LOSARTAN 50mg /d... he has Hx of HBP, HCVD, & hx of a small intracerebral hemorrhage...  there is a long hx of non-compliance w/ his med regimen...   ~  6/12: BP= 140/70 & he states similar at home; denies HA, fatigue, visual changes, CP, palipit, dizziness, syncope, dyspnea, edema, etc... ~  1/13: BP= 144/80 & he remains asymptomatic... ~  7/13:  BP= 120/78 & he denies HA, CP, palpit, dizzy, syncope, edema...  BRADYCARDIA (ICD-427.89) - pt was eval by DrBerry 7/10 for sinus bradycardia w/ rates in the 40's... he was asymptomatic w/ EKG showing LVH w/strain pattern; 2DEcho showing norm LVF; Cardiolite neg as well... decision made to continue observation & consider pacer if he becomes symptomatic... ~  1/13:  pt remains asymptomatic w/o CP/ palpit/ dizzy/ syncope/ etc... 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 prev on Foltx, but not currently taking any vitamins... advised regarding vitamin supplements needed.  HYPERLIPIDEMIA (ICD-272.4) - now on SIMVASTATIN 40mg /d, + diet Rx... ~  FLP 11/07 showed TChol 224, TG 77, HDL 52, LDL 160... on diet alone- Vytorin 10-40 started... ~  FLP 7/09 showed TChol 244, TG 217, HDL 45, LDL 167... not taking his Vytorin Rx. ~  FLP 2/11 showed TChol 243, TG 140, HDL 61, LDL 168... not taking Vytorin, change to SIMVA40. ~  FLP 6/11 on Simva40 showed TChol 177, TG 99, HDL 58, LDL 99... continue same. ~  FLP 12/11 on Simva40 showed TChol 180, TG 170, HDL 50, LDL 96 ~  FLP 6/12 on Simva40 (wt=219#)  showed TChol 210, TG 79, HDL 61, LDL 122... Improved, wt down 9# ~  FLP 1/13 on Simva40 (wt=218#) showed TChol 240, TG 138, HDL 53, LDL 150... Advised to take meds daily & get on diet. ~  7/13:  Pt ran out last wk; Rx refilled today...  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 treated w/ orchiectomy and stents which were subsequently removed... his Creat improved to a baseline of ~2, and his PSA's have improved to < 1 and stayed low... followed by DrWrenn regularly... ~  Note from DrWrenn 10/10  indicates problems of Prostate Ca- s/p orchiectomy, renal insuffic w/ creat~1.9, bladder neck contraction, & slowly rising PSA= 0.63 Jul10 w/ f/u Q51mo planned... ~  Note from DrWrenn 11/11 indicated PSA= 0.72, with PSA doubling time around 18-7mo... ~  Note from DrWrenn 5/12 & 11/12 reviewed, PSA= 1.15, continues on Q45mo surveillance... ~  Note from DrWrenn 5/13 reviewed> PSA=1.33 w/ PSADT ~18-28mo, he remains on active surveillance...  RENAL INSUFFICIENCY (ICD-588.9) - baseline Creat after recovery from the acute renal failure in 2002 has been ~2.0... ~  labs 7/09 showed BUN= 17, Creat= 1.6 ~  labs 2/11 showed BUN= 17, Creat= 1.9 ~  labs 6/11 showed BUN= 20, Creat= 1.8 ~  labs 12/11 showed BUN= 26, Creat= 2.0 ~  Labs 6/12 showed BUN= 19, Creat= 1.8 ~  Labs 1/13 showed BUN= 22, Creat= 1.9 ~  Labs 5/13 by DrWrenn showed Creat=1.9  DEGENERATIVE JOINT DISEASE (ICD-715.90) - eval by DrVoytek 3/10 for left shoulder pain, right knee pain- Dx= DJD & bursitis, given shot & Mobic 7.5mg  (caution w/ his renal insuffic) but he prefers his horse linament & Tylenol Prn...  Hx of STROKE (ICD-434.91) - not taking his prev Aggrenox Rx- he stopped on his own in 2009 stating that it caused him to be drowsey & sluggish (symptoms improved off Rx)... he was hosp 1/06 w/ TIA-  right sided numbness, tingling, weakness... treated w/ heparin, then Aggrenox & improved... CT showed atophy, sm lacunar infarcts bilat, & chr ischemic changes... 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 ~  2/11: we discussed getting on ASA 81mg /d & PLAVIX 75mg /d as substitiute for Aggrenox due to his vasc dis. ~  He has been stable on the ASA/ Plavix w/o TIAs etc...   Past Surgical History  Procedure Date  . Cholecystectomy   . Inguinal hernia repair   . Ent surgery   . Orchiectomy     Outpatient Encounter Prescriptions as of 11/22/2011  Medication Sig Dispense Refill  . amLODipine (NORVASC) 5 MG tablet take 1 tablet by mouth once daily  30 tablet  12  . aspirin 81 MG tablet Take 81 mg by mouth daily.        . clopidogrel (PLAVIX) 75 MG tablet take 1 tablet by mouth once daily  30 tablet  12  . losartan (COZAAR) 50 MG tablet take 1 tablet by mouth once daily  30 tablet  12  . Multiple Vitamin (MULTIVITAMIN) capsule Take 1 capsule by mouth daily.        . simvastatin (ZOCOR) 40 MG tablet Take 40 mg by mouth at bedtime.          No Known Allergies   Current Medications, Allergies, Past Medical History, Past Surgical History, Family History, and Social History were reviewed in [employer] record.     Review of Systems        See HPI - all other systems neg except as noted...   The patient complains of dyspnea on exertion.  The patient denies anorexia, fever, weight loss, weight gain, vision loss, decreased hearing, hoarseness, chest pain, syncope, peripheral edema, prolonged cough, headaches, hemoptysis, abdominal pain, melena, hematochezia, severe indigestion/heartburn, hematuria, genital sores, muscle weakness, suspicious skin lesions, transient blindness, difficulty walking, depression, unusual weight change, abnormal bleeding, enlarged lymph nodes, and angioedema.     Objective:   Physical Exam    WD, WN, 78 y/o BM in  NAD... VITAL SIGNS:  Reviewed... GENERAL:  Alert &  oriented; pleasant & cooperative... HEENT:  [place]/AT, EOM-wnl, PERRLA, EACs-clear, TMs-wnl, NOSE-clear, THROAT-clear & wnl. NECK:  Supple w/ fairROM; no JVD; normal carotid impulses w/o bruits; no thyromegaly or nodules palpated; no lymphadenopathy. CHEST:  Clear to P & A; without wheezes/ rales/ or rhonchi heard... HEART:  Regular Rhythm; gr 1/6 SEM without rubs or gallops apprec... ABDOMEN:  Soft & nontender; normal bowel sounds; no organomegaly or masses detected. EXT: without deformities, mod arthritic changes; no varicose veins/ +venous insuffic/ tr edema. NEURO:  CN's intact; no focal neuro deficits... DERM:  No lesions noted; no rash etc...  RADIOLOGY DATA:  Reviewed in the EPIC EMR & discussed w/ the patient...  LABORATORY DATA:  Reviewed in the EPIC EMR & discussed w/ the patient...   Assessment & Plan:   Cig Smoker>  Back to smoking 1-2 cig/d & encouraged to quit completely; he declines smoking cessation help!  HBP>  Controlled on Amlod & Losartan; continue these + diet etc...  HYPERLIPID>  FLP remains abnormal most likely due to diet & med non-compliance; rec to take med every day & get on better low chol, low fat diet...  Renal Insuffic>  Renal funct stable w/ Creat ~2...  PROSTATE CANCER>  Followed by DrWrenn & his 5/13 note reviewed; continues on observation for his prostate ca w/ 18-24 mo PSADT...  DJD>  Followed by [facility]; he uses OTC meds as needed...  Hx Stroke>  See prev hx- on ASA/ Plavix & stable w/o recurrent cerebral ischemic symptoms...   Patient's Medications  New Prescriptions   No medications on file  Previous Medications   AMLODIPINE (NORVASC) 5 MG TABLET    take 1 tablet by mouth once daily   ASPIRIN 81 MG TABLET    Take 81 mg by mouth daily.     CLOPIDOGREL (PLAVIX) 75 MG TABLET    take 1 tablet by mouth once daily   LOSARTAN (COZAAR) 50 MG TABLET    take 1 tablet by mouth once daily   MULTIPLE  VITAMIN (MULTIVITAMIN) CAPSULE    Take 1 capsule by mouth daily.    Modified Medications   Modified Medication Previous Medication   SIMVASTATIN (ZOCOR) 40 MG TABLET simvastatin (ZOCOR) 40 MG tablet      Take 1 tablet (40 mg total) by mouth at bedtime.    Take 40 mg by mouth at bedtime.    Discontinued Medications   No medications on file

## 2011-11-22 NOTE — Patient Instructions (Addendum)
Today we updated your med list in our EPIC system...    Continue your current medications the same...  We gave your the combination Tetanus vaccine called the TDAP today (good for 69yrs)...  We reviewed your interim history & the visit w/ DrWrenn...  It's important to take your meds regularly (every day) and do not smoke!!!  Call for any questions...  Let's plan a follow up visit in 6 months w/ FASTING blood work 7 CXR at that time.Marland KitchenMarland Kitchen

## 2012-04-01 ENCOUNTER — Ambulatory Visit (INDEPENDENT_AMBULATORY_CARE_PROVIDER_SITE_OTHER): Payer: Medicare Other

## 2012-04-01 DIAGNOSIS — Z23 Encounter for immunization: Secondary | ICD-10-CM

## 2012-05-27 ENCOUNTER — Ambulatory Visit: Payer: Medicare Other | Admitting: Pulmonary Disease

## 2012-07-03 ENCOUNTER — Ambulatory Visit (INDEPENDENT_AMBULATORY_CARE_PROVIDER_SITE_OTHER): Payer: Medicare Other | Admitting: Pulmonary Disease

## 2012-07-03 ENCOUNTER — Other Ambulatory Visit (INDEPENDENT_AMBULATORY_CARE_PROVIDER_SITE_OTHER): Payer: Medicare Other

## 2012-07-03 ENCOUNTER — Ambulatory Visit (INDEPENDENT_AMBULATORY_CARE_PROVIDER_SITE_OTHER)
Admission: RE | Admit: 2012-07-03 | Discharge: 2012-07-03 | Disposition: A | Discharge status: Home / Self Care | Payer: Medicare Other | Source: Ambulatory Visit | Attending: Pulmonary Disease | Admitting: Pulmonary Disease

## 2012-07-03 VITALS — BP 126/68 | HR 46 | Temp 97.2°F | Ht 71.0 in | Wt 222.2 lb

## 2012-07-03 DIAGNOSIS — E785 Hyperlipidemia, unspecified: Secondary | ICD-10-CM

## 2012-07-03 DIAGNOSIS — E559 Vitamin D deficiency, unspecified: Secondary | ICD-10-CM

## 2012-07-03 DIAGNOSIS — I498 Other specified cardiac arrhythmias: Secondary | ICD-10-CM

## 2012-07-03 DIAGNOSIS — F17200 Nicotine dependence, unspecified, uncomplicated: Secondary | ICD-10-CM

## 2012-07-03 DIAGNOSIS — F411 Generalized anxiety disorder: Secondary | ICD-10-CM

## 2012-07-03 DIAGNOSIS — I10 Essential (primary) hypertension: Secondary | ICD-10-CM

## 2012-07-03 LAB — CBC WITH DIFFERENTIAL/PLATELET
Basophils Relative: 0.4 % (ref 0.0–3.0)
Eosinophils Relative: 3.7 % (ref 0.0–5.0)
HCT: 42.3 % (ref 39.0–52.0)
MCV: 87.2 fl (ref 78.0–100.0)
Monocytes Absolute: 0.6 10*3/uL (ref 0.1–1.0)
Monocytes Relative: 7.2 % (ref 3.0–12.0)
Neutrophils Relative %: 56.9 % (ref 43.0–77.0)
RBC: 4.86 Mil/uL (ref 4.22–5.81)
WBC: 8.9 10*3/uL (ref 4.5–10.5)

## 2012-07-03 LAB — LIPID PANEL
Cholesterol: 179 mg/dL (ref 0–200)
Triglycerides: 88 mg/dL (ref 0.0–149.0)

## 2012-07-03 LAB — BASIC METABOLIC PANEL
BUN: 21 mg/dL (ref 6–23)
CO2: 24 mEq/L (ref 19–32)
Chloride: 110 mEq/L (ref 96–112)
Creatinine, Ser: 1.9 mg/dL — ABNORMAL HIGH (ref 0.4–1.5)
Glucose, Bld: 85 mg/dL (ref 70–99)
Potassium: 4.4 mEq/L (ref 3.5–5.1)

## 2012-07-03 LAB — HEPATIC FUNCTION PANEL
ALT: 18 U/L (ref 0–53)
Albumin: 3.9 g/dL (ref 3.5–5.2)
Total Bilirubin: 0.5 mg/dL (ref 0.3–1.2)

## 2012-07-03 LAB — TSH: TSH: 1.43 u[IU]/mL (ref 0.35–5.50)

## 2012-07-03 MED ORDER — TRAMADOL HCL 50 MG PO TABS: 50.0000 mg | ORAL_TABLET | Freq: Three times a day (TID) | ORAL | Status: DC | PRN

## 2012-07-03 NOTE — Patient Instructions (Addendum)
Today we updated your med list in our EPIC system...    Continue your current medications the same...  We wrote a new prescription for TRAMADOL 50mg  to take up to 3 times daily as needed for pain...    Let me know how it is working for you...  Today we did your follow up CXR & FASTING blood work...    We will contact you w/ the results when avail...  Call for any problems...  Let's continue our 6 month check ups.Marland KitchenMarland Kitchen

## 2012-07-04 LAB — VITAMIN D 25 HYDROXY (VIT D DEFICIENCY, FRACTURES): Vit D, 25-Hydroxy: 28 ng/mL — ABNORMAL LOW (ref 30–89)

## 2012-07-15 ENCOUNTER — Encounter: Payer: Self-pay | Admitting: Pulmonary Disease

## 2012-07-15 NOTE — Progress Notes (Signed)
Subjective:    Patient ID: [name], male    DOB: 1932-09-27, 79 y.o.   MRN: [id number]  HPI 79 y/o BM here for a follow up visit... he has multiple medical problems as noted below...   ~  May 17, 2011:  364mo ROV     HBP> on Amlodip5, Losart50; BP= 144/80 & he denies CP, palpit, dizzy, syncope, SOB, edema, etc...    Bradycardia> followed by [facility], DrBerry w/ SBrady; asymptomatic, 2DEcho w/ norm LVF, Myoview neg as well; decision made for observation 7 consider pacer only if symptomatic...    Hyperlipid> on Simva40 + diet; see FLP below- numbers are worse but diet & compliance are suspect; asked to take med everyday & get on low chol/ low fat diet...    Renal> Renal insuffic & Mets prostate ca> Dx 2002 w/ PSA>2600 Creat=12 w/ large pelvic mass, bilat ureteral obstructions, & treated w/ orchiectomy & stents (later removed); Creat improved to new baseline ~2 and PSA improved to nadir ~1; followed by DrWrenn w/ slowly rising PSA w/ doubling time ~18-54mo... Seen 11/12 w/ PSA=1.15 & Creat=2.08; he plans continued 364mo check ups...    DJD> followed by [facility]; he uses horse linament & Tylenol...    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  ~  November 22, 2011:  364mo ROV & [name] is feeling well- no new complaints or concerns;  He recently ran out of his Simva40 & requests refill for 90d supply;  His BP is well maintained on the ARB/ CCB ombo, and he denies cerebral ischemic symptoms on the ASA/ Plavix...    He had a f/u visit w/ DrWrenn/ Urology 5/13> prostate cancer, bladder neck contracture, renal cyst, renal insuffic> he check the pt's Creat=1.9 (stable) & PSA was 1.33 (sl elevation w/ PSADT=18-5364mo); voiding satis...    We reviewed prob list, meds, xrays and labs> see below>>  ~  July 03, 2012:  64mo ROV & [name] has  gained 10# off his diet & not exercising; This has contributed to an incr in right knee pain- on Tramadol; We reviewed the following medical problems during today's office visit >>     HBP> on Amlodip5, Losart50; BP= 126/68 & he denies CP, palpit, dizzy, syncope, SOB, edema, etc...    Bradycardia> followed by [facility], DrBerry w/ SBrady; asymptomatic, 2DEcho w/ norm LVF, Myoview neg as well; decision made for observation & consider pacer only if symptomatic...    Hyperlipid> on Simva40 + diet; weight is up 10# & we reviewed diet needed; FLP shows TChol 179, TG 88, HDL 49, LDL112- improved, rec to get wt down...    Renal> Renal insuffic & Mets prostate ca> Dx 2002 w/ PSA>2600 Creat=12 w/ large pelvic mass, bilat ureteral obstructions, & treated w/ orchiectomy & stents (later removed); Creat improved to new baseline ~2 and PSA improved to nadir ~1; followed by DrWrenn w/ slowly rising PSA w/ doubling time ~18-74mo... Seen 12/13 w/ PSA=1.65 & Creat=1.94; he plans BMD measurement & continued 364mo check ups...    DJD> followed by [facility]; he uses horse linament & Tylenol; c/o right knee pain & we wrote for Tramadol50mg  Tid prn...    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 We reviewed prob list, meds, xrays and labs> see below for updates >>  CXR 2/14 showed normal heart size, tortuous ectatic Ao, clear lungs, NAD... LABS 2/14:  FLP- fair on Simva40 w/ LDL=112;  Chems- ok x Creat=1.9;  CBC- wnl;  TSH=1.43;  VitD=28... Needs ~2000u OTC Vit D supplement...         Problem List:  OBSTRUCTIVE SLEEP APNEA (ICD-327.23) - hx prev ENT surgery by DrByers in 6/04...  CIGARETTE SMOKER (ICD-305.1) - Hx serious medical issues (prev stroke, HBP, etc) & he finally quit 8/11 he says> but restarted shortly thereafter & now  admits to 1-2 cig/d... ~  2/11: pt states <1ppd & he notes he can quit when he wants w/o help... ~  CXR 2/11 showed tort vessels, lungs clear, thoracic spondylosis, NAD... ~  6/11: still smoking 1/2 to 1ppd & not yet ready to quit, refuses smoking cessation help. ~  12/11:  he reports quitting on his own 8/11 "I just decided" he said... ~  6/12:  He reports restarting in the interim but only smoking 1-2 cig/d... ~  CXR 6/12 showed cardiomeg, tortuous Ao, mild emphysema, osteopenia & DJD spine... ~  CXR 2/14 showed normal heart size, tortuous ectatic Ao, clear lungs, NAD... Still smoking "some" he says.  HYPERTENSION (ICD-401.9) - on AMLODIPINE 5mg /d & LOSARTAN 50mg /d... he has Hx of HBP, HCVD, & hx of a small intracerebral hemorrhage...  there is a long hx of non-compliance w/ his med regimen...   ~  6/12: BP= 140/70 & he states similar at home; denies HA, fatigue, visual changes, CP, palipit, dizziness, syncope, dyspnea, edema, etc... ~  1/13: BP= 144/80 & he remains asymptomatic... ~  7/13:  BP= 120/78 & he denies HA, CP, palpit, dizzy, syncope, edema... ~  2/14: on Amlodip5, Losart50; BP= 126/68 & he denies CP, palpit, dizzy, syncope, SOB, edema, etc.  BRADYCARDIA (ICD-427.89) - pt was eval by DrBerry 7/10 for sinus bradycardia w/ rates in the 40's... he was asymptomatic w/ EKG showing LVH w/strain pattern; 2DEcho showing norm LVF; Cardiolite neg as well... decision made to continue observation & consider pacer if he becomes symptomatic... ~  1/13:  pt remains asymptomatic w/o CP/ palpit/ dizzy/ syncope/ etc... ~  7/13:  He denies CP, palpit, dizzy, syncope ~  2/14: followed by [facility], DrBerry w/ [place]; asymptomatic, 2DEcho w/ norm LVF, Myoview neg as well; decision made for observation & consider pacer only if symptomatic.  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 prev on Foltx, but not currently taking any  vitamins... advised regarding vitamin supplements needed.  HYPERLIPIDEMIA (ICD-272.4) - now on SIMVASTATIN 40mg /d, + diet Rx... ~  FLP 11/07 showed TChol 224, TG 77, HDL 52, LDL 160... on diet alone- Vytorin 10-40 started... ~  FLP 7/09 showed TChol 244, TG 217, HDL 45, LDL 167... not taking his Vytorin Rx. ~  FLP 2/11 showed TChol 243, TG 140, HDL 61, LDL 168... not taking Vytorin, change to SIMVA40. ~  FLP 6/11 on Simva40 showed TChol 177, TG 99, HDL 58, LDL 99... continue same. ~  FLP 12/11 on Simva40 showed TChol 180, TG 170, HDL 50, LDL 96 ~  FLP 6/12 on Simva40 (wt=219#) showed TChol 210, TG 79, HDL 61, LDL 122... Improved, wt down 9# ~  FLP 1/13 on Simva40 (wt=218#) showed TChol  240, TG 138, HDL 53, LDL 150... Advised to take meds daily & get on diet. ~  7/13:  Pt ran out last wk; Rx refilled today... ~  2/14: on Simva40 + diet; weight is up 10# & we reviewed diet needed; FLP shows TChol 179, TG 88, HDL 49, LDL112- improved, rec to get wt down.  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 treated w/ orchiectomy and stents which were subsequently removed... his Creat improved to a baseline of ~2, and his PSA's have improved to < 1 and stayed low... followed by DrWrenn regularly... ~  Note from DrWrenn 10/10  indicates problems of Prostate Ca- s/p orchiectomy, renal insuffic w/ creat~1.9, bladder neck contraction, & slowly rising PSA= 0.63 Jul10 w/ f/u Q49mo planned... ~  Note from DrWrenn 11/11 indicated PSA= 0.72, with PSA doubling time around 18-60mo... ~  Note from DrWrenn 5/12 & 11/12 reviewed, PSA= 1.15, continues on Q59mo surveillance... ~  Note from DrWrenn 5/13 reviewed> prostate cancer, bladder neck contracture, renal cyst, renal insuffic> he checked the pt's Creat=1.9 (stable) & PSA was 1.33 (sl elevation w/ PSADT=18-70mo); voiding  satis... ~  Note from DrWrenn 12/13>  PSA=1.65 & Creat=1.94; he plans BMD measurement & continued 61mo check ups.  RENAL INSUFFICIENCY (ICD-588.9) - baseline Creat after recovery from the acute renal failure in 2002 has been ~2.0... ~  labs 7/09 showed BUN= 17, Creat= 1.6 ~  labs 2/11 showed BUN= 17, Creat= 1.9 ~  labs 6/11 showed BUN= 20, Creat= 1.8 ~  labs 12/11 showed BUN= 26, Creat= 2.0 ~  Labs 6/12 showed BUN= 19, Creat= 1.8 ~  Labs 1/13 showed BUN= 22, Creat= 1.9 ~  Labs 5/13 by DrWrenn showed Creat=1.9 ~  Labs here 2/14 showed BUN= 21, Creat= 1.9  DEGENERATIVE JOINT DISEASE (ICD-715.90) - eval by DrVoytek 3/10 for left shoulder pain, right knee pain- Dx= DJD & bursitis, given shot & Mobic 7.5mg  (caution w/ his renal insuffic) but he prefers his horse linament & Tylenol Prn...  Hx of STROKE (ICD-434.91) - not taking his prev Aggrenox Rx- he stopped on his own in 2009 stating that it caused him to be drowsey & sluggish (symptoms improved off Rx)... he was hosp 1/06 w/ TIA- right sided numbness, tingling, weakness... treated w/ heparin, then Aggrenox & improved... CT showed atophy, sm lacunar infarcts bilat, & chr ischemic changes... 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 ~  2/11: we discussed getting on ASA 81mg /d & PLAVIX 75mg /d as substitiute for Aggrenox due to his vasc dis. ~  He has been stable on the ASA/ Plavix w/o TIAs etc...   Past Surgical History  Procedure Laterality Date  . Cholecystectomy    . Inguinal hernia repair    . Ent surgery    . Orchiectomy      Outpatient Encounter Prescriptions as of 07/03/2012  Medication Sig Dispense Refill  . amLODipine (NORVASC) 5 MG tablet take 1 tablet by mouth once daily  30 tablet  12  . aspirin 81 MG tablet Take 81 mg by mouth daily.        . clopidogrel (PLAVIX) 75 MG tablet take 1 tablet by mouth once daily  30 tablet  12  . losartan (COZAAR) 50 MG tablet take 1 tablet by mouth once daily   30 tablet  12  .  Multiple Vitamin (MULTIVITAMIN) capsule Take 1 capsule by mouth daily.        . simvastatin (ZOCOR) 40 MG tablet Take 1 tablet (40 mg total) by mouth at bedtime.  30 tablet  11  . traMADol (ULTRAM) 50 MG tablet Take 1 tablet (50 mg total) by mouth 3 (three) times daily as needed for pain.  90 tablet  5   No facility-administered encounter medications on file as of 07/03/2012.    No Known Allergies   Current Medications, Allergies, Past Medical History, Past Surgical History, Family History, and Social History were reviewed in [employer] record.     Review of Systems        See HPI - all other systems neg except as noted...   The patient complains of dyspnea on exertion.  The patient denies anorexia, fever, weight loss, weight gain, vision loss, decreased hearing, hoarseness, chest pain, syncope, peripheral edema, prolonged cough, headaches, hemoptysis, abdominal pain, melena, hematochezia, severe indigestion/heartburn, hematuria, genital sores, muscle weakness, suspicious skin lesions, transient blindness, difficulty walking, depression, unusual weight change, abnormal bleeding, enlarged lymph nodes, and angioedema.     Objective:   Physical Exam    WD, WN, 79 y/o BM in NAD... VITAL SIGNS:  Reviewed... GENERAL:  Alert & oriented; pleasant & cooperative... HEENT:  [place]/AT, EOM-wnl, PERRLA, EACs-clear, TMs-wnl, NOSE-clear, THROAT-clear & wnl. NECK:  Supple w/ fairROM; no JVD; normal carotid impulses w/o bruits; no thyromegaly or nodules palpated; no lymphadenopathy. CHEST:  Clear to P & A; without wheezes/ rales/ or rhonchi heard... HEART:  Regular Rhythm; gr 1/6 SEM without rubs or gallops apprec... ABDOMEN:  Soft & nontender; normal bowel sounds; no organomegaly or masses detected. EXT: without deformities, mod arthritic changes; no varicose veins/ +venous insuffic/ tr edema. NEURO:  CN's intact; no focal neuro deficits... DERM:  No lesions  noted; no rash etc...  RADIOLOGY DATA:  Reviewed in the EPIC EMR & discussed w/ the patient...  LABORATORY DATA:  Reviewed in the EPIC EMR & discussed w/ the patient...   Assessment & Plan:    Cig Smoker>  Back to smoking  "some" he says & encouraged to quit completely; he declines smoking cessation help!  HBP>  Controlled on Amlod & Losartan; continue these + diet etc...  HYPERLIPID>  FLP remains abnormal most likely due to diet & med non-compliance; rec to take med every day & get on better low chol, low fat diet...  Renal Insuffic>  Renal funct stable w/ Creat ~2...  PROSTATE CANCER>  Followed by DrWrenn & his 5/13 note reviewed; continues on observation for his prostate ca w/ 18-24 mo PSADT...  DJD>  Followed by [facility]; he uses OTC meds as needed...  Hx Stroke>  See prev hx- on ASA/ Plavix & stable w/o recurrent cerebral ischemic symptoms...   Patient's Medications  New Prescriptions   TRAMADOL (ULTRAM) 50 MG TABLET    Take 1 tablet (50 mg total) by mouth 3 (three) times daily as needed for pain.  Previous Medications   AMLODIPINE (NORVASC) 5 MG TABLET    take 1 tablet by mouth once daily   ASPIRIN 81 MG TABLET    Take 81 mg by mouth daily.     CLOPIDOGREL (PLAVIX) 75 MG TABLET    take 1 tablet by mouth once daily   LOSARTAN (COZAAR) 50 MG TABLET    take 1 tablet by mouth once daily   MULTIPLE VITAMIN (MULTIVITAMIN) CAPSULE    Take 1 capsule by mouth  daily.     SIMVASTATIN (ZOCOR) 40 MG TABLET    Take 1 tablet (40 mg total) by mouth at bedtime.  Modified Medications   No medications on file  Discontinued Medications   No medications on file

## 2012-08-04 ENCOUNTER — Other Ambulatory Visit: Payer: Self-pay | Admitting: Pulmonary Disease

## 2012-08-04 MED ORDER — AMLODIPINE BESYLATE 5 MG PO TABS: 5.0000 mg | ORAL_TABLET | Freq: Every day | ORAL | Status: DC

## 2012-08-04 MED ORDER — LOSARTAN POTASSIUM 50 MG PO TABS: 50.0000 mg | ORAL_TABLET | Freq: Every day | ORAL | Status: DC

## 2012-08-04 MED ORDER — CLOPIDOGREL BISULFATE 75 MG PO TABS: 75.0000 mg | ORAL_TABLET | Freq: Every day | ORAL | Status: DC

## 2012-11-28 ENCOUNTER — Other Ambulatory Visit: Payer: Self-pay | Admitting: Pulmonary Disease

## 2012-12-31 ENCOUNTER — Encounter: Payer: Self-pay | Admitting: Pulmonary Disease

## 2012-12-31 ENCOUNTER — Other Ambulatory Visit (INDEPENDENT_AMBULATORY_CARE_PROVIDER_SITE_OTHER): Payer: Medicare Other

## 2012-12-31 ENCOUNTER — Ambulatory Visit (INDEPENDENT_AMBULATORY_CARE_PROVIDER_SITE_OTHER): Payer: Self-pay | Admitting: Pulmonary Disease

## 2012-12-31 VITALS — BP 120/80 | HR 57 | Temp 97.6°F | Ht 71.0 in | Wt 208.2 lb

## 2012-12-31 DIAGNOSIS — C61 Malignant neoplasm of prostate: Secondary | ICD-10-CM

## 2012-12-31 DIAGNOSIS — F17200 Nicotine dependence, unspecified, uncomplicated: Secondary | ICD-10-CM

## 2012-12-31 DIAGNOSIS — I6350 Cerebral infarction due to unspecified occlusion or stenosis of unspecified cerebral artery: Secondary | ICD-10-CM

## 2012-12-31 DIAGNOSIS — I10 Essential (primary) hypertension: Secondary | ICD-10-CM

## 2012-12-31 DIAGNOSIS — T24201D Burn of second degree of unspecified site of right lower limb, except ankle and foot, subsequent encounter: Secondary | ICD-10-CM

## 2012-12-31 DIAGNOSIS — E785 Hyperlipidemia, unspecified: Secondary | ICD-10-CM

## 2012-12-31 DIAGNOSIS — Z5189 Encounter for other specified aftercare: Secondary | ICD-10-CM

## 2012-12-31 DIAGNOSIS — N259 Disorder resulting from impaired renal tubular function, unspecified: Secondary | ICD-10-CM

## 2012-12-31 DIAGNOSIS — M1990 Unspecified osteoarthritis, unspecified site: Secondary | ICD-10-CM

## 2012-12-31 DIAGNOSIS — T24201A Burn of second degree of unspecified site of right lower limb, except ankle and foot, initial encounter: Secondary | ICD-10-CM | POA: Insufficient documentation

## 2012-12-31 LAB — BASIC METABOLIC PANEL
CO2: 27 mEq/L (ref 19–32)
Chloride: 109 mEq/L (ref 96–112)
Creatinine, Ser: 2 mg/dL — ABNORMAL HIGH (ref 0.4–1.5)
Potassium: 4.4 mEq/L (ref 3.5–5.1)
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
## 2013-02-10 ENCOUNTER — Encounter (HOSPITAL_BASED_OUTPATIENT_CLINIC_OR_DEPARTMENT_OTHER): Payer: Medicare Other | Attending: General Surgery

## 2013-02-10 DIAGNOSIS — T24239A Burn of second degree of unspecified lower leg, initial encounter: Secondary | ICD-10-CM | POA: Insufficient documentation

## 2013-02-10 DIAGNOSIS — I4891 Unspecified atrial fibrillation: Secondary | ICD-10-CM | POA: Insufficient documentation

## 2013-02-10 DIAGNOSIS — X088XXA Exposure to other specified smoke, fire and flames, initial encounter: Secondary | ICD-10-CM | POA: Insufficient documentation

## 2013-02-10 DIAGNOSIS — I10 Essential (primary) hypertension: Secondary | ICD-10-CM | POA: Insufficient documentation

## 2013-02-10 DIAGNOSIS — T310 Burns involving less than 10% of body surface: Secondary | ICD-10-CM | POA: Insufficient documentation

## 2013-02-10 DIAGNOSIS — C61 Malignant neoplasm of prostate: Secondary | ICD-10-CM | POA: Insufficient documentation
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
## 2013-02-16 ENCOUNTER — Encounter (HOSPITAL_BASED_OUTPATIENT_CLINIC_OR_DEPARTMENT_OTHER): Payer: Medicare Other | Attending: General Surgery

## 2013-02-16 ENCOUNTER — Ambulatory Visit (INDEPENDENT_AMBULATORY_CARE_PROVIDER_SITE_OTHER): Payer: Medicare Other

## 2013-02-16 DIAGNOSIS — Z23 Encounter for immunization: Secondary | ICD-10-CM

## 2013-02-16 DIAGNOSIS — Z8546 Personal history of malignant neoplasm of prostate: Secondary | ICD-10-CM | POA: Insufficient documentation

## 2013-02-16 DIAGNOSIS — T3111 Burns involving 10-19% of body surface with 10-19% third degree burns: Secondary | ICD-10-CM | POA: Insufficient documentation

## 2013-02-16 DIAGNOSIS — I4891 Unspecified atrial fibrillation: Secondary | ICD-10-CM | POA: Insufficient documentation

## 2013-02-16 DIAGNOSIS — I10 Essential (primary) hypertension: Secondary | ICD-10-CM | POA: Insufficient documentation

## 2013-02-16 DIAGNOSIS — X088XXA Exposure to other specified smoke, fire and flames, initial encounter: Secondary | ICD-10-CM | POA: Insufficient documentation

## 2013-02-16 DIAGNOSIS — T3110 Burns involving 10-19% of body surface with 0% to 9% third degree burns: Secondary | ICD-10-CM | POA: Insufficient documentation

## 2013-02-16 DIAGNOSIS — T24039A Burn of unspecified degree of unspecified lower leg, initial encounter: Secondary | ICD-10-CM | POA: Insufficient documentation
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
## 2013-02-27 ENCOUNTER — Telehealth: Payer: Self-pay | Admitting: Pulmonary Disease

## 2013-02-27 ENCOUNTER — Encounter (HOSPITAL_BASED_OUTPATIENT_CLINIC_OR_DEPARTMENT_OTHER): Payer: Self-pay | Admitting: *Deleted

## 2013-02-27 NOTE — Telephone Encounter (Signed)
Called and spoke with pts wife and she stated that the pt will be having a skin graft on Wednesday.  She stated that they have stopped his plavix today and will not restart on Thursday.  pts wife wanted to call and make SN aware of this.  She is aware that if any changes need to be made we will call her back.    No Known Allergies

## 2013-02-27 NOTE — Progress Notes (Signed)
Pt see dr Dayton Scrape dr berry for cardiology brady hr-stable-says he has not seen dr Tresa Endo this yr-will need to come in for bmet-ekg-had surgery to correct snoring 04-wife says he does not snore now-hx cva, but drives and does not need a cane-off plavix for surgery

## 2013-03-02 ENCOUNTER — Other Ambulatory Visit: Payer: Self-pay | Admitting: Plastic Surgery

## 2013-03-02 DIAGNOSIS — T24201S Burn of second degree of unspecified site of right lower limb, except ankle and foot, sequela: Secondary | ICD-10-CM

## 2013-03-03 NOTE — Telephone Encounter (Signed)
SN is aware and no changes needed to be made.

## 2013-03-04 ENCOUNTER — Encounter (HOSPITAL_BASED_OUTPATIENT_CLINIC_OR_DEPARTMENT_OTHER)
Admission: RE | Disposition: A | Discharge status: Home / Self Care | Payer: Self-pay | Source: Ambulatory Visit | Attending: Plastic Surgery

## 2013-03-04 ENCOUNTER — Ambulatory Visit (HOSPITAL_BASED_OUTPATIENT_CLINIC_OR_DEPARTMENT_OTHER)
Admission: RE | Admit: 2013-03-04 | Discharge: 2013-03-04 | Disposition: A | Discharge status: Home / Self Care | Payer: Medicare Other | Source: Ambulatory Visit | Attending: Plastic Surgery | Admitting: Plastic Surgery

## 2013-03-04 ENCOUNTER — Ambulatory Visit (HOSPITAL_BASED_OUTPATIENT_CLINIC_OR_DEPARTMENT_OTHER): Payer: Medicare Other | Admitting: Anesthesiology

## 2013-03-04 ENCOUNTER — Other Ambulatory Visit: Payer: Self-pay | Admitting: Plastic Surgery

## 2013-03-04 ENCOUNTER — Encounter (HOSPITAL_BASED_OUTPATIENT_CLINIC_OR_DEPARTMENT_OTHER): Payer: Self-pay | Admitting: Plastic Surgery

## 2013-03-04 ENCOUNTER — Ambulatory Visit (INDEPENDENT_AMBULATORY_CARE_PROVIDER_SITE_OTHER): Payer: Medicare Other | Admitting: Physician Assistant

## 2013-03-04 ENCOUNTER — Encounter: Payer: Self-pay | Admitting: Physician Assistant

## 2013-03-04 ENCOUNTER — Encounter (HOSPITAL_BASED_OUTPATIENT_CLINIC_OR_DEPARTMENT_OTHER): Payer: Medicare Other | Admitting: Anesthesiology

## 2013-03-04 VITALS — BP 150/100 | HR 44 | Ht 71.0 in | Wt 211.0 lb

## 2013-03-04 DIAGNOSIS — N259 Disorder resulting from impaired renal tubular function, unspecified: Secondary | ICD-10-CM

## 2013-03-04 DIAGNOSIS — X58XXXS Exposure to other specified factors, sequela: Secondary | ICD-10-CM | POA: Insufficient documentation

## 2013-03-04 DIAGNOSIS — IMO0002 Reserved for concepts with insufficient information to code with codable children: Secondary | ICD-10-CM | POA: Insufficient documentation

## 2013-03-04 DIAGNOSIS — I498 Other specified cardiac arrhythmias: Secondary | ICD-10-CM | POA: Insufficient documentation

## 2013-03-04 DIAGNOSIS — I10 Essential (primary) hypertension: Secondary | ICD-10-CM | POA: Insufficient documentation

## 2013-03-04 DIAGNOSIS — I480 Paroxysmal atrial fibrillation: Secondary | ICD-10-CM | POA: Insufficient documentation

## 2013-03-04 DIAGNOSIS — G4733 Obstructive sleep apnea (adult) (pediatric): Secondary | ICD-10-CM

## 2013-03-04 DIAGNOSIS — L97909 Non-pressure chronic ulcer of unspecified part of unspecified lower leg with unspecified severity: Secondary | ICD-10-CM | POA: Insufficient documentation

## 2013-03-04 DIAGNOSIS — I4891 Unspecified atrial fibrillation: Secondary | ICD-10-CM

## 2013-03-04 DIAGNOSIS — N289 Disorder of kidney and ureter, unspecified: Secondary | ICD-10-CM | POA: Insufficient documentation

## 2013-03-04 DIAGNOSIS — E785 Hyperlipidemia, unspecified: Secondary | ICD-10-CM | POA: Insufficient documentation

## 2013-03-04 HISTORY — PX: MINOR IRRIGATION AND DEBRIDEMENT OF WOUND: SHX6239

## 2013-03-04 HISTORY — DX: Presence of dental prosthetic device (complete) (partial): Z97.2

## 2013-03-04 HISTORY — DX: Presence of spectacles and contact lenses: Z97.3

## 2013-03-04 SURGERY — MINOR IRRIGATION AND DEBRIDEMENT OF WOUND
Site: Leg Lower | Laterality: Right | Wound class: Contaminated

## 2013-03-04 MED ORDER — SODIUM CHLORIDE 0.9 % IR SOLN
Status: DC | PRN
  Administered 2013-03-04: 11:00:00

## 2013-03-04 MED ORDER — LIDOCAINE-EPINEPHRINE 1 %-1:100000 IJ SOLN
INTRAMUSCULAR | Status: AC
  Filled 2013-03-04: qty 1

## 2013-03-04 MED ORDER — BUPIVACAINE-EPINEPHRINE PF 0.25-1:200000 % IJ SOLN
INTRAMUSCULAR | Status: AC
  Filled 2013-03-04: qty 30

## 2013-03-04 MED ORDER — FENTANYL CITRATE 0.05 MG/ML IJ SOLN
INTRAMUSCULAR | Status: AC
  Filled 2013-03-04: qty 4

## 2013-03-04 MED ORDER — RIVAROXABAN 15 MG PO TABS: 15.0000 mg | ORAL_TABLET | Freq: Two times a day (BID) | ORAL | Status: DC

## 2013-03-04 MED ORDER — CEFAZOLIN SODIUM-DEXTROSE 2-3 GM-% IV SOLR
INTRAVENOUS | Status: AC
  Filled 2013-03-04: qty 50

## 2013-03-04 MED ORDER — MIDAZOLAM HCL 2 MG/2ML IJ SOLN
INTRAMUSCULAR | Status: AC
  Filled 2013-03-04: qty 2

## 2013-03-04 MED ORDER — BACITRACIN ZINC 500 UNIT/GM EX OINT
TOPICAL_OINTMENT | CUTANEOUS | Status: AC
  Filled 2013-03-04: qty 28.35

## 2013-03-04 MED ORDER — RIVAROXABAN 15 MG PO TABS: 15.0000 mg | ORAL_TABLET | Freq: Every day | ORAL | Status: DC

## 2013-03-04 SURGICAL SUPPLY — 72 items
BAG DECANTER FOR FLEXI CONT (MISCELLANEOUS) IMPLANT
BANDAGE ELASTIC 3 VELCRO ST LF (GAUZE/BANDAGES/DRESSINGS) IMPLANT
BANDAGE ELASTIC 4 VELCRO ST LF (GAUZE/BANDAGES/DRESSINGS) ×2 IMPLANT
BANDAGE ELASTIC 6 VELCRO ST LF (GAUZE/BANDAGES/DRESSINGS) IMPLANT
BANDAGE GAUZE ELAST BULKY 4 IN (GAUZE/BANDAGES/DRESSINGS) ×2 IMPLANT
BENZOIN TINCTURE PRP APPL 2/3 (GAUZE/BANDAGES/DRESSINGS) IMPLANT
BLADE DERMATOME SS (BLADE) IMPLANT
BLADE HEX COATED 2.75 (ELECTRODE) IMPLANT
BLADE SURG 10 STRL SS (BLADE) IMPLANT
BLADE SURG 15 STRL LF DISP TIS (BLADE) ×1 IMPLANT
BLADE SURG 15 STRL SS (BLADE) ×1
BLADE SURG ROTATE 9660 (MISCELLANEOUS) IMPLANT
BNDG COHESIVE 4X5 TAN STRL (GAUZE/BANDAGES/DRESSINGS) IMPLANT
COTTONBALL LRG STERILE PKG (GAUZE/BANDAGES/DRESSINGS) ×4 IMPLANT
COVER MAYO STAND STRL (DRAPES) IMPLANT
COVER TABLE BACK 60X90 (DRAPES) IMPLANT
DECANTER SPIKE VIAL GLASS SM (MISCELLANEOUS) IMPLANT
DERMABOND ADVANCED (GAUZE/BANDAGES/DRESSINGS)
DERMABOND ADVANCED .7 DNX12 (GAUZE/BANDAGES/DRESSINGS) IMPLANT
DERMACARRIERS GRAFT 1 TO 1.5 (DISPOSABLE)
DRAPE EXTREMITY T 121X128X90 (DRAPE) IMPLANT
DRAPE INCISE IOBAN 66X45 STRL (DRAPES) IMPLANT
DRAPE PED LAPAROTOMY (DRAPES) IMPLANT
DRAPE SURG 17X23 STRL (DRAPES) ×2 IMPLANT
DRAPE U-SHAPE 76X120 STRL (DRAPES) IMPLANT
DRSG ADAPTIC 3X8 NADH LF (GAUZE/BANDAGES/DRESSINGS) ×4 IMPLANT
DRSG EMULSION OIL 3X3 NADH (GAUZE/BANDAGES/DRESSINGS) ×2 IMPLANT
DRSG OPSITE 6X11 MED (GAUZE/BANDAGES/DRESSINGS) IMPLANT
DRSG PAD ABDOMINAL 8X10 ST (GAUZE/BANDAGES/DRESSINGS) ×4 IMPLANT
DRSG TEGADERM 4X10 (GAUZE/BANDAGES/DRESSINGS) IMPLANT
DURA STEPPER LG (CAST SUPPLIES) ×2 IMPLANT
ELECT REM PT RETURN 9FT ADLT (ELECTROSURGICAL)
ELECTRODE REM PT RTRN 9FT ADLT (ELECTROSURGICAL) IMPLANT
GAUZE SPONGE 4X4 16PLY XRAY LF (GAUZE/BANDAGES/DRESSINGS) IMPLANT
GLOVE BIO SURGEON STRL SZ 6.5 (GLOVE) ×8 IMPLANT
GOWN PREVENTION PLUS XLARGE (GOWN DISPOSABLE) ×6 IMPLANT
GRAFT DERMACARRIERS 1 TO 1.5 (DISPOSABLE) IMPLANT
MATRIX SURGICAL PSM 10X15CM (Tissue) ×2 IMPLANT
MICROMATRIX 1000MG (Tissue) ×2 IMPLANT
NEEDLE 27GAX1X1/2 (NEEDLE) IMPLANT
NS IRRIG 1000ML POUR BTL (IV SOLUTION) ×2 IMPLANT
PACK BASIN DAY SURGERY FS (CUSTOM PROCEDURE TRAY) ×2 IMPLANT
PAD CAST 3X4 CTTN HI CHSV (CAST SUPPLIES) IMPLANT
PAD CAST 4YDX4 CTTN HI CHSV (CAST SUPPLIES) IMPLANT
PADDING CAST ABS 4INX4YD NS (CAST SUPPLIES)
PADDING CAST ABS COTTON 4X4 ST (CAST SUPPLIES) IMPLANT
PADDING CAST COTTON 3X4 STRL (CAST SUPPLIES)
PADDING CAST COTTON 4X4 STRL (CAST SUPPLIES)
PENCIL BUTTON HOLSTER BLD 10FT (ELECTRODE) IMPLANT
SHEET MEDIUM DRAPE 40X70 STRL (DRAPES) IMPLANT
SOLUTION PARTIC MCRMTRX 1000MG (Tissue) ×1 IMPLANT
SPONGE GAUZE 4X4 12PLY (GAUZE/BANDAGES/DRESSINGS) ×4 IMPLANT
SPONGE LAP 18X18 X RAY DECT (DISPOSABLE) IMPLANT
STAPLER VISISTAT 35W (STAPLE) IMPLANT
STOCKINETTE 4X48 STRL (DRAPES) IMPLANT
STOCKINETTE 6  STRL (DRAPES)
STOCKINETTE 6 STRL (DRAPES) IMPLANT
STOCKINETTE IMPERVIOUS LG (DRAPES) IMPLANT
STRIP CLOSURE SKIN 1/2X4 (GAUZE/BANDAGES/DRESSINGS) IMPLANT
SURGILUBE 2OZ TUBE FLIPTOP (MISCELLANEOUS) IMPLANT
SUT MON AB 5-0 PS2 18 (SUTURE) IMPLANT
SUT SILK 3 0 SH CR/8 (SUTURE) IMPLANT
SUT SILK 4 0 SH CR/8 (SUTURE) IMPLANT
SUT VIC AB 3-0 FS2 27 (SUTURE) IMPLANT
SUT VIC AB 5-0 P-3 18X BRD (SUTURE) IMPLANT
SUT VIC AB 5-0 P3 18 (SUTURE)
SUT VICRYL 4-0 PS2 18IN ABS (SUTURE) IMPLANT
SYR BULB 3OZ (MISCELLANEOUS) ×2 IMPLANT
SYR CONTROL 10ML LL (SYRINGE) IMPLANT
TOWEL OR 17X24 6PK STRL BLUE (TOWEL DISPOSABLE) ×2 IMPLANT
TRAY DSU PREP LF (CUSTOM PROCEDURE TRAY) ×2 IMPLANT
UNDERPAD 30X30 INCONTINENT (UNDERPADS AND DIAPERS) IMPLANT

## 2013-03-04 NOTE — Anesthesia Preprocedure Evaluation (Deleted)
Anesthesia Evaluation    Airway Mallampati: II TM Distance: >3 FB Neck ROM: Full    Dental   Pulmonary sleep apnea , COPDCurrent Smoker,  + rhonchi         Cardiovascular hypertension, Rhythm:Regular Rate:Bradycardia     Neuro/Psych CVA    GI/Hepatic   Endo/Other    Renal/GU Renal InsufficiencyRenal disease     Musculoskeletal   Abdominal   Peds  Hematology   Anesthesia Other Findings   Reproductive/Obstetrics                           Anesthesia Physical Anesthesia Plan  ASA: III  Anesthesia Plan: General   Post-op Pain Management:    Induction: Intravenous  Airway Management Planned: LMA  Additional Equipment:   Intra-op Plan:   Post-operative Plan: Extubation in OR  Informed Consent: I have reviewed the patients History and Physical, chart, labs and discussed the procedure including the risks, benefits and alternatives for the proposed anesthesia with the patient or authorized representative who has indicated his/her understanding and acceptance.     Plan Discussed with: CRNA and Surgeon  Anesthesia Plan Comments:         Anesthesia Quick Evaluation

## 2013-03-04 NOTE — Assessment & Plan Note (Addendum)
Patient is currently back in sinus Bacardi with a rate of 44 beats per minute.  The paranasal rate has always been slow.  Won't start him on 15mg  of Xarelto daily.  We'll also have him wear a CardioNet heart monitor for 30 days to see what his atrial fibrillation burden is.   We'll also order a 2-D echocardiogram.
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
## 2013-03-04 NOTE — Assessment & Plan Note (Signed)
Patient will likely need a repeat sleep study in the future as his wife says he still has apneic events at night.

## 2013-03-04 NOTE — Assessment & Plan Note (Signed)
Avoid beta blockers.  
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
## 2013-03-04 NOTE — Patient Instructions (Signed)
1   stop aspirin 2   startXarelto 15 mg daily. 3.  Heart monitor for 30 days.  Follow up in 30days with Dr. Allyson Sabal
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
## 2013-03-05 ENCOUNTER — Encounter: Payer: Self-pay | Admitting: Physician Assistant

## 2013-03-05 ENCOUNTER — Telehealth (HOSPITAL_COMMUNITY): Payer: Self-pay | Admitting: *Deleted

## 2013-03-09 ENCOUNTER — Telehealth: Payer: Self-pay | Admitting: Cardiovascular Disease

## 2013-03-09 ENCOUNTER — Encounter (HOSPITAL_BASED_OUTPATIENT_CLINIC_OR_DEPARTMENT_OTHER): Payer: Self-pay | Admitting: Plastic Surgery

## 2013-03-09 NOTE — Telephone Encounter (Signed)
Wants [name] to call her regarding putting Mr. Scheidegger's monitor on  She is supposed to receive either today or tommorow.  Says she does not feel comfortable putting it on him.  Please call

## 2013-03-10 NOTE — Telephone Encounter (Signed)
Pt. Called back and message left
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
## 2013-03-12 ENCOUNTER — Telehealth: Payer: Self-pay | Admitting: Cardiovascular Disease

## 2013-03-12 NOTE — Telephone Encounter (Signed)
Call to [employer] to check status.  Informed monitors on back order and pt will receive it next week.  Returned call and pt verified x 2 w/ wife, Aurea Graff.  Informed monitors on back order w/ company and non in stock in office.  Wife verbalized understanding and stated she will be coming in when they get it b/c she doesn't feel comfortable hooking pt up to monitor.  Informed that will be fine and advised she call back to schedule an appt when received for [place] to have monitor put on.  Also informed RN will notify Wilburt Finlay, PA-C and Dr. Allyson Sabal in case there is a change in plan r/t back order on monitors.  Wife verbalized understanding and agreed w/ plan.  Message forwarded to B. Hager, PA-C and Dr. Allyson Sabal.

## 2013-03-12 NOTE — Telephone Encounter (Signed)
Still have not received heart monitor.  Was supposed to receive Monday or Tuesday this week.  Have not.  Please call.

## 2013-03-18 ENCOUNTER — Telehealth: Payer: Self-pay | Admitting: *Deleted

## 2013-03-18 ENCOUNTER — Ambulatory Visit (INDEPENDENT_AMBULATORY_CARE_PROVIDER_SITE_OTHER): Payer: Medicare Other | Admitting: *Deleted

## 2013-03-18 ENCOUNTER — Telehealth: Payer: Self-pay | Admitting: Cardiovascular Disease

## 2013-03-18 DIAGNOSIS — R001 Bradycardia, unspecified: Secondary | ICD-10-CM

## 2013-03-18 DIAGNOSIS — I4891 Unspecified atrial fibrillation: Secondary | ICD-10-CM

## 2013-03-18 DIAGNOSIS — I498 Other specified cardiac arrhythmias: Secondary | ICD-10-CM

## 2013-03-18 NOTE — Telephone Encounter (Signed)
Call to pt and left message to call back.  Wanted to make sure pt has monitor for placement as no monitors available in clinic today.

## 2013-03-18 NOTE — Telephone Encounter (Signed)
Returned call and spoke w/ Aurea Graff, pt's wife.  RN asked if monitor received in mail.  She stated they do have it and appt confirmed for today at 10am.

## 2013-03-18 NOTE — Telephone Encounter (Signed)
Returning your call. °

## 2013-03-18 NOTE — Telephone Encounter (Signed)
[employer] called to report a stat result of new onset afib with heartrate of 139. Report will be  given to Dr. Allyson Sabal review.

## 2013-03-18 NOTE — Progress Notes (Signed)
Pt received 30-day event monitor in the mail from [employer].  Wife/pt not comfortable putting on monitor at home and came in for placement.  Monitor set-up and placed on pt.  Instructions given and advised they refer to guide or [employer] with questions about monitor.  Call office with concerns.  Pt/ wife verbalized understanding and agreed w/ plan.

## 2013-03-24 ENCOUNTER — Ambulatory Visit (HOSPITAL_COMMUNITY)
Admission: RE | Admit: 2013-03-24 | Discharge: 2013-03-24 | Disposition: A | Discharge status: Home / Self Care | Payer: Medicare Other | Source: Ambulatory Visit | Attending: Physician Assistant | Admitting: Physician Assistant

## 2013-03-24 DIAGNOSIS — I4891 Unspecified atrial fibrillation: Secondary | ICD-10-CM | POA: Insufficient documentation

## 2013-03-24 DIAGNOSIS — I080 Rheumatic disorders of both mitral and aortic valves: Secondary | ICD-10-CM | POA: Insufficient documentation

## 2013-03-24 DIAGNOSIS — I379 Nonrheumatic pulmonary valve disorder, unspecified: Secondary | ICD-10-CM | POA: Insufficient documentation

## 2013-03-24 DIAGNOSIS — I480 Paroxysmal atrial fibrillation: Secondary | ICD-10-CM

## 2013-03-24 DIAGNOSIS — I079 Rheumatic tricuspid valve disease, unspecified: Secondary | ICD-10-CM | POA: Insufficient documentation

## 2013-03-24 DIAGNOSIS — G4730 Sleep apnea, unspecified: Secondary | ICD-10-CM | POA: Insufficient documentation

## 2013-03-24 NOTE — Progress Notes (Signed)
2D Echo Performed 03/24/2013    [name], RCS  

## 2013-04-01 ENCOUNTER — Telehealth: Payer: Self-pay | Admitting: Cardiovascular Disease

## 2013-04-01 NOTE — Telephone Encounter (Signed)
Call from [employer].  Pt had 4.6 sec pause and was in sinus brady at the time.  Faxing report.  Will notify provider when received.

## 2013-04-01 NOTE — Telephone Encounter (Signed)
Returned call.  Left message to call back. ([employer])  EC has same numbers as patient.  Will await return call.

## 2013-04-02 ENCOUNTER — Encounter: Payer: Self-pay | Admitting: Physician Assistant

## 2013-04-02 ENCOUNTER — Telehealth: Payer: Self-pay | Admitting: Cardiovascular Disease

## 2013-04-02 ENCOUNTER — Ambulatory Visit (INDEPENDENT_AMBULATORY_CARE_PROVIDER_SITE_OTHER): Payer: Medicare Other | Admitting: Physician Assistant

## 2013-04-02 VITALS — BP 146/92 | HR 52 | Ht 71.0 in | Wt 213.0 lb

## 2013-04-02 DIAGNOSIS — I495 Sick sinus syndrome: Secondary | ICD-10-CM

## 2013-04-02 DIAGNOSIS — R001 Bradycardia, unspecified: Secondary | ICD-10-CM

## 2013-04-02 DIAGNOSIS — I498 Other specified cardiac arrhythmias: Secondary | ICD-10-CM

## 2013-04-02 NOTE — Patient Instructions (Signed)
Pacemaker implant scheduled for Apr 14, 2013

## 2013-04-02 NOTE — Assessment & Plan Note (Signed)
Patient is scheduled for a pacemaker implant on 04/14/2013 with Dr. Royann Shivers.

## 2013-04-02 NOTE — Telephone Encounter (Signed)
Not sure who took call.  Pt had appt today.

## 2013-04-02 NOTE — Telephone Encounter (Signed)
Returned call and pt verified x 2.  Pt informed call from [employer] w/ slow HR and pause.  Pt denied feeling anything yesterday.  Informed Dr. Allyson Sabal would like him to come in today to see Wilburt Finlay, PA-C.  Pt verbalized understanding and agreed w/ plan.  Appt scheduled for today at 4pm.  Pt agreed to call back if any changes in condition.

## 2013-04-02 NOTE — Telephone Encounter (Signed)
Returning your call. °

## 2013-04-02 NOTE — Telephone Encounter (Signed)
^^  Late Griffith Citron, NP was notified and advised if pt symptomatic to go to ER.
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
## 2013-04-02 NOTE — Telephone Encounter (Signed)
Dr. Allyson Sabal notified and advised pt come in to see MLP today.  Informed RN will schedule when able to contact pt.

## 2013-04-02 NOTE — Telephone Encounter (Signed)
Returned call.  Left important message to call back.

## 2013-04-03 ENCOUNTER — Encounter: Payer: Self-pay | Admitting: Cardiovascular Disease

## 2013-04-03 ENCOUNTER — Encounter (HOSPITAL_COMMUNITY): Payer: Self-pay | Admitting: Pharmacy Technician

## 2013-04-06 ENCOUNTER — Other Ambulatory Visit: Payer: Self-pay | Admitting: *Deleted

## 2013-04-06 DIAGNOSIS — Z01818 Encounter for other preprocedural examination: Secondary | ICD-10-CM

## 2013-04-06 DIAGNOSIS — Z01812 Encounter for preprocedural laboratory examination: Secondary | ICD-10-CM

## 2013-04-07 ENCOUNTER — Ambulatory Visit: Payer: Medicare Other | Admitting: Cardiovascular Disease

## 2013-04-07 LAB — CBC WITH DIFFERENTIAL/PLATELET
Basophils Absolute: 0 10*3/uL (ref 0.0–0.1)
HCT: 41.6 % (ref 39.0–52.0)
Hemoglobin: 14.1 g/dL (ref 13.0–17.0)
Lymphocytes Relative: 39 % (ref 12–46)
Lymphs Abs: 3.6 10*3/uL (ref 0.7–4.0)
Monocytes Absolute: 0.5 10*3/uL (ref 0.1–1.0)
Neutro Abs: 4.8 10*3/uL (ref 1.7–7.7)
Neutrophils Relative %: 51 % (ref 43–77)
Platelets: 219 10*3/uL (ref 150–400)
RDW: 15.6 % — ABNORMAL HIGH (ref 11.5–15.5)
WBC: 9.2 10*3/uL (ref 4.0–10.5)

## 2013-04-08 LAB — PROTIME-INR
INR: 1.51 — ABNORMAL HIGH (ref <1.50)
Prothrombin Time: 17.9 seconds — ABNORMAL HIGH (ref 11.6–15.2)

## 2013-04-08 LAB — COMPREHENSIVE METABOLIC PANEL
ALT: 12 U/L (ref 0–53)
Albumin: 4.2 g/dL (ref 3.5–5.2)
BUN: 25 mg/dL — ABNORMAL HIGH (ref 6–23)
CO2: 26 mEq/L (ref 19–32)
Calcium: 11 mg/dL — ABNORMAL HIGH (ref 8.4–10.5)
Chloride: 109 mEq/L (ref 96–112)
Creat: 1.98 mg/dL — ABNORMAL HIGH (ref 0.50–1.35)
Sodium: 143 mEq/L (ref 135–145)

## 2013-04-08 LAB — APTT: aPTT: 39 seconds — ABNORMAL HIGH (ref 24–37)

## 2013-04-08 LAB — URINALYSIS, MICROSCOPIC ONLY
Bacteria, UA: NONE SEEN
Crystals: NONE SEEN

## 2013-04-13 MED ORDER — SODIUM CHLORIDE 0.9 % IR SOLN
80.0000 mg | Status: DC
  Filled 2013-04-13: qty 2

## 2013-04-13 MED ORDER — CEFAZOLIN SODIUM-DEXTROSE 2-3 GM-% IV SOLR
2.0000 g | INTRAVENOUS | Status: DC
  Filled 2013-04-13: qty 50

## 2013-04-14 ENCOUNTER — Encounter (HOSPITAL_COMMUNITY): Payer: Self-pay | Admitting: General Practice

## 2013-04-14 ENCOUNTER — Ambulatory Visit (HOSPITAL_COMMUNITY)
Admission: RE | Admit: 2013-04-14 | Discharge: 2013-04-15 | Disposition: A | Discharge status: Home / Self Care | Payer: Medicare Other | Source: Ambulatory Visit | Attending: Cardiovascular Disease | Admitting: Cardiovascular Disease

## 2013-04-14 ENCOUNTER — Encounter (HOSPITAL_COMMUNITY)
Admission: RE | Disposition: A | Discharge status: Home / Self Care | Payer: Self-pay | Source: Ambulatory Visit | Attending: Cardiovascular Disease

## 2013-04-14 ENCOUNTER — Ambulatory Visit (HOSPITAL_COMMUNITY): Payer: Medicare Other

## 2013-04-14 DIAGNOSIS — I495 Sick sinus syndrome: Secondary | ICD-10-CM

## 2013-04-14 DIAGNOSIS — I10 Essential (primary) hypertension: Secondary | ICD-10-CM | POA: Insufficient documentation

## 2013-04-14 DIAGNOSIS — Z950 Presence of cardiac pacemaker: Secondary | ICD-10-CM

## 2013-04-14 DIAGNOSIS — I4891 Unspecified atrial fibrillation: Secondary | ICD-10-CM | POA: Insufficient documentation

## 2013-04-14 DIAGNOSIS — Z7901 Long term (current) use of anticoagulants: Secondary | ICD-10-CM

## 2013-04-14 DIAGNOSIS — Z01818 Encounter for other preprocedural examination: Secondary | ICD-10-CM

## 2013-04-14 DIAGNOSIS — I499 Cardiac arrhythmia, unspecified: Secondary | ICD-10-CM | POA: Insufficient documentation

## 2013-04-14 DIAGNOSIS — F17200 Nicotine dependence, unspecified, uncomplicated: Secondary | ICD-10-CM | POA: Insufficient documentation

## 2013-04-14 DIAGNOSIS — I455 Other specified heart block: Secondary | ICD-10-CM

## 2013-04-14 DIAGNOSIS — I480 Paroxysmal atrial fibrillation: Secondary | ICD-10-CM

## 2013-04-14 DIAGNOSIS — E785 Hyperlipidemia, unspecified: Secondary | ICD-10-CM | POA: Insufficient documentation

## 2013-04-14 HISTORY — PX: INSERT / REPLACE / REMOVE PACEMAKER: SUR710

## 2013-04-14 HISTORY — PX: PERMANENT PACEMAKER INSERTION: SHX5480

## 2013-04-14 HISTORY — DX: Presence of cardiac pacemaker: Z95.0

## 2013-04-14 LAB — SURGICAL PCR SCREEN
MRSA, PCR: NEGATIVE
Staphylococcus aureus: POSITIVE — AB

## 2013-04-14 SURGERY — PERMANENT PACEMAKER INSERTION
Anesthesia: LOCAL

## 2013-04-14 MED ORDER — ONDANSETRON HCL 4 MG/2ML IJ SOLN: 4.0000 mg | Freq: Four times a day (QID) | INTRAMUSCULAR | Status: DC | PRN

## 2013-04-14 MED ORDER — LIDOCAINE HCL (PF) 1 % IJ SOLN
INTRAMUSCULAR | Status: AC
  Filled 2013-04-14: qty 60

## 2013-04-14 MED ORDER — CHLORHEXIDINE GLUCONATE 4 % EX LIQD
60.0000 mL | Freq: Once | CUTANEOUS | Status: DC
  Filled 2013-04-14: qty 60

## 2013-04-14 MED ORDER — SODIUM CHLORIDE 0.9 % IV SOLN: INTRAVENOUS | Status: AC

## 2013-04-14 MED ORDER — AMLODIPINE BESYLATE 5 MG PO TABS
5.0000 mg | ORAL_TABLET | Freq: Every day | ORAL | Status: DC
  Administered 2013-04-15: 5 mg via ORAL
  Filled 2013-04-14: qty 1

## 2013-04-14 MED ORDER — CEFAZOLIN SODIUM 1-5 GM-% IV SOLN
1.0000 g | Freq: Four times a day (QID) | INTRAVENOUS | Status: AC
  Administered 2013-04-14 – 2013-04-15 (×3): 1 g via INTRAVENOUS
  Filled 2013-04-14 (×3): qty 50

## 2013-04-14 MED ORDER — HEPARIN (PORCINE) IN NACL 2-0.9 UNIT/ML-% IJ SOLN
INTRAMUSCULAR | Status: AC
  Filled 2013-04-14: qty 500

## 2013-04-14 MED ORDER — ACETAMINOPHEN 325 MG PO TABS: 325.0000 mg | ORAL_TABLET | ORAL | Status: DC | PRN

## 2013-04-14 MED ORDER — MUPIROCIN 2 % EX OINT
TOPICAL_OINTMENT | Freq: Two times a day (BID) | CUTANEOUS | Status: DC
  Administered 2013-04-14: 1 via NASAL
  Administered 2013-04-14 – 2013-04-15 (×2): via NASAL
  Filled 2013-04-14: qty 22

## 2013-04-14 MED ORDER — SODIUM CHLORIDE 0.9 % IV SOLN
INTRAVENOUS | Status: DC
  Administered 2013-04-14: 50 mL/h via INTRAVENOUS

## 2013-04-14 MED ORDER — HYDROCODONE-ACETAMINOPHEN 5-325 MG PO TABS: 1.0000 | ORAL_TABLET | ORAL | Status: DC | PRN

## 2013-04-14 MED ORDER — LOSARTAN POTASSIUM 50 MG PO TABS
50.0000 mg | ORAL_TABLET | Freq: Every day | ORAL | Status: DC
  Administered 2013-04-15: 50 mg via ORAL
  Filled 2013-04-14: qty 1

## 2013-04-14 MED ORDER — FENTANYL CITRATE 0.05 MG/ML IJ SOLN
INTRAMUSCULAR | Status: AC
  Filled 2013-04-14: qty 2

## 2013-04-14 MED ORDER — MUPIROCIN 2 % EX OINT
TOPICAL_OINTMENT | CUTANEOUS | Status: AC
  Administered 2013-04-14: 1 via NASAL
  Filled 2013-04-14: qty 22

## 2013-04-14 MED ORDER — MIDAZOLAM HCL 2 MG/2ML IJ SOLN
INTRAMUSCULAR | Status: AC
  Filled 2013-04-14: qty 2

## 2013-04-14 MED ORDER — SIMVASTATIN 40 MG PO TABS
40.0000 mg | ORAL_TABLET | Freq: Every day | ORAL | Status: DC
  Filled 2013-04-14 (×2): qty 1

## 2013-04-14 NOTE — Interval H&P Note (Signed)
History and Physical Interval Note:  04/14/2013 10:08 AM  [name]  has presented today for surgery, with the diagnosis of bradycardia  The various methods of treatment have been discussed with the patient and family. After consideration of risks, benefits and other options for treatment, the patient has consented to  Procedure(s): PERMANENT PACEMAKER INSERTION (N/A) as a surgical intervention .  The patient's history has been reviewed, patient examined, no change in status, stable for surgery.  I have reviewed the patient's chart and labs.  Questions were answered to the patient's satisfaction.     Karan Inclan
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
## 2013-04-14 NOTE — Op Note (Signed)
Procedure report  Procedure performed:  1. Implantation of new dual chamber permanent pacemaker 2. Fluoroscopy 3. Light sedation    Reason for procedure: Symptomatic bradycardia due to: Sinus node dysfunction Sinus arrest Tachycardia-bradycardia syndrome Paroxysmal atrial fibrillation  Procedure performed by: Thurmon Fair, MD  Complications: None  Estimated blood loss: <10 mL  Medications administered during procedure: Ancef 1 g intravenously Lidocaine 1% 30 mL locally,  Fentanyl 25 mcg intravenously Versed 2 mg intravenously  Device details: Generator Medtronic Advisa  model [id number] serial number [id number] Right atrial lead Medtronic [id number] serial number [id number] Right ventricular lead Medtronic [id number] serial number [id number]  Procedure details:  After the risks and benefits of the procedure were discussed the patient provided informed consent and was brought to the cardiac cath lab in the fasting state. The patient was prepped and draped in usual sterile fashion. Local anesthesia with 1% lidocaine was administered to to the left infraclavicular area. A 5-6 cm horizontal incision was made parallel with and 2-3 cm caudal to the left clavicle. Using electrocautery and blunt dissection a prepectoral pocket was created down to the level of the pectoralis major muscle fascia. The pocket was carefully inspected for hemostasis. An antibiotic-soaked sponge was placed in the pocket.  Under fluoroscopic guidance and using the modified Seldinger technique 2 separate venipunctures were performed to access the left subclavian vein. No difficulty was encountered accessing the vein.  Two J-tip guidewires were subsequently exchanged for two 7 French safe sheaths.  Under fluoroscopic guidance the ventricular lead was advanced to level of the mid to apical right ventricular septum and thet active-fixation helix was deployed. Prominent current of injury was seen. Satisfactory pacing  and sensing parameters were recorded. There was no evidence of diaphragmatic stimulation at maximum device output. The safe sheath was peeled away and the lead was secured in place with 2-0 silk.  In similar fashion the right atrial lead was advanced to the level of the atrial appendage. The active-fixation helix was deployed. There was prominent current of injury. Satisfactory  pacing and sensing parameters were recorded. There was no evidence of diaphragmatic stimulation with pacing at maximum device output. The safe sheath was peeled away and the lead was secured in place with 2-0 silk.  The antibiotic-soaked sponge was removed from the pocket. The pocket was flushed with copious amounts of antibiotic solution. Reinspection showed excellent hemostasis..  The ventricular lead was connected to the generator and appropriate ventricular pacing was seen. Subsequently the atrial lead was also connected. Repeat testing of the lead parameters later showed excellent values.  The entire system was then carefully inserted in the pocket with care been taking that the leads and device assumed a comfortable position without pressure on the incision. Great care was taken that the leads be located deep to the generator. The pocket was then closed in layers using 2 layers of 2-0 Vicryl and cutaneous staples, after which a sterile dressing was applied.  At the end of the procedure the following lead parameters were encountered:  Right atrial lead  sensed P waves 3.3 mV, impedance 860 ohms, threshold 0.9 V at 0.5 ms pulse width.  Right ventricular lead sensed R waves 10 mV, impedance 1180 ohms, threshold 0.5 V at 0.5 ms pulse width.   Thurmon Fair, MD, [facility] [phone] office [phone] pager

## 2013-04-15 ENCOUNTER — Ambulatory Visit (HOSPITAL_COMMUNITY): Payer: Medicare Other

## 2013-04-15 DIAGNOSIS — Z7901 Long term (current) use of anticoagulants: Secondary | ICD-10-CM

## 2013-04-15 DIAGNOSIS — Z950 Presence of cardiac pacemaker: Secondary | ICD-10-CM

## 2013-04-15 MED ORDER — ATORVASTATIN CALCIUM 20 MG PO TABS
20.0000 mg | ORAL_TABLET | Freq: Every day | ORAL | Status: DC
  Filled 2013-04-15: qty 1

## 2013-04-15 NOTE — Progress Notes (Signed)
Pt. Seen and examined. Agree with the NP/PA-C note as written.  Pacer site looks good. CXR is without PTX. Interrogation of the device shows normal lead impedances. Per Dr. Salena Saner, resume xarelto today. Ok for discharge today and follow-up with Dr. Salena Saner in 2 weeks.  Chrystie Nose, MD, [facility] Attending Cardiologist [facility]

## 2013-04-15 NOTE — Progress Notes (Signed)
Resume Xarelto today

## 2013-04-15 NOTE — Progress Notes (Signed)
    Subjective: Denies CP/SOB. No incisional pain.   Objective: Vital signs in last 24 hours: Temp:  [97.7 F (36.5 C)-99.1 F (37.3 C)] 99.1 F (37.3 C) (12/03 0616) Pulse Rate:  [58-77] 60 (12/03 0616) Resp:  [18] 18 (12/03 0616) BP: (143-186)/(55-104) 151/89 mmHg (12/03 0616) SpO2:  [96 %-100 %] 98 % (12/03 0616) Weight:  [213 lb (96.616 kg)] 213 lb (96.616 kg) (12/02 1041) Last BM Date: 04/14/13  Intake/Output from previous day:   Intake/Output this shift:    Medications Current Facility-Administered Medications  Medication Dose Route Frequency Provider Last Rate Last Dose  . acetaminophen (TYLENOL) tablet 325-650 mg  325-650 mg Oral Q4H PRN Mihai Croitoru, MD      . amLODipine (NORVASC) tablet 5 mg  5 mg Oral Daily Mihai Croitoru, MD      . HYDROcodone-acetaminophen (NORCO/VICODIN) 5-325 MG per tablet 1-2 tablet  1-2 tablet Oral Q4H PRN Mihai Croitoru, MD      . losartan (COZAAR) tablet 50 mg  50 mg Oral Daily Mihai Croitoru, MD      . mupirocin ointment (BACTROBAN) 2 %   Nasal BID Mihai Croitoru, MD      . ondansetron (ZOFRAN) injection 4 mg  4 mg Intravenous Q6H PRN Mihai Croitoru, MD      . simvastatin (ZOCOR) tablet 40 mg  40 mg Oral q1800 Mihai Croitoru, MD        PE: General appearance: alert, cooperative and no distress Lungs: clear to auscultation bilaterally Heart: regular rate and rhythm Extremities: no LEE Pulses: 2+ and symmetric Skin: warm and dry Neurologic: Grossly normal   Studies/Results:  Post-Op CXR 04/15/13 EXAM: CHEST - 2 VIEW  COMPARISON: 04/14/2013  FINDINGS: There is interval placement of a dual-chamber pacemaker with leads in the right atrium and right ventricle. No pneumothorax is identified. No edema, consolidation or pleural fluid is seen. The heart size is stable and within normal limits. There is stable tortuosity of the thoracic aorta.  IMPRESSION: No acute findings after pacemaker  placement.   Assessment/Plan  Principal Problem:   Tachy-brady syndrome Active Problems:   Paroxysmal atrial fibrillation   Sinus pause   Pacemaker - Medtronic, implanted 04/14/13   Chronic anticoagulation - on Xarelto   Plan: Day 1 s/p Medtronic PPM insertion for sinus node dysfunction/ tachy-brady syndrome. He denies CP/SOB. No incisional pain. Minimal drainage. Post-operative CXR demonstrates proper lead placement and no evidence of pneumothorax. Device interrogation demonstrates normal device functioning. NSR on telemetry. HR in the 60s. EKG demonstrates pacer spikes. In arm sling. Will continue for 2 weeks to prevent lead dislodgement. Will plan for discharge later today. Will send patient home with discharge instructions regarding wound care and activity restrictions. Will arrange w/u in 2 weeks for wound check/ staple removal. ? When to resume Xarelto. MD to follow.     LOS: 1 day    Jayln Madeira M. Sharol Harness, PA-C 04/15/2013 8:40 AM

## 2013-04-15 NOTE — Discharge Summary (Signed)
Physician Discharge Summary  Patient ID: [name] MRN: [id number] DOB/AGE: [birth year] 77 y.o.  Admit date: 04/14/2013 Discharge date: 04/15/2013  Admission Diagnoses: Tachy-Brady Syndrome  Discharge Diagnoses:  Principal Problem:   Tachy-brady syndrome Active Problems:   Paroxysmal atrial fibrillation   Sinus pause   Pacemaker - Medtronic, implanted 04/14/13   Chronic anticoagulation - on Xarelto    Discharged Condition: stable  Hospital Course: The patient is a 79 y/o male who presented to [facility] on 04/14/13 to undergo PPM insertion for Tachy-Brady Syndrome. He has been on Xarelto for PAF. His other history is significant for hypertension, hyperlipidemia, TIA, and sleep apnea. The procedure was performed by Dr. Royann Shivers. The patient underwent successful implantation of a dual chamber Medtronic device. He tolerated the procedure well. He had no chest pain or SOB. Post-operative CXR demonstrated proper lead placement and no evidence of a pneumothorax. Device interrogation demonstrated normal device functioning. The incision site remained stable. He was last seen and examined by Dr. Rennis Golden, who determined that he was stable for discharge home. He was instructed to continue in the arm sling for 2 weeks to prevent lead dislodgement. He was provided detailed instructions on proper wound care and activity restrictions. He was cleared by Dr. Royann Shivers to resume his Xarelto. He will follow up in clinic on 04/23/13 for wound check and staple removal. He will also follow-up with Dr. Royann Shivers in 4 weeks for pacemaker check.   Consults: None  Significant Diagnostic Studies:   Device details:  Generator Medtronic Advisa model [id number] serial number [id number]  Right atrial lead Medtronic [id number] serial number [id number]  Right ventricular lead Medtronic [id number] serial number [id number]   Treatments: See Hospital Course  Discharge Exam: Blood pressure 151/89, pulse 60, temperature 99.1 F  (37.3 C), temperature source Oral, resp. rate 18, height 5\' 11"  (1.803 m), weight 213 lb (96.616 kg), SpO2 98.00%.   Disposition: 01-Home or Self Care      Discharge Orders   Future Appointments Provider Department Dept Phone   04/23/2013 2:00 PM [name] [name] [facility] [phone]   Future Orders Complete By Expires   Diet - low sodium heart healthy  As directed    Discharge instructions  As directed    Comments:     Supplemental Discharge Instructions for  Pacemaker/Defibrillator Patients  Activity Do not raise your left/right arm above shoulder level or extend it backward beyond shoulder level for 2 weeks. Wear the arm sling as a reminder or as needed for comfort for 2 weeks. No heavy lifting or vigorous activity with your left/right arm for 6-8 weeks.    NO DRIVING is preferable for 2 weeks; If absolutely necessary, drive only short, familiar routes. DO wear your seatbelt, even if it crosses over the pacemaker site.  WOUND CARE Keep the wound area clean and dry.  Remove the dressing the day after you return home (usually 48 hours after the procedure). DO NOT SUBMERGE UNDER WATER UNTIL FULLY HEALED (no tub baths, hot tubs, swimming pools, etc.).  You  may shower or take a sponge bath after the dressing is removed. DO NOT SOAK the area and do not allow the shower to directly spray on the site. If you have staples, these will be removed in the office in 7-14 days. If you have tape/steri-strips on your wound, these will fall off; do not pull them off prematurely.   No bandage is needed on the site.  DO  NOT apply any creams, oils, or  ointments to the wound area. If you notice any drainage or discharge from the wound, any swelling, excessive redness or bruising at the site, or if you develop a fever > 101? F after you are discharged home, call the office at once.  Special Instructions You are still able to use cellular telephones.  Avoid carrying your  cellular phone near your device. When traveling through airports, show security personnel your identification card to avoid being screened in the metal detectors.  Avoid arc welding equipment, MRI testing (magnetic resonance imaging), TENS units (transcutaneous nerve stimulators).  Call the office for questions about other devices. Avoid electrical appliances that are in poor condition or are not properly grounded. Microwave ovens are safe to be near or to operate.  Additional information for defibrillator patients should your device go off: If your device goes off ONCE and you feel fine afterward, notify the clinic at [phone]. If your device goes off ONCE and you do not feel well afterward, call 911. If your device goes off TWICE or more in one day, call 911.  DO NOT DRIVE YOURSELF OR A FAMILY MEMBER WITH A DEFIBRILLATOR TO THE HOSPITAL-CALL 911.   Increase activity slowly  As directed    Lifting restrictions  As directed    Comments:     No lifting left arm above head for 2 weeks       Medication List         amLODipine 5 MG tablet  Commonly known as:  NORVASC  Take 1 tablet (5 mg total) by mouth daily.     losartan 50 MG tablet  Commonly known as:  COZAAR  Take 1 tablet (50 mg total) by mouth daily.     multivitamin with minerals Tabs tablet  Take 1 tablet by mouth daily.     Rivaroxaban 15 MG Tabs tablet  Commonly known as:  XARELTO  Take 15 mg by mouth daily.     simvastatin 40 MG tablet  Commonly known as:  ZOCOR  Take 40 mg by mouth daily.       Follow-up Information   Follow up with Joseph Lis, PA-C On 04/23/2013. (2:00 pm for staple removal)    Specialty:  Cardiology   Contact information:   [address] [place] [address] [phone]      TIME SPENT ON DISCHARGE SUMMARY, INCLUDING PHYSICIAN TIME: >30 MINUTES   Signed: Allayne Butcher, PA-C 04/15/2013, 11:02 AM

## 2013-04-23 ENCOUNTER — Ambulatory Visit (INDEPENDENT_AMBULATORY_CARE_PROVIDER_SITE_OTHER): Payer: Medicare Other | Admitting: Cardiology

## 2013-04-23 ENCOUNTER — Encounter: Payer: Self-pay | Admitting: Cardiology

## 2013-04-23 VITALS — BP 129/91 | HR 64 | Ht 71.0 in | Wt 210.8 lb

## 2013-04-23 DIAGNOSIS — Z7901 Long term (current) use of anticoagulants: Secondary | ICD-10-CM

## 2013-04-23 DIAGNOSIS — Z950 Presence of cardiac pacemaker: Secondary | ICD-10-CM

## 2013-04-23 NOTE — Assessment & Plan Note (Addendum)
S/p PPM insertion. HR stable in the mid 60s. Wound appears clean and well healed. Mild surrounding erythema. No drainage from incision site. Non tender. Stables were removed w/o difficulty. Patient was instructed to continue in arm sling for 1 additional week to prevent lead dislodgment. He was instructed to continue to refrain from standing water, including baths, hot tubs and swimming pools until cleared by Dr. Royann Shivers. He will return to clinic in 3 weeks to see Dr. Royann Shivers for 30 day PPM check.

## 2013-04-23 NOTE — Assessment & Plan Note (Addendum)
Resume for stroke prophylaxis, in the setting of PAF

## 2013-04-23 NOTE — Progress Notes (Signed)
Patient ID: [name], male   DOB: 1933/03/14, 79 y.o.   MRN: [id number]  04/23/2013 [name]   09-Feb-1933  [id number]  Primary Physicia NADEL,SCOTT M, MD Primary Cardiologist: Dr. Allyson Sabal  HPI:  The patient is a 79 y/o male, who presents to clinic today for a wound check after undergoing implantation of a dual chamber Medtronic device for tachy-brady syndrome on 04/14/13. He presents to the office accompanied by his wife, wearing the arm sling. He is here w/o any complaints. He reports daily compliance with arm sling and has avoided over-head activities. He denies any increased redness, pain, and swelling around incision site. He denies any drainage from the wound. No chest pain or shortness of breath.     Current Outpatient Prescriptions  Medication Sig Dispense Refill  . amLODipine (NORVASC) 5 MG tablet Take 1 tablet (5 mg total) by mouth daily.  30 tablet  11  . losartan (COZAAR) 50 MG tablet Take 1 tablet (50 mg total) by mouth daily.  30 tablet  11  . Multiple Vitamin (MULTIVITAMIN WITH MINERALS) TABS tablet Take 1 tablet by mouth daily.      . Rivaroxaban (XARELTO) 15 MG TABS tablet Take 15 mg by mouth daily.      . simvastatin (ZOCOR) 40 MG tablet Take 40 mg by mouth daily.       No current facility-administered medications for this visit.    No Known Allergies  History   Social History  . Marital Status: Married    Spouse Name: [name]    Number of Children: N/A  . Years of Education: N/A   Occupational History  . Retired   . RETIRED    Social History Main Topics  . Smoking status: Current Some Day Smoker -- 1.00 packs/day for 30 years    Types: Cigarettes  . Smokeless tobacco: Never Used     Comment:  pt stated he smokes 1 pack every 2 wks  . Alcohol Use: No  . Drug Use: No  . Sexual Activity: Not on file     Comment: started smoking 2-3 cig daily   Other Topics Concern  . Not on file   Social History Narrative  . No narrative on file      Review of Systems: General: negative for chills, fever, night sweats or weight changes.  Cardiovascular: negative for chest pain, dyspnea on exertion, edema, orthopnea, palpitations, paroxysmal nocturnal dyspnea or shortness of breath Dermatological: negative for rash Respiratory: negative for cough or wheezing Urologic: negative for hematuria Abdominal: negative for nausea, vomiting, diarrhea, bright red blood per rectum, melena, or hematemesis Neurologic: negative for visual changes, syncope, or dizziness All other systems reviewed and are otherwise negative except as noted above.    Blood pressure 129/91, pulse 64, height 5\' 11"  (1.803 m), weight 210 lb 12.8 oz (95.618 kg).  General appearance: alert, cooperative and no distress Neck: no carotid bruit and no JVD Lungs: clear to auscultation bilaterally Heart: regular rate and rhythm, S1, S2 normal, no murmur, click, rub or gallop Extremities: no LEE Pulses: 2+ and symmetric Skin: warm and dy Neurologic: Grossly normal    ASSESSMENT AND PLAN:   Pacemaker - Medtronic, implanted 04/14/13 S/p PPM insertion. HR stable in the mid 60s. Wound appears clean and well healed. Mild surrounding erythema. No drainage from incision site. Non tender. Stables were removed w/o difficulty. Patient was instructed to continue in arm sling for 1 additional week to prevent lead dislodgment. He was instructed  to continue to refrain from standing water, including baths, hot tubs and swimming pools until cleared by Dr. Royann Shivers. He will return to clinic in 3 weeks to see Dr. Royann Shivers for 30 day PPM check.   Chronic anticoagulation - on Xarelto  Resume for stroke prophylaxis, in the setting of PAF   PLAN  Staples were successfully removed. Wound is well healed. I have instructed patient to continue in arm sling and to refrain from any overhead activities for at least another week. I have ordered for him to f/u with Dr. Royann Shivers to 3 weeks for his 30  day Pacer check.   [name], BRITTAINYPA-C 04/23/2013 5:51 PM

## 2013-04-23 NOTE — Patient Instructions (Signed)
Continue to wear arm sling for another week. No overhead activities. No baths, hot tubs or swimming pools until cleared by Dr. Royann Shivers. Call our office if you notice an increased redness, pain, swelling or drainage from wound site.

## 2013-05-18 ENCOUNTER — Encounter: Payer: Self-pay | Admitting: Cardiovascular Disease

## 2013-05-18 ENCOUNTER — Ambulatory Visit (INDEPENDENT_AMBULATORY_CARE_PROVIDER_SITE_OTHER): Payer: Medicare HMO | Admitting: *Deleted

## 2013-05-18 ENCOUNTER — Ambulatory Visit (INDEPENDENT_AMBULATORY_CARE_PROVIDER_SITE_OTHER): Payer: Medicare HMO | Admitting: Cardiovascular Disease

## 2013-05-18 VITALS — BP 132/90 | HR 68 | Ht 71.0 in | Wt 215.7 lb

## 2013-05-18 DIAGNOSIS — I480 Paroxysmal atrial fibrillation: Secondary | ICD-10-CM

## 2013-05-18 DIAGNOSIS — Z950 Presence of cardiac pacemaker: Secondary | ICD-10-CM

## 2013-05-18 DIAGNOSIS — I495 Sick sinus syndrome: Secondary | ICD-10-CM

## 2013-05-18 DIAGNOSIS — I455 Other specified heart block: Secondary | ICD-10-CM

## 2013-05-18 DIAGNOSIS — I4891 Unspecified atrial fibrillation: Secondary | ICD-10-CM

## 2013-05-18 DIAGNOSIS — I10 Essential (primary) hypertension: Secondary | ICD-10-CM

## 2013-05-18 LAB — PACEMAKER DEVICE OBSERVATION

## 2013-05-18 NOTE — Assessment & Plan Note (Signed)
He is at extremely high number of episodes of paroxysmal atrial fibrillation but these are all very brief. Most of them last for less than a minute and the longest is only 5 minutes in duration. Will turn on post most atrial overdrive pacing and atrial preference pacing today but we'll delay initiation of antitachycardia therapies until his next office visit in 3 months. This may lead to further reduction in the burden of atrial fibrillation. Ventricular rate control is fair. The overall burden of atrial fibrillation does not justify adding another agent, but consider switching from amlodipine to diltiazem, if his ventricular rate control becomes an issue. Barring any bleeding contraindications, continue full anticoagulation lifelong. Discussed the relatively short duration of action of Xarelto.

## 2013-05-18 NOTE — Assessment & Plan Note (Signed)
Permanent pacemaker setting changes described above. Normally parameters.

## 2013-05-18 NOTE — Progress Notes (Signed)
Patient ID: [name], male   DOB: January 21, 1933, 80 y.o.   MRN: [id number]      Reason for office visit Paroxysmal atrial fibrillation, sinus node dysfunction, recent pacemaker implantation  [name] is a 80 year old man with a history of hypertension obstructive sleep apnea recently diagnosed with paroxysmal atrial fibrillation and sinus pauses. He underwent pacemaker implantation with a MRI conditional Medtronic Advisa dual chamber device about a month ago. He is here for pacemaker followup. He has ordered a workup is remote transmitter and performed a successful transmission. He has not been aware of palpitations but his pacemaker shows very frequent episodes of very brief paroxysmal atrial fibrillation. He has not had recent signs of embolic events but does have a history of stroke. He is now on a novel anticoagulant without any bleeding complications. He tells me today that he feels substantially more energy since device implantation. He is unaware of palpitations. He has not had any bleeding problems.  No Known Allergies  Current Outpatient Prescriptions  Medication Sig Dispense Refill  . amLODipine (NORVASC) 5 MG tablet Take 1 tablet (5 mg total) by mouth daily.  30 tablet  11  . losartan (COZAAR) 50 MG tablet Take 1 tablet (50 mg total) by mouth daily.  30 tablet  11  . Multiple Vitamin (MULTIVITAMIN WITH MINERALS) TABS tablet Take 1 tablet by mouth daily.      . Rivaroxaban (XARELTO) 15 MG TABS tablet Take 15 mg by mouth daily.      . simvastatin (ZOCOR) 40 MG tablet Take 40 mg by mouth daily.       No current facility-administered medications for this visit.    Past Medical History  Diagnosis Date  . Unspecified essential hypertension   . Other specified cardiac dysrhythmias(427.89)   . Disturbances of sulphur-bearing amino-acid metabolism   . Other and unspecified hyperlipidemia   . Malignant neoplasm of prostate   . Unspecified disorder resulting from impaired  renal function   . Osteoarthrosis, unspecified whether generalized or localized, unspecified site   . Unspecified cerebral artery occlusion with cerebral infarction   . Obstructive sleep apnea (adult) (pediatric) 2004    surgery to coorrect  . Wears glasses   . Wears partial dentures     upper partial  . Pacemaker 04/14/2013    DUAL CHAMBER PACEMAKER    Past Surgical History  Procedure Laterality Date  . Ent surgery  2004    correct snoring  . Orchiectomy  2002  . Cholecystectomy  1987  . Inguinal hernia repair      left  . Colonoscopy    . Minor irrigation and debridement of wound Right 03/04/2013    Procedure: MINOR IRRIGATION AND DEBRIDEMENT OF WOUND;  Surgeon: Theodoro Kos, DO;  Location: [facility];  Service: Plastics;  Laterality: Right;  . Insert / replace / remove pacemaker  04/14/2013    dual chamber   / Dr Sallyanne Kuster    Family History  Problem Relation Age of Onset  . Prostate cancer Father     History   Social History  . Marital Status: Married    Spouse Name: joan Husted    Number of Children: N/A  . Years of Education: N/A   Occupational History  . Retired   . RETIRED    Social History Main Topics  . Smoking status: Current Some Day Smoker -- 1.00 packs/day for 30 years    Types: Cigarettes  . Smokeless tobacco: Never Used  Comment:  pt stated he smokes 1 pack every 2 wks  . Alcohol Use: No  . Drug Use: No  . Sexual Activity: Not on file     Comment: started smoking 2-3 cig daily   Other Topics Concern  . Not on file   Social History Narrative  . No narrative on file    Review of systems: The patient specifically denies any chest pain at rest or with exertion, dyspnea at rest or with exertion, orthopnea, paroxysmal nocturnal dyspnea, syncope, palpitations, focal neurological deficits, intermittent claudication, lower extremity edema, unexplained weight gain, cough, hemoptysis or wheezing.  The patient also denies  abdominal pain, nausea, vomiting, dysphagia, diarrhea, constipation, polyuria, polydipsia, dysuria, hematuria, frequency, urgency, abnormal bleeding or bruising, fever, chills, unexpected weight changes, mood swings, change in skin or hair texture, change in voice quality, auditory or visual problems, allergic reactions or rashes, new musculoskeletal complaints other than usual "aches and pains".   PHYSICAL EXAM BP 132/90  Pulse 68  Ht '5\' 11"'  (1.803 m)  Wt 215 lb 11.2 oz (97.841 kg)  BMI 30.10 kg/m2  General: Alert, oriented x3, no distress Head: no evidence of trauma, PERRL, EOMI, no exophtalmos or lid lag, no myxedema, no xanthelasma; normal ears, nose and oropharynx Neck: normal jugular venous pulsations and no hepatojugular reflux; brisk carotid pulses without delay and no carotid bruits Chest: clear to auscultation, no signs of consolidation by percussion or palpation, normal fremitus, symmetrical and full respiratory excursions Cardiovascular: normal position and quality of the apical impulse, regular rhythm, normal first and second heart sounds, no murmurs, rubs or gallops Abdomen: no tenderness or distention, no masses by palpation, no abnormal pulsatility or arterial bruits, normal bowel sounds, no hepatosplenomegaly Extremities: no clubbing, cyanosis or edema; 2+ radial, ulnar and brachial pulses bilaterally; 2+ right femoral, posterior tibial and dorsalis pedis pulses; 2+ left femoral, posterior tibial and dorsalis pedis pulses; no subclavian or femoral bruits Neurological: grossly nonfocal   EKG: Atrial paced ventricular sensed  Lipid Panel     Component Value Date/Time   CHOL 179 07/03/2012 1302   TRIG 88.0 07/03/2012 1302   HDL 49.20 07/03/2012 1302   CHOLHDL 4 07/03/2012 1302   VLDL 17.6 07/03/2012 1302   LDLCALC 112* 07/03/2012 1302    BMET    Component Value Date/Time   NA 143 04/06/2013 1342   K 4.9 04/06/2013 1342   CL 109 04/06/2013 1342   CO2 26 04/06/2013 1342     GLUCOSE 95 04/06/2013 1342   GLUCOSE 100* 04/11/2006 0906   BUN 25* 04/06/2013 1342   CREATININE 1.98* 04/06/2013 1342   CREATININE 2.0* 12/31/2012 0940   CALCIUM 11.0* 04/06/2013 1342   GFRNONAA 42.33* 05/09/2010 1134   GFRAA  Value: 43        The eGFR has been calculated using the MDRD equation. This calculation has not been validated in all clinical situations. eGFR's persistently <60 mL/min signify possible Chronic Kidney Disease.* 02/13/2009 1250     ASSESSMENT AND PLAN Paroxysmal atrial fibrillation He is at extremely high number of episodes of paroxysmal atrial fibrillation but these are all very brief. Most of them last for less than a minute and the longest is only 5 minutes in duration. Will turn on post most atrial overdrive pacing and atrial preference pacing today but we'll delay initiation of antitachycardia therapies until his next office visit in 3 months. This may lead to further reduction in the burden of atrial fibrillation. Ventricular rate control is fair. The  overall burden of atrial fibrillation does not justify adding another agent, but consider switching from amlodipine to diltiazem, if his ventricular rate control becomes an issue. Barring any bleeding contraindications, continue full anticoagulation lifelong. Discussed the relatively short duration of action of Xarelto.  Tachy-brady syndrome Interrogation of his device shows not only persistent sinus bradycardia but also clear evidence of chronotropic incompetence. Rate response threshold was decreased and further adjustment will probably be necessary at his next appointment.  Pacemaker - Medtronic, implanted 04/14/13 Permanent pacemaker setting changes described above. Normally parameters.  HYPERTENSION Excellent blood pressure control.  the potential for adverse interaction between simvastatin amlodipine is duly noted that he has been on this combination now for quite a while without side effects. If we do need to  switch to diltiazem will have to also switch to an alternative statin.  Holli Humbles, MD, [place] [phone] office [phone] pager

## 2013-05-18 NOTE — Assessment & Plan Note (Signed)
Excellent blood pressure control 

## 2013-05-18 NOTE — Patient Instructions (Signed)
Your physician recommends that you schedule a follow-up appointment in: 3 months with Dr.Croitoru + pacemaker check.  

## 2013-05-18 NOTE — Assessment & Plan Note (Signed)
Interrogation of his device shows not only persistent sinus bradycardia but also clear evidence of chronotropic incompetence. Rate response threshold was decreased and further adjustment will probably be necessary at his next appointment.

## 2013-05-20 NOTE — Progress Notes (Signed)
Pacemaker check in clinic. Normal device function. Thresholds, sensing, impedances consistent with previous measurements. Device programmed to maximize longevity. 1170 mode switches (1.4%)---max dur. 5 mins, Max A 500, Max V 167---AF + Xarelto. SVT episodes noted---max dur. 8 mins, Max A 400, Max V 167---AF with RVR. 1 NSVT episode on 05-10-2013 x "1 sec", 136/178. APP/PMOP enabled. Outputs decreased to chronic settings---2V/2.5V. Histogram distribution appeared blunted for activity level. RR threshold was increased to a setting of "Low". Device programmed to optimize intrinsic conduction. Estimated longevity 12 years. Patient will follow up with [facility] in 3 months to review histograms and to turn on atrial therapies.

## 2013-05-26 LAB — MDC_IDC_ENUM_SESS_TYPE_INCLINIC
Battery Remaining Longevity: 12
Lead Channel Impedance Value: 494 Ohm
Lead Channel Impedance Value: 684 Ohm
Lead Channel Pacing Threshold Amplitude: 0.75 V
Lead Channel Pacing Threshold Pulse Width: 0.4 ms
Lead Channel Sensing Intrinsic Amplitude: 16.5 mV
Lead Channel Setting Pacing Amplitude: 3.5 V
Lead Channel Setting Sensing Sensitivity: 0.9 mV
MDC IDC MSMT BATTERY VOLTAGE: 3.1 V
MDC IDC MSMT LEADCHNL RA PACING THRESHOLD AMPLITUDE: 0.375 V
MDC IDC MSMT LEADCHNL RA PACING THRESHOLD PULSEWIDTH: 0.4 ms
MDC IDC MSMT LEADCHNL RA SENSING INTR AMPL: 2.6 mV
MDC IDC SET LEADCHNL RV PACING AMPLITUDE: 3.5 V
MDC IDC SET LEADCHNL RV PACING PULSEWIDTH: 0.4 ms
MDC IDC SET ZONE DETECTION INTERVAL: 400 ms
MDC IDC STAT BRADY AP VP PERCENT: 0.4 %
MDC IDC STAT BRADY AP VS PERCENT: 84.4 %
MDC IDC STAT BRADY AS VP PERCENT: 0.1 % — AB
MDC IDC STAT BRADY AS VS PERCENT: 15.1 %
Zone Setting Detection Interval: 400 ms

## 2013-08-05 ENCOUNTER — Other Ambulatory Visit: Payer: Self-pay | Admitting: Plastic Surgery

## 2013-08-05 DIAGNOSIS — S81801A Unspecified open wound, right lower leg, initial encounter: Secondary | ICD-10-CM

## 2013-08-06 ENCOUNTER — Encounter (HOSPITAL_BASED_OUTPATIENT_CLINIC_OR_DEPARTMENT_OTHER): Payer: Self-pay | Admitting: *Deleted
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
## 2013-08-07 ENCOUNTER — Telehealth: Payer: Self-pay | Admitting: Cardiovascular Disease

## 2013-08-07 ENCOUNTER — Telehealth: Payer: Self-pay | Admitting: *Deleted

## 2013-08-07 NOTE — Telephone Encounter (Signed)
I think [name] can stop his Xarelto for the planned surgery. Due to its short duration of action, it is usually stopped 48 hours before a major surgical procedure. It should be resumed as soon as the surgeon considers this safe from a bleeding standpoint - onset of action is fast, less than one hour. I was not aware that he takes Plavix and do not see an indication for it.  [name], MD, [facility] [phone] office [phone] pager

## 2013-08-07 NOTE — Telephone Encounter (Signed)
Signed perioperative prescription for implanted cardiac device programming for a Medtronic pacer faxed.

## 2013-08-07 NOTE — Telephone Encounter (Signed)
Read info.rmation to [name] she verbalized understanding.

## 2013-08-07 NOTE — Telephone Encounter (Signed)
Please call-he is scheduled for surgery 08-12-13. She need to know if he can stop his Plavix and Xarelto? If so when does he need to stop?
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
## 2013-08-10 ENCOUNTER — Encounter (HOSPITAL_BASED_OUTPATIENT_CLINIC_OR_DEPARTMENT_OTHER)
Admission: RE | Admit: 2013-08-10 | Discharge: 2013-08-10 | Disposition: A | Discharge status: Home / Self Care | Payer: Medicare HMO | Source: Ambulatory Visit | Attending: Plastic Surgery | Admitting: Plastic Surgery

## 2013-08-10 LAB — BASIC METABOLIC PANEL
BUN: 28 mg/dL — AB (ref 6–23)
CALCIUM: 9.9 mg/dL (ref 8.4–10.5)
CO2: 23 mEq/L (ref 19–32)
Chloride: 109 mEq/L (ref 96–112)
Creatinine, Ser: 2.04 mg/dL — ABNORMAL HIGH (ref 0.50–1.35)
GFR, EST AFRICAN AMERICAN: 34 mL/min — AB (ref >90)
GFR, EST NON AFRICAN AMERICAN: 30 mL/min — AB (ref >90)
Glucose, Bld: 119 mg/dL — ABNORMAL HIGH (ref 70–99)
Potassium: 4.8 mEq/L (ref 3.7–5.3)
Sodium: 146 mEq/L (ref 137–147)

## 2013-08-11 ENCOUNTER — Other Ambulatory Visit: Payer: Self-pay | Admitting: Plastic Surgery

## 2013-08-12 ENCOUNTER — Encounter (HOSPITAL_BASED_OUTPATIENT_CLINIC_OR_DEPARTMENT_OTHER): Payer: Medicare HMO | Admitting: Anesthesiology

## 2013-08-12 ENCOUNTER — Ambulatory Visit (HOSPITAL_BASED_OUTPATIENT_CLINIC_OR_DEPARTMENT_OTHER): Payer: Medicare HMO | Admitting: Anesthesiology

## 2013-08-12 ENCOUNTER — Encounter (HOSPITAL_BASED_OUTPATIENT_CLINIC_OR_DEPARTMENT_OTHER)
Admission: RE | Disposition: A | Discharge status: Home / Self Care | Payer: Self-pay | Source: Ambulatory Visit | Attending: Plastic Surgery

## 2013-08-12 ENCOUNTER — Ambulatory Visit (HOSPITAL_BASED_OUTPATIENT_CLINIC_OR_DEPARTMENT_OTHER)
Admission: RE | Admit: 2013-08-12 | Discharge: 2013-08-12 | Disposition: A | Discharge status: Home / Self Care | Payer: Medicare HMO | Source: Ambulatory Visit | Attending: Plastic Surgery | Admitting: Plastic Surgery

## 2013-08-12 ENCOUNTER — Encounter (HOSPITAL_BASED_OUTPATIENT_CLINIC_OR_DEPARTMENT_OTHER): Payer: Self-pay | Admitting: *Deleted

## 2013-08-12 DIAGNOSIS — Z7902 Long term (current) use of antithrombotics/antiplatelets: Secondary | ICD-10-CM | POA: Insufficient documentation

## 2013-08-12 DIAGNOSIS — M1990 Unspecified osteoarthritis, unspecified site: Secondary | ICD-10-CM | POA: Insufficient documentation

## 2013-08-12 DIAGNOSIS — E785 Hyperlipidemia, unspecified: Secondary | ICD-10-CM | POA: Insufficient documentation

## 2013-08-12 DIAGNOSIS — I4891 Unspecified atrial fibrillation: Secondary | ICD-10-CM | POA: Insufficient documentation

## 2013-08-12 DIAGNOSIS — Z8673 Personal history of transient ischemic attack (TIA), and cerebral infarction without residual deficits: Secondary | ICD-10-CM | POA: Insufficient documentation

## 2013-08-12 DIAGNOSIS — Z950 Presence of cardiac pacemaker: Secondary | ICD-10-CM | POA: Insufficient documentation

## 2013-08-12 DIAGNOSIS — S81801A Unspecified open wound, right lower leg, initial encounter: Secondary | ICD-10-CM

## 2013-08-12 DIAGNOSIS — G4733 Obstructive sleep apnea (adult) (pediatric): Secondary | ICD-10-CM | POA: Insufficient documentation

## 2013-08-12 DIAGNOSIS — I10 Essential (primary) hypertension: Secondary | ICD-10-CM | POA: Insufficient documentation

## 2013-08-12 DIAGNOSIS — X088XXA Exposure to other specified smoke, fire and flames, initial encounter: Secondary | ICD-10-CM | POA: Insufficient documentation

## 2013-08-12 DIAGNOSIS — F17200 Nicotine dependence, unspecified, uncomplicated: Secondary | ICD-10-CM | POA: Insufficient documentation

## 2013-08-12 DIAGNOSIS — Z8546 Personal history of malignant neoplasm of prostate: Secondary | ICD-10-CM | POA: Insufficient documentation

## 2013-08-12 DIAGNOSIS — T24009A Burn of unspecified degree of unspecified site of unspecified lower limb, except ankle and foot, initial encounter: Secondary | ICD-10-CM | POA: Insufficient documentation

## 2013-08-12 HISTORY — PX: APPLICATION OF A-CELL OF EXTREMITY: SHX6303

## 2013-08-12 HISTORY — PX: SKIN SPLIT GRAFT: SHX444

## 2013-08-12 LAB — POCT HEMOGLOBIN-HEMACUE: HEMOGLOBIN: 12.6 g/dL — AB (ref 13.0–17.0)

## 2013-08-12 SURGERY — APPLICATION, GRAFT, SKIN, SPLIT-THICKNESS
Anesthesia: General | Site: Leg Lower | Laterality: Right

## 2013-08-12 MED ORDER — PROPOFOL 10 MG/ML IV BOLUS
INTRAVENOUS | Status: DC | PRN
  Administered 2013-08-12: 200 mg via INTRAVENOUS

## 2013-08-12 MED ORDER — MIDAZOLAM HCL 2 MG/2ML IJ SOLN: 1.0000 mg | INTRAMUSCULAR | Status: DC | PRN

## 2013-08-12 MED ORDER — FENTANYL CITRATE 0.05 MG/ML IJ SOLN: 50.0000 ug | INTRAMUSCULAR | Status: DC | PRN

## 2013-08-12 MED ORDER — CEFAZOLIN SODIUM-DEXTROSE 2-3 GM-% IV SOLR
INTRAVENOUS | Status: AC
  Filled 2013-08-12: qty 50

## 2013-08-12 MED ORDER — FENTANYL CITRATE 0.05 MG/ML IJ SOLN
INTRAMUSCULAR | Status: AC
  Filled 2013-08-12: qty 6

## 2013-08-12 MED ORDER — BUPIVACAINE-EPINEPHRINE PF 0.25-1:200000 % IJ SOLN
INTRAMUSCULAR | Status: AC
  Filled 2013-08-12: qty 30

## 2013-08-12 MED ORDER — ONDANSETRON HCL 4 MG/2ML IJ SOLN
INTRAMUSCULAR | Status: DC | PRN
  Administered 2013-08-12: 4 mg via INTRAVENOUS

## 2013-08-12 MED ORDER — SODIUM CHLORIDE 0.9 % IR SOLN
Status: DC | PRN
  Administered 2013-08-12: 09:00:00

## 2013-08-12 MED ORDER — CEFAZOLIN SODIUM-DEXTROSE 2-3 GM-% IV SOLR
2.0000 g | INTRAVENOUS | Status: AC
  Administered 2013-08-12: 2 g via INTRAVENOUS

## 2013-08-12 MED ORDER — DEXAMETHASONE SODIUM PHOSPHATE 4 MG/ML IJ SOLN
INTRAMUSCULAR | Status: DC | PRN
  Administered 2013-08-12: 10 mg via INTRAVENOUS

## 2013-08-12 MED ORDER — LACTATED RINGERS IV SOLN
INTRAVENOUS | Status: DC
  Administered 2013-08-12: 08:00:00 via INTRAVENOUS

## 2013-08-12 MED ORDER — BUPIVACAINE-EPINEPHRINE PF 0.25-1:200000 % IJ SOLN
INTRAMUSCULAR | Status: DC | PRN
  Administered 2013-08-12: 10 mL via PERINEURAL

## 2013-08-12 MED ORDER — FENTANYL CITRATE 0.05 MG/ML IJ SOLN
INTRAMUSCULAR | Status: DC | PRN
  Administered 2013-08-12 (×2): 50 ug via INTRAVENOUS

## 2013-08-12 MED ORDER — FENTANYL CITRATE 0.05 MG/ML IJ SOLN: 25.0000 ug | INTRAMUSCULAR | Status: DC | PRN

## 2013-08-12 MED ORDER — MIDAZOLAM HCL 2 MG/2ML IJ SOLN
INTRAMUSCULAR | Status: AC
  Filled 2013-08-12: qty 2

## 2013-08-12 SURGICAL SUPPLY — 100 items
BAG DECANTER FOR FLEXI CONT (MISCELLANEOUS) ×3 IMPLANT
BANDAGE ELASTIC 3 VELCRO ST LF (GAUZE/BANDAGES/DRESSINGS) IMPLANT
BANDAGE ELASTIC 4 VELCRO ST LF (GAUZE/BANDAGES/DRESSINGS) ×3 IMPLANT
BANDAGE ELASTIC 6 VELCRO ST LF (GAUZE/BANDAGES/DRESSINGS) ×3 IMPLANT
BENZOIN TINCTURE PRP APPL 2/3 (GAUZE/BANDAGES/DRESSINGS) IMPLANT
BLADE DERMATOME SS (BLADE) ×3 IMPLANT
BLADE HEX COATED 2.75 (ELECTRODE) IMPLANT
BLADE MINI RND TIP GREEN BEAV (BLADE) IMPLANT
BLADE SURG 10 STRL SS (BLADE) IMPLANT
BLADE SURG 15 STRL LF DISP TIS (BLADE) ×1 IMPLANT
BLADE SURG 15 STRL SS (BLADE) ×2
BLADE SURG ROTATE 9660 (MISCELLANEOUS) IMPLANT
BNDG COHESIVE 4X5 TAN STRL (GAUZE/BANDAGES/DRESSINGS) IMPLANT
BNDG ESMARK 4X9 LF (GAUZE/BANDAGES/DRESSINGS) IMPLANT
BNDG GAUZE ELAST 4 BULKY (GAUZE/BANDAGES/DRESSINGS) ×3 IMPLANT
CANISTER SUCT 1200ML W/VALVE (MISCELLANEOUS) IMPLANT
CANISTER SUCT 3000ML (MISCELLANEOUS) IMPLANT
CHLORAPREP W/TINT 26ML (MISCELLANEOUS) ×3 IMPLANT
CLOSURE WOUND 1/2 X4 (GAUZE/BANDAGES/DRESSINGS)
CORDS BIPOLAR (ELECTRODE) IMPLANT
COTTONBALL LRG STERILE PKG (GAUZE/BANDAGES/DRESSINGS) ×9 IMPLANT
COVER MAYO STAND STRL (DRAPES) ×3 IMPLANT
COVER TABLE BACK 60X90 (DRAPES) ×3 IMPLANT
DECANTER SPIKE VIAL GLASS SM (MISCELLANEOUS) IMPLANT
DERMABOND ADVANCED (GAUZE/BANDAGES/DRESSINGS)
DERMABOND ADVANCED .7 DNX12 (GAUZE/BANDAGES/DRESSINGS) IMPLANT
DERMACARRIERS GRAFT 1 TO 1.5 (DISPOSABLE)
DRAIN PENROSE 1/2X12 LTX STRL (WOUND CARE) IMPLANT
DRAPE EXTREMITY T 121X128X90 (DRAPE) IMPLANT
DRAPE INCISE IOBAN 66X45 STRL (DRAPES) IMPLANT
DRAPE PED LAPAROTOMY (DRAPES) IMPLANT
DRAPE SURG 17X23 STRL (DRAPES) ×12 IMPLANT
DRAPE U-SHAPE 76X120 STRL (DRAPES) ×3 IMPLANT
DRSG ADAPTIC 3X8 NADH LF (GAUZE/BANDAGES/DRESSINGS) ×3 IMPLANT
DRSG EMULSION OIL 3X3 NADH (GAUZE/BANDAGES/DRESSINGS) IMPLANT
DRSG OPSITE 6X11 MED (GAUZE/BANDAGES/DRESSINGS) IMPLANT
DRSG PAD ABDOMINAL 8X10 ST (GAUZE/BANDAGES/DRESSINGS) IMPLANT
DRSG TEGADERM 4X10 (GAUZE/BANDAGES/DRESSINGS) IMPLANT
ELECT NEEDLE TIP 2.8 STRL (NEEDLE) IMPLANT
ELECT REM PT RETURN 9FT ADLT (ELECTROSURGICAL) ×3
ELECTRODE REM PT RTRN 9FT ADLT (ELECTROSURGICAL) ×1 IMPLANT
GLOVE BIO SURGEON STRL SZ 6.5 (GLOVE) ×6 IMPLANT
GLOVE BIO SURGEONS STRL SZ 6.5 (GLOVE) ×3
GOWN STRL REUS W/ TWL LRG LVL3 (GOWN DISPOSABLE) ×2 IMPLANT
GOWN STRL REUS W/TWL LRG LVL3 (GOWN DISPOSABLE) ×4
GRAFT DERMACARRIERS 1 TO 1.5 (DISPOSABLE) IMPLANT
HANDPIECE INTERPULSE COAX TIP (DISPOSABLE)
IV NS IRRIG 3000ML ARTHROMATIC (IV SOLUTION) IMPLANT
MANIFOLD NEPTUNE II (INSTRUMENTS) IMPLANT
MATRIX SURGICAL PSMX 7X10CM (Tissue) ×3 IMPLANT
NEEDLE 27GAX1X1/2 (NEEDLE) ×3 IMPLANT
NEEDLE HYPO 30GX1 BEV (NEEDLE) IMPLANT
NS IRRIG 1000ML POUR BTL (IV SOLUTION) ×3 IMPLANT
PACK BASIN DAY SURGERY FS (CUSTOM PROCEDURE TRAY) ×3 IMPLANT
PAD CAST 3X4 CTTN HI CHSV (CAST SUPPLIES) IMPLANT
PAD CAST 4YDX4 CTTN HI CHSV (CAST SUPPLIES) ×1 IMPLANT
PADDING CAST ABS 3INX4YD NS (CAST SUPPLIES)
PADDING CAST ABS 4INX4YD NS (CAST SUPPLIES) ×2
PADDING CAST ABS COTTON 3X4 (CAST SUPPLIES) IMPLANT
PADDING CAST ABS COTTON 4X4 ST (CAST SUPPLIES) ×1 IMPLANT
PADDING CAST COTTON 3X4 STRL (CAST SUPPLIES)
PADDING CAST COTTON 4X4 STRL (CAST SUPPLIES) ×2
PADDING CAST COTTON 6X4 STRL (CAST SUPPLIES) ×3 IMPLANT
PENCIL BUTTON HOLSTER BLD 10FT (ELECTRODE) IMPLANT
SET HNDPC FAN SPRY TIP SCT (DISPOSABLE) IMPLANT
SHEET MEDIUM DRAPE 40X70 STRL (DRAPES) ×3 IMPLANT
SLEEVE SCD COMPRESS KNEE MED (MISCELLANEOUS) ×3 IMPLANT
SPLINT FAST PLASTER 5X30 (CAST SUPPLIES) ×20
SPLINT PLASTER CAST FAST 5X30 (CAST SUPPLIES) ×10 IMPLANT
SPLINT PLASTER CAST XFAST 3X15 (CAST SUPPLIES) IMPLANT
SPLINT PLASTER XTRA FASTSET 3X (CAST SUPPLIES)
SPONGE GAUZE 4X4 12PLY (GAUZE/BANDAGES/DRESSINGS) ×6 IMPLANT
SPONGE GAUZE 4X4 12PLY STER LF (GAUZE/BANDAGES/DRESSINGS) IMPLANT
SPONGE LAP 18X18 X RAY DECT (DISPOSABLE) ×6 IMPLANT
SPONGE LAP 4X18 X RAY DECT (DISPOSABLE) IMPLANT
STAPLER VISISTAT 35W (STAPLE) IMPLANT
STOCKINETTE 4X48 STRL (DRAPES) IMPLANT
STOCKINETTE 6  STRL (DRAPES) ×2
STOCKINETTE 6 STRL (DRAPES) ×1 IMPLANT
STOCKINETTE IMPERVIOUS LG (DRAPES) IMPLANT
STRIP CLOSURE SKIN 1/2X4 (GAUZE/BANDAGES/DRESSINGS) IMPLANT
SUCTION FRAZIER TIP 10 FR DISP (SUCTIONS) IMPLANT
SURGILUBE 2OZ TUBE FLIPTOP (MISCELLANEOUS) IMPLANT
SUT MON AB 5-0 PS2 18 (SUTURE) IMPLANT
SUT SILK 3 0 PS 1 (SUTURE) IMPLANT
SUT SILK 3 0 SH CR/8 (SUTURE) IMPLANT
SUT SILK 4 0 SH CR/8 (SUTURE) IMPLANT
SUT VIC AB 3-0 FS2 27 (SUTURE) IMPLANT
SUT VIC AB 5-0 P-3 18X BRD (SUTURE) ×1 IMPLANT
SUT VIC AB 5-0 P3 18 (SUTURE) ×2
SUT VIC AB 5-0 PS2 18 (SUTURE) IMPLANT
SUT VICRYL 4-0 PS2 18IN ABS (SUTURE) ×12 IMPLANT
SYR BULB IRRIGATION 50ML (SYRINGE) ×3 IMPLANT
SYR CONTROL 10ML LL (SYRINGE) ×3 IMPLANT
TOWEL OR 17X24 6PK STRL BLUE (TOWEL DISPOSABLE) ×3 IMPLANT
TRAY DSU PREP LF (CUSTOM PROCEDURE TRAY) ×3 IMPLANT
TUBE CONNECTING 20'X1/4 (TUBING)
TUBE CONNECTING 20X1/4 (TUBING) IMPLANT
UNDERPAD 30X30 INCONTINENT (UNDERPADS AND DIAPERS) ×3 IMPLANT
YANKAUER SUCT BULB TIP NO VENT (SUCTIONS) IMPLANT

## 2013-08-12 NOTE — Discharge Instructions (Addendum)
Do not remove dressings   Post Anesthesia Home Care Instructions  Activity: Get plenty of rest for the remainder of the day. A responsible adult should stay with you for 24 hours following the procedure.  For the next 24 hours, DO NOT: -Drive a car -Paediatric nurse -Drink alcoholic beverages -Take any medication unless instructed by your physician -Make any legal decisions or sign important papers.  Meals: Start with liquid foods such as gelatin or soup. Progress to regular foods as tolerated. Avoid greasy, spicy, heavy foods. If nausea and/or vomiting occur, drink only clear liquids until the nausea and/or vomiting subsides. Call your physician if vomiting continues.  Special Instructions/Symptoms: Your throat may feel dry or sore from the anesthesia or the breathing tube placed in your throat during surgery. If this causes discomfort, gargle with warm salt water. The discomfort should disappear within 24 hours.
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
## 2013-08-12 NOTE — Anesthesia Procedure Notes (Signed)
Procedure Name: LMA Insertion Date/Time: 08/12/2013 8:41 AM Performed by: Melynda Ripple D Pre-anesthesia Checklist: Patient identified, Emergency Drugs available, Suction available and Patient being monitored Patient Re-evaluated:Patient Re-evaluated prior to inductionOxygen Delivery Method: Circle System Utilized Preoxygenation: Pre-oxygenation with 100% oxygen Intubation Type: IV induction Ventilation: Mask ventilation without difficulty LMA: LMA inserted LMA Size: 5.0 Number of attempts: 1 Airway Equipment and Method: bite block Placement Confirmation: positive ETCO2 Tube secured with: Tape Dental Injury: Teeth and Oropharynx as per pre-operative assessment
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
## 2013-08-12 NOTE — Anesthesia Postprocedure Evaluation (Signed)
  Anesthesia Post-op Note  Patient: [name]  Procedure(s) Performed: Procedure(s): SKIN GRAFT SPLIT THICKNESS WITH PLACEMENT OF VAC TO RIGHT LOWER LEG/PLACEMENT OF ACELL TO RIGHT UPPER THIGH AREA (HARVEST SITE) (Right) APPLICATION OF A-CELL OF EXTREMITY (Right)  Patient Location: PACU  Anesthesia Type:General  Level of Consciousness: awake and alert   Airway and Oxygen Therapy: Patient Spontanous Breathing  Post-op Pain: none  Post-op Assessment: Post-op Vital signs reviewed, Patient's Cardiovascular Status Stable and Respiratory Function Stable  Post-op Vital Signs: Reviewed  Filed Vitals:   08/12/13 1015  BP: 146/84  Pulse: 59  Temp:   Resp: 15    Complications: No apparent anesthesia complications
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
## 2013-08-12 NOTE — Transfer of Care (Signed)
Immediate Anesthesia Transfer of Care Note  Patient: [name]  Procedure(s) Performed: Procedure(s): SKIN GRAFT SPLIT THICKNESS WITH PLACEMENT OF VAC TO RIGHT LOWER LEG/PLACEMENT OF ACELL TO RIGHT UPPER THIGH AREA (HARVEST SITE) (Right) APPLICATION OF A-CELL OF EXTREMITY (Right)  Patient Location: PACU  Anesthesia Type:General  Level of Consciousness: awake, alert  and oriented  Airway & Oxygen Therapy: Patient Spontanous Breathing and Patient connected to face mask oxygen  Post-op Assessment: Report given to PACU RN and Post -op Vital signs reviewed and stable  Post vital signs: Reviewed and stable  Complications: No apparent anesthesia complications

## 2013-08-12 NOTE — Anesthesia Preprocedure Evaluation (Signed)
Anesthesia Evaluation  Patient identified by MRN, date of birth, ID band Patient awake    Reviewed: Allergy & Precautions, H&P , NPO status , Patient's Chart, lab work & pertinent test results  Airway Mallampati: III TM Distance: >3 FB Neck ROM: Full    Dental no notable dental hx. (+) Teeth Intact, Partial Upper, Dental Advisory Given   Pulmonary sleep apnea , Current Smoker,  breath sounds clear to auscultation  Pulmonary exam normal       Cardiovascular hypertension, On Medications + dysrhythmias Atrial Fibrillation + pacemaker Rhythm:Regular Rate:Normal     Neuro/Psych CVA negative psych ROS   GI/Hepatic negative GI ROS, Neg liver ROS,   Endo/Other  negative endocrine ROS  Renal/GU Renal disease  negative genitourinary   Musculoskeletal   Abdominal   Peds  Hematology negative hematology ROS (+)   Anesthesia Other Findings   Reproductive/Obstetrics negative OB ROS                           Anesthesia Physical Anesthesia Plan  ASA: III  Anesthesia Plan: General   Post-op Pain Management:    Induction: Intravenous  Airway Management Planned: LMA  Additional Equipment:   Intra-op Plan:   Post-operative Plan: Extubation in OR  Informed Consent: I have reviewed the patients History and Physical, chart, labs and discussed the procedure including the risks, benefits and alternatives for the proposed anesthesia with the patient or authorized representative who has indicated his/her understanding and acceptance.   Dental advisory given  Plan Discussed with: CRNA  Anesthesia Plan Comments:         Anesthesia Quick Evaluation

## 2013-08-17 ENCOUNTER — Encounter (HOSPITAL_BASED_OUTPATIENT_CLINIC_OR_DEPARTMENT_OTHER): Payer: Self-pay | Admitting: Plastic Surgery

## 2013-08-21 ENCOUNTER — Other Ambulatory Visit: Payer: Self-pay | Admitting: Pulmonary Disease

## 2013-09-01 ENCOUNTER — Encounter: Payer: Self-pay | Admitting: Family Medicine

## 2013-09-01 ENCOUNTER — Ambulatory Visit (INDEPENDENT_AMBULATORY_CARE_PROVIDER_SITE_OTHER): Payer: Medicare HMO | Admitting: Family Medicine

## 2013-09-01 ENCOUNTER — Telehealth: Payer: Self-pay | Admitting: Family Medicine

## 2013-09-01 VITALS — BP 144/90 | HR 74 | Temp 97.5°F | Wt 225.0 lb

## 2013-09-01 DIAGNOSIS — C61 Malignant neoplasm of prostate: Secondary | ICD-10-CM

## 2013-09-01 DIAGNOSIS — I4891 Unspecified atrial fibrillation: Secondary | ICD-10-CM

## 2013-09-01 DIAGNOSIS — I10 Essential (primary) hypertension: Secondary | ICD-10-CM

## 2013-09-01 DIAGNOSIS — N259 Disorder resulting from impaired renal tubular function, unspecified: Secondary | ICD-10-CM

## 2013-09-01 DIAGNOSIS — I480 Paroxysmal atrial fibrillation: Secondary | ICD-10-CM

## 2013-09-01 DIAGNOSIS — E785 Hyperlipidemia, unspecified: Secondary | ICD-10-CM

## 2013-09-01 MED ORDER — RIVAROXABAN 15 MG PO TABS: 15.0000 mg | ORAL_TABLET | Freq: Every day | ORAL | Status: DC

## 2013-09-01 MED ORDER — SIMVASTATIN 40 MG PO TABS: 40.0000 mg | ORAL_TABLET | Freq: Every day | ORAL | Status: DC

## 2013-09-01 MED ORDER — AMLODIPINE BESYLATE 5 MG PO TABS: ORAL_TABLET | ORAL | Status: DC

## 2013-09-01 MED ORDER — LOSARTAN POTASSIUM 50 MG PO TABS
50.0000 mg | ORAL_TABLET | Freq: Every day | ORAL | Status: DC
Start: 2013-09-01 — End: 2014-12-31

## 2013-09-01 MED ORDER — CLOPIDOGREL BISULFATE 75 MG PO TABS: 75.0000 mg | ORAL_TABLET | Freq: Every day | ORAL | Status: DC

## 2013-09-01 NOTE — Progress Notes (Signed)
Pre visit review using our clinic review tool, if applicable. No additional management support is needed unless otherwise documented below in the visit note. 

## 2013-09-01 NOTE — Patient Instructions (Signed)
Stop Plavix and Start Xarelto 15 mg once daily
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
## 2013-09-01 NOTE — Telephone Encounter (Signed)
Relevant patient education mailed to patient.  

## 2013-09-02 ENCOUNTER — Telehealth: Payer: Self-pay | Admitting: Family Medicine

## 2013-09-02 NOTE — Telephone Encounter (Signed)
Relevant patient education mailed to patient.  

## 2013-10-16 ENCOUNTER — Encounter: Payer: Self-pay | Admitting: *Deleted

## 2013-11-03 ENCOUNTER — Encounter: Payer: Medicare HMO | Admitting: Family Medicine

## 2013-11-11 ENCOUNTER — Ambulatory Visit (INDEPENDENT_AMBULATORY_CARE_PROVIDER_SITE_OTHER): Payer: Medicare HMO | Admitting: Family Medicine

## 2013-11-11 ENCOUNTER — Encounter: Payer: Self-pay | Admitting: Family Medicine

## 2013-11-11 VITALS — BP 136/90 | HR 64 | Temp 97.6°F | Ht 71.0 in | Wt 219.0 lb

## 2013-11-11 DIAGNOSIS — Z23 Encounter for immunization: Secondary | ICD-10-CM

## 2013-11-11 DIAGNOSIS — E669 Obesity, unspecified: Secondary | ICD-10-CM | POA: Insufficient documentation

## 2013-11-11 DIAGNOSIS — I4891 Unspecified atrial fibrillation: Secondary | ICD-10-CM

## 2013-11-11 DIAGNOSIS — N259 Disorder resulting from impaired renal tubular function, unspecified: Secondary | ICD-10-CM

## 2013-11-11 DIAGNOSIS — I480 Paroxysmal atrial fibrillation: Secondary | ICD-10-CM

## 2013-11-11 DIAGNOSIS — E785 Hyperlipidemia, unspecified: Secondary | ICD-10-CM

## 2013-11-11 DIAGNOSIS — Z0000 Encounter for general adult medical examination without abnormal findings: Secondary | ICD-10-CM

## 2013-11-11 DIAGNOSIS — I10 Essential (primary) hypertension: Secondary | ICD-10-CM

## 2013-11-11 LAB — LIPID PANEL
CHOLESTEROL: 155 mg/dL (ref 0–200)
HDL: 51.3 mg/dL (ref >39.00)
LDL Cholesterol: 82 mg/dL (ref 0–99)
NonHDL: 103.7
TRIGLYCERIDES: 108 mg/dL (ref 0.0–149.0)
Total CHOL/HDL Ratio: 3
VLDL: 21.6 mg/dL (ref 0.0–40.0)

## 2013-11-11 LAB — HEPATIC FUNCTION PANEL
ALT: 19 U/L (ref 0–53)
AST: 24 U/L (ref 0–37)
Albumin: 3.7 g/dL (ref 3.5–5.2)
Alkaline Phosphatase: 94 U/L (ref 39–117)
BILIRUBIN DIRECT: 0 mg/dL (ref 0.0–0.3)
TOTAL PROTEIN: 7.4 g/dL (ref 6.0–8.3)
Total Bilirubin: 0.3 mg/dL (ref 0.2–1.2)

## 2013-11-11 LAB — BASIC METABOLIC PANEL
BUN: 26 mg/dL — ABNORMAL HIGH (ref 6–23)
CO2: 25 mEq/L (ref 19–32)
CREATININE: 1.9 mg/dL — AB (ref 0.4–1.5)
Calcium: 10.2 mg/dL (ref 8.4–10.5)
Chloride: 113 mEq/L — ABNORMAL HIGH (ref 96–112)
GFR: 44.24 mL/min — ABNORMAL LOW (ref >60.00)
Glucose, Bld: 107 mg/dL — ABNORMAL HIGH (ref 70–99)
Potassium: 4.5 mEq/L (ref 3.5–5.1)
SODIUM: 143 meq/L (ref 135–145)

## 2013-11-11 NOTE — Patient Instructions (Signed)
Continue with yearly flu vaccine Consider Hemoccult cards which were given Try to quit smoking    Smoking Cessation Quitting smoking is important to your health and has many advantages. However, it is not always easy to quit since nicotine is a very addictive drug. Often times, people try 3 times or more before being able to quit. This document explains the best ways for you to prepare to quit smoking. Quitting takes hard work and a lot of effort, but you can do it. ADVANTAGES OF QUITTING SMOKING  You will live longer, feel better, and live better.  Your body will feel the impact of quitting smoking almost immediately.  Within 20 minutes, blood pressure decreases. Your pulse returns to its normal level.  After 8 hours, carbon monoxide levels in the blood return to normal. Your oxygen level increases.  After 24 hours, the chance of having a heart attack starts to decrease. Your breath, hair, and body stop smelling like smoke.  After 48 hours, damaged nerve endings begin to recover. Your sense of taste and smell improve.  After 72 hours, the body is virtually free of nicotine. Your bronchial tubes relax and breathing becomes easier.  After 2 to 12 weeks, lungs can hold more air. Exercise becomes easier and circulation improves.  The risk of having a heart attack, stroke, cancer, or lung disease is greatly reduced.  After 1 year, the risk of coronary heart disease is cut in half.  After 5 years, the risk of stroke falls to the same as a nonsmoker.  After 10 years, the risk of lung cancer is cut in half and the risk of other cancers decreases significantly.  After 15 years, the risk of coronary heart disease drops, usually to the level of a nonsmoker.  If you are pregnant, quitting smoking will improve your chances of having a healthy baby.  The people you live with, especially any children, will be healthier.  You will have extra money to spend on things other than  cigarettes. QUESTIONS TO THINK ABOUT BEFORE ATTEMPTING TO QUIT You may want to talk about your answers with your caregiver.  Why do you want to quit?  If you tried to quit in the past, what helped and what did not?  What will be the most difficult situations for you after you quit? How will you plan to handle them?  Who can help you through the tough times? Your family? Friends? A caregiver?  What pleasures do you get from smoking? What ways can you still get pleasure if you quit? Here are some questions to ask your caregiver:  How can you help me to be successful at quitting?  What medicine do you think would be best for me and how should I take it?  What should I do if I need more help?  What is smoking withdrawal like? How can I get information on withdrawal? GET READY  Set a quit date.  Change your environment by getting rid of all cigarettes, ashtrays, matches, and lighters in your home, car, or work. Do not let people smoke in your home.  Review your past attempts to quit. Think about what worked and what did not. GET SUPPORT AND ENCOURAGEMENT You have a better chance of being successful if you have help. You can get support in many ways.  Tell your family, friends, and co-workers that you are going to quit and need their support. Ask them not to smoke around you.  Get individual, group, or telephone  counseling and support. Programs are available at [employer] and health centers. Call your local health department for information about programs in your area.  Spiritual beliefs and practices may help some smokers quit.  Download a "quit meter" on your computer to keep track of quit statistics, such as how long you have gone without smoking, cigarettes not smoked, and money saved.  Get a self-help book about quitting smoking and staying off of tobacco. Gantt yourself from urges to smoke. Talk to someone, go for a walk, or occupy  your time with a task.  Change your normal routine. Take a different route to work. Drink tea instead of coffee. Eat breakfast in a different place.  Reduce your stress. Take a hot bath, exercise, or read a book.  Plan something enjoyable to do every day. Reward yourself for not smoking.  Explore interactive web-based programs that specialize in helping you quit. GET MEDICINE AND USE IT CORRECTLY Medicines can help you stop smoking and decrease the urge to smoke. Combining medicine with the above behavioral methods and support can greatly increase your chances of successfully quitting smoking.  Nicotine replacement therapy helps deliver nicotine to your body without the negative effects and risks of smoking. Nicotine replacement therapy includes nicotine gum, lozenges, inhalers, nasal sprays, and skin patches. Some may be available over-the-counter and others require a prescription.  Antidepressant medicine helps people abstain from smoking, but how this works is unknown. This medicine is available by prescription.  Nicotinic receptor partial agonist medicine simulates the effect of nicotine in your brain. This medicine is available by prescription. Ask your caregiver for advice about which medicines to use and how to use them based on your health history. Your caregiver will tell you what side effects to look out for if you choose to be on a medicine or therapy. Carefully read the information on the package. Do not use any other product containing nicotine while using a nicotine replacement product.  RELAPSE OR DIFFICULT SITUATIONS Most relapses occur within the first 3 months after quitting. Do not be discouraged if you start smoking again. Remember, most people try several times before finally quitting. You may have symptoms of withdrawal because your body is used to nicotine. You may crave cigarettes, be irritable, feel very hungry, cough often, get headaches, or have difficulty concentrating.  The withdrawal symptoms are only temporary. They are strongest when you first quit, but they will go away within 10-14 days. To reduce the chances of relapse, try to:  Avoid drinking alcohol. Drinking lowers your chances of successfully quitting.  Reduce the amount of caffeine you consume. Once you quit smoking, the amount of caffeine in your body increases and can give you symptoms, such as a rapid heartbeat, sweating, and anxiety.  Avoid smokers because they can make you want to smoke.  Do not let weight gain distract you. Many smokers will gain weight when they quit, usually less than 10 pounds. Eat a healthy diet and stay active. You can always lose the weight gained after you quit.  Find ways to improve your mood other than smoking. FOR MORE INFORMATION  www.smokefree.gov  Document Released: 04/24/2001 Document Revised: 10/30/2011 Document Reviewed: 08/09/2011 [employer] Patient Information 2015 [place], [place]. This information is not intended to replace advice given to you by your health care provider. Make sure you discuss any questions you have with your health care provider.

## 2013-11-11 NOTE — Progress Notes (Signed)
Subjective:    Patient ID: [name], male    DOB: 10-16-1932, 80 y.o.   MRN: [id number]  HPI Patient here for Medicare wellness exam and medical followup. He has history of chronic anticoagulation secondary to atrial fibrillation, hyperlipidemia, hypertension, history of metastatic prostate cancer, obstructive sleep apnea, renal insufficiency, history of cerebrovascular disease, tachybradycardia syndrome with pacemaker  Medications reviewed. Compliant with all. Blood pressure stable. No recent chest pains. No history of shingles vaccine. No Prevnar 13. Tetanus up-to-date. Still smokes and low motivation to quit.  Reviewed with no change:   Past Medical History  Diagnosis Date  . Unspecified essential hypertension   . Other specified cardiac dysrhythmias(427.89)   . Disturbances of sulphur-bearing amino-acid metabolism   . Other and unspecified hyperlipidemia   . Malignant neoplasm of prostate   . Unspecified disorder resulting from impaired renal function   . Osteoarthrosis, unspecified whether generalized or localized, unspecified site   . Unspecified cerebral artery occlusion with cerebral infarction   . Obstructive sleep apnea (adult) (pediatric) 2004    surgery to coorrect  . Wears glasses   . Wears partial dentures     upper partial  . Pacemaker 04/14/2013    DUAL CHAMBER PACEMAKER   Past Surgical History  Procedure Laterality Date  . Ent surgery  2004    correct snoring  . Orchiectomy  2002  . Cholecystectomy  1987  . Inguinal hernia repair      left  . Colonoscopy    . Minor irrigation and debridement of wound Right 03/04/2013    Procedure: MINOR IRRIGATION AND DEBRIDEMENT OF WOUND;  Surgeon: Theodoro Kos, DO;  Location: [facility];  Service: Plastics;  Laterality: Right;  . Insert / replace / remove pacemaker  04/14/2013    dual chamber   / Dr Sallyanne Kuster  . Skin split graft Right 08/12/2013    Procedure: SKIN GRAFT SPLIT THICKNESS WITH  PLACEMENT OF VAC TO RIGHT LOWER LEG/PLACEMENT OF ACELL TO RIGHT UPPER THIGH AREA (HARVEST SITE);  Surgeon: Theodoro Kos, DO;  Location: [facility];  Service: Plastics;  Laterality: Right;  . Application of a-cell of extremity Right 08/12/2013    Procedure: APPLICATION OF A-CELL OF EXTREMITY;  Surgeon: Theodoro Kos, DO;  Location: [facility];  Service: Plastics;  Laterality: Right;    reports that he has been smoking Cigarettes.  He has a 15 pack-year smoking history. He has never used smokeless tobacco. He reports that he does not drink alcohol or use illicit drugs. family history includes Prostate cancer in his father. No Known Allergies  1.  Risk factors based on Past Medical , Social, and Family history reviewed and as above 2.  Limitations in physical activities very active. No recent falls 3.  Depression/mood no depression or anxiety issues 4.  Hearing stable 5.  ADLs independent in all 6.  Cognitive function (orientation to time and place, language, writing, speech,memory) memory intact. Language and judgment intact 7.  Home Safety no issues 8.  Height, weight, and visual acuity. Stable 9.  Counseling we discussed smoking cessation. His current motivation is fairly low 10. Recommendation of preventive services. Prevnar 13. Continue yearly flu vaccine. Hemoccult cards given 11. Labs based on risk factors lipid, hepatic, basic metabolic panel 12. Care Plan -recommend regular yearly flu vaccine.  Hemoccult cards given. We discussed pros and cons of repeat colonoscopy and he is on chronic anticoagulation.     Review of Systems  Constitutional: Negative  for fatigue.  Eyes: Negative for visual disturbance.  Respiratory: Negative for cough, chest tightness and shortness of breath.   Cardiovascular: Negative for chest pain, palpitations and leg swelling.  Endocrine: Negative for polydipsia and polyuria.  Neurological: Negative for dizziness, syncope,  weakness, light-headedness and headaches.  Psychiatric/Behavioral: Negative for dysphoric mood.       Objective:   Physical Exam  Constitutional: He is oriented to person, place, and time. He appears well-developed and well-nourished. No distress.  HENT:  Head: Normocephalic and atraumatic.  Right Ear: External ear normal.  Left Ear: External ear normal.  Mouth/Throat: Oropharynx is clear and moist.  Eyes: Conjunctivae and EOM are normal. Pupils are equal, round, and reactive to light.  Neck: Normal range of motion. Neck supple. No thyromegaly present.  Cardiovascular: Normal rate, regular rhythm and normal heart sounds.   No murmur heard. Pulmonary/Chest: No respiratory distress. He has no wheezes. He has no rales.  Abdominal: Soft. Bowel sounds are normal. He exhibits no distension and no mass. There is no tenderness. There is no rebound and no guarding.  Musculoskeletal: He exhibits no edema.  Lymphadenopathy:    He has no cervical adenopathy.  Neurological: He is alert and oriented to person, place, and time. He displays normal reflexes. No cranial nerve deficit.  Skin: No rash noted.  Psychiatric: He has a normal mood and affect.          Assessment & Plan:  #1 health maintenance. Prevnar 13 given. Check on coverage for shingles vaccine. Continue yearly flu vaccine. Hemoccult cards given. #2 hypertension stable. Recheck basic metabolic panel #3 hyperlipidemia. Check lipid and hepatic panel #4 chronic anticoagulation (a fib) with Xarelto.

## 2013-11-11 NOTE — Progress Notes (Signed)
Pre visit review using our clinic review tool, if applicable. No additional management support is needed unless otherwise documented below in the visit note. 

## 2013-12-22 ENCOUNTER — Ambulatory Visit (INDEPENDENT_AMBULATORY_CARE_PROVIDER_SITE_OTHER): Payer: Medicare HMO | Admitting: Cardiovascular Disease

## 2013-12-22 ENCOUNTER — Encounter: Payer: Self-pay | Admitting: Cardiovascular Disease

## 2013-12-22 VITALS — BP 140/90 | HR 81 | Resp 16 | Ht 71.0 in | Wt 219.3 lb

## 2013-12-22 DIAGNOSIS — I10 Essential (primary) hypertension: Secondary | ICD-10-CM

## 2013-12-22 DIAGNOSIS — I498 Other specified cardiac arrhythmias: Secondary | ICD-10-CM

## 2013-12-22 DIAGNOSIS — I480 Paroxysmal atrial fibrillation: Secondary | ICD-10-CM

## 2013-12-22 DIAGNOSIS — I4891 Unspecified atrial fibrillation: Secondary | ICD-10-CM

## 2013-12-22 DIAGNOSIS — E785 Hyperlipidemia, unspecified: Secondary | ICD-10-CM

## 2013-12-22 DIAGNOSIS — I455 Other specified heart block: Secondary | ICD-10-CM

## 2013-12-22 DIAGNOSIS — M1990 Unspecified osteoarthritis, unspecified site: Secondary | ICD-10-CM

## 2013-12-22 DIAGNOSIS — Z950 Presence of cardiac pacemaker: Secondary | ICD-10-CM

## 2013-12-22 DIAGNOSIS — I495 Sick sinus syndrome: Secondary | ICD-10-CM

## 2013-12-22 DIAGNOSIS — I6350 Cerebral infarction due to unspecified occlusion or stenosis of unspecified cerebral artery: Secondary | ICD-10-CM

## 2013-12-22 DIAGNOSIS — Z7901 Long term (current) use of anticoagulants: Secondary | ICD-10-CM

## 2013-12-22 MED ORDER — RIVAROXABAN 15 MG PO TABS: 15.0000 mg | ORAL_TABLET | Freq: Every day | ORAL | Status: DC

## 2013-12-22 MED ORDER — METOPROLOL TARTRATE 25 MG PO TABS: 25.0000 mg | ORAL_TABLET | Freq: Two times a day (BID) | ORAL | Status: DC

## 2013-12-22 NOTE — Patient Instructions (Addendum)
Dr Sallyanne Kuster has recommended making the following medication changes:  START Metoprolol Tartrate 25 mg - take 1 tablet by mouth twice daily  Remote monitoring is used to monitor your pacemaker from home. This monitoring reduces the number of office visits required to check your device to one time per year. It allows [place] to keep an eye on the functioning of your device to ensure it is working properly. You are scheduled for a device check from home on 03-25-2014. You may send your transmission at any time that day. If you have a wireless device, the transmission will be sent automatically. After your physician reviews your transmission, you will receive a postcard with your next transmission date.  Your physician recommends that you schedule a follow-up appointment in: 12 months with Dr.Croitoru  Please schedule an appointment with Dr Gwenlyn Found first available.

## 2013-12-22 NOTE — Progress Notes (Signed)
Patient ID: [name], male   DOB: [birth year], 80 y.o.   MRN: [id number]     Reason for office visit Paroxysmal atrial fibrillation, sinus node dysfunction, follow up pacemaker    [name] is a 80 year old man with a history of hypertension, obstructive sleep apnea, hyperlipidemia, history of metastatic prostate cancer, obstructive sleep apnea, renal insufficiency, history of cerebrovascular disease, tachybradycardia syndrome with pacemaker implanted in late 2014 (MRI conditional Medtronic Advisa dual chamber device)  He has not been aware of palpitations but his pacemaker shows very frequent episodes of very brief paroxysmal atrial fibrillation. He has not had recent signs of embolic events but does have a history of stroke. He is tolerating the anticoagulant without any bleeding complications.   Device check shows 91% A pacing, only 6% V pacing and 0.6% AF burden, with satisfactory rate control. He is very active - consistently 4-5 hours daily, a little less in June, when his son was critically ill. His longest AF episode (about 3 hours) also happened around that time   No Known Allergies  Current Outpatient Prescriptions  Medication Sig Dispense Refill  . amLODipine (NORVASC) 5 MG tablet take 1 tablet by mouth once daily  90 tablet  3  . losartan (COZAAR) 50 MG tablet Take 1 tablet (50 mg total) by mouth daily.  90 tablet  3  . Multiple Vitamin (MULTIVITAMIN WITH MINERALS) TABS tablet Take 1 tablet by mouth daily.      . Rivaroxaban (XARELTO) 15 MG TABS tablet Take 1 tablet (15 mg total) by mouth daily with supper.  90 tablet  3  . simvastatin (ZOCOR) 40 MG tablet Take 1 tablet (40 mg total) by mouth daily.  90 tablet  3  . metoprolol tartrate (LOPRESSOR) 25 MG tablet Take 1 tablet (25 mg total) by mouth 2 (two) times daily.  60 tablet  1   No current facility-administered medications for this visit.    Past Medical History  Diagnosis Date  . Unspecified essential  hypertension   . Other specified cardiac dysrhythmias(427.89)   . Disturbances of sulphur-bearing amino-acid metabolism   . Other and unspecified hyperlipidemia   . Malignant neoplasm of prostate   . Unspecified disorder resulting from impaired renal function   . Osteoarthrosis, unspecified whether generalized or localized, unspecified site   . Unspecified cerebral artery occlusion with cerebral infarction   . Obstructive sleep apnea (adult) (pediatric) 2004    surgery to coorrect  . Wears glasses   . Wears partial dentures     upper partial  . Pacemaker 04/14/2013    DUAL CHAMBER PACEMAKER    Past Surgical History  Procedure Laterality Date  . Ent surgery  2004    correct snoring  . Orchiectomy  2002  . Cholecystectomy  1987  . Inguinal hernia repair      left  . Colonoscopy    . Minor irrigation and debridement of wound Right 03/04/2013    Procedure: MINOR IRRIGATION AND DEBRIDEMENT OF WOUND;  Surgeon: Theodoro Kos, DO;  Location: [place];  Service: Plastics;  Laterality: Right;  . Insert / replace / remove pacemaker  04/14/2013    dual chamber   / Dr Sallyanne Kuster  . Skin split graft Right 08/12/2013    Procedure: SKIN GRAFT SPLIT THICKNESS WITH PLACEMENT OF VAC TO RIGHT LOWER LEG/PLACEMENT OF ACELL TO RIGHT UPPER THIGH AREA (HARVEST SITE);  Surgeon: Theodoro Kos, DO;  Location: [place];  Service: Plastics;  Laterality: Right;  .  Application of a-cell of extremity Right 08/12/2013    Procedure: APPLICATION OF A-CELL OF EXTREMITY;  Surgeon: Theodoro Kos, DO;  Location: [place];  Service: Plastics;  Laterality: Right;    Family History  Problem Relation Age of Onset  . Prostate cancer Father     History   Social History  . Marital Status: Married    Spouse Name: joan Negrete    Number of Children: N/A  . Years of Education: N/A   Occupational History  . Retired   . RETIRED    Social History Main Topics  . Smoking  status: Current Some Day Smoker -- 0.50 packs/day for 30 years    Types: Cigarettes  . Smokeless tobacco: Never Used     Comment:  pt stated he smokes 1 pack every 2 wks  . Alcohol Use: No  . Drug Use: No  . Sexual Activity: Not on file     Comment: started smoking 2-3 cig daily   Other Topics Concern  . Not on file   Social History Narrative  . No narrative on file    Review of systems: The patient specifically denies any chest pain at rest or with exertion, dyspnea at rest or with exertion, orthopnea, paroxysmal nocturnal dyspnea, syncope, palpitations, focal neurological deficits, intermittent claudication, lower extremity edema, unexplained weight gain, cough, hemoptysis or wheezing.  The patient also denies abdominal pain, nausea, vomiting, dysphagia, diarrhea, constipation, polyuria, polydipsia, dysuria, hematuria, frequency, urgency, abnormal bleeding or bruising, fever, chills, unexpected weight changes, mood swings, change in skin or hair texture, change in voice quality, auditory or visual problems, allergic reactions or rashes, new musculoskeletal complaints other than usual "aches and pains".   PHYSICAL EXAM BP 140/90  Pulse 81  Resp 16  Ht 5\' 11"  (1.803 m)  Wt 219 lb 4.8 oz (99.474 kg)  BMI 30.60 kg/m2  General: Alert, oriented x3, no distress Head: no evidence of trauma, PERRL, EOMI, no exophtalmos or lid lag, no myxedema, no xanthelasma; normal ears, nose and oropharynx Neck: normal jugular venous pulsations and no hepatojugular reflux; brisk carotid pulses without delay and no carotid bruits Chest: clear to auscultation, no signs of consolidation by percussion or palpation, normal fremitus, symmetrical and full respiratory excursions Cardiovascular: normal position and quality of the apical impulse, regular rhythm, normal first and second heart sounds, no murmurs, rubs or gallops Abdomen: no tenderness or distention, no masses by palpation, no abnormal pulsatility  or arterial bruits, normal bowel sounds, no hepatosplenomegaly Extremities: no clubbing, cyanosis or edema; 2+ radial, ulnar and brachial pulses bilaterally; 2+ right femoral, posterior tibial and dorsalis pedis pulses; 2+ left femoral, posterior tibial and dorsalis pedis pulses; no subclavian or femoral bruits Neurological: grossly nonfocal   EKG: A paced V sensed, lateraa\l ST depression and T inversion, similar to older tracings  Lipid Panel     Component Value Date/Time   CHOL 155 11/11/2013 0944   TRIG 108.0 11/11/2013 0944   HDL 51.30 11/11/2013 0944   CHOLHDL 3 11/11/2013 0944   VLDL 21.6 11/11/2013 0944   LDLCALC 82 11/11/2013 0944    BMET    Component Value Date/Time   NA 143 11/11/2013 0944   K 4.5 11/11/2013 0944   CL 113* 11/11/2013 0944   CO2 25 11/11/2013 0944   GLUCOSE 107* 11/11/2013 0944   GLUCOSE 100* 04/11/2006 0906   BUN 26* 11/11/2013 0944   CREATININE 1.9* 11/11/2013 0944   CREATININE 1.98* 04/06/2013 1342   CALCIUM 10.2 11/11/2013  [place] 30* 08/10/2013 1430   GFRAA 34* 08/10/2013 1430     ASSESSMENT AND PLAN No problem-specific assessment & plan notes found for this encounter.  Paroxysmal atrial fibrillation  High number of episodes of paroxysmal atrial fibrillation but most are very brief. Ventricular rate control is fair. The overall burden of atrial fibrillation does not justify adding another agent, but consider switching from amlodipine to diltiazem, if his ventricular rate control becomes an issue.  Barring any bleeding contraindications, continue full anticoagulation lifelong. Reviewed the relatively short duration of action of Xarelto.   Tachy-brady syndrome  Interrogation of his device shows not only persistent sinus bradycardia but also clear evidence of chronotropic incompetence. Rate response sensor settings appear appropriate  Pacemaker - Medtronic, implanted 04/14/13  Normal lead and battery parameters. Carelink Q3 months  HYPERTENSION  Fair blood  pressure control. (the potential for adverse interaction between simvastatin amlodipine is duly noted; he has been on this combination now for quite a while without side effects. If we do need to switch to diltiazem will have to also switch to an alternative statin).  Hyperlipidemia Excellent lipid profile  Orders Placed This Encounter  Procedures  . EKG 12-Lead   Meds ordered this encounter  Medications  . DISCONTD: Rivaroxaban (XARELTO) 15 MG TABS tablet    Sig: Take 1 tablet (15 mg total) by mouth daily with supper.    Dispense:  90 tablet    Refill:  3  . DISCONTD: metoprolol tartrate (LOPRESSOR) 25 MG tablet    Sig: Take 1 tablet (25 mg total) by mouth 2 (two) times daily.    Dispense:  180 tablet    Refill:  3  . metoprolol tartrate (LOPRESSOR) 25 MG tablet    Sig: Take 1 tablet (25 mg total) by mouth 2 (two) times daily.    Dispense:  60 tablet    Refill:  1  . Rivaroxaban (XARELTO) 15 MG TABS tablet    Sig: Take 1 tablet (15 mg total) by mouth daily with supper.    Dispense:  90 tablet    Refill:  [place] Rosaura Bolon, MD, [facility] [phone] office [phone] pager

## 2013-12-23 LAB — MDC_IDC_ENUM_SESS_TYPE_INCLINIC
Battery Remaining Longevity: 10
Battery Voltage: 3.03 V
Brady Statistic AP VP Percent: 6.1 %
Brady Statistic AP VS Percent: 91.3 %
Brady Statistic AS VP Percent: 0.1 % — CL
Lead Channel Impedance Value: 494 Ohm
Lead Channel Impedance Value: 532 Ohm
Lead Channel Pacing Threshold Pulse Width: 0.4 ms
Lead Channel Sensing Intrinsic Amplitude: 15.3 mV
Lead Channel Sensing Intrinsic Amplitude: 4.1 mV
Lead Channel Setting Pacing Amplitude: 2 V
Lead Channel Setting Pacing Pulse Width: 0.4 ms
Lead Channel Setting Sensing Sensitivity: 0.9 mV
MDC IDC MSMT LEADCHNL RA PACING THRESHOLD AMPLITUDE: 0.75 V
MDC IDC MSMT LEADCHNL RA PACING THRESHOLD PULSEWIDTH: 0.4 ms
MDC IDC MSMT LEADCHNL RV PACING THRESHOLD AMPLITUDE: 1.25 V
MDC IDC SET LEADCHNL RV PACING AMPLITUDE: 2.5 V
MDC IDC STAT BRADY AS VS PERCENT: 2.6 %
Zone Setting Detection Interval: 400 ms
Zone Setting Detection Interval: 400 ms

## 2014-02-10 ENCOUNTER — Encounter: Payer: Self-pay | Admitting: Cardiovascular Disease

## 2014-02-10 ENCOUNTER — Ambulatory Visit (INDEPENDENT_AMBULATORY_CARE_PROVIDER_SITE_OTHER): Payer: Commercial Managed Care - HMO | Admitting: Cardiovascular Disease

## 2014-02-10 VITALS — BP 120/88 | HR 69 | Ht 71.0 in | Wt 221.6 lb

## 2014-02-10 DIAGNOSIS — I10 Essential (primary) hypertension: Secondary | ICD-10-CM

## 2014-02-10 DIAGNOSIS — I4891 Unspecified atrial fibrillation: Secondary | ICD-10-CM

## 2014-02-10 DIAGNOSIS — E785 Hyperlipidemia, unspecified: Secondary | ICD-10-CM

## 2014-02-10 DIAGNOSIS — Z950 Presence of cardiac pacemaker: Secondary | ICD-10-CM

## 2014-02-10 DIAGNOSIS — I480 Paroxysmal atrial fibrillation: Secondary | ICD-10-CM

## 2014-02-10 NOTE — Assessment & Plan Note (Signed)
On statin therapy with recent lipid profile performed 11/11/13 revealing a total cholesterol of 155, LDL of 82 and HDL of 51

## 2014-02-10 NOTE — Progress Notes (Signed)
02/10/2014 [name]   01-14-1933  [id number]  Primary Physician Joseph Post, MD Primary Cardiologist: Joseph Harp MD [name]   HPI:  [name] is a 80 year old moderately overweight married African American male with history of hypertension, hyperlipidemia, paroxysmal A. Fib/ sinus syndrome status French permanent transvenous pacemaker insertion by Dr. Sallyanne French 04/14/13. He has no other prior cardiac history. He denies chest pain or shortness of breath. Recent lipid profile performed 11/11/13 revealed a total cholesterol of 155 and LDL 82 HDL of 51. He denies chest pain or shortness of breath.   Current Outpatient Prescriptions  Medication Sig Dispense Refill  . amLODipine (NORVASC) 5 MG tablet take 1 tablet by mouth once daily  90 tablet  3  . losartan (COZAAR) 50 MG tablet Take 1 tablet (50 mg total) by mouth daily.  90 tablet  3  . metoprolol tartrate (LOPRESSOR) 25 MG tablet Take 1 tablet (25 mg total) by mouth 2 (two) times daily.  60 tablet  1  . Multiple Vitamin (MULTIVITAMIN WITH MINERALS) TABS tablet Take 1 tablet by mouth daily.      . Rivaroxaban (XARELTO) 15 MG TABS tablet Take 1 tablet (15 mg total) by mouth daily with supper.  90 tablet  3  . simvastatin (ZOCOR) 40 MG tablet Take 1 tablet (40 mg total) by mouth daily.  90 tablet  3   No current facility-administered medications for this visit.    No Known Allergies  History   Social History  . Marital Status: Married    Spouse Name: [name]    Number of Children: N/A  . Years of Education: N/A   Occupational History  . Retired   . RETIRED    Social History Main Topics  . Smoking status: Current Some Day Smoker -- 0.50 packs/day for 30 years    Types: Cigarettes  . Smokeless tobacco: Never Used     Comment:  pt stated he smokes 1 pack every 2 wks  . Alcohol Use: No  . Drug Use: No  . Sexual Activity: Not on file     Comment: started smoking 2-3 cig daily    Other Topics Concern  . Not on file   Social History Narrative  . No narrative on file     Review of Systems: General: negative for chills, fever, night sweats or weight changes.  Cardiovascular: negative for chest pain, dyspnea on exertion, edema, orthopnea, palpitations, paroxysmal nocturnal dyspnea or shortness of breath Dermatological: negative for rash Respiratory: negative for cough or wheezing Urologic: negative for hematuria Abdominal: negative for nausea, vomiting, diarrhea, bright red blood per rectum, melena, or hematemesis Neurologic: negative for visual changes, syncope, or dizziness All other systems reviewed and are otherwise negative except as noted above.    Blood pressure 120/88, pulse 69, height 5\' 11"  (1.803 m), weight 221 lb 9.6 oz (100.517 kg).  General appearance: alert and no distress Neck: no adenopathy, no carotid bruit, no JVD, supple, symmetrical, trachea midline and thyroid not enlarged, symmetric, no tenderness/mass/nodules Lungs: clear to auscultation bilaterally Heart: regular rate and rhythm, S1, S2 normal, no murmur, click, rub or gallop Extremities: extremities normal, atraumatic, no cyanosis or edema  EKG not performed today  ASSESSMENT AND PLAN:   HYPERLIPIDEMIA On statin therapy with recent lipid profile performed 11/11/13 revealing a total cholesterol of 155, LDL of 82 and HDL of 51  HYPERTENSION Controlled on current medications  Paroxysmal atrial fibrillation Maintaining sinus rhythm on beta blocker  and Xarelto.  Pacemaker - Medtronic, implanted 04/14/13 Followed by Dr. Sallyanne French      Joseph Harp MD [facility], [facility] 02/10/2014 10:12 AM

## 2014-02-10 NOTE — Assessment & Plan Note (Signed)
Followed by Dr. Croitoru 

## 2014-02-10 NOTE — Patient Instructions (Signed)
Your physician wants you to follow-up in: 1 year with Dr Berry. You will receive a reminder letter in the mail two months in advance. If you don't receive a letter, please call our office to schedule the follow-up appointment.  

## 2014-02-10 NOTE — Assessment & Plan Note (Signed)
Controlled on current medications 

## 2014-02-10 NOTE — Assessment & Plan Note (Signed)
Maintaining sinus rhythm on beta blocker and Xarelto.

## 2014-03-11 ENCOUNTER — Telehealth: Payer: Self-pay | Admitting: Family Medicine

## 2014-03-11 NOTE — Telephone Encounter (Signed)
Referral is still valid no other referral is needed will change name on the referral   Authorization # [id number] silverback -11/30/2013 - 06/02/2014 PT SCHEDULED FOR 11-30-2013 SUBMIT REFERRAL  Dr. Abel Presto, MD Op[facility]mology [facility] [place], [place] [address] [phone]

## 2014-03-11 NOTE — Telephone Encounter (Signed)
Pt needs referral to [facility] [address], [place], [place] [address] [phone] Pt has appt 10/30 at 8:30 am Previous referral was done for Dr. Abel Presto, MD and good until 06/02/14 Pt seeing another dr. Is that ok? Pt thinks its Dr Cori Razor.

## 2014-03-25 ENCOUNTER — Ambulatory Visit (INDEPENDENT_AMBULATORY_CARE_PROVIDER_SITE_OTHER): Payer: Commercial Managed Care - HMO | Admitting: *Deleted

## 2014-03-25 DIAGNOSIS — I495 Sick sinus syndrome: Secondary | ICD-10-CM

## 2014-03-26 ENCOUNTER — Telehealth: Payer: Self-pay | Admitting: Cardiology

## 2014-03-26 ENCOUNTER — Encounter: Payer: Self-pay | Admitting: Cardiovascular Disease

## 2014-03-26 DIAGNOSIS — I495 Sick sinus syndrome: Secondary | ICD-10-CM

## 2014-03-26 LAB — MDC_IDC_ENUM_SESS_TYPE_REMOTE
Battery Remaining Longevity: 107 mo
Battery Voltage: 3.02 V
Brady Statistic AP VS Percent: 87.38 %
Brady Statistic AS VP Percent: 0.02 %
Brady Statistic RV Percent Paced: 11.88 %
Lead Channel Impedance Value: 361 Ohm
Lead Channel Impedance Value: 494 Ohm
Lead Channel Impedance Value: 532 Ohm
Lead Channel Pacing Threshold Amplitude: 1.125 V
Lead Channel Pacing Threshold Pulse Width: 0.4 ms
Lead Channel Pacing Threshold Pulse Width: 0.4 ms
Lead Channel Sensing Intrinsic Amplitude: 1.5 mV
Lead Channel Setting Pacing Amplitude: 2 V
Lead Channel Setting Pacing Amplitude: 2.5 V
Lead Channel Setting Pacing Pulse Width: 0.4 ms
Lead Channel Setting Sensing Sensitivity: 0.9 mV
MDC IDC MSMT LEADCHNL RA PACING THRESHOLD AMPLITUDE: 0.75 V
MDC IDC MSMT LEADCHNL RV IMPEDANCE VALUE: 418 Ohm
MDC IDC MSMT LEADCHNL RV SENSING INTR AMPL: 15.375 mV
MDC IDC SESS DTM: 20151113182500
MDC IDC SET ZONE DETECTION INTERVAL: 400 ms
MDC IDC STAT BRADY AP VP PERCENT: 11.86 %
MDC IDC STAT BRADY AS VS PERCENT: 0.74 %
MDC IDC STAT BRADY RA PERCENT PACED: 99.24 %
Zone Setting Detection Interval: 400 ms

## 2014-03-26 NOTE — Progress Notes (Signed)
Remote pacemaker transmission.   

## 2014-03-26 NOTE — Telephone Encounter (Signed)
Confirmed remote transmission with pt caregiver.

## 2014-04-06 ENCOUNTER — Encounter: Payer: Self-pay | Admitting: Cardiology

## 2014-04-20 ENCOUNTER — Encounter: Payer: Self-pay | Admitting: Cardiology

## 2014-04-22 ENCOUNTER — Encounter (HOSPITAL_COMMUNITY): Payer: Self-pay | Admitting: Cardiovascular Disease

## 2014-04-27 ENCOUNTER — Ambulatory Visit: Payer: Medicare HMO | Admitting: Cardiovascular Disease

## 2014-05-10 ENCOUNTER — Ambulatory Visit: Payer: Commercial Managed Care - HMO | Admitting: Family Medicine

## 2014-05-10 ENCOUNTER — Ambulatory Visit (INDEPENDENT_AMBULATORY_CARE_PROVIDER_SITE_OTHER): Payer: Commercial Managed Care - HMO

## 2014-05-10 DIAGNOSIS — Z23 Encounter for immunization: Secondary | ICD-10-CM

## 2014-05-12 ENCOUNTER — Ambulatory Visit: Payer: Medicare HMO | Admitting: Family Medicine

## 2014-06-30 ENCOUNTER — Ambulatory Visit (INDEPENDENT_AMBULATORY_CARE_PROVIDER_SITE_OTHER): Payer: Commercial Managed Care - HMO | Admitting: *Deleted

## 2014-06-30 ENCOUNTER — Encounter: Payer: Self-pay | Admitting: Cardiovascular Disease

## 2014-06-30 DIAGNOSIS — C61 Malignant neoplasm of prostate: Secondary | ICD-10-CM | POA: Diagnosis not present

## 2014-06-30 DIAGNOSIS — I495 Sick sinus syndrome: Secondary | ICD-10-CM

## 2014-06-30 LAB — MDC_IDC_ENUM_SESS_TYPE_REMOTE
Battery Remaining Longevity: 100 mo
Battery Voltage: 3.02 V
Brady Statistic AP VS Percent: 86.6 %
Brady Statistic AS VS Percent: 1.06 %
Brady Statistic RV Percent Paced: 12.35 %
Date Time Interrogation Session: 20160217154541
Lead Channel Impedance Value: 361 Ohm
Lead Channel Impedance Value: 418 Ohm
Lead Channel Impedance Value: 494 Ohm
Lead Channel Pacing Threshold Amplitude: 1 V
Lead Channel Sensing Intrinsic Amplitude: 1.375 mV
Lead Channel Sensing Intrinsic Amplitude: 16.375 mV
Lead Channel Setting Pacing Amplitude: 2 V
Lead Channel Setting Pacing Amplitude: 2.5 V
Lead Channel Setting Sensing Sensitivity: 0.9 mV
MDC IDC MSMT LEADCHNL RA IMPEDANCE VALUE: 532 Ohm
MDC IDC MSMT LEADCHNL RA PACING THRESHOLD AMPLITUDE: 0.75 V
MDC IDC MSMT LEADCHNL RA PACING THRESHOLD PULSEWIDTH: 0.4 ms
MDC IDC MSMT LEADCHNL RA SENSING INTR AMPL: 1.375 mV
MDC IDC MSMT LEADCHNL RV PACING THRESHOLD PULSEWIDTH: 0.4 ms
MDC IDC MSMT LEADCHNL RV SENSING INTR AMPL: 16.375 mV
MDC IDC SET LEADCHNL RV PACING PULSEWIDTH: 0.4 ms
MDC IDC STAT BRADY AP VP PERCENT: 12.3 %
MDC IDC STAT BRADY AS VP PERCENT: 0.04 %
MDC IDC STAT BRADY RA PERCENT PACED: 98.9 %
Zone Setting Detection Interval: 400 ms
Zone Setting Detection Interval: 400 ms

## 2014-06-30 NOTE — Progress Notes (Signed)
Remote pacemaker transmission.   

## 2014-07-13 ENCOUNTER — Encounter: Payer: Self-pay | Admitting: Cardiology

## 2014-07-19 DIAGNOSIS — C61 Malignant neoplasm of prostate: Secondary | ICD-10-CM | POA: Diagnosis not present

## 2014-07-29 ENCOUNTER — Encounter: Payer: Self-pay | Admitting: Cardiology

## 2014-08-10 DIAGNOSIS — H35419 Lattice degeneration of retina, unspecified eye: Secondary | ICD-10-CM | POA: Diagnosis not present

## 2014-08-10 DIAGNOSIS — H524 Presbyopia: Secondary | ICD-10-CM | POA: Diagnosis not present

## 2014-08-10 DIAGNOSIS — H2513 Age-related nuclear cataract, bilateral: Secondary | ICD-10-CM | POA: Diagnosis not present

## 2014-08-10 DIAGNOSIS — H40013 Open angle with borderline findings, low risk, bilateral: Secondary | ICD-10-CM | POA: Diagnosis not present

## 2014-09-29 ENCOUNTER — Encounter: Payer: Commercial Managed Care - HMO | Admitting: *Deleted

## 2014-09-29 ENCOUNTER — Telehealth: Payer: Self-pay | Admitting: Cardiology

## 2014-09-29 NOTE — Telephone Encounter (Signed)
LMOVM reminding pt to send remote transmission.   

## 2014-10-01 ENCOUNTER — Encounter: Payer: Self-pay | Admitting: Cardiology

## 2014-10-14 ENCOUNTER — Ambulatory Visit (INDEPENDENT_AMBULATORY_CARE_PROVIDER_SITE_OTHER): Payer: Commercial Managed Care - HMO | Admitting: *Deleted

## 2014-10-14 DIAGNOSIS — I495 Sick sinus syndrome: Secondary | ICD-10-CM | POA: Diagnosis not present

## 2014-10-15 NOTE — Progress Notes (Signed)
Remote pacemaker transmission.   

## 2014-10-19 LAB — CUP PACEART REMOTE DEVICE CHECK
Battery Remaining Longevity: 95 mo
Brady Statistic AP VP Percent: 20.6 %
Brady Statistic AP VS Percent: 77.6 %
Brady Statistic AS VP Percent: 0.1 %
Brady Statistic AS VS Percent: 1.7 %
Brady Statistic RV Percent Paced: 20.7 %
Date Time Interrogation Session: 20160602144404
Lead Channel Impedance Value: 361 Ohm
Lead Channel Impedance Value: 418 Ohm
Lead Channel Impedance Value: 494 Ohm
Lead Channel Pacing Threshold Amplitude: 0.625 V
Lead Channel Pacing Threshold Pulse Width: 0.4 ms
Lead Channel Pacing Threshold Pulse Width: 0.4 ms
Lead Channel Sensing Intrinsic Amplitude: 16.875 mV
Lead Channel Sensing Intrinsic Amplitude: 16.875 mV
Lead Channel Sensing Intrinsic Amplitude: 2.75 mV
Lead Channel Sensing Intrinsic Amplitude: 2.75 mV
Lead Channel Setting Pacing Amplitude: 2 V
Lead Channel Setting Pacing Pulse Width: 0.4 ms
MDC IDC MSMT BATTERY VOLTAGE: 3.01 V
MDC IDC MSMT LEADCHNL RA IMPEDANCE VALUE: 513 Ohm
MDC IDC MSMT LEADCHNL RV PACING THRESHOLD AMPLITUDE: 1.125 V
MDC IDC SET LEADCHNL RV PACING AMPLITUDE: 2.5 V
MDC IDC SET LEADCHNL RV SENSING SENSITIVITY: 0.9 mV
MDC IDC STAT BRADY RA PERCENT PACED: 98.2 %
Zone Setting Detection Interval: 400 ms
Zone Setting Detection Interval: 400 ms

## 2014-10-27 DIAGNOSIS — M25561 Pain in right knee: Secondary | ICD-10-CM | POA: Diagnosis not present

## 2014-10-27 DIAGNOSIS — S8991XA Unspecified injury of right lower leg, initial encounter: Secondary | ICD-10-CM | POA: Diagnosis not present

## 2014-10-27 DIAGNOSIS — S199XXA Unspecified injury of neck, initial encounter: Secondary | ICD-10-CM | POA: Diagnosis not present

## 2014-10-27 DIAGNOSIS — R52 Pain, unspecified: Secondary | ICD-10-CM | POA: Diagnosis not present

## 2014-10-27 DIAGNOSIS — S335XXA Sprain of ligaments of lumbar spine, initial encounter: Secondary | ICD-10-CM | POA: Diagnosis not present

## 2014-10-27 DIAGNOSIS — I10 Essential (primary) hypertension: Secondary | ICD-10-CM | POA: Diagnosis not present

## 2014-10-27 DIAGNOSIS — T1490 Injury, unspecified: Secondary | ICD-10-CM | POA: Diagnosis not present

## 2014-10-27 DIAGNOSIS — S8391XA Sprain of unspecified site of right knee, initial encounter: Secondary | ICD-10-CM | POA: Diagnosis not present

## 2014-10-27 DIAGNOSIS — R079 Chest pain, unspecified: Secondary | ICD-10-CM | POA: Diagnosis not present

## 2014-10-27 DIAGNOSIS — M25551 Pain in right hip: Secondary | ICD-10-CM | POA: Diagnosis not present

## 2014-10-27 DIAGNOSIS — F17200 Nicotine dependence, unspecified, uncomplicated: Secondary | ICD-10-CM | POA: Diagnosis not present

## 2014-10-27 DIAGNOSIS — S233XXA Sprain of ligaments of thoracic spine, initial encounter: Secondary | ICD-10-CM | POA: Diagnosis not present

## 2014-10-27 DIAGNOSIS — S3992XA Unspecified injury of lower back, initial encounter: Secondary | ICD-10-CM | POA: Diagnosis not present

## 2014-10-27 DIAGNOSIS — M549 Dorsalgia, unspecified: Secondary | ICD-10-CM | POA: Diagnosis not present

## 2014-10-27 DIAGNOSIS — S299XXA Unspecified injury of thorax, initial encounter: Secondary | ICD-10-CM | POA: Diagnosis not present

## 2014-10-27 DIAGNOSIS — S139XXA Sprain of joints and ligaments of unspecified parts of neck, initial encounter: Secondary | ICD-10-CM | POA: Diagnosis not present

## 2014-10-27 DIAGNOSIS — M542 Cervicalgia: Secondary | ICD-10-CM | POA: Diagnosis not present

## 2014-11-02 ENCOUNTER — Encounter: Payer: Self-pay | Admitting: Cardiology

## 2014-11-03 ENCOUNTER — Encounter: Payer: Self-pay | Admitting: Cardiovascular Disease

## 2014-11-16 ENCOUNTER — Encounter: Payer: Self-pay | Admitting: Cardiology

## 2014-12-31 ENCOUNTER — Other Ambulatory Visit: Payer: Self-pay | Admitting: Family Medicine

## 2015-01-12 ENCOUNTER — Encounter: Payer: Self-pay | Admitting: *Deleted

## 2015-01-18 ENCOUNTER — Telehealth: Payer: Self-pay | Admitting: Cardiology

## 2015-01-18 ENCOUNTER — Ambulatory Visit (INDEPENDENT_AMBULATORY_CARE_PROVIDER_SITE_OTHER): Payer: Commercial Managed Care - HMO | Admitting: *Deleted

## 2015-01-18 DIAGNOSIS — I495 Sick sinus syndrome: Secondary | ICD-10-CM | POA: Diagnosis not present

## 2015-01-18 NOTE — Telephone Encounter (Signed)
Confirmed remote transmission w/ pt wife.   

## 2015-01-19 NOTE — Progress Notes (Signed)
Remote pacemaker transmission.   

## 2015-01-28 LAB — CUP PACEART REMOTE DEVICE CHECK
Battery Voltage: 3.01 V
Brady Statistic AP VP Percent: 21.6 %
Brady Statistic AP VS Percent: 74.7 %
Brady Statistic AS VP Percent: 0.1 %
Lead Channel Impedance Value: 494 Ohm
Lead Channel Pacing Threshold Amplitude: 0.625 V
Lead Channel Sensing Intrinsic Amplitude: 16.1 mV
Lead Channel Sensing Intrinsic Amplitude: 3.4 mV
Lead Channel Setting Pacing Amplitude: 2 V
Lead Channel Setting Pacing Amplitude: 2.5 V
Lead Channel Setting Pacing Pulse Width: 0.4 ms
MDC IDC MSMT LEADCHNL RA PACING THRESHOLD PULSEWIDTH: 0.4 ms
MDC IDC MSMT LEADCHNL RV IMPEDANCE VALUE: 513 Ohm
MDC IDC MSMT LEADCHNL RV PACING THRESHOLD AMPLITUDE: 0.875 V
MDC IDC MSMT LEADCHNL RV PACING THRESHOLD PULSEWIDTH: 0.4 ms
MDC IDC SESS DTM: 20160916141958
MDC IDC SET LEADCHNL RV SENSING SENSITIVITY: 0.9 mV
MDC IDC SET ZONE DETECTION INTERVAL: 400 ms
MDC IDC STAT BRADY AS VS PERCENT: 3.5 %
Zone Setting Detection Interval: 400 ms

## 2015-02-09 ENCOUNTER — Encounter: Payer: Self-pay | Admitting: *Deleted

## 2015-02-16 ENCOUNTER — Encounter: Payer: Self-pay | Admitting: Cardiology

## 2015-03-01 ENCOUNTER — Encounter: Payer: Self-pay | Admitting: Cardiovascular Disease

## 2015-03-02 ENCOUNTER — Encounter: Payer: Self-pay | Admitting: Cardiology

## 2015-03-07 ENCOUNTER — Other Ambulatory Visit: Payer: Self-pay | Admitting: Urology

## 2015-03-07 DIAGNOSIS — C61 Malignant neoplasm of prostate: Secondary | ICD-10-CM

## 2015-03-07 DIAGNOSIS — N183 Chronic kidney disease, stage 3 (moderate): Secondary | ICD-10-CM | POA: Diagnosis not present

## 2015-03-15 ENCOUNTER — Encounter: Payer: Self-pay | Admitting: *Deleted

## 2015-03-23 ENCOUNTER — Encounter (HOSPITAL_COMMUNITY)
Admission: RE | Admit: 2015-03-23 | Discharge: 2015-03-23 | Disposition: A | Discharge status: Home / Self Care | Payer: Commercial Managed Care - HMO | Source: Ambulatory Visit | Attending: Urology | Admitting: Urology

## 2015-03-23 DIAGNOSIS — R972 Elevated prostate specific antigen [PSA]: Secondary | ICD-10-CM | POA: Diagnosis not present

## 2015-03-23 DIAGNOSIS — R3129 Other microscopic hematuria: Secondary | ICD-10-CM | POA: Diagnosis not present

## 2015-03-23 DIAGNOSIS — C61 Malignant neoplasm of prostate: Secondary | ICD-10-CM | POA: Insufficient documentation

## 2015-03-23 MED ORDER — TECHNETIUM TC 99M MEDRONATE IV KIT
25.0000 | PACK | Freq: Once | INTRAVENOUS | Status: AC | PRN
  Administered 2015-03-23: 26 via INTRAVENOUS

## 2015-03-31 ENCOUNTER — Other Ambulatory Visit: Payer: Self-pay | Admitting: Family Medicine

## 2015-04-26 ENCOUNTER — Encounter: Payer: Self-pay | Admitting: Family Medicine

## 2015-04-26 ENCOUNTER — Ambulatory Visit (INDEPENDENT_AMBULATORY_CARE_PROVIDER_SITE_OTHER): Payer: Commercial Managed Care - HMO | Admitting: Family Medicine

## 2015-04-26 ENCOUNTER — Other Ambulatory Visit: Payer: Self-pay | Admitting: Family Medicine

## 2015-04-26 VITALS — BP 128/78 | HR 96 | Temp 99.3°F | Resp 16 | Ht 71.0 in | Wt 219.0 lb

## 2015-04-26 DIAGNOSIS — R05 Cough: Secondary | ICD-10-CM | POA: Diagnosis not present

## 2015-04-26 DIAGNOSIS — R059 Cough, unspecified: Secondary | ICD-10-CM

## 2015-04-26 MED ORDER — CEFUROXIME AXETIL 250 MG PO TABS: 250.0000 mg | ORAL_TABLET | Freq: Two times a day (BID) | ORAL | Status: DC

## 2015-04-26 MED ORDER — METOPROLOL TARTRATE 25 MG PO TABS: 25.0000 mg | ORAL_TABLET | Freq: Two times a day (BID) | ORAL | Status: DC

## 2015-04-26 NOTE — Patient Instructions (Signed)
Stay well hydrated [name] out your antibiotic Follow up for any fever or increased shortness of breath.

## 2015-04-26 NOTE — Progress Notes (Signed)
Subjective:    Patient ID: [name], male    DOB: 1933/04/06, 81 y.o.   MRN: [id number]  HPI Acute visit for one week history of mostly dry cough-but frequently productive early in the morning Long-term smoker. Smoked about 60 years currently half pack a day Denies any hemoptysis. No dyspnea. No known fever. Has some mild congestion. No myalgias. Has taken over-the-counter Coricidin without much relief. Cough is not waking him up much at night.  Chronic prompt include hypertension, hyperlipidemia, history of metastatic prostate cancer, osteoarthritis, obstructive sleep apnea, renal insufficiency. Poor compliance with follow-up. He states he is taking all his prescribed medications regularly. Has not had regular medical follow-up in quite some time  Past Medical History  Diagnosis Date  . Unspecified essential hypertension   . Other specified cardiac dysrhythmias(427.89)   . Disturbances of sulphur-bearing amino-acid metabolism   . Other and unspecified hyperlipidemia   . Malignant neoplasm of prostate ([place])   . Unspecified disorder resulting from impaired renal function   . Osteoarthrosis, unspecified whether generalized or localized, unspecified site   . Unspecified cerebral artery occlusion with cerebral infarction   . Obstructive sleep apnea (adult) (pediatric) 2004    surgery to coorrect  . Wears glasses   . Wears partial dentures     upper partial  . Pacemaker 04/14/2013    DUAL CHAMBER PACEMAKER  . Paroxysmal atrial fibrillation [facility])    Past Surgical History  Procedure Laterality Date  . Ent surgery  2004    correct snoring  . Orchiectomy  2002  . Cholecystectomy  1987  . Inguinal hernia repair      left  . Colonoscopy    . Minor irrigation and debridement of wound Right 03/04/2013    Procedure: MINOR IRRIGATION AND DEBRIDEMENT OF WOUND;  Surgeon: Theodoro Kos, DO;  Location: [place];  Service: Plastics;  Laterality: Right;  . Insert  / replace / remove pacemaker  04/14/2013    dual chamber   / Dr Sallyanne Kuster  . Skin split graft Right 08/12/2013    Procedure: SKIN GRAFT SPLIT THICKNESS WITH PLACEMENT OF VAC TO RIGHT LOWER LEG/PLACEMENT OF ACELL TO RIGHT UPPER THIGH AREA (HARVEST SITE);  Surgeon: Theodoro Kos, DO;  Location: [place];  Service: Plastics;  Laterality: Right;  . Application of a-cell of extremity Right 08/12/2013    Procedure: APPLICATION OF A-CELL OF EXTREMITY;  Surgeon: Theodoro Kos, DO;  Location: [place];  Service: Plastics;  Laterality: Right;  . Permanent pacemaker insertion N/A 04/14/2013    Procedure: PERMANENT PACEMAKER INSERTION;  Surgeon: [name], MD;  Location: [place] CATH [facility];  Service: Cardiovascular;  Laterality: N/A;    reports that he has been smoking Cigarettes.  He has a 15 pack-year smoking history. He has never used smokeless tobacco. He reports that he does not drink alcohol or use illicit drugs. family history includes Prostate cancer in his father. No Known Allergies    Review of Systems  Constitutional: Negative for fever, appetite change, fatigue and unexpected weight change.  HENT: Positive for congestion.   Eyes: Negative for visual disturbance.  Respiratory: Positive for cough. Negative for chest tightness, shortness of breath and wheezing.   Cardiovascular: Negative for chest pain, palpitations and leg swelling.  Neurological: Negative for dizziness, syncope, weakness, light-headedness and headaches.       Objective:   Physical Exam  Constitutional: He appears well-developed and well-nourished.  HENT:  Right Ear: External ear normal.  Left Ear: External ear normal.  Mouth/Throat: Oropharynx is clear and moist.  Neck: Neck supple.  Cardiovascular: Normal rate and regular rhythm.   Pulmonary/Chest: Effort normal and breath sounds normal. No respiratory distress. He has no wheezes. He has no rales.  Lymphadenopathy:    He has no cervical  adenopathy.          Assessment & Plan:  Cough. This may all represent viral process but given his age and long-term smoking history with productive cough we'll start Ceftin 250 mg twice a day for 7 days. Routine follow-up 3 weeks and go over all his medical problems at that time and needs follow-up labs at that visit

## 2015-04-26 NOTE — Progress Notes (Signed)
Pre visit review using our clinic review tool, if applicable. No additional management support is needed unless otherwise documented below in the visit note. 

## 2015-04-28 ENCOUNTER — Other Ambulatory Visit: Payer: Self-pay | Admitting: *Deleted

## 2015-04-28 MED ORDER — ALBUTEROL SULFATE 108 (90 BASE) MCG/ACT IN AEPB: 1.0000 | INHALATION_SPRAY | RESPIRATORY_TRACT | Status: DC

## 2015-04-28 NOTE — Telephone Encounter (Signed)
I left a detailed message at the pts home number the sample and a coupon was left at the front desk for the pt to pick up.

## 2015-05-03 ENCOUNTER — Other Ambulatory Visit: Payer: Self-pay | Admitting: Urology

## 2015-05-03 ENCOUNTER — Ambulatory Visit (HOSPITAL_COMMUNITY)
Admission: RE | Admit: 2015-05-03 | Discharge: 2015-05-03 | Disposition: A | Discharge status: Home / Self Care | Payer: Commercial Managed Care - HMO | Source: Ambulatory Visit | Attending: Urology | Admitting: Urology

## 2015-05-03 DIAGNOSIS — C61 Malignant neoplasm of prostate: Secondary | ICD-10-CM | POA: Insufficient documentation

## 2015-05-10 ENCOUNTER — Encounter: Payer: Self-pay | Admitting: *Deleted

## 2015-05-17 ENCOUNTER — Encounter: Payer: Self-pay | Admitting: Family Medicine

## 2015-05-17 ENCOUNTER — Ambulatory Visit (INDEPENDENT_AMBULATORY_CARE_PROVIDER_SITE_OTHER): Payer: Commercial Managed Care - HMO | Admitting: Family Medicine

## 2015-05-17 VITALS — BP 130/90 | HR 82 | Temp 97.9°F | Wt 219.0 lb

## 2015-05-17 DIAGNOSIS — C61 Malignant neoplasm of prostate: Secondary | ICD-10-CM

## 2015-05-17 DIAGNOSIS — Z23 Encounter for immunization: Secondary | ICD-10-CM

## 2015-05-17 DIAGNOSIS — E785 Hyperlipidemia, unspecified: Secondary | ICD-10-CM

## 2015-05-17 DIAGNOSIS — E669 Obesity, unspecified: Secondary | ICD-10-CM | POA: Diagnosis not present

## 2015-05-17 DIAGNOSIS — I10 Essential (primary) hypertension: Secondary | ICD-10-CM

## 2015-05-17 LAB — BASIC METABOLIC PANEL
BUN: 19 mg/dL (ref 6–23)
CO2: 26 mEq/L (ref 19–32)
CREATININE: 1.79 mg/dL — AB (ref 0.40–1.50)
Calcium: 10.2 mg/dL (ref 8.4–10.5)
Chloride: 111 mEq/L (ref 96–112)
GFR: 47.21 mL/min — AB (ref >60.00)
GLUCOSE: 112 mg/dL — AB (ref 70–99)
POTASSIUM: 4.2 meq/L (ref 3.5–5.1)
Sodium: 143 mEq/L (ref 135–145)

## 2015-05-17 LAB — LIPID PANEL
Cholesterol: 183 mg/dL (ref 0–200)
HDL: 43 mg/dL (ref >39.00)
LDL Cholesterol: 107 mg/dL — ABNORMAL HIGH (ref 0–99)
NONHDL: 140.25
Total CHOL/HDL Ratio: 4
Triglycerides: 164 mg/dL — ABNORMAL HIGH (ref 0.0–149.0)
VLDL: 32.8 mg/dL (ref 0.0–40.0)

## 2015-05-17 LAB — HEPATIC FUNCTION PANEL
ALT: 18 U/L (ref 0–53)
AST: 17 U/L (ref 0–37)
Albumin: 3.8 g/dL (ref 3.5–5.2)
Alkaline Phosphatase: 102 U/L (ref 39–117)
BILIRUBIN TOTAL: 0.3 mg/dL (ref 0.2–1.2)
Bilirubin, Direct: 0.1 mg/dL (ref 0.0–0.3)
Total Protein: 7 g/dL (ref 6.0–8.3)

## 2015-05-17 NOTE — Progress Notes (Signed)
Subjective:    Patient ID: [name], male    DOB: [birth year], 82 y.o.   MRN: [id number]  HPI Medical follow-up  Recent bronchial illness. Treated with Ceftin and patient is asymptomatic at this time. No recurrent cough. 60-pack-year history of smoking. Still smokes about one pack cigarettes per day. Denies any dyspnea. No recent fevers or chills Low motivation to quit smoking Still needs flu vaccine  Hypertension treated with losartan, amlodipine, and metoprolol. No recent dizziness. No chest pains. Compliant with therapy.  Hyperlipidemia. On simvastatin. Denies any claudication symptoms.  History of prostate cancer followed by urology. Recent bone scan with concern for right frontal uptake but plain films of the skull revealed no abnormalities. He denies any headache or skull pain  Past Medical History  Diagnosis Date  . Unspecified essential hypertension   . Other specified cardiac dysrhythmias(427.89)   . Disturbances of sulphur-bearing amino-acid metabolism   . Other and unspecified hyperlipidemia   . Malignant neoplasm of prostate ([place])   . Unspecified disorder resulting from impaired renal function   . Osteoarthrosis, unspecified whether generalized or localized, unspecified site   . Unspecified cerebral artery occlusion with cerebral infarction   . Obstructive sleep apnea (adult) (pediatric) 2004    surgery to coorrect  . Wears glasses   . Wears partial dentures     upper partial  . Pacemaker 04/14/2013    DUAL CHAMBER PACEMAKER  . Paroxysmal atrial fibrillation [facility])    Past Surgical History  Procedure Laterality Date  . Ent surgery  2004    correct snoring  . Orchiectomy  2002  . Cholecystectomy  1987  . Inguinal hernia repair      left  . Colonoscopy    . Minor irrigation and debridement of wound Right 03/04/2013    Procedure: MINOR IRRIGATION AND DEBRIDEMENT OF WOUND;  Surgeon: Theodoro Kos, DO;  Location: [place];  Service:  Plastics;  Laterality: Right;  . Insert / replace / remove pacemaker  04/14/2013    dual chamber   / Dr Sallyanne Kuster  . Skin split graft Right 08/12/2013    Procedure: SKIN GRAFT SPLIT THICKNESS WITH PLACEMENT OF VAC TO RIGHT LOWER LEG/PLACEMENT OF ACELL TO RIGHT UPPER THIGH AREA (HARVEST SITE);  Surgeon: Theodoro Kos, DO;  Location: [place];  Service: Plastics;  Laterality: Right;  . Application of a-cell of extremity Right 08/12/2013    Procedure: APPLICATION OF A-CELL OF EXTREMITY;  Surgeon: Theodoro Kos, DO;  Location: [place];  Service: Plastics;  Laterality: Right;  . Permanent pacemaker insertion N/A 04/14/2013    Procedure: PERMANENT PACEMAKER INSERTION;  Surgeon: [name], MD;  Location: [place] CATH [facility];  Service: Cardiovascular;  Laterality: N/A;    reports that he has been smoking Cigarettes.  He has a 15 pack-year smoking history. He has never used smokeless tobacco. He reports that he does not drink alcohol or use illicit drugs. family history includes Prostate cancer in his father. No Known Allergies    Review of Systems  Constitutional: Negative for fatigue and unexpected weight change.  Eyes: Negative for visual disturbance.  Respiratory: Negative for cough, chest tightness and shortness of breath.   Cardiovascular: Negative for chest pain, palpitations and leg swelling.  Gastrointestinal: Negative for abdominal pain.  Endocrine: Negative for polydipsia and polyuria.  Genitourinary: Negative for dysuria.  Neurological: Negative for dizziness, syncope, weakness, light-headedness and headaches.       Objective:   Physical Exam  Constitutional:  He appears well-developed and well-nourished.  HENT:  Mouth/Throat: Oropharynx is clear and moist.  Neck: Neck supple. No thyromegaly present.  Cardiovascular: Normal rate and regular rhythm.   Pulmonary/Chest: Effort normal and breath sounds normal. No respiratory distress. He has no wheezes. He  has no rales.  Musculoskeletal: He exhibits no edema.  Neurological: He is alert.          Assessment & Plan:  #1 hypertension. Stable. Continue current medications. Check basic metabolic panel #2 dyslipidemia. Check lipid and hepatic panel. Continue simvastatin #3 ongoing nicotine use. He is advised quit smoking but currently motivation is low. Flu vaccine given today. #4 history of prostate cancer. Continue follow-up with urology

## 2015-05-17 NOTE — Progress Notes (Signed)
Pre visit review using our clinic review tool, if applicable. No additional management support is needed unless otherwise documented below in the visit note. 

## 2015-06-08 ENCOUNTER — Other Ambulatory Visit: Payer: Self-pay | Admitting: Family Medicine

## 2015-06-15 ENCOUNTER — Encounter: Payer: Self-pay | Admitting: *Deleted

## 2015-07-13 ENCOUNTER — Encounter: Payer: Self-pay | Admitting: *Deleted

## 2015-10-13 ENCOUNTER — Other Ambulatory Visit: Payer: Self-pay | Admitting: Family Medicine

## 2015-11-16 ENCOUNTER — Encounter: Payer: Self-pay | Admitting: Family Medicine

## 2015-11-16 ENCOUNTER — Ambulatory Visit (INDEPENDENT_AMBULATORY_CARE_PROVIDER_SITE_OTHER): Payer: Commercial Managed Care - HMO | Admitting: Family Medicine

## 2015-11-16 VITALS — BP 120/68 | HR 80 | Temp 97.6°F | Ht 71.0 in | Wt 220.0 lb

## 2015-11-16 DIAGNOSIS — C61 Malignant neoplasm of prostate: Secondary | ICD-10-CM

## 2015-11-16 DIAGNOSIS — E785 Hyperlipidemia, unspecified: Secondary | ICD-10-CM

## 2015-11-16 DIAGNOSIS — N183 Chronic kidney disease, stage 3 unspecified: Secondary | ICD-10-CM

## 2015-11-16 DIAGNOSIS — I10 Essential (primary) hypertension: Secondary | ICD-10-CM

## 2015-11-16 NOTE — Progress Notes (Signed)
Subjective:    Patient ID: [name], male    DOB: 09/14/1932, 82 y.o.   MRN: [id number]  HPI Patient seen for medical follow-up  History of obesity, hypertension, past history of stroke, history of atrial fibrillation, obstructive sleep apnea, degenerative arthritis, metastatic prostate cancer, chronic kidney disease, and ongoing nicotine use. Low motivation to quit smoking. Currently smokes about one pack cigarettes per day.  Denies any chest pains or dyspnea. Medications reviewed. Compliant with all. He has not taken medications yet today. Does not monitor blood pressure at home. No recent dizziness. No peripheral edema  Still plays golf about 2 times per week. Lipids were checked last visit and stable. Poor compliance with diet at times. He states he ate a lot of salt yesterday  Past Medical History  Diagnosis Date  . Unspecified essential hypertension   . Other specified cardiac dysrhythmias(427.89)   . Disturbances of sulphur-bearing amino-acid metabolism   . Other and unspecified hyperlipidemia   . Malignant neoplasm of prostate ([place])   . Unspecified disorder resulting from impaired renal function   . Osteoarthrosis, unspecified whether generalized or localized, unspecified site   . Unspecified cerebral artery occlusion with cerebral infarction   . Obstructive sleep apnea (adult) (pediatric) 2004    surgery to coorrect  . Wears glasses   . Wears partial dentures     upper partial  . Pacemaker 04/14/2013    DUAL CHAMBER PACEMAKER  . Paroxysmal atrial fibrillation [facility])    Past Surgical History  Procedure Laterality Date  . Ent surgery  2004    correct snoring  . Orchiectomy  2002  . Cholecystectomy  1987  . Inguinal hernia repair      left  . Colonoscopy    . Minor irrigation and debridement of wound Right 03/04/2013    Procedure: MINOR IRRIGATION AND DEBRIDEMENT OF WOUND;  Surgeon: Theodoro Kos, DO;  Location: [place];  Service:  Plastics;  Laterality: Right;  . Insert / replace / remove pacemaker  04/14/2013    dual chamber   / Dr Sallyanne Kuster  . Skin split graft Right 08/12/2013    Procedure: SKIN GRAFT SPLIT THICKNESS WITH PLACEMENT OF VAC TO RIGHT LOWER LEG/PLACEMENT OF ACELL TO RIGHT UPPER THIGH AREA (HARVEST SITE);  Surgeon: Theodoro Kos, DO;  Location: [place];  Service: Plastics;  Laterality: Right;  . Application of a-cell of extremity Right 08/12/2013    Procedure: APPLICATION OF A-CELL OF EXTREMITY;  Surgeon: Theodoro Kos, DO;  Location: [place];  Service: Plastics;  Laterality: Right;  . Permanent pacemaker insertion N/A 04/14/2013    Procedure: PERMANENT PACEMAKER INSERTION;  Surgeon: [name], MD;  Location: [place] CATH LAB;  Service: Cardiovascular;  Laterality: N/A;    reports that he has been smoking Cigarettes.  He has a 15 pack-year smoking history. He has never used smokeless tobacco. He reports that he does not drink alcohol or use illicit drugs. family history includes Prostate cancer in his father. No Known Allergies    Review of Systems  Constitutional: Negative for fatigue and unexpected weight change.  Eyes: Negative for visual disturbance.  Respiratory: Negative for cough, chest tightness and shortness of breath.   Cardiovascular: Negative for chest pain, palpitations and leg swelling.  Gastrointestinal: Negative for abdominal pain.  Endocrine: Negative for polydipsia and polyuria.  Neurological: Negative for dizziness, syncope, weakness, light-headedness and headaches.       Objective:   Physical Exam  Constitutional: He is  oriented to person, place, and time. He appears well-developed and well-nourished.  HENT:  Right Ear: External ear normal.  Left Ear: External ear normal.  Mouth/Throat: Oropharynx is clear and moist.  Eyes: Pupils are equal, round, and reactive to light.  Neck: Neck supple. No thyromegaly present.  Cardiovascular: Normal rate  and regular rhythm.   Pulmonary/Chest: Effort normal and breath sounds normal. No respiratory distress. He has no wheezes. He has no rales.  Musculoskeletal: He exhibits no edema.  Neurological: He is alert and oriented to person, place, and time.          Assessment & Plan:  #1 hypertension. Blood pressure stable. Continue current medications.  #2 chronic kidney disease. Recent GFR 47. Avoid nonsteroidals. Recheck basic metabolic panel at follow-up in 6 months  #3 dyslipidemia. We discussed dietary factors. Low saturated fat diet. Recheck basic metabolic panel follow-up  #4 ongoing nicotine use. Strongly advised to stop smoking. Low motivation to quit  #5 history of metastatic prostate cancer followed by urology. No recent new symptoms.  [name] MD [facility] at [facility]

## 2015-11-16 NOTE — Patient Instructions (Signed)
Watch your sodium (salt) intake Quit smoking- or at least try to cut back Let's plan on 6 months follow up.

## 2015-11-16 NOTE — Progress Notes (Signed)
Pre visit review using our clinic review tool, if applicable. No additional management support is needed unless otherwise documented below in the visit note. 

## 2015-12-15 ENCOUNTER — Telehealth: Payer: Self-pay | Admitting: *Deleted

## 2015-12-15 NOTE — Telephone Encounter (Signed)
Patient walked in to the office requesting a form be completed stating he is cleared to have a tooth extraction tomorrow.  Dr Maudie Mercury was asked to review this since Dr Elease Hashimoto is not here and she reviewed the chart and stated the pt should contact his heart doctor for clearance as he is taking Plavix.  I returned the form to the pt and informed him of this and he agreed to contact the cardiologist.

## 2015-12-16 ENCOUNTER — Telehealth: Payer: Self-pay | Admitting: Family Medicine

## 2015-12-16 ENCOUNTER — Other Ambulatory Visit: Payer: Self-pay

## 2015-12-16 DIAGNOSIS — H35363 Drusen (degenerative) of macula, bilateral: Secondary | ICD-10-CM | POA: Diagnosis not present

## 2015-12-16 DIAGNOSIS — H2513 Age-related nuclear cataract, bilateral: Secondary | ICD-10-CM | POA: Diagnosis not present

## 2015-12-16 DIAGNOSIS — Z01810 Encounter for preprocedural cardiovascular examination: Secondary | ICD-10-CM

## 2015-12-16 DIAGNOSIS — H40013 Open angle with borderline findings, low risk, bilateral: Secondary | ICD-10-CM

## 2015-12-16 DIAGNOSIS — H25013 Cortical age-related cataract, bilateral: Secondary | ICD-10-CM | POA: Diagnosis not present

## 2015-12-16 LAB — HM DIABETES EYE EXAM

## 2015-12-16 NOTE — Telephone Encounter (Signed)
Patient needs a referral for his appointment with Mihai Croitoru at [facility].  His appointment is this coming Monday, 12/19/15 at 10:15 am.  Please give patient a call about this referral.

## 2015-12-16 NOTE — Telephone Encounter (Signed)
Referral entered. Pt is aware.  

## 2015-12-19 ENCOUNTER — Encounter: Payer: Self-pay | Admitting: Family Medicine

## 2015-12-19 ENCOUNTER — Ambulatory Visit (INDEPENDENT_AMBULATORY_CARE_PROVIDER_SITE_OTHER): Payer: Commercial Managed Care - HMO | Admitting: Cardiovascular Disease

## 2015-12-19 VITALS — BP 133/88 | HR 67 | Ht 71.0 in | Wt 217.2 lb

## 2015-12-19 DIAGNOSIS — E785 Hyperlipidemia, unspecified: Secondary | ICD-10-CM | POA: Diagnosis not present

## 2015-12-19 DIAGNOSIS — I480 Paroxysmal atrial fibrillation: Secondary | ICD-10-CM

## 2015-12-19 DIAGNOSIS — Z950 Presence of cardiac pacemaker: Secondary | ICD-10-CM

## 2015-12-19 DIAGNOSIS — I495 Sick sinus syndrome: Secondary | ICD-10-CM | POA: Diagnosis not present

## 2015-12-19 DIAGNOSIS — F17200 Nicotine dependence, unspecified, uncomplicated: Secondary | ICD-10-CM

## 2015-12-19 DIAGNOSIS — Z79899 Other long term (current) drug therapy: Secondary | ICD-10-CM

## 2015-12-19 NOTE — Patient Instructions (Addendum)
Dr Sallyanne Kuster recommends that you continue on your current medications as directed. Please refer to the Current Medication list given to you today.  Your physician recommends that you return for lab work at your earliest [place].  Remote monitoring is used to monitor your Pacemaker of ICD from home. This monitoring reduces the number of office visits required to check your device to one time per year. It allows [place] to keep an eye on the functioning of your device to ensure it is working properly. You are scheduled for a device check from home on Monday, November 6th, 2017. You may send your transmission at any time that day. If you have a wireless device, the transmission will be sent automatically. After your physician reviews your transmission, you will receive a postcard with your next transmission date.  Dr Sallyanne Kuster recommends that you schedule a follow-up appointment in 12 months with a pacemaker check. You will receive a reminder letter in the mail two months in advance. If you don't receive a letter, please call our office to schedule the follow-up appointment.  Your physician recommends that you schedule a follow-up appointment first available with Dr Gwenlyn Found.  If you need a refill on your cardiac medications before your next appointment, please call your pharmacy.

## 2015-12-19 NOTE — Progress Notes (Signed)
Cardiology Office Note    Date:  12/19/2015   ID:  [name], DOB 1932/11/05, MRN [id number]  PCP:  [name], MD  Cardiologist:  Quay Burow, M.D.;  [name], MD   Chief Complaint  Patient presents with  . Follow-up    patient reports no complaints. here for clearance for dental extractions with Dr Perlie Gold.    History of Present Illness:  [name] is a 82 y.o. male with sinus node dysfunction and paroxysmal atrial fibrillation here for follow-up on his dual-chamber permanent pacemaker. This is his first office appointment since August 2015 and his device has not been checked remotely since September 2016. Study lasted a download in December, but I find no record of this.  His device was implanted in 2014 (MRI conditional Medtronic advise a dual-chamber pacemaker) for tachycardia-bradycardia syndrome. Additional medical problems include hyperlipidemia, hypertension, obstructive sleep apnea, renal insufficiency, metastatic prostate cancer followed by Dr. Jeffie Pollock.  I last saw him he was on anticoagulation therapy with Xarelto. He is now taking clopidogrel. He is not sure, but believes that he was told to stop the anticoagulant by Dr. Jeffie Pollock. He does not recall having any bleeding complications. He also denies any neurological events or other embolic issues.  Interrogation of his pacemaker shows normal device function. Estimated generator longevity is about another 6 years. There is 98% atrial pacing and only 17% ventricular pacing. The burden of atrial rapid rates is roughly 1%, made up by a combination of atrial tachycardia with one-to-one conduction, atrial flutter with 2:1 conduction and some episodes of atrial fibrillation. During atrial fibrillation rate control is adequate with rates in the 70s to 100s. During atrial flutter the ventricular rate is in the 150s. All of the episodes of atrial arrhythmia are less than 8 minutes in duration with only 4  episodes lasting more than 5 minutes in duration.    Past Medical History:  Diagnosis Date  . Disturbances of sulphur-bearing amino-acid metabolism   . Malignant neoplasm of prostate ([place])   . Obstructive sleep apnea (adult) (pediatric) 2004   surgery to coorrect  . Osteoarthrosis, unspecified whether generalized or localized, unspecified site   . Other and unspecified hyperlipidemia   . Other specified cardiac dysrhythmias(427.89)   . 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 Paroxysmal atrial fibrillation (HCC)   . Unspecified cerebral artery occlusion with cerebral infarction   . Unspecified disorder resulting from impaired renal function   . Unspecified essential hypertension   . Wears glasses   . Wears partial dentures    upper partial    Past Surgical History:  Procedure Laterality Date  . APPLICATION OF A-CELL OF EXTREMITY Right 08/12/2013   Procedure: APPLICATION OF A-CELL OF EXTREMITY;  Surgeon: Theodoro Kos, DO;  Location: [place];  Service: Plastics;  Laterality: Right;  . CHOLECYSTECTOMY  1987  . COLONOSCOPY    . ent surgery  2004   correct snoring  . INGUINAL HERNIA REPAIR     left  . INSERT / REPLACE / REMOVE PACEMAKER  04/14/2013   dual chamber   / Dr Sallyanne Kuster  . MINOR IRRIGATION AND DEBRIDEMENT OF WOUND Right 03/04/2013   Procedure: MINOR IRRIGATION AND DEBRIDEMENT OF WOUND;  Surgeon: Theodoro Kos, DO;  Location: [place];  Service: Plastics;  Laterality: Right;  . ORCHIECTOMY  2002  . PERMANENT PACEMAKER INSERTION N/A 04/14/2013   Procedure: PERMANENT PACEMAKER INSERTION;  Surgeon: [name], MD;  Location: [facility]  CATH LAB;  Service: Cardiovascular;  Laterality: N/A;  . SKIN SPLIT GRAFT Right 08/12/2013   Procedure: SKIN GRAFT SPLIT THICKNESS WITH PLACEMENT OF VAC TO RIGHT LOWER LEG/PLACEMENT OF ACELL TO RIGHT UPPER THIGH AREA (HARVEST SITE);  Surgeon: Theodoro Kos, DO;  Location: [place];  Service:  Plastics;  Laterality: Right;    Current Medications: Outpatient Medications Prior to Visit  Medication Sig Dispense Refill  . Albuterol Sulfate (PROAIR RESPICLICK) 123XX123 (90 BASE) MCG/ACT AEPB Inhale 1 puff into the lungs as directed. Every 4-6 hours as needed 1 each 0  . amLODipine (NORVASC) 5 MG tablet TAKE 1 TABLET EVERY DAY 90 tablet 1  . clopidogrel (PLAVIX) 75 MG tablet TAKE 1 TABLET EVERY DAY WITH BREAKFAST 90 tablet 1  . losartan (COZAAR) 50 MG tablet TAKE 1 TABLET EVERY DAY 90 tablet 1  . metoprolol tartrate (LOPRESSOR) 25 MG tablet Take 1 tablet (25 mg total) by mouth 2 (two) times daily. 180 tablet 2  . Multiple Vitamin (MULTIVITAMIN WITH MINERALS) TABS tablet Take 1 tablet by mouth daily.    . simvastatin (ZOCOR) 40 MG tablet TAKE 1 TABLET EVERY DAY 90 tablet 1   No facility-administered medications prior to visit.      Allergies:   Review of patient's allergies indicates no known allergies.   Social History   Social History  . Marital status: Married    Spouse name: Consulting civil engineer  . Number of children: N/A  . Years of education: N/A   Occupational History  . Retired   . RETIRED Retired   Social History Main Topics  . Smoking status: Current Some Day Smoker    Packs/day: 0.50    Years: 30.00    Types: Cigarettes  . Smokeless tobacco: Never Used     Comment:  pt stated he smokes 1 pack every 2 wks  . Alcohol use No  . Drug use: No  . Sexual activity: Not on file     Comment: started smoking 2-3 cig daily   Other Topics Concern  . Not on file   Social History Narrative  . No narrative on file     Family History:  The patient's family history includes Prostate cancer in his father.   ROS:   Please see the history of present illness.    ROS All other systems reviewed and are negative.   PHYSICAL EXAM:   VS:  BP 133/88   Pulse 67   Ht 5\' 11"  (1.803 m)   Wt 217 lb 3.2 oz (98.5 kg)   BMI 30.29 kg/m    GEN: Well nourished, well developed, in no acute  distress  HEENT: normal  Neck: no JVD, carotid bruits, or masses Cardiac: RRR; no murmurs, rubs, or gallops,no edema ; the left subclavian pacemaker site Respiratory:  clear to auscultation bilaterally, normal work of breathing GI: soft, nontender, nondistended, + BS MS: no deformity or atrophy  Skin: warm and dry, no rash Neuro:  Alert and Oriented x 3, Strength and sensation are intact Psych: euthymic mood, full affect  Wt Readings from Last 3 Encounters:  12/19/15 217 lb 3.2 oz (98.5 kg)  11/16/15 220 lb (99.8 kg)  05/17/15 219 lb (99.3 kg)      Studies/Labs Reviewed:   EKG:  EKG is ordered today.  The ekg ordered today demonstrates Atrial paced, ventricular sensed rhythm with a long AV conduction time, inferolateral ST depression with T-wave inversion, unchanged from previous tracings  Recent Labs: 05/17/2015: ALT 18;  BUN 19; Creatinine, Ser 1.79; Potassium 4.2; Sodium 143   Lipid Panel    Component Value Date/Time   CHOL 183 05/17/2015 0856   TRIG 164.0 (H) 05/17/2015 0856   TRIG 77 04/11/2006 0906   HDL 43.00 05/17/2015 0856   CHOLHDL 4 05/17/2015 0856   VLDL 32.8 05/17/2015 0856   LDLCALC 107 (H) 05/17/2015 0856   LDLDIRECT 150.4 05/17/2011 1219     ASSESSMENT:    1. Paroxysmal atrial fibrillation (HCC)   2. Tachy-brady syndrome ([place])   3. Dyslipidemia   4. Pacemaker - Medtronic, implanted 04/14/13   5. CIGARETTE SMOKER   6. Medication management      PLAN:  In order of problems listed above:  1. AFib: Recently episodes of atrial flutter and atrial fibrillation have all been very brief, but in the past he did have one episode that lasted for about 3 hours. At a time when his son was critically ill). Due to his age and history of previous stroke, embolic risk is increased and he would meet criteria for full anticoagulation (CHADSVasc 4, CHADS 3). I'm not sure why anticoagulation was suspended and whether this was intended to be a permanent change. The fact that  he is now on clopidogrel is also a little surprising. We'll try to obtain more information before recommending that he restart anticoagulation. 2. SSS: Has nearly 100% atrial pacing due to underlying sinus bradycardia 3. HLP: I think that in the absence of known vascular disease, there is no compelling reason to adjust his statin. 4. PPM: Normal device function. Needs to continue remote downloads every 3 months and office visits at least yearly. We'll try to troubleshoot why we lost track of his pacemaker for the last 12 months or so. Advised him to call the office if he does not get a download appointment every 3 months. 5. Smoking cessation is strongly encouraged  Make sure he has the appropriate follow-up with Dr. Gwenlyn Found. By then we should have more data and make an appropriate decision regarding anticoagulation versus antiplatelet therapy.  Medication Adjustments/Labs and Tests Ordered: Current medicines are reviewed at length with the patient today.  Concerns regarding medicines are outlined above.  Medication changes, Labs and Tests ordered today are listed in the Patient Instructions below. Patient Instructions  Dr Sallyanne Kuster recommends that you continue on your current medications as directed. Please refer to the Current Medication list given to you today.  Your physician recommends that you return for lab work at your earliest [place].  Remote monitoring is used to monitor your Pacemaker of ICD from home. This monitoring reduces the number of office visits required to check your device to one time per year. It allows [place] to keep an eye on the functioning of your device to ensure it is working properly. You are scheduled for a device check from home on Monday, November 6th, 2017. You may send your transmission at any time that day. If you have a wireless device, the transmission will be sent automatically. After your physician reviews your transmission, you will receive a postcard  with your next transmission date.  Dr Sallyanne Kuster recommends that you schedule a follow-up appointment in 12 months with a pacemaker check. You will receive a reminder letter in the mail two months in advance. If you don't receive a letter, please call our office to schedule the follow-up appointment.  Your physician recommends that you schedule a follow-up appointment first available with Dr Gwenlyn Found.  If you need a  refill on your cardiac medications before your next appointment, please call your pharmacy.    Signed, [name], MD  12/19/2015 3:31 PM    [facility] [place], [place], [place]  [address] Phone: [phone]; Fax: [phone]

## 2015-12-22 LAB — CUP PACEART INCLINIC DEVICE CHECK
Battery Remaining Longevity: 78 mo
Brady Statistic AP VP Percent: 17.08 %
Brady Statistic AP VS Percent: 81.04 %
Brady Statistic AS VS Percent: 1.78 %
Brady Statistic RV Percent Paced: 17.18 %
Date Time Interrogation Session: 20170807143402
Implantable Lead Implant Date: 20141202
Implantable Lead Location: 753859
Implantable Lead Model: 5076
Lead Channel Impedance Value: 399 Ohm
Lead Channel Impedance Value: 494 Ohm
Lead Channel Pacing Threshold Amplitude: 1.375 V
Lead Channel Sensing Intrinsic Amplitude: 15.25 mV
Lead Channel Sensing Intrinsic Amplitude: 2.625 mV
Lead Channel Sensing Intrinsic Amplitude: 2.625 mV
Lead Channel Setting Pacing Amplitude: 2.75 V
MDC IDC LEAD IMPLANT DT: 20141202
MDC IDC LEAD LOCATION: 753860
MDC IDC MSMT BATTERY VOLTAGE: 3.01 V
MDC IDC MSMT LEADCHNL RA IMPEDANCE VALUE: 361 Ohm
MDC IDC MSMT LEADCHNL RA PACING THRESHOLD AMPLITUDE: 0.75 V
MDC IDC MSMT LEADCHNL RA PACING THRESHOLD PULSEWIDTH: 0.4 ms
MDC IDC MSMT LEADCHNL RV IMPEDANCE VALUE: 513 Ohm
MDC IDC MSMT LEADCHNL RV PACING THRESHOLD PULSEWIDTH: 0.4 ms
MDC IDC MSMT LEADCHNL RV SENSING INTR AMPL: 15.25 mV
MDC IDC SET LEADCHNL RA PACING AMPLITUDE: 2 V
MDC IDC SET LEADCHNL RV PACING PULSEWIDTH: 0.4 ms
MDC IDC SET LEADCHNL RV SENSING SENSITIVITY: 0.9 mV
MDC IDC STAT BRADY AS VP PERCENT: 0.1 %
MDC IDC STAT BRADY RA PERCENT PACED: 98.12 %

## 2015-12-23 ENCOUNTER — Telehealth: Payer: Self-pay | Admitting: Cardiovascular Disease

## 2015-12-23 NOTE — Telephone Encounter (Signed)
Faxed Release signed by patient to [facility] to obtain records per Dr Croitoru's request.  Faxed on 12/23/15.

## 2015-12-23 NOTE — Telephone Encounter (Signed)
12/23/2015 Received fax referral packet from [facility] for upcoming appointment with Dr. Gwenlyn Found on 01/24/2016.  Records given to [facility].  cbr

## 2015-12-26 ENCOUNTER — Encounter: Payer: Self-pay | Admitting: Cardiovascular Disease

## 2016-01-09 ENCOUNTER — Encounter: Payer: Self-pay | Admitting: Cardiovascular Disease

## 2016-01-09 ENCOUNTER — Telehealth: Payer: Self-pay | Admitting: Cardiovascular Disease

## 2016-01-09 NOTE — Telephone Encounter (Signed)
Closed Encounter  °

## 2016-01-24 ENCOUNTER — Ambulatory Visit: Payer: Commercial Managed Care - HMO | Admitting: Cardiovascular Disease

## 2016-02-01 ENCOUNTER — Ambulatory Visit (INDEPENDENT_AMBULATORY_CARE_PROVIDER_SITE_OTHER): Payer: Commercial Managed Care - HMO | Admitting: Cardiovascular Disease

## 2016-02-01 ENCOUNTER — Encounter: Payer: Self-pay | Admitting: Cardiovascular Disease

## 2016-02-01 DIAGNOSIS — I10 Essential (primary) hypertension: Secondary | ICD-10-CM | POA: Diagnosis not present

## 2016-02-01 DIAGNOSIS — I495 Sick sinus syndrome: Secondary | ICD-10-CM | POA: Diagnosis not present

## 2016-02-01 DIAGNOSIS — E785 Hyperlipidemia, unspecified: Secondary | ICD-10-CM | POA: Diagnosis not present

## 2016-02-01 NOTE — Patient Instructions (Signed)
Medication Instructions:  NO CHANGES.   Follow-Up: Your physician wants you to follow-up in: 12 MONTHS WITH DR BERRY.  You will receive a reminder letter in the mail two months in advance. If you don't receive a letter, please call our office to schedule the follow-up appointment.   If you need a refill on your cardiac medications before your next appointment, please call your pharmacy.   

## 2016-02-01 NOTE — Progress Notes (Signed)
02/01/2016 [name]   May 18, 1932  [id number]  Primary Physician [name], MD Primary Cardiologist: Lorretta Harp MD Renae Gloss  HPI:  Mr. [name] is a 82 year old moderately overweight married African American male with history of hypertension, hyperlipidemia, paroxysmal A. Fib/ sinus syndrome status post permanent transvenous pacemaker insertion by Dr. Sallyanne Kuster 04/14/13. I last saw him in the office 02/10/14. He has no other prior cardiac history. He denies chest pain or shortness of breath. Recent lipid profile performed 05/17/15 revealed a total cholesterol 183, LDL of 107 and HDL of 43.  He denies chest pain or shortness of breath. Since I saw him 2 years ago his Xarelto oral at regulation has been discontinued by his urologist, Dr. Jeffie Pollock, and Plavix started.   Current Outpatient Prescriptions  Medication Sig Dispense Refill  . Albuterol Sulfate (PROAIR RESPICLICK) 123XX123 (90 BASE) MCG/ACT AEPB Inhale 1 puff into the lungs as directed. Every 4-6 hours as needed 1 each 0  . amLODipine (NORVASC) 5 MG tablet TAKE 1 TABLET EVERY DAY 90 tablet 1  . clopidogrel (PLAVIX) 75 MG tablet TAKE 1 TABLET EVERY DAY WITH BREAKFAST 90 tablet 1  . losartan (COZAAR) 50 MG tablet TAKE 1 TABLET EVERY DAY 90 tablet 1  . metoprolol tartrate (LOPRESSOR) 25 MG tablet Take 1 tablet (25 mg total) by mouth 2 (two) times daily. 180 tablet 2  . Multiple Vitamin (MULTIVITAMIN WITH MINERALS) TABS tablet Take 1 tablet by mouth daily.    . simvastatin (ZOCOR) 40 MG tablet TAKE 1 TABLET EVERY DAY 90 tablet 1   No current facility-administered medications for this visit.     No Known Allergies  Social History   Social History  . Marital status: Married    Spouse name: Consulting civil engineer  . Number of children: N/A  . Years of education: N/A   Occupational History  . Retired   . RETIRED Retired   Social History Main Topics  . Smoking status: Current Some Day Smoker    Packs/day:  0.50    Years: 30.00    Types: Cigarettes  . Smokeless tobacco: Never Used     Comment:  pt stated he smokes 1 pack every 2 wks  . Alcohol use No  . Drug use: No  . Sexual activity: Not on file     Comment: started smoking 2-3 cig daily   Other Topics Concern  . Not on file   Social History Narrative  . No narrative on file     Review of Systems: General: negative for chills, fever, night sweats or weight changes.  Cardiovascular: negative for chest pain, dyspnea on exertion, edema, orthopnea, palpitations, paroxysmal nocturnal dyspnea or shortness of breath Dermatological: negative for rash Respiratory: negative for cough or wheezing Urologic: negative for hematuria Abdominal: negative for nausea, vomiting, diarrhea, bright red blood per rectum, melena, or hematemesis Neurologic: negative for visual changes, syncope, or dizziness All other systems reviewed and are otherwise negative except as noted above.    Blood pressure 122/86, pulse 82, height 5\' 11"  (1.803 m), weight 215 lb 6.4 oz (97.7 kg), SpO2 96 %.  General appearance: alert and no distress Neck: no adenopathy, no carotid bruit, no JVD, supple, symmetrical, trachea midline and thyroid not enlarged, symmetric, no tenderness/mass/nodules Lungs: clear to auscultation bilaterally Heart: regular rate and rhythm, S1, S2 normal, no murmur, click, rub or gallop Extremities: extremities normal, atraumatic, no cyanosis or edema  EKG not performed today  ASSESSMENT AND PLAN:  Hyperlipidemia History of hyperlipidemia on statin therapy with recent lipid profile performed 05/17/15 revealed total cholesterol 193, LDL 107 and HDL 43.  Essential hypertension History of hypertension blood pressure measured 122/86. He is on amlodipine and losartan as well as metoprolol. Continue current meds at current dosing  Tachy-brady syndrome (HCC) History of chronic A. fib/tachybradycardia syndrome status post dual-chamber permanent  transvenous pacemaker insertion implanted./2/14 followed by Dr. Sallyanne Kuster. He was on Xarelto oral antibiotic regulation at the time of his last visit 2 years ago. Apparently this was discontinued by his urologist, Dr. Jeffie Pollock , and Plavix was started for unclear reasons.      Lorretta Harp MD FACP,FACC,FAHA, [facility] 02/01/2016 10:07 AM

## 2016-02-01 NOTE — Assessment & Plan Note (Signed)
History of hypertension blood pressure measured 122/86. He is on amlodipine and losartan as well as metoprolol. Continue current meds at current dosing

## 2016-02-01 NOTE — Assessment & Plan Note (Signed)
History of hyperlipidemia on statin therapy with recent lipid profile performed 05/17/15 revealed total cholesterol 193, LDL 107 and HDL 43.

## 2016-02-01 NOTE — Assessment & Plan Note (Signed)
History of chronic A. fib/tachybradycardia syndrome status post dual-chamber permanent transvenous pacemaker insertion implanted./2/14 followed by Dr. Sallyanne Kuster. He was on Xarelto oral antibiotic regulation at the time of his last visit 2 years ago. Apparently this was discontinued by his urologist, Dr. Jeffie Pollock , and Plavix was started for unclear reasons.

## 2016-02-29 ENCOUNTER — Other Ambulatory Visit: Payer: Self-pay | Admitting: Pharmacist

## 2016-02-29 NOTE — Patient Outreach (Signed)
Outreach call to [name] regarding his request for follow up from the [facility] Medication Adherence Campaign. Left a HIPAA compliant message on the patient's voicemail.   Harlow Asa, PharmD Clinical Pharmacist [facility] [phone]

## 2016-03-19 ENCOUNTER — Telehealth: Payer: Self-pay | Admitting: Cardiology

## 2016-03-19 ENCOUNTER — Ambulatory Visit (INDEPENDENT_AMBULATORY_CARE_PROVIDER_SITE_OTHER): Payer: Commercial Managed Care - HMO | Admitting: *Deleted

## 2016-03-19 DIAGNOSIS — I495 Sick sinus syndrome: Secondary | ICD-10-CM | POA: Diagnosis not present

## 2016-03-19 NOTE — Telephone Encounter (Signed)
Spoke with pt and reminded pt of remote transmission that is due today. Pt verbalized understanding.   

## 2016-03-20 NOTE — Progress Notes (Signed)
Remote pacemaker transmission.   

## 2016-03-21 ENCOUNTER — Encounter: Payer: Self-pay | Admitting: Cardiology

## 2016-03-28 ENCOUNTER — Ambulatory Visit (INDEPENDENT_AMBULATORY_CARE_PROVIDER_SITE_OTHER): Payer: Commercial Managed Care - HMO

## 2016-03-28 DIAGNOSIS — Z23 Encounter for immunization: Secondary | ICD-10-CM | POA: Diagnosis not present

## 2016-03-28 LAB — CUP PACEART REMOTE DEVICE CHECK
Battery Remaining Longevity: 76 mo
Brady Statistic AP VS Percent: 78.42 %
Brady Statistic AS VS Percent: 1.29 %
Date Time Interrogation Session: 20171106201851
Implantable Lead Implant Date: 20141202
Implantable Lead Location: 753859
Implantable Lead Location: 753860
Lead Channel Pacing Threshold Amplitude: 1.5 V
Lead Channel Pacing Threshold Pulse Width: 0.4 ms
Lead Channel Sensing Intrinsic Amplitude: 14.875 mV
Lead Channel Sensing Intrinsic Amplitude: 2.625 mV
Lead Channel Sensing Intrinsic Amplitude: 2.625 mV
Lead Channel Setting Pacing Amplitude: 3 V
MDC IDC LEAD IMPLANT DT: 20141202
MDC IDC MSMT BATTERY VOLTAGE: 3.01 V
MDC IDC MSMT LEADCHNL RA IMPEDANCE VALUE: 361 Ohm
MDC IDC MSMT LEADCHNL RA IMPEDANCE VALUE: 456 Ohm
MDC IDC MSMT LEADCHNL RA PACING THRESHOLD AMPLITUDE: 0.75 V
MDC IDC MSMT LEADCHNL RA PACING THRESHOLD PULSEWIDTH: 0.4 ms
MDC IDC MSMT LEADCHNL RV IMPEDANCE VALUE: 399 Ohm
MDC IDC MSMT LEADCHNL RV IMPEDANCE VALUE: 532 Ohm
MDC IDC MSMT LEADCHNL RV SENSING INTR AMPL: 14.875 mV
MDC IDC PG IMPLANT DT: 20141202
MDC IDC SET LEADCHNL RA PACING AMPLITUDE: 2 V
MDC IDC SET LEADCHNL RV PACING PULSEWIDTH: 0.4 ms
MDC IDC SET LEADCHNL RV SENSING SENSITIVITY: 0.9 mV
MDC IDC STAT BRADY AP VP PERCENT: 20.17 %
MDC IDC STAT BRADY AS VP PERCENT: 0.12 %
MDC IDC STAT BRADY RA PERCENT PACED: 98.59 %
MDC IDC STAT BRADY RV PERCENT PACED: 20.3 %

## 2016-03-29 ENCOUNTER — Ambulatory Visit: Payer: Commercial Managed Care - HMO

## 2016-04-04 ENCOUNTER — Encounter: Payer: Self-pay | Admitting: Cardiology

## 2016-04-04 NOTE — Progress Notes (Signed)
Letter  

## 2016-04-18 ENCOUNTER — Other Ambulatory Visit: Payer: Self-pay | Admitting: Family Medicine

## 2016-05-18 ENCOUNTER — Encounter: Payer: Self-pay | Admitting: Family Medicine

## 2016-05-18 ENCOUNTER — Ambulatory Visit (INDEPENDENT_AMBULATORY_CARE_PROVIDER_SITE_OTHER): Payer: Medicare PPO | Admitting: Family Medicine

## 2016-05-18 VITALS — BP 115/80 | HR 86 | Temp 97.6°F | Ht 71.0 in | Wt 221.8 lb

## 2016-05-18 DIAGNOSIS — I10 Essential (primary) hypertension: Secondary | ICD-10-CM

## 2016-05-18 DIAGNOSIS — Z23 Encounter for immunization: Secondary | ICD-10-CM | POA: Diagnosis not present

## 2016-05-18 DIAGNOSIS — E785 Hyperlipidemia, unspecified: Secondary | ICD-10-CM | POA: Diagnosis not present

## 2016-05-18 DIAGNOSIS — N183 Chronic kidney disease, stage 3 unspecified: Secondary | ICD-10-CM

## 2016-05-18 LAB — CBC WITH DIFFERENTIAL/PLATELET
BASOS ABS: 0 10*3/uL (ref 0.0–0.1)
Basophils Relative: 0.4 % (ref 0.0–3.0)
Eosinophils Absolute: 0.3 10*3/uL (ref 0.0–0.7)
Eosinophils Relative: 2.9 % (ref 0.0–5.0)
HCT: 40.9 % (ref 39.0–52.0)
Hemoglobin: 13.6 g/dL (ref 13.0–17.0)
LYMPHS ABS: 2.9 10*3/uL (ref 0.7–4.0)
Lymphocytes Relative: 30.2 % (ref 12.0–46.0)
MCHC: 33.3 g/dL (ref 30.0–36.0)
MCV: 88.5 fl (ref 78.0–100.0)
MONO ABS: 0.7 10*3/uL (ref 0.1–1.0)
MONOS PCT: 6.8 % (ref 3.0–12.0)
NEUTROS PCT: 59.7 % (ref 43.0–77.0)
Neutro Abs: 5.8 10*3/uL (ref 1.4–7.7)
Platelets: 194 10*3/uL (ref 150.0–400.0)
RBC: 4.62 Mil/uL (ref 4.22–5.81)
RDW: 16.2 % — ABNORMAL HIGH (ref 11.5–15.5)
WBC: 9.8 10*3/uL (ref 4.0–10.5)

## 2016-05-18 LAB — LIPID PANEL
CHOLESTEROL: 190 mg/dL (ref 0–200)
HDL: 51.1 mg/dL (ref >39.00)
LDL Cholesterol: 103 mg/dL — ABNORMAL HIGH (ref 0–99)
NONHDL: 139.07
Total CHOL/HDL Ratio: 4
Triglycerides: 182 mg/dL — ABNORMAL HIGH (ref 0.0–149.0)
VLDL: 36.4 mg/dL (ref 0.0–40.0)

## 2016-05-18 LAB — COMPREHENSIVE METABOLIC PANEL
ALBUMIN: 3.9 g/dL (ref 3.5–5.2)
ALK PHOS: 105 U/L (ref 39–117)
ALT: 43 U/L (ref 0–53)
AST: 27 U/L (ref 0–37)
BILIRUBIN TOTAL: 0.3 mg/dL (ref 0.2–1.2)
BUN: 24 mg/dL — AB (ref 6–23)
CO2: 28 mEq/L (ref 19–32)
Calcium: 10.2 mg/dL (ref 8.4–10.5)
Chloride: 110 mEq/L (ref 96–112)
Creatinine, Ser: 2.09 mg/dL — ABNORMAL HIGH (ref 0.40–1.50)
GFR: 39.38 mL/min — AB (ref >60.00)
GLUCOSE: 104 mg/dL — AB (ref 70–99)
Potassium: 4.8 mEq/L (ref 3.5–5.1)
SODIUM: 144 meq/L (ref 135–145)
TOTAL PROTEIN: 6.7 g/dL (ref 6.0–8.3)

## 2016-05-18 NOTE — Progress Notes (Signed)
Pre visit review using our clinic review tool, if applicable. No additional management support is needed unless otherwise documented below in the visit note. 

## 2016-05-18 NOTE — Patient Instructions (Signed)
Try to quit smoking altogether We gave you pneumovax today Continue with yearly flu vaccine.

## 2016-05-18 NOTE — Progress Notes (Signed)
Subjective:     Patient ID: [name], male   DOB: 1933/01/24, 83 y.o.   MRN: [id number]  HPI Patient seen for routine medical follow-up. He has history of obesity, hyperlipidemia, hypertension, chronic kidney disease, prostate cancer, past history of stroke, ongoing nicotine use. He has history of obstructive sleep apnea but does not use CPAP. Generally feels well.  Denies any recent chest pain. No claudication symptoms. No focal weakness. Medications reviewed and compliant with all.  Past Medical History:  Diagnosis Date  . Disturbances of sulphur-bearing amino-acid metabolism   . Malignant neoplasm of prostate ([place])   . Obstructive sleep apnea (adult) (pediatric) 2004   surgery to coorrect  . Osteoarthrosis, unspecified whether generalized or localized, unspecified site   . Other and unspecified hyperlipidemia   . Other specified cardiac dysrhythmias(427.89)   . 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 Paroxysmal atrial fibrillation (HCC)   . Unspecified cerebral artery occlusion with cerebral infarction   . Unspecified disorder resulting from impaired renal function   . Unspecified essential hypertension   . Wears glasses   . Wears partial dentures    upper partial   Past Surgical History:  Procedure Laterality Date  . APPLICATION OF A-CELL OF EXTREMITY Right 08/12/2013   Procedure: APPLICATION OF A-CELL OF EXTREMITY;  Surgeon: Theodoro Kos, DO;  Location: [facility];  Service: Plastics;  Laterality: Right;  . CHOLECYSTECTOMY  1987  . COLONOSCOPY    . ent surgery  2004   correct snoring  . INGUINAL HERNIA REPAIR     left  . INSERT / REPLACE / REMOVE PACEMAKER  04/14/2013   dual chamber   / Dr Sallyanne Kuster  . MINOR IRRIGATION AND DEBRIDEMENT OF WOUND Right 03/04/2013   Procedure: MINOR IRRIGATION AND DEBRIDEMENT OF WOUND;  Surgeon: Theodoro Kos, DO;  Location: [facility];  Service: Plastics;  Laterality: Right;  . ORCHIECTOMY  2002   . PERMANENT PACEMAKER INSERTION N/A 04/14/2013   Procedure: PERMANENT PACEMAKER INSERTION;  Surgeon: [name], MD;  Location: [facility];  Service: Cardiovascular;  Laterality: N/A;  . SKIN SPLIT GRAFT Right 08/12/2013   Procedure: SKIN GRAFT SPLIT THICKNESS WITH PLACEMENT OF VAC TO RIGHT LOWER LEG/PLACEMENT OF ACELL TO RIGHT UPPER THIGH AREA (HARVEST SITE);  Surgeon: Theodoro Kos, DO;  Location: [facility];  Service: Plastics;  Laterality: Right;    reports that he has been smoking Cigarettes.  He has a 15.00 pack-year smoking history. He has never used smokeless tobacco. He reports that he does not drink alcohol or use drugs. family history includes Prostate cancer in his father. No Known Allergies   Review of Systems  Constitutional: Negative for fatigue.  Eyes: Negative for visual disturbance.  Respiratory: Negative for cough, chest tightness and shortness of breath.   Cardiovascular: Negative for chest pain, palpitations and leg swelling.  Neurological: Negative for dizziness, syncope, weakness, light-headedness and headaches.       Objective:   Physical Exam  Constitutional: He is oriented to person, place, and time. He appears well-developed and well-nourished.  HENT:  Right Ear: External ear normal.  Left Ear: External ear normal.  Mouth/Throat: Oropharynx is clear and moist.  Eyes: Pupils are equal, round, and reactive to light.  Neck: Neck supple. No thyromegaly present.  Cardiovascular: Normal rate and regular rhythm.   Pulmonary/Chest: Effort normal and breath sounds normal. No respiratory distress. He has no wheezes. He has no rales.  Musculoskeletal: He exhibits no  edema.  Neurological: He is alert and oriented to person, place, and time.       Assessment:     #1 hypertension stable by repeat reading  #2 hyperlipidemia  #3 chronic kidney disease  #4 ongoing nicotine use    Plan:     -Check labs with CBC, comprehensive metabolic panel,  lipid panel -Pneumovax given. Patient has already received flu vaccine for this year -Strongly encouraged to stop smoking altogether -Continue current medications. Routine follow up 6 months  [name] MD [facility] at [facility]

## 2016-05-20 ENCOUNTER — Encounter: Payer: Self-pay | Admitting: Family Medicine

## 2016-06-13 ENCOUNTER — Ambulatory Visit (INDEPENDENT_AMBULATORY_CARE_PROVIDER_SITE_OTHER): Payer: Medicare PPO | Admitting: Family Medicine

## 2016-06-13 ENCOUNTER — Encounter: Payer: Self-pay | Admitting: Family Medicine

## 2016-06-13 VITALS — BP 120/90 | HR 81 | Temp 97.4°F | Ht 71.0 in | Wt 219.0 lb

## 2016-06-13 DIAGNOSIS — R05 Cough: Secondary | ICD-10-CM

## 2016-06-13 DIAGNOSIS — R059 Cough, unspecified: Secondary | ICD-10-CM

## 2016-06-13 MED ORDER — AZITHROMYCIN 250 MG PO TABS: ORAL_TABLET | ORAL | 0 refills | Status: AC

## 2016-06-13 MED ORDER — PREDNISONE 10 MG PO TABS: ORAL_TABLET | ORAL | 0 refills | Status: DC

## 2016-06-13 MED ORDER — ALBUTEROL SULFATE 108 (90 BASE) MCG/ACT IN AEPB: 1.0000 | INHALATION_SPRAY | RESPIRATORY_TRACT | 2 refills | Status: DC

## 2016-06-13 NOTE — Progress Notes (Signed)
Pre visit review using our clinic review tool, if applicable. No additional management support is needed unless otherwise documented below in the visit note. 

## 2016-06-13 NOTE — Patient Instructions (Signed)
Follow up for any fever or increasing shortness of breath Touch base if cough not better in 2 weeks.

## 2016-06-13 NOTE — Progress Notes (Signed)
Subjective:     Patient ID: [name], male   DOB: [birth year], 83 y.o.   MRN: [id number]  HPI Patient seen with 2 week history of intermittent productive cough. He's had some subjective wheezing. No fever. No hemoptysis. Appetite is normal. Subjective wheezing intermittently. His had some nasal congestion. No sore throat. Has used albuterol inhaler in the past but needs refill. No chest pains.  Past Medical History:  Diagnosis Date  . Disturbances of sulphur-bearing amino-acid metabolism   . Malignant neoplasm of prostate ([place])   . Obstructive sleep apnea (adult) (pediatric) 2004   surgery to coorrect  . Osteoarthrosis, unspecified whether generalized or localized, unspecified site   . Other and unspecified hyperlipidemia   . Other specified cardiac dysrhythmias(427.89)   . 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 Paroxysmal atrial fibrillation (HCC)   . Unspecified cerebral artery occlusion with cerebral infarction   . Unspecified disorder resulting from impaired renal function   . Unspecified essential hypertension   . Wears glasses   . Wears partial dentures    upper partial   Past Surgical History:  Procedure Laterality Date  . APPLICATION OF A-CELL OF EXTREMITY Right 08/12/2013   Procedure: APPLICATION OF A-CELL OF EXTREMITY;  Surgeon: Theodoro Kos, DO;  Location: [facility];  Service: Plastics;  Laterality: Right;  . CHOLECYSTECTOMY  1987  . COLONOSCOPY    . ent surgery  2004   correct snoring  . INGUINAL HERNIA REPAIR     left  . INSERT / REPLACE / REMOVE PACEMAKER  04/14/2013   dual chamber   / Dr Sallyanne Kuster  . MINOR IRRIGATION AND DEBRIDEMENT OF WOUND Right 03/04/2013   Procedure: MINOR IRRIGATION AND DEBRIDEMENT OF WOUND;  Surgeon: Theodoro Kos, DO;  Location: [facility];  Service: Plastics;  Laterality: Right;  . ORCHIECTOMY  2002  . PERMANENT PACEMAKER INSERTION N/A 04/14/2013   Procedure: PERMANENT PACEMAKER INSERTION;   Surgeon: [name], MD;  Location: [facility] C[facility];  Service: Cardiovascular;  Laterality: N/A;  . SKIN SPLIT GRAFT Right 08/12/2013   Procedure: SKIN GRAFT SPLIT THICKNESS WITH PLACEMENT OF VAC TO RIGHT LOWER LEG/PLACEMENT OF ACELL TO RIGHT UPPER THIGH AREA (HARVEST SITE);  Surgeon: Theodoro Kos, DO;  Location: [facility];  Service: Plastics;  Laterality: Right;    reports that he has been smoking Cigarettes.  He has a 15.00 pack-year smoking history. He has never used smokeless tobacco. He reports that he does not drink alcohol or use drugs. family history includes Prostate cancer in his father. No Known Allergies   Review of Systems  Constitutional: Negative for chills and fever.  HENT: Positive for congestion. Negative for sinus pressure and sore throat.   Respiratory: Positive for cough. Negative for shortness of breath.   Cardiovascular: Negative for chest pain.       Objective:   Physical Exam  Constitutional: He appears well-developed and well-nourished.  HENT:  Right Ear: External ear normal.  Left Ear: External ear normal.  Mouth/Throat: Oropharynx is clear and moist.  Neck: Neck supple.  Cardiovascular: Normal rate and regular rhythm.   Pulmonary/Chest: Effort normal. No respiratory distress. He has no rales.  Musculoskeletal: He exhibits no edema.  Lymphadenopathy:    He has no cervical adenopathy.       Assessment:     Cough and mild wheezing/reactive airway component. Patient in no respiratory distress. Pulse oxygen 97%.    Plan:     -Prednisone taper over  the next week and reviewed potential side effects -Refill albuterol inhaler for as needed use -Start Zithromax for 5 days -Follow-up promptly for any fever or increasing shortness of breath  [name] MD [facility] at [facility]

## 2016-06-18 ENCOUNTER — Ambulatory Visit (INDEPENDENT_AMBULATORY_CARE_PROVIDER_SITE_OTHER): Payer: Medicare PPO | Admitting: *Deleted

## 2016-06-18 DIAGNOSIS — I495 Sick sinus syndrome: Secondary | ICD-10-CM | POA: Diagnosis not present

## 2016-06-19 LAB — CUP PACEART REMOTE DEVICE CHECK
Battery Voltage: 3.01 V
Brady Statistic AP VP Percent: 18.08 %
Brady Statistic AS VP Percent: 0.13 %
Brady Statistic RA Percent Paced: 98.1 %
Date Time Interrogation Session: 20180205164028
Implantable Lead Implant Date: 20141202
Implantable Lead Location: 753860
Implantable Lead Model: 5076
Implantable Pulse Generator Implant Date: 20141202
Lead Channel Impedance Value: 380 Ohm
Lead Channel Impedance Value: 456 Ohm
Lead Channel Pacing Threshold Amplitude: 0.625 V
Lead Channel Pacing Threshold Pulse Width: 0.4 ms
Lead Channel Sensing Intrinsic Amplitude: 14.75 mV
Lead Channel Setting Pacing Amplitude: 2 V
Lead Channel Setting Pacing Pulse Width: 0.4 ms
Lead Channel Setting Sensing Sensitivity: 0.9 mV
MDC IDC LEAD IMPLANT DT: 20141202
MDC IDC LEAD LOCATION: 753859
MDC IDC MSMT BATTERY REMAINING LONGEVITY: 74 mo
MDC IDC MSMT LEADCHNL RA IMPEDANCE VALUE: 342 Ohm
MDC IDC MSMT LEADCHNL RA SENSING INTR AMPL: 1.375 mV
MDC IDC MSMT LEADCHNL RA SENSING INTR AMPL: 1.375 mV
MDC IDC MSMT LEADCHNL RV IMPEDANCE VALUE: 513 Ohm
MDC IDC MSMT LEADCHNL RV PACING THRESHOLD AMPLITUDE: 1 V
MDC IDC MSMT LEADCHNL RV PACING THRESHOLD PULSEWIDTH: 0.4 ms
MDC IDC MSMT LEADCHNL RV SENSING INTR AMPL: 14.75 mV
MDC IDC SET LEADCHNL RV PACING AMPLITUDE: 2.5 V
MDC IDC STAT BRADY AP VS PERCENT: 80.46 %
MDC IDC STAT BRADY AS VS PERCENT: 1.33 %
MDC IDC STAT BRADY RV PERCENT PACED: 18.04 %

## 2016-06-19 NOTE — Progress Notes (Signed)
Remote pacemaker transmission.   

## 2016-06-20 ENCOUNTER — Encounter: Payer: Self-pay | Admitting: Cardiology

## 2016-06-27 ENCOUNTER — Encounter: Payer: Self-pay | Admitting: Family Medicine

## 2016-06-27 ENCOUNTER — Ambulatory Visit (INDEPENDENT_AMBULATORY_CARE_PROVIDER_SITE_OTHER): Payer: Medicare PPO | Admitting: Family Medicine

## 2016-06-27 VITALS — BP 120/82 | HR 89 | Temp 98.1°F | Ht 71.0 in | Wt 220.0 lb

## 2016-06-27 DIAGNOSIS — R05 Cough: Secondary | ICD-10-CM

## 2016-06-27 DIAGNOSIS — R059 Cough, unspecified: Secondary | ICD-10-CM

## 2016-06-27 DIAGNOSIS — R062 Wheezing: Secondary | ICD-10-CM | POA: Diagnosis not present

## 2016-06-27 MED ORDER — METHYLPREDNISOLONE ACETATE 80 MG/ML IJ SUSP
80.0000 mg | Freq: Once | INTRAMUSCULAR | Status: AC
  Administered 2016-06-27: 80 mg via INTRAMUSCULAR

## 2016-06-27 NOTE — Progress Notes (Signed)
Subjective:     Patient ID: [name], male   DOB: 1932/05/22, 83 y.o.   MRN: [id number]  HPI Patient seen with some persistent wheezing and cough. He was seen here on the 31st and we placed on Zithromax and prednisone. Does not feel any worse but no better. His cough is dry. No hemoptysis. Still has some wheezing. He is sleeping okay. No fevers or chills. He's used albuterol with minimal if any improvement. Weight is stable. No peripheral edema. Ongoing nicotine use. No diagnosis of COPD. Last chest x-ray 2014. He had echocardiogram 2014 with ejection fraction of 65%.  Appetite and weight are stable.  Past Medical History:  Diagnosis Date  . Disturbances of sulphur-bearing amino-acid metabolism   . Malignant neoplasm of prostate ([place])   . Obstructive sleep apnea (adult) (pediatric) 2004   surgery to coorrect  . Osteoarthrosis, unspecified whether generalized or localized, unspecified site   . Other and unspecified hyperlipidemia   . Other specified cardiac dysrhythmias(427.89)   . 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 Paroxysmal atrial fibrillation (HCC)   . Unspecified cerebral artery occlusion with cerebral infarction   . Unspecified disorder resulting from impaired renal function   . Unspecified essential hypertension   . Wears glasses   . Wears partial dentures    upper partial   Past Surgical History:  Procedure Laterality Date  . APPLICATION OF A-CELL OF EXTREMITY Right 08/12/2013   Procedure: APPLICATION OF A-CELL OF EXTREMITY;  Surgeon: Theodoro Kos, DO;  Location: [facility];  Service: Plastics;  Laterality: Right;  . CHOLECYSTECTOMY  1987  . COLONOSCOPY    . ent surgery  2004   correct snoring  . INGUINAL HERNIA REPAIR     left  . INSERT / REPLACE / REMOVE PACEMAKER  04/14/2013   dual chamber   / Dr Sallyanne Kuster  . MINOR IRRIGATION AND DEBRIDEMENT OF WOUND Right 03/04/2013   Procedure: MINOR IRRIGATION AND DEBRIDEMENT OF WOUND;  Surgeon:  Theodoro Kos, DO;  Location: [facility];  Service: Plastics;  Laterality: Right;  . ORCHIECTOMY  2002  . PERMANENT PACEMAKER INSERTION N/A 04/14/2013   Procedure: PERMANENT PACEMAKER INSERTION;  Surgeon: [name], MD;  Location: [facility] C[facility];  Service: Cardiovascular;  Laterality: N/A;  . SKIN SPLIT GRAFT Right 08/12/2013   Procedure: SKIN GRAFT SPLIT THICKNESS WITH PLACEMENT OF VAC TO RIGHT LOWER LEG/PLACEMENT OF ACELL TO RIGHT UPPER THIGH AREA (HARVEST SITE);  Surgeon: Theodoro Kos, DO;  Location: [facility];  Service: Plastics;  Laterality: Right;    reports that he has been smoking Cigarettes.  He has a 15.00 pack-year smoking history. He has never used smokeless tobacco. He reports that he does not drink alcohol or use drugs. family history includes Prostate cancer in his father. No Known Allergies   Review of Systems  Constitutional: Negative for appetite change, chills, fatigue, fever and unexpected weight change.  Eyes: Negative for visual disturbance.  Respiratory: Positive for cough and wheezing. Negative for chest tightness and shortness of breath.   Cardiovascular: Negative for chest pain, palpitations and leg swelling.  Neurological: Negative for dizziness, syncope, weakness, light-headedness and headaches.       Objective:   Physical Exam  Constitutional: He appears well-developed and well-nourished.  HENT:  Right Ear: External ear normal.  Left Ear: External ear normal.  Mouth/Throat: Oropharynx is clear and moist.  Neck: Neck supple.  Cardiovascular: Normal rate.   Pulmonary/Chest: Effort normal. No respiratory  distress. He has wheezes. He has no rales.  He has some scattered expiratory wheezes. Normal respiratory rate. No respiratory distress. Pulse oximetry 97%  Musculoskeletal: He exhibits no edema.       Assessment:     Persistent cough and wheezing in an 83 year old with over 40 year history of smoking. No dx of COPD. He is  in no respiratory distress.    Plan:     -Obtain chest x-ray to further evaluate -Depo-Medrol 80 mg IM given -Continue albuterol as needed -Follow-up immediately for any fever or increasing shortness of breath -We'll recommend follow-up with Dr. Lenna Gilford in the next couple of weeks if symptoms not improving  [name] MD [facility] at [facility]

## 2016-06-27 NOTE — Progress Notes (Signed)
Pre visit review using our clinic review tool, if applicable. No additional management support is needed unless otherwise documented below in the visit note. 

## 2016-06-27 NOTE — Patient Instructions (Signed)
Go for CXR at [facility] Follow up for any fever or increased shortness of breath Continue with Albuterol inhaler as needed.

## 2016-06-28 ENCOUNTER — Encounter: Payer: Self-pay | Admitting: Family Medicine

## 2016-06-28 ENCOUNTER — Ambulatory Visit (INDEPENDENT_AMBULATORY_CARE_PROVIDER_SITE_OTHER)
Admission: RE | Admit: 2016-06-28 | Discharge: 2016-06-28 | Disposition: A | Discharge status: Home / Self Care | Payer: Medicare PPO | Source: Ambulatory Visit | Attending: Family Medicine | Admitting: Family Medicine

## 2016-06-28 DIAGNOSIS — R062 Wheezing: Secondary | ICD-10-CM | POA: Diagnosis not present

## 2016-07-04 ENCOUNTER — Encounter: Payer: Self-pay | Admitting: Cardiology

## 2016-09-08 ENCOUNTER — Other Ambulatory Visit: Payer: Self-pay | Admitting: Family Medicine

## 2016-09-17 ENCOUNTER — Ambulatory Visit (INDEPENDENT_AMBULATORY_CARE_PROVIDER_SITE_OTHER): Payer: Medicare PPO | Admitting: *Deleted

## 2016-09-17 DIAGNOSIS — I495 Sick sinus syndrome: Secondary | ICD-10-CM | POA: Diagnosis not present

## 2016-09-18 LAB — CUP PACEART REMOTE DEVICE CHECK
Battery Voltage: 3 V
Brady Statistic AP VP Percent: 22.79 %
Brady Statistic AS VS Percent: 3.47 %
Brady Statistic RA Percent Paced: 94.48 %
Implantable Lead Implant Date: 20141202
Implantable Lead Implant Date: 20141202
Implantable Lead Location: 753860
Implantable Lead Model: 5076
Implantable Pulse Generator Implant Date: 20141202
Lead Channel Impedance Value: 342 Ohm
Lead Channel Impedance Value: 437 Ohm
Lead Channel Pacing Threshold Amplitude: 0.75 V
Lead Channel Pacing Threshold Amplitude: 1 V
Lead Channel Pacing Threshold Pulse Width: 0.4 ms
Lead Channel Pacing Threshold Pulse Width: 0.4 ms
Lead Channel Sensing Intrinsic Amplitude: 2.75 mV
Lead Channel Sensing Intrinsic Amplitude: 2.75 mV
Lead Channel Setting Pacing Pulse Width: 0.4 ms
MDC IDC LEAD LOCATION: 753859
MDC IDC MSMT BATTERY REMAINING LONGEVITY: 69 mo
MDC IDC MSMT LEADCHNL RV IMPEDANCE VALUE: 361 Ohm
MDC IDC MSMT LEADCHNL RV IMPEDANCE VALUE: 475 Ohm
MDC IDC MSMT LEADCHNL RV SENSING INTR AMPL: 15.875 mV
MDC IDC MSMT LEADCHNL RV SENSING INTR AMPL: 15.875 mV
MDC IDC SESS DTM: 20180507170526
MDC IDC SET LEADCHNL RA PACING AMPLITUDE: 2 V
MDC IDC SET LEADCHNL RV PACING AMPLITUDE: 2.75 V
MDC IDC SET LEADCHNL RV SENSING SENSITIVITY: 0.9 mV
MDC IDC STAT BRADY AP VS PERCENT: 73.3 %
MDC IDC STAT BRADY AS VP PERCENT: 0.45 %
MDC IDC STAT BRADY RV PERCENT PACED: 22.99 %

## 2016-09-18 NOTE — Progress Notes (Signed)
Remote pacemaker transmission.   

## 2016-09-21 ENCOUNTER — Encounter: Payer: Self-pay | Admitting: Cardiology

## 2016-10-05 ENCOUNTER — Encounter: Payer: Self-pay | Admitting: Cardiology

## 2016-10-18 ENCOUNTER — Encounter: Payer: Self-pay | Admitting: Podiatry

## 2016-10-18 ENCOUNTER — Ambulatory Visit (INDEPENDENT_AMBULATORY_CARE_PROVIDER_SITE_OTHER): Payer: Medicare PPO | Admitting: Podiatry

## 2016-10-18 DIAGNOSIS — M79604 Pain in right leg: Secondary | ICD-10-CM

## 2016-10-18 DIAGNOSIS — M79676 Pain in unspecified toe(s): Secondary | ICD-10-CM

## 2016-10-18 DIAGNOSIS — B351 Tinea unguium: Secondary | ICD-10-CM

## 2016-10-18 DIAGNOSIS — M79605 Pain in left leg: Secondary | ICD-10-CM

## 2016-10-18 NOTE — Progress Notes (Signed)
   Subjective:    Patient ID: [name], male    DOB: November 27, 1932, 83 y.o.   MRN: [id number]  HPI Chief Complaint  Patient presents with  . Nail Problem    Lt foot great toenail is thick.     Review of Systems  Skin: Positive for color change.  All other systems reviewed and are negative.      Objective:   Physical Exam        Assessment & Plan:

## 2016-10-19 NOTE — Progress Notes (Signed)
Subjective:    Patient ID: [name], male   DOB: 83 y.o.   MRN: [id number]   HPI patient presents with caregiver stating that the left big toenail has been thick and they were concerned with moderate discomfort    Review of Systems  All other systems reviewed and are negative.       Objective:  Physical Exam  Cardiovascular: Intact distal pulses.   Musculoskeletal: Normal range of motion.  Neurological: He is alert.  Skin: Skin is warm.  Nursing note and vitals reviewed.  neurovascular status intact muscle strength was adequate range of motion was found within normal limits with thickness of the left hallux nail distal one third that's localized in nature with no edema erythema or drainage noted associated. Patient's found have good digital perfusion well oriented 3     Assessment:   Traumatized left hallux nail with mycotic component      Plan:    H&P condition reviewed and discussed and at this point I did go ahead and debrided the distal one half nail smooth down and this seemed to satisfy patient and caregiver. Reappoint to recheck

## 2016-11-16 ENCOUNTER — Encounter: Payer: Self-pay | Admitting: Family Medicine

## 2016-11-16 ENCOUNTER — Ambulatory Visit (INDEPENDENT_AMBULATORY_CARE_PROVIDER_SITE_OTHER): Payer: Medicare PPO | Admitting: Family Medicine

## 2016-11-16 VITALS — BP 110/70 | HR 73 | Temp 98.3°F | Wt 219.9 lb

## 2016-11-16 DIAGNOSIS — F17200 Nicotine dependence, unspecified, uncomplicated: Secondary | ICD-10-CM | POA: Diagnosis not present

## 2016-11-16 DIAGNOSIS — N183 Chronic kidney disease, stage 3 unspecified: Secondary | ICD-10-CM

## 2016-11-16 DIAGNOSIS — E785 Hyperlipidemia, unspecified: Secondary | ICD-10-CM

## 2016-11-16 DIAGNOSIS — I10 Essential (primary) hypertension: Secondary | ICD-10-CM

## 2016-11-16 NOTE — Patient Instructions (Signed)
Steps to Quit Smoking Smoking tobacco can be harmful to your health and can affect almost every organ in your body. Smoking puts you, and those around you, at risk for developing many serious chronic diseases. Quitting smoking is difficult, but it is one of the best things that you can do for your health. It is never too late to quit. What are the benefits of quitting smoking? When you quit smoking, you lower your risk of developing serious diseases and conditions, such as:  Lung cancer or lung disease, such as COPD.  Heart disease.  Stroke.  Heart attack.  Infertility.  Osteoporosis and bone fractures.  Additionally, symptoms such as coughing, wheezing, and shortness of breath may get better when you quit. You may also find that you get sick less often because your body is stronger at fighting off colds and infections. If you are pregnant, quitting smoking can help to reduce your chances of having a baby of low birth weight. How do I get ready to quit? When you decide to quit smoking, create a plan to make sure that you are successful. Before you quit:  Pick a date to quit. Set a date within the next two weeks to give you time to prepare.  Write down the reasons why you are quitting. Keep this list in places where you will see it often, such as on your bathroom mirror or in your car or wallet.  Identify the people, places, things, and activities that make you want to smoke (triggers) and avoid them. Make sure to take these actions: ? Throw away all cigarettes at home, at work, and in your car. ? Throw away smoking accessories, such as ashtrays and lighters. ? Clean your car and make sure to empty the ashtray. ? Clean your home, including curtains and carpets.  Tell your family, friends, and coworkers that you are quitting. Support from your loved ones can make quitting easier.  Talk with your health care provider about your options for quitting smoking.  Find out what treatment  options are covered by your health insurance.  What strategies can I use to quit smoking? Talk with your healthcare provider about different strategies to quit smoking. Some strategies include:  Quitting smoking altogether instead of gradually lessening how much you smoke over a period of time. Research shows that quitting "cold turkey" is more successful than gradually quitting.  Attending in-person counseling to help you build problem-solving skills. You are more likely to have success in quitting if you attend several counseling sessions. Even short sessions of 10 minutes can be effective.  Finding resources and support systems that can help you to quit smoking and remain smoke-free after you quit. These resources are most helpful when you use them often. They can include: ? Online chats with a counselor. ? Telephone quitlines. ? Printed self-help materials. ? Support groups or group counseling. ? Text messaging programs. ? Mobile phone applications.  Taking medicines to help you quit smoking. (If you are pregnant or breastfeeding, talk with your health care provider first.) Some medicines contain nicotine and some do not. Both types of medicines help with cravings, but the medicines that include nicotine help to relieve withdrawal symptoms. Your health care provider may recommend: ? Nicotine patches, gum, or lozenges. ? Nicotine inhalers or sprays. ? Non-nicotine medicine that is taken by mouth.  Talk with your health care provider about combining strategies, such as taking medicines while you are also receiving in-person counseling. Using these two strategies together   makes you more likely to succeed in quitting than if you used either strategy on its own. If you are pregnant or breastfeeding, talk with your health care provider about finding counseling or other support strategies to quit smoking. Do not take medicine to help you quit smoking unless told to do so by your health care  provider. What things can I do to make it easier to quit? Quitting smoking might feel overwhelming at first, but there is a lot that you can do to make it easier. Take these important actions:  Reach out to your family and friends and ask that they support and encourage you during this time. Call telephone quitlines, reach out to support groups, or work with a counselor for support.  Ask people who smoke to avoid smoking around you.  Avoid places that trigger you to smoke, such as bars, parties, or smoke-break areas at work.  Spend time around people who do not smoke.  Lessen stress in your life, because stress can be a smoking trigger for some people. To lessen stress, try: ? Exercising regularly. ? Deep-breathing exercises. ? Yoga. ? Meditating. ? Performing a body scan. This involves closing your eyes, scanning your body from head to toe, and noticing which parts of your body are particularly tense. Purposefully relax the muscles in those areas.  Download or purchase mobile phone or tablet apps (applications) that can help you stick to your quit plan by providing reminders, tips, and encouragement. There are many free apps, such as QuitGuide from the CDC (Centers for Disease Control and Prevention). You can find other support for quitting smoking (smoking cessation) through smokefree.gov and other websites.  How will I feel when I quit smoking? Within the first 24 hours of quitting smoking, you may start to feel some withdrawal symptoms. These symptoms are usually most noticeable 2-3 days after quitting, but they usually do not last beyond 2-3 weeks. Changes or symptoms that you might experience include:  Mood swings.  Restlessness, anxiety, or irritation.  Difficulty concentrating.  Dizziness.  Strong cravings for sugary foods in addition to nicotine.  Mild weight gain.  Constipation.  Nausea.  Coughing or a sore throat.  Changes in how your medicines work in your  body.  A depressed mood.  Difficulty sleeping (insomnia).  After the first 2-3 weeks of quitting, you may start to notice more positive results, such as:  Improved sense of smell and taste.  Decreased coughing and sore throat.  Slower heart rate.  Lower blood pressure.  Clearer skin.  The ability to breathe more easily.  Fewer sick days.  Quitting smoking is very challenging for most people. Do not get discouraged if you are not successful the first time. Some people need to make many attempts to quit before they achieve long-term success. Do your best to stick to your quit plan, and talk with your health care provider if you have any questions or concerns. This information is not intended to replace advice given to you by your health care provider. Make sure you discuss any questions you have with your health care provider. Document Released: 04/24/2001 Document Revised: 12/27/2015 Document Reviewed: 09/14/2014 Elsevier Interactive Patient Education  2017 [employer]  

## 2016-11-16 NOTE — Progress Notes (Signed)
Subjective:     Patient ID: [name], male   DOB: 1932/06/22, 83 y.o.   MRN: [id number]  HPI Patient seen for routine medical follow-up. Unfortunately, his daughter passed away from what sounds like complications from cancer few weeks ago. He is handling that fairly well. He has chronic problems including ongoing nicotine use, COPD, dyspnea, hypertension, sick sinus syndrome chronic kidney disease, history of atrial fibrillation. Respiratory status stable. No recent chest pains. No recent falls.  Medications reviewed. Compliant with all. Denies any side effects. He had lipids last winter which are stable. His creatinine was up slightly above 2. He's not a follow-up since then. Denies any obstructive urinary symptoms.  Past Medical History:  Diagnosis Date  . Disturbances of sulphur-bearing amino-acid metabolism   . Malignant neoplasm of prostate ([place])   . Obstructive sleep apnea (adult) (pediatric) 2004   surgery to coorrect  . Osteoarthrosis, unspecified whether generalized or localized, unspecified site   . Other and unspecified hyperlipidemia   . Other specified cardiac dysrhythmias(427.89)   . 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 Paroxysmal atrial fibrillation (HCC)   . Unspecified cerebral artery occlusion with cerebral infarction   . Unspecified disorder resulting from impaired renal function   . Unspecified essential hypertension   . Wears glasses   . Wears partial dentures    upper partial   Past Surgical History:  Procedure Laterality Date  . APPLICATION OF A-CELL OF EXTREMITY Right 08/12/2013   Procedure: APPLICATION OF A-CELL OF EXTREMITY;  Surgeon: Theodoro Kos, DO;  Location: [place];  Service: Plastics;  Laterality: Right;  . CHOLECYSTECTOMY  1987  . COLONOSCOPY    . ent surgery  2004   correct snoring  . INGUINAL HERNIA REPAIR     left  . INSERT / REPLACE / REMOVE PACEMAKER  04/14/2013   dual chamber   / Dr Sallyanne Kuster  . MINOR  IRRIGATION AND DEBRIDEMENT OF WOUND Right 03/04/2013   Procedure: MINOR IRRIGATION AND DEBRIDEMENT OF WOUND;  Surgeon: Theodoro Kos, DO;  Location: [place];  Service: Plastics;  Laterality: Right;  . ORCHIECTOMY  2002  . PERMANENT PACEMAKER INSERTION N/A 04/14/2013   Procedure: PERMANENT PACEMAKER INSERTION;  Surgeon: [name], MD;  Location: [place] CATH LAB;  Service: Cardiovascular;  Laterality: N/A;  . SKIN SPLIT GRAFT Right 08/12/2013   Procedure: SKIN GRAFT SPLIT THICKNESS WITH PLACEMENT OF VAC TO RIGHT LOWER LEG/PLACEMENT OF ACELL TO RIGHT UPPER THIGH AREA (HARVEST SITE);  Surgeon: Theodoro Kos, DO;  Location: [place];  Service: Plastics;  Laterality: Right;    reports that he has been smoking Cigarettes.  He has a 15.00 pack-year smoking history. He has never used smokeless tobacco. He reports that he does not drink alcohol or use drugs. family history includes Prostate cancer in his father. No Known Allergies   Review of Systems  Constitutional: Negative for appetite change, fatigue and unexpected weight change.  Eyes: Negative for visual disturbance.  Respiratory: Negative for cough, chest tightness and shortness of breath.   Cardiovascular: Negative for chest pain, palpitations and leg swelling.  Gastrointestinal: Negative for abdominal pain.  Genitourinary: Negative for dysuria.  Neurological: Negative for dizziness, syncope, weakness, light-headedness and headaches.       Objective:   Physical Exam  Constitutional: He is oriented to person, place, and time. He appears well-developed and well-nourished.  HENT:  Right Ear: External ear normal.  Left Ear: External ear normal.  Mouth/Throat: Oropharynx  is clear and moist.  Eyes: Pupils are equal, round, and reactive to light.  Neck: Neck supple. No thyromegaly present.  Cardiovascular: Normal rate and regular rhythm.   Pulmonary/Chest: Effort normal and breath sounds normal. No  respiratory distress. He has no rales.  Musculoskeletal: He exhibits no edema.  Lymphadenopathy:    He has no cervical adenopathy.  Neurological: He is alert and oriented to person, place, and time.  Psychiatric: He has a normal mood and affect. His behavior is normal.       Assessment:     #1 hypertension stable and at goal  #2 history of chronic kidney disease  #3 dyslipidemia  #4 ongoing nicotine use    Plan:     -Repeat basic metabolic panel -Patient is encouraged to stop smoking. Handout given. Motivation is low. -Continue with annual flu vaccine -Routine follow-up in 6 months consider as needed  [name] MD [facility]

## 2016-12-17 ENCOUNTER — Ambulatory Visit (INDEPENDENT_AMBULATORY_CARE_PROVIDER_SITE_OTHER): Payer: Medicare PPO | Admitting: *Deleted

## 2016-12-17 DIAGNOSIS — I495 Sick sinus syndrome: Secondary | ICD-10-CM | POA: Diagnosis not present

## 2016-12-18 NOTE — Progress Notes (Signed)
Remote pacemaker transmission.   

## 2016-12-19 ENCOUNTER — Encounter: Payer: Self-pay | Admitting: Cardiology

## 2017-01-07 ENCOUNTER — Encounter: Payer: Self-pay | Admitting: Cardiology

## 2017-01-08 LAB — CUP PACEART REMOTE DEVICE CHECK
Battery Remaining Longevity: 65 mo
Brady Statistic AS VS Percent: 3.02 %
Brady Statistic RA Percent Paced: 95.1 %
Date Time Interrogation Session: 20180806185129
Implantable Lead Implant Date: 20141202
Implantable Lead Location: 753859
Implantable Lead Model: 5076
Implantable Lead Model: 5076
Implantable Pulse Generator Implant Date: 20141202
Lead Channel Impedance Value: 361 Ohm
Lead Channel Impedance Value: 456 Ohm
Lead Channel Pacing Threshold Amplitude: 1.125 V
Lead Channel Pacing Threshold Pulse Width: 0.4 ms
Lead Channel Sensing Intrinsic Amplitude: 14.75 mV
Lead Channel Sensing Intrinsic Amplitude: 14.75 mV
Lead Channel Sensing Intrinsic Amplitude: 2.5 mV
Lead Channel Sensing Intrinsic Amplitude: 2.5 mV
Lead Channel Setting Pacing Amplitude: 2.5 V
MDC IDC LEAD IMPLANT DT: 20141202
MDC IDC LEAD LOCATION: 753860
MDC IDC MSMT BATTERY VOLTAGE: 3 V
MDC IDC MSMT LEADCHNL RA PACING THRESHOLD AMPLITUDE: 0.75 V
MDC IDC MSMT LEADCHNL RV IMPEDANCE VALUE: 399 Ohm
MDC IDC MSMT LEADCHNL RV IMPEDANCE VALUE: 494 Ohm
MDC IDC MSMT LEADCHNL RV PACING THRESHOLD PULSEWIDTH: 0.4 ms
MDC IDC SET LEADCHNL RA PACING AMPLITUDE: 2 V
MDC IDC SET LEADCHNL RV PACING PULSEWIDTH: 0.4 ms
MDC IDC SET LEADCHNL RV SENSING SENSITIVITY: 0.9 mV
MDC IDC STAT BRADY AP VP PERCENT: 27.42 %
MDC IDC STAT BRADY AP VS PERCENT: 69.07 %
MDC IDC STAT BRADY AS VP PERCENT: 0.49 %
MDC IDC STAT BRADY RV PERCENT PACED: 27.84 %

## 2017-01-17 ENCOUNTER — Ambulatory Visit: Payer: Medicare PPO

## 2017-01-17 NOTE — Progress Notes (Deleted)
Subjective:   [name] is a 83 y.o. male who presents for Medicare Annual/Subsequent preventive examination.  The Patient was informed that the wellness visit is to identify future health risk and educate and initiate measures that can reduce risk for increased disease through the lifespan.    Annual Wellness Assessment  Reports health as   Preventive Screening -Counseling & Management  Medicare Annual Preventive Care Visit - Subsequent Last OV   Smoking hx; current smoker Reports .5 pack x 30 years Does not meet criteria for LDCT of the lung  Educated regarding AAA  IMM  Flu vaccine Educated regarding shingrix    Describes Health as poor, fair, good or great?   VS reviewed;   Diet   BMI  Exercise  Diabetic eye exam + for retinopathy   Dental  Stressors:   Sleep patterns:   Pain?    Cardiac Risk Factors Addressed Hyperlipidemia - chol/hdl ratio 4; col 190; hdl 51; trig 182 Diabetes - neg Smoker Overweight    Advanced Directives  Patient Care Team: [name], MD as PCP - General (Family Medicine) Assessed for additional providers  Immunization History  Administered Date(s) Administered  . Influenza Split 03/22/2011, 04/01/2012  . Influenza Whole 02/18/2009, 04/13/2010  . Influenza, High Dose Seasonal PF 05/10/2014, 05/17/2015, 03/28/2016  . Influenza,inj,Quad PF,6+ Mos 02/16/2013  . Pneumococcal Conjugate-13 11/11/2013  . Pneumococcal Polysaccharide-23 05/18/2016  . Tdap 11/22/2011   Required Immunizations needed today  Screening test up to date or reviewed for plan of completion Health Maintenance Due  Topic Date Due  . INFLUENZA VACCINE  12/12/2016            Objective:    Vitals: There were no vitals taken for this visit.  There is no height or weight on file to calculate BMI.  Tobacco History  Smoking Status  . Current Some Day Smoker  . Packs/day: 0.50  . Years: 30.00  . Types: Cigarettes  Smokeless  Tobacco  . Never Used    Comment:  pt stated he smokes 1 pack every 2 wks     Ready to quit: Not Answered Counseling given: Not Answered   Past Medical History:  Diagnosis Date  . Disturbances of sulphur-bearing amino-acid metabolism   . Malignant neoplasm of prostate ([place])   . Obstructive sleep apnea (adult) (pediatric) 2004   surgery to coorrect  . Osteoarthrosis, unspecified whether generalized or localized, unspecified site   . Other and unspecified hyperlipidemia   . Other specified cardiac dysrhythmias(427.89)   . 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 Paroxysmal atrial fibrillation (HCC)   . Unspecified cerebral artery occlusion with cerebral infarction   . Unspecified disorder resulting from impaired renal function   . Unspecified essential hypertension   . Wears glasses   . Wears partial dentures    upper partial   Past Surgical History:  Procedure Laterality Date  . APPLICATION OF A-CELL OF EXTREMITY Right 08/12/2013   Procedure: APPLICATION OF A-CELL OF EXTREMITY;  Surgeon: Theodoro Kos, DO;  Location: [place];  Service: Plastics;  Laterality: Right;  . CHOLECYSTECTOMY  1987  . COLONOSCOPY    . ent surgery  2004   correct snoring  . INGUINAL HERNIA REPAIR     left  . INSERT / REPLACE / REMOVE PACEMAKER  04/14/2013   dual chamber   / Dr Sallyanne Kuster  . MINOR IRRIGATION AND DEBRIDEMENT OF WOUND Right 03/04/2013   Procedure: MINOR IRRIGATION AND DEBRIDEMENT  OF WOUND;  Surgeon: Theodoro Kos, DO;  Location: [place];  Service: Plastics;  Laterality: Right;  . ORCHIECTOMY  2002  . PERMANENT PACEMAKER INSERTION N/A 04/14/2013   Procedure: PERMANENT PACEMAKER INSERTION;  Surgeon: [name], MD;  Location: [place] CATH LAB;  Service: Cardiovascular;  Laterality: N/A;  . SKIN SPLIT GRAFT Right 08/12/2013   Procedure: SKIN GRAFT SPLIT THICKNESS WITH PLACEMENT OF VAC TO RIGHT LOWER LEG/PLACEMENT OF ACELL TO RIGHT UPPER THIGH AREA  (HARVEST SITE);  Surgeon: Theodoro Kos, DO;  Location: [place];  Service: Plastics;  Laterality: Right;   Family History  Problem Relation Age of Onset  . Prostate cancer Father    History  Sexual Activity  . Sexual activity: Not on file    Comment: started smoking 2-3 cig daily    Outpatient Encounter Prescriptions as of 01/17/2017  Medication Sig  . Albuterol Sulfate (PROAIR RESPICLICK) 625 (90 Base) MCG/ACT AEPB Inhale 1 puff into the lungs as directed. Every 4-6 hours as needed  . amLODipine (NORVASC) 5 MG tablet TAKE 1 TABLET EVERY DAY  . clopidogrel (PLAVIX) 75 MG tablet TAKE 1 TABLET EVERY DAY WITH BREAKFAST  . losartan (COZAAR) 50 MG tablet TAKE 1 TABLET EVERY DAY  . metoprolol tartrate (LOPRESSOR) 25 MG tablet TAKE 1 TABLET TWICE DAILY  . Multiple Vitamin (MULTIVITAMIN WITH MINERALS) TABS tablet Take 1 tablet by mouth daily.  . simvastatin (ZOCOR) 40 MG tablet TAKE 1 TABLET EVERY DAY   No facility-administered encounter medications on file as of 01/17/2017.     Activities of Daily Living No flowsheet data found.  Patient Care Team: [name], MD as PCP - General (Family Medicine)   Assessment:    *** Exercise Activities and Dietary recommendations    Goals    None     Fall Risk Fall Risk  11/16/2016 05/17/2015 05/17/2015 11/11/2013 12/31/2012  Falls in the past year? No No No No No   Depression Screen PHQ 2/9 Scores 11/16/2016 05/17/2015 11/11/2013 12/31/2012  PHQ - 2 Score 0 0 0 0    Cognitive Function        Immunization History  Administered Date(s) Administered  . Influenza Split 03/22/2011, 04/01/2012  . Influenza Whole 02/18/2009, 04/13/2010  . Influenza, High Dose Seasonal PF 05/10/2014, 05/17/2015, 03/28/2016  . Influenza,inj,Quad PF,6+ Mos 02/16/2013  . Pneumococcal Conjugate-13 11/11/2013  . Pneumococcal Polysaccharide-23 05/18/2016  . Tdap 11/22/2011   Screening Tests Health Maintenance  Topic Date Due  . INFLUENZA VACCINE   12/12/2016  . TETANUS/TDAP  11/21/2021  . PNA vac Low Risk Adult  Completed      Plan:   ***  I have personally reviewed and noted the following in the patient's chart:   . Medical and social history . Use of alcohol, tobacco or illicit drugs  . Current medications and supplements . Functional ability and status . Nutritional status . Physical activity . Advanced directives . List of other physicians . Hospitalizations, surgeries, and ER visits in previous 12 months . Vitals . Screenings to include cognitive, depression, and falls . Referrals and appointments  In addition, I have reviewed and discussed with patient certain preventive protocols, quality metrics, and best practice recommendations. A written personalized care plan for preventive services as well as general preventive health recommendations were provided to patient.     Wynetta Fines, RN  01/17/2017

## 2017-01-28 ENCOUNTER — Other Ambulatory Visit: Payer: Self-pay | Admitting: Family Medicine

## 2017-01-31 ENCOUNTER — Encounter: Payer: Self-pay | Admitting: Family Medicine

## 2017-02-04 ENCOUNTER — Other Ambulatory Visit: Payer: Self-pay | Admitting: Family Medicine

## 2017-03-18 ENCOUNTER — Ambulatory Visit (INDEPENDENT_AMBULATORY_CARE_PROVIDER_SITE_OTHER): Payer: Medicare PPO | Admitting: *Deleted

## 2017-03-18 DIAGNOSIS — I495 Sick sinus syndrome: Secondary | ICD-10-CM | POA: Diagnosis not present

## 2017-03-19 NOTE — Progress Notes (Signed)
Remote pacemaker transmission.   

## 2017-03-20 ENCOUNTER — Encounter: Payer: Self-pay | Admitting: Family Medicine

## 2017-03-20 ENCOUNTER — Ambulatory Visit: Payer: Medicare PPO | Admitting: Family Medicine

## 2017-03-20 ENCOUNTER — Encounter: Payer: Self-pay | Admitting: Cardiology

## 2017-03-20 VITALS — BP 120/80 | HR 81 | Temp 97.7°F | Wt 220.2 lb

## 2017-03-20 DIAGNOSIS — F17200 Nicotine dependence, unspecified, uncomplicated: Secondary | ICD-10-CM | POA: Diagnosis not present

## 2017-03-20 DIAGNOSIS — Z23 Encounter for immunization: Secondary | ICD-10-CM

## 2017-03-20 DIAGNOSIS — R21 Rash and other nonspecific skin eruption: Secondary | ICD-10-CM | POA: Diagnosis not present

## 2017-03-20 DIAGNOSIS — R062 Wheezing: Secondary | ICD-10-CM | POA: Diagnosis not present

## 2017-03-20 MED ORDER — ALBUTEROL SULFATE 108 (90 BASE) MCG/ACT IN AEPB: 2.0000 | INHALATION_SPRAY | RESPIRATORY_TRACT | 2 refills | Status: DC

## 2017-03-20 NOTE — Progress Notes (Signed)
Subjective:     Patient ID: [name], male   DOB: Nov 20, 1932, 83 y.o.   MRN: [id number]  HPI Patient with skin rash. Developed several days ago. Somewhat nummular oval shaped patches on his trunk. These are scaly and slightly itchy. He's tried his wife's triamcinolone with minimal improvement. He actually has appointment tomorrow already with dermatology. He made appointment here initially but then was able to get in to see dermatologist. Denies any recent change of soap or detergent.  Patient has long history of ongoing nicotine use. Intermittent wheezing. Requesting refill albuterol which he uses as needed.  Past Medical History:  Diagnosis Date  . Disturbances of sulphur-bearing amino-acid metabolism   . Malignant neoplasm of prostate ([place])   . Obstructive sleep apnea (adult) (pediatric) 2004   surgery to coorrect  . Osteoarthrosis, unspecified whether generalized or localized, unspecified site   . Other and unspecified hyperlipidemia   . Other specified cardiac dysrhythmias(427.89)   . 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 Paroxysmal atrial fibrillation (HCC)   . Unspecified cerebral artery occlusion with cerebral infarction   . Unspecified disorder resulting from impaired renal function   . Unspecified essential hypertension   . Wears glasses   . Wears partial dentures    upper partial   Past Surgical History:  Procedure Laterality Date  . CHOLECYSTECTOMY  1987  . COLONOSCOPY    . ent surgery  2004   correct snoring  . INGUINAL HERNIA REPAIR     left  . INSERT / REPLACE / REMOVE PACEMAKER  04/14/2013   dual chamber   / Dr Sallyanne Kuster  . ORCHIECTOMY  2002    reports that he has been smoking cigarettes.  He has a 15.00 pack-year smoking history. he has never used smokeless tobacco. He reports that he does not drink alcohol or use drugs. family history includes Prostate cancer in his father. No Known Allergies   Review of Systems  Constitutional:  Negative for chills and fever.  Respiratory: Negative for cough and shortness of breath.   Cardiovascular: Negative for chest pain.  Skin: Positive for rash.       Objective:   Physical Exam  Constitutional: He appears well-developed and well-nourished.  Cardiovascular: Normal rate and regular rhythm.  Pulmonary/Chest:  Slightly diminished breath sounds throughout. No active wheezing.  Skin: Rash noted.  Patient scattered rash on his trunk. This is macular slightly hyperpigmented well demarcated and scaly surface. Several of these are nummular or oval shaped       Assessment:     #1 Skin rash involving trunk-question nummular eczema  #2 Intermittent wheezing. Suspect COPD.  #3 ongoing nicotine use.    Plan:     -We elected not to treat his rash further since he has appointment tomorrow with dermatologist already. Will put in referral for that -Refill albuterol but caution against overusing -Flu vaccine given -strongly advised smoking cessation.  [name] MD [facility]

## 2017-03-21 LAB — CUP PACEART REMOTE DEVICE CHECK
Brady Statistic AP VP Percent: 18.49 %
Brady Statistic AP VS Percent: 79.59 %
Brady Statistic AS VP Percent: 0.22 %
Brady Statistic RV Percent Paced: 18.69 %
Date Time Interrogation Session: 20181106162438
Implantable Lead Implant Date: 20141202
Implantable Lead Location: 753859
Implantable Lead Model: 5076
Lead Channel Impedance Value: 437 Ohm
Lead Channel Impedance Value: 513 Ohm
Lead Channel Sensing Intrinsic Amplitude: 16.625 mV
Lead Channel Sensing Intrinsic Amplitude: 16.625 mV
Lead Channel Setting Pacing Amplitude: 2 V
Lead Channel Setting Pacing Amplitude: 2.5 V
Lead Channel Setting Pacing Pulse Width: 0.4 ms
Lead Channel Setting Sensing Sensitivity: 0.9 mV
MDC IDC LEAD IMPLANT DT: 20141202
MDC IDC LEAD LOCATION: 753860
MDC IDC MSMT BATTERY REMAINING LONGEVITY: 63 mo
MDC IDC MSMT BATTERY VOLTAGE: 3 V
MDC IDC MSMT LEADCHNL RA IMPEDANCE VALUE: 342 Ohm
MDC IDC MSMT LEADCHNL RA PACING THRESHOLD AMPLITUDE: 0.75 V
MDC IDC MSMT LEADCHNL RA PACING THRESHOLD PULSEWIDTH: 0.4 ms
MDC IDC MSMT LEADCHNL RA SENSING INTR AMPL: 2.5 mV
MDC IDC MSMT LEADCHNL RA SENSING INTR AMPL: 2.5 mV
MDC IDC MSMT LEADCHNL RV IMPEDANCE VALUE: 380 Ohm
MDC IDC MSMT LEADCHNL RV PACING THRESHOLD AMPLITUDE: 1 V
MDC IDC MSMT LEADCHNL RV PACING THRESHOLD PULSEWIDTH: 0.4 ms
MDC IDC PG IMPLANT DT: 20141202
MDC IDC STAT BRADY AS VS PERCENT: 1.69 %
MDC IDC STAT BRADY RA PERCENT PACED: 97.43 %

## 2017-03-25 ENCOUNTER — Telehealth: Payer: Self-pay

## 2017-03-25 NOTE — Telephone Encounter (Signed)
May fill Ventolin 2 puffs every 6 hours as needed for cough and wheeze with 3 refills

## 2017-03-25 NOTE — Telephone Encounter (Signed)
Plan does NOT cover Albuterol Sulfate (PROAIR RESPICLICK) 225 (90 Base) MCG/ACT AEPB. Per Rx benefit plan alternative medications include: VENTOLIN please fax back with approval along with strength, directions, quantity, and refills.

## 2017-03-26 MED ORDER — ALBUTEROL SULFATE HFA 108 (90 BASE) MCG/ACT IN AERS: 2.0000 | INHALATION_SPRAY | Freq: Four times a day (QID) | RESPIRATORY_TRACT | 3 refills | Status: DC | PRN

## 2017-03-26 NOTE — Telephone Encounter (Signed)
Ventolin Rx was sent in and Proair was discontinue.

## 2017-04-01 ENCOUNTER — Other Ambulatory Visit: Payer: Self-pay | Admitting: Family Medicine

## 2017-04-08 ENCOUNTER — Other Ambulatory Visit: Payer: Self-pay | Admitting: Family Medicine

## 2017-05-17 ENCOUNTER — Encounter: Payer: Self-pay | Admitting: Family Medicine

## 2017-05-17 ENCOUNTER — Ambulatory Visit: Payer: Medicare PPO | Admitting: Family Medicine

## 2017-05-17 VITALS — BP 120/86 | HR 84 | Temp 97.6°F | Wt 220.0 lb

## 2017-05-17 DIAGNOSIS — N183 Chronic kidney disease, stage 3 unspecified: Secondary | ICD-10-CM

## 2017-05-17 DIAGNOSIS — E785 Hyperlipidemia, unspecified: Secondary | ICD-10-CM | POA: Diagnosis not present

## 2017-05-17 DIAGNOSIS — F17200 Nicotine dependence, unspecified, uncomplicated: Secondary | ICD-10-CM

## 2017-05-17 DIAGNOSIS — I10 Essential (primary) hypertension: Secondary | ICD-10-CM | POA: Diagnosis not present

## 2017-05-17 LAB — LIPID PANEL
CHOLESTEROL: 198 mg/dL (ref 0–200)
HDL: 46 mg/dL (ref >39.00)
NonHDL: 151.59
TRIGLYCERIDES: 211 mg/dL — AB (ref 0.0–149.0)
Total CHOL/HDL Ratio: 4
VLDL: 42.2 mg/dL — ABNORMAL HIGH (ref 0.0–40.0)

## 2017-05-17 LAB — COMPREHENSIVE METABOLIC PANEL
ALK PHOS: 103 U/L (ref 39–117)
ALT: 16 U/L (ref 0–53)
AST: 16 U/L (ref 0–37)
Albumin: 4 g/dL (ref 3.5–5.2)
BILIRUBIN TOTAL: 0.3 mg/dL (ref 0.2–1.2)
BUN: 17 mg/dL (ref 6–23)
CALCIUM: 10.4 mg/dL (ref 8.4–10.5)
CO2: 26 mEq/L (ref 19–32)
Chloride: 109 mEq/L (ref 96–112)
Creatinine, Ser: 1.88 mg/dL — ABNORMAL HIGH (ref 0.40–1.50)
GFR: 44.39 mL/min — AB (ref >60.00)
Glucose, Bld: 138 mg/dL — ABNORMAL HIGH (ref 70–99)
Potassium: 4.7 mEq/L (ref 3.5–5.1)
Sodium: 143 mEq/L (ref 135–145)
TOTAL PROTEIN: 6.7 g/dL (ref 6.0–8.3)

## 2017-05-17 LAB — LDL CHOLESTEROL, DIRECT: Direct LDL: 112 mg/dL

## 2017-05-17 NOTE — Progress Notes (Signed)
Subjective:     Patient ID: [name], male   DOB: 27-May-1932, 84 y.o.   MRN: [id number]  HPI Patient here for medical follow-up. He has history of obesity, ongoing nicotine use, hyperlipidemia, hypertension, chronic kidney disease, sick sinus syndrome, history of atrial fibrillation, obstructive sleep apnea, history of prostate cancer.  Doing reasonably well. He had a daughter that passed away last year from cancer but he is coping fairly well. Still smokes half pack cigarettes per day. Denies any chest pains. No dyspnea with simple activities of daily living. Denies any chronic cough. Medications reviewed. Compliant with all. Denies any side effects. His wife had recent flu. Patient had vaccine  Past Medical History:  Diagnosis Date  . Disturbances of sulphur-bearing amino-acid metabolism   . Malignant neoplasm of prostate ([place])   . Obstructive sleep apnea (adult) (pediatric) 2004   surgery to coorrect  . Osteoarthrosis, unspecified whether generalized or localized, unspecified site   . Other and unspecified hyperlipidemia   . Other specified cardiac dysrhythmias(427.89)   . 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 Paroxysmal atrial fibrillation (HCC)   . Unspecified cerebral artery occlusion with cerebral infarction   . Unspecified disorder resulting from impaired renal function   . Unspecified essential hypertension   . Wears glasses   . Wears partial dentures    upper partial   Past Surgical History:  Procedure Laterality Date  . APPLICATION OF A-CELL OF EXTREMITY Right 08/12/2013   Procedure: APPLICATION OF A-CELL OF EXTREMITY;  Surgeon: Theodoro Kos, DO;  Location: [place];  Service: Plastics;  Laterality: Right;  . CHOLECYSTECTOMY  1987  . COLONOSCOPY    . ent surgery  2004   correct snoring  . INGUINAL HERNIA REPAIR     left  . INSERT / REPLACE / REMOVE PACEMAKER  04/14/2013   dual chamber   / Dr Sallyanne Kuster  . MINOR IRRIGATION AND  DEBRIDEMENT OF WOUND Right 03/04/2013   Procedure: MINOR IRRIGATION AND DEBRIDEMENT OF WOUND;  Surgeon: Theodoro Kos, DO;  Location: [place];  Service: Plastics;  Laterality: Right;  . ORCHIECTOMY  2002  . PERMANENT PACEMAKER INSERTION N/A 04/14/2013   Procedure: PERMANENT PACEMAKER INSERTION;  Surgeon: [name], MD;  Location: [place] CATH LAB;  Service: Cardiovascular;  Laterality: N/A;  . SKIN SPLIT GRAFT Right 08/12/2013   Procedure: SKIN GRAFT SPLIT THICKNESS WITH PLACEMENT OF VAC TO RIGHT LOWER LEG/PLACEMENT OF ACELL TO RIGHT UPPER THIGH AREA (HARVEST SITE);  Surgeon: Theodoro Kos, DO;  Location: [place];  Service: Plastics;  Laterality: Right;    reports that he has been smoking cigarettes.  He has a 15.00 pack-year smoking history. he has never used smokeless tobacco. He reports that he does not drink alcohol or use drugs. family history includes Prostate cancer in his father. No Known Allergies   Review of Systems  Constitutional: Negative for appetite change, fatigue and unexpected weight change.  Eyes: Negative for visual disturbance.  Respiratory: Negative for cough, chest tightness and shortness of breath.   Cardiovascular: Negative for chest pain, palpitations and leg swelling.  Endocrine: Negative for polydipsia and polyuria.  Musculoskeletal: Positive for arthralgias.  Neurological: Negative for dizziness, syncope, weakness, light-headedness and headaches.       Objective:   Physical Exam  Constitutional: He is oriented to person, place, and time. He appears well-developed and well-nourished.  HENT:  Right Ear: External ear normal.  Left Ear: External ear normal.  Mouth/Throat: Oropharynx  is clear and moist.  Eyes: Pupils are equal, round, and reactive to light.  Neck: Neck supple. No thyromegaly present.  Cardiovascular: Normal rate and regular rhythm.  Pulmonary/Chest: Effort normal and breath sounds normal. No respiratory  distress. He has no wheezes. He has no rales.  Musculoskeletal: He exhibits no edema.  Neurological: He is alert and oriented to person, place, and time.       Assessment:     #1 hypertension stable at goal  #2 hyperlipidemia on simvastatin  #3 chronic kidney disease  #4  ongoing nicotine use    Plan:     -Check labs with comprehensive metabolic panel and lipid panel -Discussed smoking cessation. His current motivation is low. -Routine follow-up in 6 months and sooner as needed  [name] MD [facility]

## 2017-06-11 ENCOUNTER — Other Ambulatory Visit: Payer: Self-pay | Admitting: Family Medicine

## 2017-06-17 ENCOUNTER — Telehealth: Payer: Self-pay | Admitting: Cardiology

## 2017-06-17 ENCOUNTER — Ambulatory Visit (INDEPENDENT_AMBULATORY_CARE_PROVIDER_SITE_OTHER): Payer: Medicare PPO | Admitting: *Deleted

## 2017-06-17 DIAGNOSIS — I495 Sick sinus syndrome: Secondary | ICD-10-CM

## 2017-06-17 NOTE — Telephone Encounter (Signed)
Spoke with pt and reminded pt of remote transmission that is due today. Pt verbalized understanding.   

## 2017-06-18 ENCOUNTER — Encounter: Payer: Self-pay | Admitting: Cardiology

## 2017-06-18 NOTE — Progress Notes (Signed)
Remote pacemaker transmission.   

## 2017-06-25 ENCOUNTER — Other Ambulatory Visit: Payer: Self-pay | Admitting: Urology

## 2017-06-25 DIAGNOSIS — Z192 Hormone resistant malignancy status: Secondary | ICD-10-CM

## 2017-07-04 ENCOUNTER — Encounter (HOSPITAL_COMMUNITY)
Admission: RE | Admit: 2017-07-04 | Discharge: 2017-07-04 | Disposition: A | Discharge status: Home / Self Care | Payer: Medicare PPO | Source: Ambulatory Visit | Attending: Urology | Admitting: Urology

## 2017-07-04 DIAGNOSIS — C61 Malignant neoplasm of prostate: Secondary | ICD-10-CM | POA: Diagnosis not present

## 2017-07-04 DIAGNOSIS — Z192 Hormone resistant malignancy status: Secondary | ICD-10-CM | POA: Insufficient documentation

## 2017-07-04 DIAGNOSIS — C7951 Secondary malignant neoplasm of bone: Secondary | ICD-10-CM | POA: Diagnosis not present

## 2017-07-04 MED ORDER — AXUMIN (FLUCICLOVINE F 18) INJECTION
9.1000 | Freq: Once | INTRAVENOUS | Status: AC
  Administered 2017-07-04: 9.1 via INTRAVENOUS

## 2017-07-09 LAB — CUP PACEART REMOTE DEVICE CHECK
Battery Voltage: 3 V
Brady Statistic AP VP Percent: 20.31 %
Brady Statistic AS VP Percent: 0.23 %
Brady Statistic RA Percent Paced: 97.02 %
Brady Statistic RV Percent Paced: 20.46 %
Date Time Interrogation Session: 20190204215147
Implantable Lead Implant Date: 20141202
Implantable Lead Location: 753859
Implantable Lead Model: 5076
Implantable Pulse Generator Implant Date: 20141202
Lead Channel Impedance Value: 380 Ohm
Lead Channel Impedance Value: 418 Ohm
Lead Channel Impedance Value: 494 Ohm
Lead Channel Pacing Threshold Amplitude: 0.625 V
Lead Channel Pacing Threshold Amplitude: 1.375 V
Lead Channel Pacing Threshold Pulse Width: 0.4 ms
Lead Channel Setting Pacing Amplitude: 2 V
Lead Channel Setting Pacing Amplitude: 2.75 V
Lead Channel Setting Sensing Sensitivity: 0.9 mV
MDC IDC LEAD IMPLANT DT: 20141202
MDC IDC LEAD LOCATION: 753860
MDC IDC MSMT BATTERY REMAINING LONGEVITY: 62 mo
MDC IDC MSMT LEADCHNL RA IMPEDANCE VALUE: 342 Ohm
MDC IDC MSMT LEADCHNL RA SENSING INTR AMPL: 2.875 mV
MDC IDC MSMT LEADCHNL RA SENSING INTR AMPL: 2.875 mV
MDC IDC MSMT LEADCHNL RV PACING THRESHOLD PULSEWIDTH: 0.4 ms
MDC IDC MSMT LEADCHNL RV SENSING INTR AMPL: 13.5 mV
MDC IDC MSMT LEADCHNL RV SENSING INTR AMPL: 13.5 mV
MDC IDC SET LEADCHNL RV PACING PULSEWIDTH: 0.4 ms
MDC IDC STAT BRADY AP VS PERCENT: 77.4 %
MDC IDC STAT BRADY AS VS PERCENT: 2.06 %

## 2017-07-18 DIAGNOSIS — C775 Secondary and unspecified malignant neoplasm of intrapelvic lymph nodes: Secondary | ICD-10-CM | POA: Diagnosis not present

## 2017-07-18 DIAGNOSIS — C61 Malignant neoplasm of prostate: Secondary | ICD-10-CM | POA: Diagnosis not present

## 2017-07-18 DIAGNOSIS — Z192 Hormone resistant malignancy status: Secondary | ICD-10-CM | POA: Diagnosis not present

## 2017-07-18 DIAGNOSIS — C7951 Secondary malignant neoplasm of bone: Secondary | ICD-10-CM | POA: Diagnosis not present

## 2017-07-24 DIAGNOSIS — Z192 Hormone resistant malignancy status: Secondary | ICD-10-CM | POA: Diagnosis not present

## 2017-07-24 DIAGNOSIS — C61 Malignant neoplasm of prostate: Secondary | ICD-10-CM | POA: Diagnosis not present

## 2017-07-31 ENCOUNTER — Ambulatory Visit (INDEPENDENT_AMBULATORY_CARE_PROVIDER_SITE_OTHER): Payer: Medicare PPO | Admitting: Family Medicine

## 2017-07-31 ENCOUNTER — Ambulatory Visit (INDEPENDENT_AMBULATORY_CARE_PROVIDER_SITE_OTHER): Payer: Medicare PPO

## 2017-07-31 ENCOUNTER — Encounter: Payer: Self-pay | Admitting: Family Medicine

## 2017-07-31 VITALS — BP 110/80 | HR 90 | Ht 71.0 in | Wt 222.0 lb

## 2017-07-31 VITALS — BP 110/80 | HR 90 | Temp 98.1°F | Wt 222.1 lb

## 2017-07-31 DIAGNOSIS — R059 Cough, unspecified: Secondary | ICD-10-CM

## 2017-07-31 DIAGNOSIS — Z0000 Encounter for general adult medical examination without abnormal findings: Secondary | ICD-10-CM

## 2017-07-31 DIAGNOSIS — R05 Cough: Secondary | ICD-10-CM | POA: Diagnosis not present

## 2017-07-31 MED ORDER — ALBUTEROL SULFATE HFA 108 (90 BASE) MCG/ACT IN AERS: 2.0000 | INHALATION_SPRAY | Freq: Four times a day (QID) | RESPIRATORY_TRACT | 3 refills | Status: DC | PRN

## 2017-07-31 MED ORDER — PREDNISONE 10 MG PO TABS: ORAL_TABLET | ORAL | 0 refills | Status: DC

## 2017-07-31 MED ORDER — METOPROLOL TARTRATE 25 MG PO TABS: 25.0000 mg | ORAL_TABLET | Freq: Two times a day (BID) | ORAL | 2 refills | Status: DC

## 2017-07-31 MED ORDER — CEFUROXIME AXETIL 250 MG PO TABS: 250.0000 mg | ORAL_TABLET | Freq: Two times a day (BID) | ORAL | 0 refills | Status: DC

## 2017-07-31 NOTE — Patient Instructions (Addendum)
[name] , Thank you for taking time to come for your Medicare Wellness Visit. I appreciate your ongoing commitment to your health goals. Please review the following plan we discussed and let me know if I can assist you in the future.   Shingrix is a vaccine for the prevention of Shingles in Adults 50 and older.  If you are on Medicare, you can request a prescription from your doctor to be filled at a pharmacy.  Please check with your benefits regarding applicable copays or out of pocket expenses.  The Shingrix is given in 2 vaccines approx 8 weeks apart. You must receive the 2nd dose prior to 6 months from receipt of the first.  These are the goals we discussed: Goals    . Quit Smoking     What are the options to eating;  One option would be to chew gum Another options would be computer         This is a list of the screening recommended for you and due dates:  Health Maintenance  Topic Date Due  . Tetanus Vaccine  11/21/2021  . Flu Shot  Completed  . Pneumonia vaccines  Completed   Prevention of falls: Remove rugs or any tripping hazards in the home Use Non slip mats in bathtubs and showers Placing grab bars next to the toilet and or shower Placing handrails on both sides of the stair way Adding extra lighting in the home.   Personal safety issues reviewed:  1. Consider starting a community watch program per [facility] 2.  Changes batteries is smoke detector and/or carbon monoxide detector  3.  If you have firearms; keep them in a safe place 4.  Wear protection when in the sun; Always wear sunscreen or a hat; It is good to have your doctor check your skin annually or review any new areas of concern 5. Driving safety; Keep in the right lane; stay 3 car lengths behind the car in front of you on the highway; look 3 times prior to pulling out; carry your cell phone everywhere you go!    Learn about the Yellow Dot program:  The program allows first responders  at your emergency to have access to who your physician is, as well as your medications and medical conditions.  Citizens requesting the Yellow Dot Packages should contact Master Corporal Nunzio Cobbs at the [facility] [phone] for the first week of the program and beginning the week after Easter citizens should contact their Scientist, physiological.      Fall Prevention in the Home Falls can cause injuries. They can happen to people of all ages. There are many things you can do to make your home safe and to help prevent falls. What can I do on the outside of my home?  Regularly fix the edges of walkways and driveways and fix any cracks.  Remove anything that might make you trip as you walk through a door, such as a raised step or threshold.  Trim any bushes or trees on the path to your home.  Use bright outdoor lighting.  Clear any walking paths of anything that might make someone trip, such as rocks or tools.  Regularly check to see if handrails are loose or broken. Make sure that both sides of any steps have handrails.  Any raised decks and porches should have guardrails on the edges.  Have any leaves, snow, or ice cleared regularly.  Use sand or salt  on walking paths during winter.  Clean up any spills in your garage right away. This includes oil or grease spills. What can I do in the bathroom?  Use night lights.  Install grab bars by the toilet and in the tub and shower. Do not use towel bars as grab bars.  Use non-skid mats or decals in the tub or shower.  If you need to sit down in the shower, use a plastic, non-slip stool.  Keep the floor dry. Clean up any water that spills on the floor as soon as it happens.  Remove soap buildup in the tub or shower regularly.  Attach bath mats securely with double-sided non-slip rug tape.  Do not have throw rugs and other things on the floor that can make you trip. What can I do in the  bedroom?  Use night lights.  Make sure that you have a light by your bed that is easy to reach.  Do not use any sheets or blankets that are too big for your bed. They should not hang down onto the floor.  Have a firm chair that has side arms. You can use this for support while you get dressed.  Do not have throw rugs and other things on the floor that can make you trip. What can I do in the kitchen?  Clean up any spills right away.  Avoid walking on wet floors.  Keep items that you use a lot in easy-to-reach places.  If you need to reach something above you, use a strong step stool that has a grab bar.  Keep electrical cords out of the way.  Do not use floor polish or wax that makes floors slippery. If you must use wax, use non-skid floor wax.  Do not have throw rugs and other things on the floor that can make you trip. What can I do with my stairs?  Do not leave any items on the stairs.  Make sure that there are handrails on both sides of the stairs and use them. Fix handrails that are broken or loose. Make sure that handrails are as long as the stairways.  Check any carpeting to make sure that it is firmly attached to the stairs. Fix any carpet that is loose or worn.  Avoid having throw rugs at the top or bottom of the stairs. If you do have throw rugs, attach them to the floor with carpet tape.  Make sure that you have a light switch at the top of the stairs and the bottom of the stairs. If you do not have them, ask someone to add them for you. What else can I do to help prevent falls?  Wear shoes that: ? Do not have high heels. ? Have rubber bottoms. ? Are comfortable and fit you well. ? Are closed at the toe. Do not wear sandals.  If you use a stepladder: ? Make sure that it is fully opened. Do not climb a closed stepladder. ? Make sure that both sides of the stepladder are locked into place. ? Ask someone to hold it for you, if possible.  Clearly mark and make  sure that you can see: ? Any grab bars or handrails. ? First and last steps. ? Where the edge of each step is.  Use tools that help you move around (mobility aids) if they are needed. These include: ? Canes. ? Walkers. ? Scooters. ? Crutches.  Turn on the lights when you go into a dark area.  Replace any light bulbs as soon as they burn out.  Set up your furniture so you have a clear path. Avoid moving your furniture around.  If any of your floors are uneven, fix them.  If there are any pets around you, be aware of where they are.  Review your medicines with your doctor. Some medicines can make you feel dizzy. This can increase your chance of falling. Ask your doctor what other things that you can do to help prevent falls. This information is not intended to replace advice given to you by your health care provider. Make sure you discuss any questions you have with your health care provider. Document Released: 02/24/2009 Document Revised: 10/06/2015 Document Reviewed: 06/04/2014 Elsevier Interactive Patient Education  2018 Nordic Maintenance, Male A healthy lifestyle and preventive care is important for your health and wellness. Ask your health care provider about what schedule of regular examinations is right for you. What should I know about weight and diet? Eat a Healthy Diet  Eat plenty of vegetables, fruits, whole grains, low-fat dairy products, and lean protein.  Do not eat a lot of foods high in solid fats, added sugars, or salt.  Maintain a Healthy Weight Regular exercise can help you achieve or maintain a healthy weight. You should:  Do at least 150 minutes of exercise each week. The exercise should increase your heart rate and make you sweat (moderate-intensity exercise).  Do strength-training exercises at least twice a week.  Watch Your Levels of Cholesterol and Blood Lipids  Have your blood tested for lipids and cholesterol every 5 years starting  at 82 years of age. If you are at high risk for heart disease, you should start having your blood tested when you are 82 years old. You may need to have your cholesterol levels checked more often if: ? Your lipid or cholesterol levels are high. ? You are older than 82 years of age. ? You are at high risk for heart disease.  What should I know about cancer screening? Many types of cancers can be detected early and may often be prevented. Lung Cancer  You should be screened every year for lung cancer if: ? You are a current smoker who has smoked for at least 30 years. ? You are a former smoker who has quit within the past 15 years.  Talk to your health care provider about your screening options, when you should start screening, and how often you should be screened.  Colorectal Cancer  Routine colorectal cancer screening usually begins at 82 years of age and should be repeated every 5-10 years until you are 82 years old. You may need to be screened more often if early forms of precancerous polyps or small growths are found. Your health care provider may recommend screening at an earlier age if you have risk factors for colon cancer.  Your health care provider may recommend using home test kits to check for hidden blood in the stool.  A small camera at the end of a tube can be used to examine your colon (sigmoidoscopy or colonoscopy). This checks for the earliest forms of colorectal cancer.  Prostate and Testicular Cancer  Depending on your age and overall health, your health care provider may do certain tests to screen for prostate and testicular cancer.  Talk to your health care provider about any symptoms or concerns you have about testicular or prostate cancer.  Skin Cancer  Check your skin from head to  toe regularly.  Tell your health care provider about any new moles or changes in moles, especially if: ? There is a change in a mole's size, shape, or color. ? You have a mole that  is larger than a pencil eraser.  Always use sunscreen. Apply sunscreen liberally and repeat throughout the day.  Protect yourself by wearing long sleeves, pants, a wide-brimmed hat, and sunglasses when outside.  What should I know about heart disease, diabetes, and high blood pressure?  If you are 14-88 years of age, have your blood pressure checked every 3-5 years. If you are 46 years of age or older, have your blood pressure checked every year. You should have your blood pressure measured twice-once when you are at a hospital or clinic, and once when you are not at a hospital or clinic. Record the average of the two measurements. To check your blood pressure when you are not at a hospital or clinic, you can use: ? An automated blood pressure machine at a pharmacy. ? A home blood pressure monitor.  Talk to your health care provider about your target blood pressure.  If you are between 26-81 years old, ask your health care provider if you should take aspirin to prevent heart disease.  Have regular diabetes screenings by checking your fasting blood sugar level. ? If you are at a normal weight and have a low risk for diabetes, have this test once every three years after the age of 52. ? If you are overweight and have a high risk for diabetes, consider being tested at a younger age or more often.  A one-time screening for abdominal aortic aneurysm (AAA) by ultrasound is recommended for men aged 14-75 years who are current or former smokers. What should I know about preventing infection? Hepatitis B If you have a higher risk for hepatitis B, you should be screened for this virus. Talk with your health care provider to find out if you are at risk for hepatitis B infection. Hepatitis C Blood testing is recommended for:  Everyone born from [birth year] through 1963.  Anyone with known risk factors for hepatitis C.  Sexually Transmitted Diseases (STDs)  You should be screened each year for STDs  including gonorrhea and chlamydia if: ? You are sexually active and are younger than 82 years of age. ? You are older than 82 years of age and your health care provider tells you that you are at risk for this type of infection. ? Your sexual activity has changed since you were last screened and you are at an increased risk for chlamydia or gonorrhea. Ask your health care provider if you are at risk.  Talk with your health care provider about whether you are at high risk of being infected with HIV. Your health care provider may recommend a prescription medicine to help prevent HIV infection.  What else can I do?  Schedule regular health, dental, and eye exams.  Stay current with your vaccines (immunizations).  Do not use any tobacco products, such as cigarettes, chewing tobacco, and e-cigarettes. If you need help quitting, ask your health care provider.  Limit alcohol intake to no more than 2 drinks per day. One drink equals 12 ounces of beer, 5 ounces of wine, or 1 ounces of hard liquor.  Do not use street drugs.  Do not share needles.  Ask your health care provider for help if you need support or information about quitting drugs.  Tell your health care provider if you  often feel depressed.  Tell your health care provider if you have ever been abused or do not feel safe at home. This information is not intended to replace advice given to you by your health care provider. Make sure you discuss any questions you have with your health care provider. Document Released: 10/27/2007 Document Revised: 12/28/2015 Document Reviewed: 02/01/2015 Elsevier Interactive Patient Education  [employer].

## 2017-07-31 NOTE — Patient Instructions (Signed)
Follow up immediately for any fever or increasing shortness of breath.

## 2017-07-31 NOTE — Progress Notes (Addendum)
Subjective:   [name] is a 84 y.o. male who presents for Medicare Annual/Subsequent preventive examination.  Report health as fair PSA UR Dr. Jeffie Pollock  Diet Cooks meat Pork chops and ribs but other times vegetables and no meat at times Vegetables   BMI 32   Exercise  Does not exercise  Yard work  [employer] x 40  years  Wife plays with him as well     There are no preventive care reminders to display for this patient.      Objective:    Vitals: BP 110/80   Pulse 90   Ht 5\' 11"  (1.803 m)   Wt 222 lb (100.7 kg)   BMI 30.96 kg/m   Body mass index is 30.96 kg/m.  Advanced Directives 08/06/2013 04/14/2013 03/04/2013 02/27/2013  Does Patient Have a Medical Advance Directive? Patient does not have advance directive;Patient would not like information Patient does not have advance directive Patient does not have advance directive Patient does not have advance directive;Patient would not like information  Pre-existing out of facility DNR order (yellow form or pink MOST form) - No No -   Advanced Directive; Reviewed advanced directive and agreed to receipt of information and discussion.  Focused face to face x  20 minutes discussing HCPOA and Living will and reviewed all the questions in the [place] forms. The patient voices understanding of HCPOA; LW reviewed and information provided on each question. Educated on how to revoke this HCPOA or LW at any time.   Also  discussed life prolonging measures (given a few examples) and where he could choose to initiate or not;  the ability to given the HCPOA power to change his living will or not if he cannot speak for himself; as well as finalizing the will by 2 unrelated witnesses and notary.  Will call for questions and given information on [facility] pastoral department for further assistance.   The patient engaged in this and asked questions.  Explained the difference in normal vent application vs end of life ventilation.    Tobacco Social History   Tobacco Use  Smoking Status Current Some Day Smoker  . Packs/day: 0.50  . Years: 30.00  . Pack years: 15.00  . 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 Disturbances of sulphur-bearing amino-acid metabolism   . Malignant neoplasm of prostate ([place])   . Obstructive sleep apnea (adult) (pediatric) 2004   surgery to coorrect  . Osteoarthrosis, unspecified whether generalized or localized, unspecified site   . Other and unspecified hyperlipidemia   . Other specified cardiac dysrhythmias(427.89)   . 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 Paroxysmal atrial fibrillation (HCC)   . Unspecified cerebral artery occlusion with cerebral infarction   . Unspecified disorder resulting from impaired renal function   . Unspecified essential hypertension   . Wears glasses   . Wears partial dentures    upper partial   Past  Surgical History:  Procedure Laterality Date  . APPLICATION OF A-CELL OF EXTREMITY Right 08/12/2013   Procedure: APPLICATION OF A-CELL OF EXTREMITY;  Surgeon: Theodoro Kos, DO;  Location: [place];  Service: Plastics;  Laterality: Right;  . CHOLECYSTECTOMY  1987  . COLONOSCOPY    . ent surgery  2004   correct snoring  . INGUINAL HERNIA REPAIR     left  . INSERT / REPLACE / REMOVE PACEMAKER  04/14/2013   dual chamber   / Dr Sallyanne Kuster  . MINOR IRRIGATION AND DEBRIDEMENT OF WOUND Right 03/04/2013   Procedure: MINOR IRRIGATION AND DEBRIDEMENT OF WOUND;  Surgeon: Theodoro Kos, DO;  Location: [place];  Service: Plastics;  Laterality: Right;  . ORCHIECTOMY  2002  . PERMANENT PACEMAKER INSERTION N/A 04/14/2013   Procedure: PERMANENT PACEMAKER INSERTION;  Surgeon: [name], MD;  Location: [place] CATH LAB;  Service: Cardiovascular;  Laterality: N/A;  . SKIN SPLIT GRAFT Right 08/12/2013   Procedure: SKIN GRAFT SPLIT THICKNESS WITH PLACEMENT OF VAC TO RIGHT LOWER LEG/PLACEMENT OF ACELL TO RIGHT UPPER THIGH AREA (HARVEST SITE);  Surgeon: Theodoro Kos, DO;  Location: [place];  Service: Plastics;  Laterality: Right;   Family History  Problem Relation Age of Onset  . Prostate cancer Father    Social History   Socioeconomic History  . Marital status: Married    Spouse name: Consulting civil engineer  . Number of children: Not on file  . Years of education: Not on file  . Highest education level: Not on file  Social Needs  . Financial resource strain: Not on file  . Food insecurity - worry: Not on file  . Food insecurity - inability: Not on file  . Transportation needs - medical: Not on file  . Transportation needs - non-medical: Not on file  Occupational History  . Occupation: Retired  . Occupation: RETIRED    Employer: RETIRED  Tobacco Use  . Smoking status: Current Some Day Smoker    Packs/day: 0.50    Years: 30.00    Pack years: 15.00    Types: Cigarettes  . Smokeless tobacco: Never Used  . Tobacco comment:  pt stated he smokes 1 pack every 2 wks  Substance and Sexual Activity  . Alcohol use: No  . Drug use: No  . Sexual activity: Not on file    Comment: started smoking 2-3 cig daily  Other Topics Concern  . Not on file  Social History Narrative  . Not on file    Outpatient Encounter Medications as of 07/31/2017  Medication Sig  . albuterol (PROVENTIL HFA;VENTOLIN HFA) 108 (90 Base) MCG/ACT inhaler Inhale 2 puffs into the lungs every 6 (six) hours as needed for wheezing or shortness of breath.  Marland Kitchen amLODipine (NORVASC) 5 MG tablet TAKE 1 TABLET  EVERY DAY  . cefUROXime (CEFTIN) 250 MG tablet Take 1 tablet (250 mg total) by mouth 2 (two) times daily with a meal.  . clopidogrel (PLAVIX) 75 MG tablet TAKE 1 TABLET EVERY DAY WITH BREAKFAST  . losartan (COZAAR) 50 MG tablet TAKE 1 TABLET EVERY DAY  . metoprolol tartrate (LOPRESSOR) 25 MG tablet Take 1 tablet (25 mg total) by mouth 2 (two) times daily.  . Multiple Vitamin (MULTIVITAMIN WITH MINERALS) TABS tablet Take 1 tablet by mouth daily.  . predniSONE (DELTASONE) 10 MG tablet Taper as follows: 4-4-4-3-3-2-2  . simvastatin (ZOCOR) 40 MG tablet TAKE 1 TABLET EVERY DAY   No facility-administered encounter medications on file  as of 07/31/2017.     Activities of Daily Living In your present state of health, do you have any difficulty performing the following activities: 07/31/2017  Hearing? N  Vision? N  Some recent data might be hidden    Patient Care Team: [name], MD as PCP - General (Family Medicine)   Assessment:   This is a routine wellness examination for [place].  Exercise Activities and Dietary recommendations    Goals    . Quit Smoking     What are the options to eating;  One option would be to chew gum Another options would be computer         Fall Risk Fall Risk  07/31/2017 11/16/2016 05/17/2015 05/17/2015 11/11/2013  Falls in the past year? No No No No No   No falls Can do what he did last year Has stairs in home; but plans to move  Has been In home x 20 years    Timed Get Up and Go Performed: slow but able; states knees do hurt but does not want to see MD for this or take injections  Depression Screen PHQ 2/9 Scores 07/31/2017 11/16/2016 05/17/2015 11/11/2013  PHQ - 2 Score 0 0 0 0   Denies  Cognitive Function     Ad8 score reviewed for issues:  Issues making decisions:  Less interest in hobbies / activities:  Repeats questions, stories (family complaining):  Trouble using ordinary gadgets (microwave, computer, phone):  Forgets the month or  year:   Mismanaging finances:   Remembering appts:  Daily problems with thinking and/or memory: Ad8 score is= 0     Immunization History  Administered Date(s) Administered  . Influenza Split 03/22/2011, 04/01/2012  . Influenza Whole 02/18/2009, 04/13/2010  . Influenza, High Dose Seasonal PF 05/10/2014, 05/17/2015, 03/28/2016, 03/20/2017  . Influenza,inj,Quad PF,6+ Mos 02/16/2013  . Pneumococcal Conjugate-13 11/11/2013  . Pneumococcal Polysaccharide-23 05/18/2016  . Tdap 11/22/2011    Qualifies for Shingles Vaccine? Educated   Screening Tests Health Maintenance  Topic Date Due  . Samul Dada  11/21/2021  . INFLUENZA VACCINE  Completed  . PNA vac Low Risk Adult  Completed    Plan:      PCP Notes   Health Maintenance There are no preventive care reminders to display for this patient. Seeing Dr. Jeffie Pollock for UR fup   Discussed walking with wife. Wife walks to fast; agreed she would walk fast and then come back to get him for warm down. Or they walk together at a track where he can go at his own pace.  Coached regarding smoking cessation States he would eat to much if he stopped; helped him to realize other options. Pre-motivation currently   Abnormal Screens  None noted  Referrals  No; c/o of pain in knees but declined referral   Patient concerns; None verbalized   Nurse Concerns; As noted  Next PCP apt Seen Dr. Elease Hashimoto today      I have personally reviewed and noted the following in the patient's chart:   . Medical and social history . Use of alcohol, tobacco or illicit drugs  . Current medications and supplements . Functional ability and status . Nutritional status . Physical activity . Advanced directives . List of other physicians . Hospitalizations, surgeries, and ER visits in previous 12 months . Vitals . Screenings to include cognitive, depression, and falls . Referrals and appointments  In addition, I have reviewed and discussed  with patient certain preventive protocols, quality metrics, and best  practice recommendations. A written personalized care plan for preventive services as well as general preventive health recommendations were provided to patient.     BZXYD,SWVTV, RN  07/31/2017  Agree with assessment as above.  [name] MD [facility]

## 2017-07-31 NOTE — Progress Notes (Signed)
Subjective:     Patient ID: [name], male   DOB: 14-Sep-1932, 84 y.o.   MRN: [id number]  HPI Patient has long history of smoking. He seen with cough for the past 3 weeks. He is here with his wife today. Apparently had recent recurrence of elevated PSA and urologist is trying to figure out their next steps.  Cough productive of clear sputum. No hemoptysis. No fevers or chills. Occasional wheezing. Use albuterol as needed. Still smokes. Denies any significant nasal congestion. No nausea, vomiting, or diarrhea. No dyspnea at rest. Mild dyspnea with activity.  Pt also to get Medicare AWV today.  Past Medical History:  Diagnosis Date  . Disturbances of sulphur-bearing amino-acid metabolism   . Malignant neoplasm of prostate ([place])   . Obstructive sleep apnea (adult) (pediatric) 2004   surgery to coorrect  . Osteoarthrosis, unspecified whether generalized or localized, unspecified site   . Other and unspecified hyperlipidemia   . Other specified cardiac dysrhythmias(427.89)   . 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 Paroxysmal atrial fibrillation (HCC)   . Unspecified cerebral artery occlusion with cerebral infarction   . Unspecified disorder resulting from impaired renal function   . Unspecified essential hypertension   . Wears glasses   . Wears partial dentures    upper partial   Past Surgical History:  Procedure Laterality Date  . APPLICATION OF A-CELL OF EXTREMITY Right 08/12/2013   Procedure: APPLICATION OF A-CELL OF EXTREMITY;  Surgeon: Theodoro Kos, DO;  Location: [place];  Service: Plastics;  Laterality: Right;  . CHOLECYSTECTOMY  1987  . COLONOSCOPY    . ent surgery  2004   correct snoring  . INGUINAL HERNIA REPAIR     left  . INSERT / REPLACE / REMOVE PACEMAKER  04/14/2013   dual chamber   / Dr Sallyanne Kuster  . MINOR IRRIGATION AND DEBRIDEMENT OF WOUND Right 03/04/2013   Procedure: MINOR IRRIGATION AND DEBRIDEMENT OF WOUND;  Surgeon: Theodoro Kos, DO;  Location: [place];  Service: Plastics;  Laterality: Right;  . ORCHIECTOMY  2002  . PERMANENT PACEMAKER INSERTION N/A 04/14/2013   Procedure: PERMANENT PACEMAKER INSERTION;  Surgeon: [name], MD;  Location: [place] CATH LAB;  Service: Cardiovascular;  Laterality: N/A;  . SKIN SPLIT GRAFT Right 08/12/2013   Procedure: SKIN GRAFT SPLIT THICKNESS WITH PLACEMENT OF VAC TO RIGHT LOWER LEG/PLACEMENT OF ACELL TO RIGHT UPPER THIGH AREA (HARVEST SITE);  Surgeon: Theodoro Kos, DO;  Location: [place];  Service: Plastics;  Laterality: Right;    reports that he has been smoking cigarettes.  He has a 15.00 pack-year smoking history. he has never used smokeless tobacco. He reports that he does not drink alcohol or use drugs. family history includes Prostate cancer in his father. No Known Allergies   Review of Systems  Constitutional: Negative for chills and fever.  Respiratory: Positive for cough, shortness of breath and wheezing.   Cardiovascular: Negative for chest pain and palpitations.  Gastrointestinal: Negative for nausea and vomiting.       Objective:   Physical Exam  Constitutional: He appears well-developed and well-nourished.  HENT:  Right Ear: External ear normal.  Left Ear: External ear normal.  Mouth/Throat: Oropharynx is clear and moist.  Neck: Neck supple.  Cardiovascular: Normal rate.  Pulmonary/Chest: Effort normal.  Slightly diminished breath sounds throughout. No wheezes. No rales. Pulse oximetry 96%  Neurological: He is alert.       Assessment:  Cough. Patient has high risk for complication with long-standing nicotine use and probable COPD. Currently no respiratory distress    Plan:     -Go and cover with Ceftin 250 mg twice daily for 7 days -Prednisone taper over the next week -Refill albuterol for as needed use  [name] MD [facility] at [facility]

## 2017-09-02 DIAGNOSIS — C61 Malignant neoplasm of prostate: Secondary | ICD-10-CM | POA: Diagnosis not present

## 2017-09-16 ENCOUNTER — Ambulatory Visit (INDEPENDENT_AMBULATORY_CARE_PROVIDER_SITE_OTHER): Payer: Medicare HMO | Admitting: *Deleted

## 2017-09-16 DIAGNOSIS — I495 Sick sinus syndrome: Secondary | ICD-10-CM | POA: Diagnosis not present

## 2017-09-17 ENCOUNTER — Telehealth: Payer: Self-pay | Admitting: Cardiology

## 2017-09-17 NOTE — Telephone Encounter (Signed)
LMOVM reminding pt to send remote transmission.   

## 2017-09-18 ENCOUNTER — Encounter: Payer: Self-pay | Admitting: Cardiology

## 2017-09-18 DIAGNOSIS — C61 Malignant neoplasm of prostate: Secondary | ICD-10-CM | POA: Diagnosis not present

## 2017-09-18 NOTE — Progress Notes (Signed)
Remote pacemaker transmission.   

## 2017-09-20 ENCOUNTER — Other Ambulatory Visit: Payer: Self-pay | Admitting: Family Medicine

## 2017-10-04 LAB — CUP PACEART REMOTE DEVICE CHECK
Battery Remaining Longevity: 55 mo
Battery Voltage: 2.99 V
Brady Statistic AP VP Percent: 24.43 %
Brady Statistic RA Percent Paced: 96.52 %
Brady Statistic RV Percent Paced: 24.46 %
Date Time Interrogation Session: 20190507180126
Implantable Lead Implant Date: 20141202
Implantable Lead Implant Date: 20141202
Implantable Lead Location: 753860
Implantable Lead Model: 5076
Implantable Pulse Generator Implant Date: 20141202
Lead Channel Impedance Value: 361 Ohm
Lead Channel Impedance Value: 456 Ohm
Lead Channel Pacing Threshold Amplitude: 0.625 V
Lead Channel Pacing Threshold Pulse Width: 0.4 ms
Lead Channel Sensing Intrinsic Amplitude: 2.25 mV
Lead Channel Setting Pacing Amplitude: 2 V
Lead Channel Setting Sensing Sensitivity: 0.9 mV
MDC IDC LEAD LOCATION: 753859
MDC IDC MSMT LEADCHNL RA IMPEDANCE VALUE: 342 Ohm
MDC IDC MSMT LEADCHNL RA IMPEDANCE VALUE: 418 Ohm
MDC IDC MSMT LEADCHNL RA SENSING INTR AMPL: 2.25 mV
MDC IDC MSMT LEADCHNL RV PACING THRESHOLD AMPLITUDE: 1.625 V
MDC IDC MSMT LEADCHNL RV PACING THRESHOLD PULSEWIDTH: 0.4 ms
MDC IDC MSMT LEADCHNL RV SENSING INTR AMPL: 16.125 mV
MDC IDC MSMT LEADCHNL RV SENSING INTR AMPL: 16.125 mV
MDC IDC SET LEADCHNL RV PACING AMPLITUDE: 3.25 V
MDC IDC SET LEADCHNL RV PACING PULSEWIDTH: 0.4 ms
MDC IDC STAT BRADY AP VS PERCENT: 73.11 %
MDC IDC STAT BRADY AS VP PERCENT: 0.15 %
MDC IDC STAT BRADY AS VS PERCENT: 2.3 %

## 2017-10-16 DIAGNOSIS — C775 Secondary and unspecified malignant neoplasm of intrapelvic lymph nodes: Secondary | ICD-10-CM | POA: Diagnosis not present

## 2017-10-23 DIAGNOSIS — Z192 Hormone resistant malignancy status: Secondary | ICD-10-CM | POA: Diagnosis not present

## 2017-10-23 DIAGNOSIS — C7951 Secondary malignant neoplasm of bone: Secondary | ICD-10-CM | POA: Diagnosis not present

## 2017-10-23 DIAGNOSIS — N390 Urinary tract infection, site not specified: Secondary | ICD-10-CM | POA: Diagnosis not present

## 2017-10-23 DIAGNOSIS — C775 Secondary and unspecified malignant neoplasm of intrapelvic lymph nodes: Secondary | ICD-10-CM | POA: Diagnosis not present

## 2017-10-23 DIAGNOSIS — N183 Chronic kidney disease, stage 3 (moderate): Secondary | ICD-10-CM | POA: Diagnosis not present

## 2017-10-23 DIAGNOSIS — C61 Malignant neoplasm of prostate: Secondary | ICD-10-CM | POA: Diagnosis not present

## 2017-11-18 ENCOUNTER — Ambulatory Visit: Payer: Medicare HMO | Admitting: Family Medicine

## 2017-11-18 DIAGNOSIS — Z0289 Encounter for other administrative examinations: Secondary | ICD-10-CM

## 2017-11-21 DIAGNOSIS — N183 Chronic kidney disease, stage 3 (moderate): Secondary | ICD-10-CM | POA: Diagnosis not present

## 2017-11-25 ENCOUNTER — Other Ambulatory Visit: Payer: Self-pay | Admitting: Family Medicine

## 2017-11-27 ENCOUNTER — Encounter: Payer: Self-pay | Admitting: Family Medicine

## 2017-11-27 ENCOUNTER — Ambulatory Visit (INDEPENDENT_AMBULATORY_CARE_PROVIDER_SITE_OTHER): Payer: Medicare HMO | Admitting: Family Medicine

## 2017-11-27 VITALS — BP 110/70 | HR 72 | Temp 98.3°F | Wt 214.1 lb

## 2017-11-27 DIAGNOSIS — R600 Localized edema: Secondary | ICD-10-CM

## 2017-11-27 DIAGNOSIS — N183 Chronic kidney disease, stage 3 unspecified: Secondary | ICD-10-CM

## 2017-11-27 DIAGNOSIS — C61 Malignant neoplasm of prostate: Secondary | ICD-10-CM

## 2017-11-27 DIAGNOSIS — R634 Abnormal weight loss: Secondary | ICD-10-CM

## 2017-11-27 DIAGNOSIS — I10 Essential (primary) hypertension: Secondary | ICD-10-CM

## 2017-11-27 NOTE — Progress Notes (Signed)
Subjective:     Patient ID: [name], male   DOB: [birth year], 84 y.o.   MRN: [id number]  HPI Patient seen with some pain and swelling involving predominantly right foot and hand but to some extent left foot and hand as well.. Symptoms present for at least a few weeks.  Medical history significant for hypertension, history of CVA, history of atrial fibrillation, obstructive sleep apnea, metastatic prostate cancer, chronic kidney disease stage III, hyperlipidemia, ongoing nicotine use, chronic anticoagulation  Read PET scan in February which showed probable small right pelvic metastatic lymph node in addition to evidence for skeletal metastasis left inferior pubic ramus and neural arch of T12.  Patient was started around that time on Xtandi.  He's had some general increased musculoskeletal complaints since then with gradual increase in edema. Foot edema seems to be better in the mornings when he first gets up but hand edema seems to be worse. Denies any dyspnea. No orthopnea. Baseline GFR around 44.  His weight is actually down about 8 pounds from March. Wife has noted his appetite has declined. Chronic medications unchanged. No recent nonsteroidal use. Using Tylenol for his hand and foot pain without improvement.  Past Medical History:  Diagnosis Date  . Disturbances of sulphur-bearing amino-acid metabolism   . Malignant neoplasm of prostate ([place])   . Obstructive sleep apnea (adult) (pediatric) 2004   surgery to coorrect  . Osteoarthrosis, unspecified whether generalized or localized, unspecified site   . Other and unspecified hyperlipidemia   . Other specified cardiac dysrhythmias(427.89)   . 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 Paroxysmal atrial fibrillation (HCC)   . Unspecified cerebral artery occlusion with cerebral infarction   . Unspecified disorder resulting from impaired renal function   . Unspecified essential hypertension   . Wears glasses   . Wears  partial dentures    upper partial   Past Surgical History:  Procedure Laterality Date  . APPLICATION OF A-CELL OF EXTREMITY Right 08/12/2013   Procedure: APPLICATION OF A-CELL OF EXTREMITY;  Surgeon: Theodoro Kos, DO;  Location: [place];  Service: Plastics;  Laterality: Right;  . CHOLECYSTECTOMY  1987  . COLONOSCOPY    . ent surgery  2004   correct snoring  . INGUINAL HERNIA REPAIR     left  . INSERT / REPLACE / REMOVE PACEMAKER  04/14/2013   dual chamber   / Dr Sallyanne Kuster  . MINOR IRRIGATION AND DEBRIDEMENT OF WOUND Right 03/04/2013   Procedure: MINOR IRRIGATION AND DEBRIDEMENT OF WOUND;  Surgeon: Theodoro Kos, DO;  Location: [place];  Service: Plastics;  Laterality: Right;  . ORCHIECTOMY  2002  . PERMANENT PACEMAKER INSERTION N/A 04/14/2013   Procedure: PERMANENT PACEMAKER INSERTION;  Surgeon: [name], MD;  Location: [place] CATH LAB;  Service: Cardiovascular;  Laterality: N/A;  . SKIN SPLIT GRAFT Right 08/12/2013   Procedure: SKIN GRAFT SPLIT THICKNESS WITH PLACEMENT OF VAC TO RIGHT LOWER LEG/PLACEMENT OF ACELL TO RIGHT UPPER THIGH AREA (HARVEST SITE);  Surgeon: Theodoro Kos, DO;  Location: [place];  Service: Plastics;  Laterality: Right;    reports that he has been smoking cigarettes.  He has a 15.00 pack-year smoking history. He has never used smokeless tobacco. He reports that he does not drink alcohol or use drugs. family history includes Prostate cancer in his father. No Known Allergies    Review of Systems  Constitutional: Positive for appetite change. Negative for chills and fever.  Respiratory: Negative for  cough and shortness of breath.   Cardiovascular: Positive for leg swelling. Negative for chest pain.  Gastrointestinal: Negative for abdominal pain and blood in stool.  Genitourinary: Negative for dysuria.  Musculoskeletal: Positive for arthralgias.  Skin: Negative for rash.  Neurological: Negative for dizziness.   Hematological: Negative for adenopathy.       Objective:   Physical Exam  Constitutional: He is oriented to person, place, and time. He appears well-developed and well-nourished.  Cardiovascular: Normal rate and regular rhythm.  Pulmonary/Chest: Effort normal and breath sounds normal.  Musculoskeletal: He exhibits edema.  Patient has some edema in both hands and both feet as well as lower legs. Right side slightly greater than left. No skin rash. No warmth. No erythema. No bony tenderness.  Neurological: He is alert and oriented to person, place, and time.       Assessment:     #1 Patient has metastatic prostate cancer with recent initiation of Xtandi   #2 increased hand and foot edema possibly related to #1 (? Side effect from [place])  #3 history of atrial fibrillation  #4 hypertension stable and at goal  #5 chronic kidney disease  #6 weight loss probably related to #1    Plan:     -Check further labs with CBC, comprehensive metabolic panel, TSH -Schedule follow-up in one week to reassess. -May need to consult with neurology whether to continue with Stacey Drain MD [facility]

## 2017-11-28 LAB — CBC WITH DIFFERENTIAL/PLATELET
BASOS PCT: 1.1 % (ref 0.0–3.0)
Basophils Absolute: 0.1 10*3/uL (ref 0.0–0.1)
EOS PCT: 2.1 % (ref 0.0–5.0)
Eosinophils Absolute: 0.2 10*3/uL (ref 0.0–0.7)
HCT: 38.1 % — ABNORMAL LOW (ref 39.0–52.0)
Hemoglobin: 12.6 g/dL — ABNORMAL LOW (ref 13.0–17.0)
Lymphocytes Relative: 24.5 % (ref 12.0–46.0)
Lymphs Abs: 2.5 10*3/uL (ref 0.7–4.0)
MCHC: 33.1 g/dL (ref 30.0–36.0)
MCV: 88.7 fl (ref 78.0–100.0)
MONO ABS: 0.9 10*3/uL (ref 0.1–1.0)
Monocytes Relative: 8.4 % (ref 3.0–12.0)
NEUTROS ABS: 6.5 10*3/uL (ref 1.4–7.7)
NEUTROS PCT: 63.9 % (ref 43.0–77.0)
PLATELETS: 265 10*3/uL (ref 150.0–400.0)
RBC: 4.29 Mil/uL (ref 4.22–5.81)
RDW: 16.1 % — AB (ref 11.5–15.5)
WBC: 10.2 10*3/uL (ref 4.0–10.5)

## 2017-11-28 LAB — COMPREHENSIVE METABOLIC PANEL
ALT: 11 U/L (ref 0–53)
AST: 16 U/L (ref 0–37)
Albumin: 3.6 g/dL (ref 3.5–5.2)
Alkaline Phosphatase: 118 U/L — ABNORMAL HIGH (ref 39–117)
BUN: 24 mg/dL — ABNORMAL HIGH (ref 6–23)
CO2: 24 meq/L (ref 19–32)
Calcium: 10.7 mg/dL — ABNORMAL HIGH (ref 8.4–10.5)
Chloride: 110 mEq/L (ref 96–112)
Creatinine, Ser: 2 mg/dL — ABNORMAL HIGH (ref 0.40–1.50)
GFR: 41.27 mL/min — AB (ref >60.00)
GLUCOSE: 104 mg/dL — AB (ref 70–99)
POTASSIUM: 4.1 meq/L (ref 3.5–5.1)
Sodium: 142 mEq/L (ref 135–145)
Total Bilirubin: 0.3 mg/dL (ref 0.2–1.2)
Total Protein: 7.1 g/dL (ref 6.0–8.3)

## 2017-11-28 LAB — TSH: TSH: 1.99 u[IU]/mL (ref 0.35–4.50)

## 2017-12-04 ENCOUNTER — Encounter: Payer: Self-pay | Admitting: Family Medicine

## 2017-12-04 ENCOUNTER — Ambulatory Visit (INDEPENDENT_AMBULATORY_CARE_PROVIDER_SITE_OTHER): Payer: Medicare HMO | Admitting: Family Medicine

## 2017-12-04 DIAGNOSIS — R609 Edema, unspecified: Secondary | ICD-10-CM

## 2017-12-04 DIAGNOSIS — M2550 Pain in unspecified joint: Secondary | ICD-10-CM

## 2017-12-04 LAB — SEDIMENTATION RATE: Sed Rate: 112 mm/hr — ABNORMAL HIGH (ref 0–20)

## 2017-12-04 NOTE — Progress Notes (Signed)
Subjective:     Patient ID: [name], male   DOB: August 07, 1932, 84 y.o.   MRN: [id number]  HPI Patient was seen last week with some increased swelling and pain mostly involving the right hand and wrist but also the feet to some extent. He states he had this for a few weeks. He has history of known metastatic prostate cancer as per previous note with involvement of the left inferior pubic ramus and neural arch of T12. He was started around March on [place] can be associated with edema and musculoskeletal complaints  He has multiple other chronic problems including hypertension, history of CVA, atrial fibrillation, obstructive sleep apnea, chronic kidney disease, hyperlipidemia, ongoing nicotine use  Rechecked several labs last visit and these came back significant for corrected calcium of 11.1. We've asked that he return today for follow-up regarding getting intact PTH level. His TSH was normal  Pain and swelling unchanged. He's having pain mostly in the hands right hand/wrist greater than left as well as right shoulder pain and left knee pain. No history of inflammatory arthritis  Past Medical History:  Diagnosis Date  . Disturbances of sulphur-bearing amino-acid metabolism   . Malignant neoplasm of prostate ([place])   . Obstructive sleep apnea (adult) (pediatric) 2004   surgery to coorrect  . Osteoarthrosis, unspecified whether generalized or localized, unspecified site   . Other and unspecified hyperlipidemia   . Other specified cardiac dysrhythmias(427.89)   . 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 Paroxysmal atrial fibrillation (HCC)   . Unspecified cerebral artery occlusion with cerebral infarction   . Unspecified disorder resulting from impaired renal function   . Unspecified essential hypertension   . Wears glasses   . Wears partial dentures    upper partial   Past Surgical History:  Procedure Laterality Date  . APPLICATION OF A-CELL OF EXTREMITY Right  08/12/2013   Procedure: APPLICATION OF A-CELL OF EXTREMITY;  Surgeon: Theodoro Kos, DO;  Location: [place];  Service: Plastics;  Laterality: Right;  . CHOLECYSTECTOMY  1987  . COLONOSCOPY    . ent surgery  2004   correct snoring  . INGUINAL HERNIA REPAIR     left  . INSERT / REPLACE / REMOVE PACEMAKER  04/14/2013   dual chamber   / Dr Sallyanne Kuster  . MINOR IRRIGATION AND DEBRIDEMENT OF WOUND Right 03/04/2013   Procedure: MINOR IRRIGATION AND DEBRIDEMENT OF WOUND;  Surgeon: Theodoro Kos, DO;  Location: [place];  Service: Plastics;  Laterality: Right;  . ORCHIECTOMY  2002  . PERMANENT PACEMAKER INSERTION N/A 04/14/2013   Procedure: PERMANENT PACEMAKER INSERTION;  Surgeon: [name], MD;  Location: [place] CATH LAB;  Service: Cardiovascular;  Laterality: N/A;  . SKIN SPLIT GRAFT Right 08/12/2013   Procedure: SKIN GRAFT SPLIT THICKNESS WITH PLACEMENT OF VAC TO RIGHT LOWER LEG/PLACEMENT OF ACELL TO RIGHT UPPER THIGH AREA (HARVEST SITE);  Surgeon: Theodoro Kos, DO;  Location: [place];  Service: Plastics;  Laterality: Right;    reports that he has been smoking cigarettes.  He has a 15.00 pack-year smoking history. He has never used smokeless tobacco. He reports that he does not drink alcohol or use drugs. family history includes Prostate cancer in his father. No Known Allergies   Review of Systems  Constitutional: Positive for fatigue. Negative for appetite change, chills, fever and unexpected weight change.  Respiratory: Negative for cough.   Cardiovascular: Positive for leg swelling. Negative for chest pain and palpitations.  Gastrointestinal: Negative for abdominal pain, nausea and vomiting.  Musculoskeletal: Positive for arthralgias.  Neurological: Negative for dizziness and headaches.       Objective:   Physical Exam  Constitutional: He appears well-developed and well-nourished.  Cardiovascular: Normal rate.  Pulmonary/Chest: Effort  normal.  Somewhat diminished breath sounds throughout but relatively clear  Musculoskeletal: He exhibits edema.  Patient has some diffuse edema and swelling involving hands wrist ankles feet lower legs bilaterally No warmth of his joints. He has some tenderness diffusely lower in the right hand and wrist       Assessment:     #1 hypercalcemia. We explained this could be related to his prostate cancer. Rule out other etiologies such as primary hyperparathyroidism, multiple myeloma.  Recent thyroid normal. Does not take any calcium supplements.  No thiazide use  #2 increased extremity edema and pain. Rule out inflammatory arthritis.  Also recent initiation of Xtandi which can apparently call some edema issues  #3 metastatic prostate cancer    Plan:     -Check intact PTH along with UPEP and SPEP -Check rheumatoid factor, ANA, CCP antibody, sedimentation rate -Avoid nonsteroidals with his chronic kidney disease and age  [name] MD [facility] at [facility]

## 2017-12-06 LAB — PTH, INTACT AND CALCIUM
Calcium: 10.9 mg/dL — ABNORMAL HIGH (ref 8.6–10.3)
PTH: 70 pg/mL — AB (ref 14–64)

## 2017-12-06 LAB — ANA: Anti Nuclear Antibody(ANA): NEGATIVE

## 2017-12-06 LAB — RHEUMATOID FACTOR: Rhuematoid fact SerPl-aCnc: <14 IU/mL (ref <14)

## 2017-12-06 LAB — CYCLIC CITRUL PEPTIDE ANTIBODY, IGG: Cyclic Citrullin Peptide Ab: <16 UNITS

## 2017-12-09 ENCOUNTER — Other Ambulatory Visit: Payer: Self-pay | Admitting: Family Medicine

## 2017-12-09 LAB — IMMUNOFIXATION ELECTROPHORESIS
IGG (IMMUNOGLOBIN G), SERUM: 1231 mg/dL (ref 600–1540)
IGM, SERUM: 46 mg/dL — AB (ref 50–300)
IMMUNOFIX ELECTR INT: NOT DETECTED
IMMUNOGLOBULIN A: 141 mg/dL (ref 20–320)

## 2017-12-09 LAB — PROTEIN ELECTROPHORESIS, SERUM
ALBUMIN ELP: 3.5 g/dL — AB (ref 3.8–4.8)
ALPHA 1: 0.4 g/dL — AB (ref 0.2–0.3)
ALPHA 2: 0.9 g/dL (ref 0.5–0.9)
Beta 2: 0.4 g/dL (ref 0.2–0.5)
Beta Globulin: 0.5 g/dL (ref 0.4–0.6)
Gamma Globulin: 1.1 g/dL (ref 0.8–1.7)
TOTAL PROTEIN: 6.8 g/dL (ref 6.1–8.1)

## 2017-12-09 MED ORDER — PREDNISONE 20 MG PO TABS: 20.0000 mg | ORAL_TABLET | Freq: Every day | ORAL | 0 refills | Status: DC

## 2017-12-16 ENCOUNTER — Encounter: Payer: Medicare HMO | Admitting: *Deleted

## 2017-12-17 ENCOUNTER — Encounter: Payer: Self-pay | Admitting: Family Medicine

## 2017-12-17 ENCOUNTER — Encounter: Payer: Self-pay | Admitting: *Deleted

## 2017-12-17 ENCOUNTER — Ambulatory Visit (INDEPENDENT_AMBULATORY_CARE_PROVIDER_SITE_OTHER): Payer: Medicare HMO | Admitting: Family Medicine

## 2017-12-17 VITALS — BP 120/88 | HR 81 | Temp 97.8°F | Wt 213.4 lb

## 2017-12-17 DIAGNOSIS — C61 Malignant neoplasm of prostate: Secondary | ICD-10-CM | POA: Diagnosis not present

## 2017-12-17 DIAGNOSIS — M2550 Pain in unspecified joint: Secondary | ICD-10-CM

## 2017-12-17 LAB — SEDIMENTATION RATE: SED RATE: 65 mm/h — AB (ref 0–20)

## 2017-12-17 MED ORDER — ALBUTEROL SULFATE HFA 108 (90 BASE) MCG/ACT IN AERS: 2.0000 | INHALATION_SPRAY | Freq: Four times a day (QID) | RESPIRATORY_TRACT | 3 refills | Status: DC | PRN

## 2017-12-17 NOTE — Progress Notes (Signed)
Subjective:     Patient ID: [name], male   DOB: [birth year], 84 y.o.   MRN: [id number]  HPI Patient with multiple chronic problems including hypertension, history of CVA, atrial fibrillation, obstructive sleep apnea, chronic kidney disease, hyperlipidemia, ongoing nicotine use, and metastatic prostate cancer.  Seen recently with increased swelling involving hands and feet. Metastatic prostate cancer with PET scan February 2019 showing bone involvement left inferior pubic ramus and neural arch of T12. He was started in March on [place] which can cause edema and musculoskeletal complaints.  Recent hypercalcemia with corrected calcium 11.1. We added PTH which came back slightly high 70. TSH normal. Serum protein electrophoresis showed nonspecific inflammatory pattern.  We obtained other labs including sedimentation rate which was 112, rheumatoid factor, CCP antibody, antinuclear antibody which were all normal. He was treated with low-dose prednisone 20 mg daily for 7 days and did notice some improvement in hand edema and also less pain. Low clinical suspicion for polymyalgia.  Past Medical History:  Diagnosis Date  . Disturbances of sulphur-bearing amino-acid metabolism   . Malignant neoplasm of prostate ([place])   . Obstructive sleep apnea (adult) (pediatric) 2004   surgery to coorrect  . Osteoarthrosis, unspecified whether generalized or localized, unspecified site   . Other and unspecified hyperlipidemia   . Other specified cardiac dysrhythmias(427.89)   . 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 Paroxysmal atrial fibrillation (HCC)   . Unspecified cerebral artery occlusion with cerebral infarction   . Unspecified disorder resulting from impaired renal function   . Unspecified essential hypertension   . Wears glasses   . Wears partial dentures    upper partial   Past Surgical History:  Procedure Laterality Date  . APPLICATION OF A-CELL OF EXTREMITY Right 08/12/2013   Procedure: APPLICATION OF A-CELL OF EXTREMITY;  Surgeon: Theodoro Kos, DO;  Location: [place];  Service: Plastics;  Laterality: Right;  . CHOLECYSTECTOMY  1987  . COLONOSCOPY    . ent surgery  2004   correct snoring  . INGUINAL HERNIA REPAIR     left  . INSERT / REPLACE / REMOVE PACEMAKER  04/14/2013   dual chamber   / Dr Sallyanne Kuster  . MINOR IRRIGATION AND DEBRIDEMENT OF WOUND Right 03/04/2013   Procedure: MINOR IRRIGATION AND DEBRIDEMENT OF WOUND;  Surgeon: Theodoro Kos, DO;  Location: [place];  Service: Plastics;  Laterality: Right;  . ORCHIECTOMY  2002  . PERMANENT PACEMAKER INSERTION N/A 04/14/2013   Procedure: PERMANENT PACEMAKER INSERTION;  Surgeon: [name], MD;  Location: [place] CATH LAB;  Service: Cardiovascular;  Laterality: N/A;  . SKIN SPLIT GRAFT Right 08/12/2013   Procedure: SKIN GRAFT SPLIT THICKNESS WITH PLACEMENT OF VAC TO RIGHT LOWER LEG/PLACEMENT OF ACELL TO RIGHT UPPER THIGH AREA (HARVEST SITE);  Surgeon: Theodoro Kos, DO;  Location: [place];  Service: Plastics;  Laterality: Right;    reports that he has been smoking cigarettes.  He has a 15.00 pack-year smoking history. He has never used smokeless tobacco. He reports that he does not drink alcohol or use drugs. family history includes Prostate cancer in his father. No Known Allergies   Review of Systems  Constitutional: Negative for appetite change, chills, fever and unexpected weight change.  Respiratory: Negative for shortness of breath.   Cardiovascular: Negative for chest pain.  Musculoskeletal: Positive for arthralgias.       Objective:   Physical Exam  Constitutional: He appears well-developed and well-nourished.  Cardiovascular: Normal rate.  Pulmonary/Chest: Effort normal and breath sounds normal.  Musculoskeletal: He exhibits edema.  Patient still has some edema feet lower legs bilaterally. Left hand edema       Assessment:     #1 metastatic  prostate cancer  #2 hypercalcemia. He does have mildly elevated intact PTH and also history of cancer as above and would consider that both of these are likely contributing. Fortunately, at this point calcium levels are only mildly elevated  #3 arthralgias. Question is how much of this is due to his Xtandi versus acute inflammatory arthritis.      Plan:     -Repeat sedimentation rate. -Consider rheumatology referral for any recurrent/worsening arthralgias -We've asked that he notify his urologist regarding his edema issues to see if they have any thoughts regarding whether this could be related to his Xtandi -Repeat basic metabolic panel within 2 months  [name] MD [place] at [facility]

## 2017-12-17 NOTE — Patient Instructions (Signed)
Let me know if joint pain and swelling increases after coming off the prednisone.

## 2017-12-24 ENCOUNTER — Encounter: Payer: Self-pay | Admitting: Cardiology

## 2018-01-01 ENCOUNTER — Ambulatory Visit (INDEPENDENT_AMBULATORY_CARE_PROVIDER_SITE_OTHER): Payer: Medicare HMO | Admitting: *Deleted

## 2018-01-01 DIAGNOSIS — I495 Sick sinus syndrome: Secondary | ICD-10-CM

## 2018-01-01 NOTE — Progress Notes (Signed)
Remote pacemaker transmission.   

## 2018-01-03 ENCOUNTER — Encounter: Payer: Self-pay | Admitting: Cardiology

## 2018-01-16 DIAGNOSIS — C61 Malignant neoplasm of prostate: Secondary | ICD-10-CM | POA: Diagnosis not present

## 2018-01-16 DIAGNOSIS — N183 Chronic kidney disease, stage 3 (moderate): Secondary | ICD-10-CM | POA: Diagnosis not present

## 2018-01-23 DIAGNOSIS — N183 Chronic kidney disease, stage 3 (moderate): Secondary | ICD-10-CM | POA: Diagnosis not present

## 2018-01-23 DIAGNOSIS — C61 Malignant neoplasm of prostate: Secondary | ICD-10-CM | POA: Diagnosis not present

## 2018-01-23 DIAGNOSIS — C775 Secondary and unspecified malignant neoplasm of intrapelvic lymph nodes: Secondary | ICD-10-CM | POA: Diagnosis not present

## 2018-02-04 LAB — CUP PACEART REMOTE DEVICE CHECK
Battery Remaining Longevity: 57 mo
Battery Voltage: 2.99 V
Brady Statistic AP VP Percent: 11.68 %
Brady Statistic AS VP Percent: 0.13 %
Brady Statistic RA Percent Paced: 95.44 %
Implantable Lead Implant Date: 20141202
Implantable Lead Location: 753860
Implantable Lead Model: 5076
Implantable Lead Model: 5076
Implantable Pulse Generator Implant Date: 20141202
Lead Channel Impedance Value: 361 Ohm
Lead Channel Impedance Value: 475 Ohm
Lead Channel Pacing Threshold Amplitude: 0.625 V
Lead Channel Pacing Threshold Pulse Width: 0.4 ms
Lead Channel Sensing Intrinsic Amplitude: 14.875 mV
Lead Channel Sensing Intrinsic Amplitude: 3.875 mV
Lead Channel Setting Pacing Amplitude: 2 V
Lead Channel Setting Pacing Pulse Width: 0.4 ms
Lead Channel Setting Sensing Sensitivity: 0.9 mV
MDC IDC LEAD IMPLANT DT: 20141202
MDC IDC LEAD LOCATION: 753859
MDC IDC MSMT LEADCHNL RA IMPEDANCE VALUE: 342 Ohm
MDC IDC MSMT LEADCHNL RA IMPEDANCE VALUE: 399 Ohm
MDC IDC MSMT LEADCHNL RA SENSING INTR AMPL: 3.875 mV
MDC IDC MSMT LEADCHNL RV PACING THRESHOLD AMPLITUDE: 1.375 V
MDC IDC MSMT LEADCHNL RV PACING THRESHOLD PULSEWIDTH: 0.4 ms
MDC IDC MSMT LEADCHNL RV SENSING INTR AMPL: 14.875 mV
MDC IDC SESS DTM: 20190821124100
MDC IDC SET LEADCHNL RV PACING AMPLITUDE: 3.25 V
MDC IDC STAT BRADY AP VS PERCENT: 85.29 %
MDC IDC STAT BRADY AS VS PERCENT: 2.9 %
MDC IDC STAT BRADY RV PERCENT PACED: 11.78 %

## 2018-02-17 ENCOUNTER — Other Ambulatory Visit: Payer: Self-pay

## 2018-02-17 ENCOUNTER — Encounter: Payer: Self-pay | Admitting: Family Medicine

## 2018-02-17 ENCOUNTER — Ambulatory Visit (INDEPENDENT_AMBULATORY_CARE_PROVIDER_SITE_OTHER): Payer: Medicare HMO | Admitting: Family Medicine

## 2018-02-17 VITALS — BP 138/88 | HR 77 | Temp 97.5°F | Wt 203.1 lb

## 2018-02-17 DIAGNOSIS — N183 Chronic kidney disease, stage 3 unspecified: Secondary | ICD-10-CM

## 2018-02-17 DIAGNOSIS — Z23 Encounter for immunization: Secondary | ICD-10-CM

## 2018-02-17 DIAGNOSIS — I10 Essential (primary) hypertension: Secondary | ICD-10-CM

## 2018-02-17 DIAGNOSIS — M2550 Pain in unspecified joint: Secondary | ICD-10-CM | POA: Diagnosis not present

## 2018-02-17 LAB — COMPREHENSIVE METABOLIC PANEL
ALBUMIN: 3.8 g/dL (ref 3.5–5.2)
ALK PHOS: 138 U/L — AB (ref 39–117)
ALT: 7 U/L (ref 0–53)
AST: 13 U/L (ref 0–37)
BUN: 23 mg/dL (ref 6–23)
CO2: 25 mEq/L (ref 19–32)
CREATININE: 2.04 mg/dL — AB (ref 0.40–1.50)
Calcium: 11 mg/dL — ABNORMAL HIGH (ref 8.4–10.5)
Chloride: 108 mEq/L (ref 96–112)
GFR: 40.32 mL/min — ABNORMAL LOW (ref >60.00)
Glucose, Bld: 109 mg/dL — ABNORMAL HIGH (ref 70–99)
POTASSIUM: 4.1 meq/L (ref 3.5–5.1)
Sodium: 141 mEq/L (ref 135–145)
TOTAL PROTEIN: 6.8 g/dL (ref 6.0–8.3)
Total Bilirubin: 0.4 mg/dL (ref 0.2–1.2)

## 2018-02-17 LAB — CBC
HCT: 40.5 % (ref 39.0–52.0)
HEMOGLOBIN: 13.5 g/dL (ref 13.0–17.0)
MCHC: 33.3 g/dL (ref 30.0–36.0)
MCV: 87.1 fl (ref 78.0–100.0)
PLATELETS: 254 10*3/uL (ref 150.0–400.0)
RBC: 4.65 Mil/uL (ref 4.22–5.81)
RDW: 15.5 % (ref 11.5–15.5)
WBC: 8.9 10*3/uL (ref 4.0–10.5)

## 2018-02-17 MED ORDER — LOSARTAN POTASSIUM 50 MG PO TABS: 50.0000 mg | ORAL_TABLET | Freq: Every day | ORAL | 3 refills | Status: DC

## 2018-02-17 NOTE — Progress Notes (Signed)
Subjective:     Patient ID: [name], male   DOB: 09-May-1933, 84 y.o.   MRN: [id number]  HPI Patient's chronic problems include history of metastatic prostate cancer, history of CVA, atrial fibrillation, obstructive sleep apnea, chronic kidney disease, hypertension, hyperlipidemia, ongoing cigarette use.  He is treated with [name] for his prostate cancer and that can cause some arthralgias and edema issues.  He was complaining particularly of some arthralgias of the hands and swelling of the hands and we obtained lab work and rheumatoid studies were unremarkable.  Patient had hypercalcemia and mildly elevated PTH level of 70.  Serum protein electrophoresis was unremarkable.  He does not take any calcium supplement.  Wife states he is very sedentary.  Does not move around a lot.  No recent fever.  No recent falls.  Blood pressures been stable.  Compliant with medications but sometimes does not take his medications until much later in the day  He has lost 10 pounds since last visit but wife states his appetite is excellent.  He had recent normal TSH.  No abdominal pain.  No headaches.  No chest pains.  No chronic cough.  Past Medical History:  Diagnosis Date  . Disturbances of sulphur-bearing amino-acid metabolism   . Malignant neoplasm of prostate ([place])   . Obstructive sleep apnea (adult) (pediatric) 2004   surgery to coorrect  . Osteoarthrosis, unspecified whether generalized or localized, unspecified site   . Other and unspecified hyperlipidemia   . Other specified cardiac dysrhythmias(427.89)   . 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 Paroxysmal atrial fibrillation (HCC)   . Unspecified cerebral artery occlusion with cerebral infarction   . Unspecified disorder resulting from impaired renal function   . Unspecified essential hypertension   . Wears glasses   . Wears partial dentures    upper partial   Past Surgical History:  Procedure Laterality Date  .  APPLICATION OF A-CELL OF EXTREMITY Right 08/12/2013   Procedure: APPLICATION OF A-CELL OF EXTREMITY;  Surgeon: Theodoro Kos, DO;  Location: [place];  Service: Plastics;  Laterality: Right;  . CHOLECYSTECTOMY  1987  . COLONOSCOPY    . ent surgery  2004   correct snoring  . INGUINAL HERNIA REPAIR     left  . INSERT / REPLACE / REMOVE PACEMAKER  04/14/2013   dual chamber   / Dr Sallyanne Kuster  . MINOR IRRIGATION AND DEBRIDEMENT OF WOUND Right 03/04/2013   Procedure: MINOR IRRIGATION AND DEBRIDEMENT OF WOUND;  Surgeon: Theodoro Kos, DO;  Location: [place];  Service: Plastics;  Laterality: Right;  . ORCHIECTOMY  2002  . PERMANENT PACEMAKER INSERTION N/A 04/14/2013   Procedure: PERMANENT PACEMAKER INSERTION;  Surgeon: [name], MD;  Location: [place] CATH LAB;  Service: Cardiovascular;  Laterality: N/A;  . SKIN SPLIT GRAFT Right 08/12/2013   Procedure: SKIN GRAFT SPLIT THICKNESS WITH PLACEMENT OF VAC TO RIGHT LOWER LEG/PLACEMENT OF ACELL TO RIGHT UPPER THIGH AREA (HARVEST SITE);  Surgeon: Theodoro Kos, DO;  Location: [place];  Service: Plastics;  Laterality: Right;    reports that he has been smoking cigarettes. He has a 15.00 pack-year smoking history. He has never used smokeless tobacco. He reports that he does not drink alcohol or use drugs. family history includes Prostate cancer in his father. No Known Allergies   Review of Systems  Constitutional: Positive for fatigue. Negative for appetite change, chills and unexpected weight change.  Respiratory: Negative for cough and shortness of  breath.   Cardiovascular: Negative for chest pain and leg swelling.  Gastrointestinal: Negative for abdominal pain, nausea and vomiting.  Genitourinary: Negative for dysuria and hematuria.  Hematological: Negative for adenopathy.       Objective:   Physical Exam  Constitutional: He appears well-developed and well-nourished.  Neck: Neck supple.   Cardiovascular: Normal rate and regular rhythm.  Pulmonary/Chest: Effort normal and breath sounds normal.  Musculoskeletal: He exhibits no edema.  Neurological: He is alert.       Assessment:     #1 history of metastatic prostate cancer treated with [name] which could be causing some of his arthralgias and mild edema issues  #2 hypertension.  Improved by follow-up reading  #3 hypercalcemia with probable mild primary hyperparathyroidism    Plan:     -Flu vaccine given -Repeat labs today with comprehensive metabolic panel and CBC -Recommend routine follow-up in 3 months and sooner as needed  [name] MD [place] [facility] at [facility]

## 2018-02-26 ENCOUNTER — Encounter: Payer: Self-pay | Admitting: Cardiovascular Disease

## 2018-02-26 ENCOUNTER — Ambulatory Visit (INDEPENDENT_AMBULATORY_CARE_PROVIDER_SITE_OTHER): Payer: Medicare HMO | Admitting: Cardiovascular Disease

## 2018-02-26 VITALS — BP 154/99 | HR 68 | Ht 71.0 in | Wt 206.0 lb

## 2018-02-26 DIAGNOSIS — I480 Paroxysmal atrial fibrillation: Secondary | ICD-10-CM | POA: Diagnosis not present

## 2018-02-26 DIAGNOSIS — C799 Secondary malignant neoplasm of unspecified site: Secondary | ICD-10-CM

## 2018-02-26 DIAGNOSIS — N183 Chronic kidney disease, stage 3 unspecified: Secondary | ICD-10-CM

## 2018-02-26 DIAGNOSIS — C61 Malignant neoplasm of prostate: Secondary | ICD-10-CM | POA: Diagnosis not present

## 2018-02-26 DIAGNOSIS — I10 Essential (primary) hypertension: Secondary | ICD-10-CM | POA: Diagnosis not present

## 2018-02-26 DIAGNOSIS — Z950 Presence of cardiac pacemaker: Secondary | ICD-10-CM

## 2018-02-26 DIAGNOSIS — I495 Sick sinus syndrome: Secondary | ICD-10-CM | POA: Diagnosis not present

## 2018-02-26 MED ORDER — APIXABAN 2.5 MG PO TABS: 2.5000 mg | ORAL_TABLET | Freq: Two times a day (BID) | ORAL | 1 refills | Status: DC

## 2018-02-26 MED ORDER — METOPROLOL TARTRATE 50 MG PO TABS: 50.0000 mg | ORAL_TABLET | Freq: Two times a day (BID) | ORAL | 3 refills | Status: DC

## 2018-02-26 NOTE — Patient Instructions (Signed)
Medication Instructions:  Dr Sallyanne Kuster has recommended making the following medication changes: 1. INCREASE Metoprolol to 50 mg twice daily 2. START Eliquis 2.5 - take 1 tablet twice daily 3. STOP Clopidogrel (Plavix)  If you need a refill on your cardiac medications before your next appointment, please call your pharmacy.   Lab work: NONE ORDERED  If you have labs (blood work) drawn today and your tests are completely normal, you will receive your results only by: Marland Kitchen MyChart Message (if you have MyChart) OR . A paper copy in the mail If you have any lab test that is abnormal or we need to change your treatment, we will call you to review the results.  Testing/Procedures: NONE ORDERED  Follow-Up: At [facility], you and your health needs are our priority.  As part of our continuing mission to provide you with exceptional heart care, we have created designated Provider Care Teams.  These Care Teams include your primary Cardiologist (physician) and Advanced Practice Providers (APPs -  Physician Assistants and Nurse Practitioners) who all work together to provide you with the care you need, when you need it. You will need a follow up appointment in 18 months.  Please call our office 2 months in advance to schedule this appointment.  You may see [name], MD or one of the following Advanced Practice Providers on your designated Care Team: [place], [place] . Fabian Sharp, PA-C
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
## 2018-03-04 ENCOUNTER — Telehealth: Payer: Self-pay

## 2018-03-04 NOTE — Telephone Encounter (Signed)
Called wife Remo Lipps and no voice mail available. Will try back again at a later time.

## 2018-03-04 NOTE — Telephone Encounter (Signed)
Difficult to sort out how much is the metastatic cancer vs meds vs other.  Would offer follow up this week.  We might need to check some orthostatics.

## 2018-03-04 NOTE — Telephone Encounter (Signed)
[name] came by the office and has an appointment scheduled for Friday 03/10/18 at 3:45pm. [name] advised to call tomorrow to check to see if there is a cancellation.
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
## 2018-03-10 ENCOUNTER — Encounter: Payer: Self-pay | Admitting: Family Medicine

## 2018-03-10 ENCOUNTER — Ambulatory Visit (INDEPENDENT_AMBULATORY_CARE_PROVIDER_SITE_OTHER): Payer: Medicare HMO | Admitting: Family Medicine

## 2018-03-10 ENCOUNTER — Other Ambulatory Visit: Payer: Self-pay

## 2018-03-10 VITALS — BP 148/98 | HR 79 | Temp 97.9°F | Ht 71.0 in | Wt 206.1 lb

## 2018-03-10 DIAGNOSIS — I10 Essential (primary) hypertension: Secondary | ICD-10-CM | POA: Diagnosis not present

## 2018-03-10 DIAGNOSIS — G629 Polyneuropathy, unspecified: Secondary | ICD-10-CM | POA: Diagnosis not present

## 2018-03-10 NOTE — Progress Notes (Signed)
Subjective:     Patient ID: [name], male   DOB: 1932-08-10, 84 y.o.   MRN: [id number]  HPI Patient is seen for the following issues  Follow-up hypertension.  He has had some recent sporadic elevations including at cardiology with reading of 159/99.  Patient had been on metoprolol 25 mg and this was increased to 50 mg twice daily.  His wife had some doubts whether he was taking this once or twice daily.  Patient states he is taking this twice daily.  He has multiple other medical problems including history of tachybradycardia syndrome, chronic kidney disease, atrial fibrillation, history of obstructive sleep apnea, metastatic prostate cancer, history of CVA.  Ongoing nicotine use.  Was placed back on Eliquis 2.5 milligrams twice daily with concern for increased stroke risk.  He was taken off Plavix.  He has done well thus far with Eliquis with no bleeding concerns.  Also takes amlodipine 5 mg daily.  He has had some general arthralgias which we think may be related to his [name] which he takes for metastatic prostate cancer.  Wife noted that couple times last week he seemed to be somewhat "wobbly "with walking.  He denies any dizziness.  Does not describe any orthostatic type symptoms.  He has complained of some paresthesias in the feet.  He has no history of diabetes but has had some mild elevated nonfasting glucoses.  Recent thyroid function normal.  Recent protein electrophoresis normal.  No recent B12 level.  Past Medical History:  Diagnosis Date  . Disturbances of sulphur-bearing amino-acid metabolism   . Malignant neoplasm of prostate ([place])   . Obstructive sleep apnea (adult) (pediatric) 2004   surgery to coorrect  . Osteoarthrosis, unspecified whether generalized or localized, unspecified site   . Other and unspecified hyperlipidemia   . Other specified cardiac dysrhythmias(427.89)   . 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 Paroxysmal atrial fibrillation (HCC)    . Unspecified cerebral artery occlusion with cerebral infarction   . Unspecified disorder resulting from impaired renal function   . Unspecified essential hypertension   . Wears glasses   . Wears partial dentures    upper partial   Past Surgical History:  Procedure Laterality Date  . APPLICATION OF A-CELL OF EXTREMITY Right 08/12/2013   Procedure: APPLICATION OF A-CELL OF EXTREMITY;  Surgeon: Theodoro Kos, DO;  Location: [facility];  Service: Plastics;  Laterality: Right;  . CHOLECYSTECTOMY  1987  . COLONOSCOPY    . ent surgery  2004   correct snoring  . INGUINAL HERNIA REPAIR     left  . INSERT / REPLACE / REMOVE PACEMAKER  04/14/2013   dual chamber   / Dr Sallyanne Kuster  . MINOR IRRIGATION AND DEBRIDEMENT OF WOUND Right 03/04/2013   Procedure: MINOR IRRIGATION AND DEBRIDEMENT OF WOUND;  Surgeon: Theodoro Kos, DO;  Location: [facility];  Service: Plastics;  Laterality: Right;  . ORCHIECTOMY  2002  . PERMANENT PACEMAKER INSERTION N/A 04/14/2013   Procedure: PERMANENT PACEMAKER INSERTION;  Surgeon: [name], MD;  Location: [facility] CATH LAB;  Service: Cardiovascular;  Laterality: N/A;  . SKIN SPLIT GRAFT Right 08/12/2013   Procedure: SKIN GRAFT SPLIT THICKNESS WITH PLACEMENT OF VAC TO RIGHT LOWER LEG/PLACEMENT OF ACELL TO RIGHT UPPER THIGH AREA (HARVEST SITE);  Surgeon: Theodoro Kos, DO;  Location: [facility];  Service: Plastics;  Laterality: Right;    reports that he has been smoking cigarettes. He has a 15.00 pack-year smoking  history. He has never used smokeless tobacco. He reports that he does not drink alcohol or use drugs. family history includes Prostate cancer in his father. No Known Allergies   Review of Systems  Constitutional: Negative for chills and fever.  Eyes: Negative for visual disturbance.  Respiratory: Negative for shortness of breath.   Cardiovascular: Negative for chest pain.  Gastrointestinal: Negative for abdominal  pain, nausea and vomiting.  Genitourinary: Negative for dysuria.  Musculoskeletal: Positive for arthralgias.  Neurological: Positive for numbness. Negative for syncope.  Psychiatric/Behavioral: Negative for confusion.       Objective:   Physical Exam  Constitutional: He appears well-developed and well-nourished.  Cardiovascular: Normal rate.  Pulmonary/Chest: Effort normal and breath sounds normal.  Musculoskeletal:  Only trace edema lower legs bilaterally  Neurological: No cranial nerve deficit.  Patient has diminished sensation to touch with monofilament testing on the ventral aspect of both feet.  Intact lower legs bilaterally       Assessment:     #1 hypertension.  Follow-up reading at rest left arm seated 130/78 and we obtained identical reading standing  #2 history of atrial fibrillation.  Patient recently placed back on Eliquis with increased dose of beta-blocker  #3 mild hypercalcemia with mildly elevated PTH levels  #4 sensory neuropathy involving both feet.  No history of diabetes.  Recent TSH and serum protein electrophoresis normal.  Has not had B12 levels checked.    Plan:     -Check A1c and B 12 -Suggest he consider cane use for extra point of contact to reduce risk of falls -Consider trial of physical therapy -We discussed possible referral to neurology for further evaluation regarding his neuropathy symptoms-wife would like to check B12 levels first -Confirm that he is taking his metoprolol 50 mg twice daily -Scheduled follow-up in 2 months.  Recheck chemistries with calcium at that point  [name] MD [facility]

## 2018-03-10 NOTE — Patient Instructions (Signed)
Make sure you are taking the Metoprolol 50 mg TWICE daily  Continue with same doses of other medications.  Monitor BP at home and be in touch if consistently > 130/80.

## 2018-03-11 LAB — HEMOGLOBIN A1C: Hgb A1c MFr Bld: 6.2 % (ref 4.6–6.5)

## 2018-03-11 LAB — VITAMIN B12: Vitamin B-12: 452 pg/mL (ref 211–911)

## 2018-03-12 ENCOUNTER — Other Ambulatory Visit: Payer: Self-pay

## 2018-03-12 ENCOUNTER — Encounter: Payer: Self-pay | Admitting: Cardiovascular Disease

## 2018-03-12 ENCOUNTER — Ambulatory Visit: Payer: Medicare HMO | Admitting: Cardiovascular Disease

## 2018-03-12 DIAGNOSIS — E7800 Pure hypercholesterolemia, unspecified: Secondary | ICD-10-CM | POA: Diagnosis not present

## 2018-03-12 DIAGNOSIS — I10 Essential (primary) hypertension: Secondary | ICD-10-CM | POA: Diagnosis not present

## 2018-03-12 DIAGNOSIS — Z950 Presence of cardiac pacemaker: Secondary | ICD-10-CM | POA: Diagnosis not present

## 2018-03-12 DIAGNOSIS — I480 Paroxysmal atrial fibrillation: Secondary | ICD-10-CM | POA: Diagnosis not present

## 2018-03-12 DIAGNOSIS — G629 Polyneuropathy, unspecified: Secondary | ICD-10-CM

## 2018-03-12 NOTE — Assessment & Plan Note (Signed)
Treatment of hyperlipidemia on simvastatin with lipid profile performed 05/17/2017 revealing total cholesterol 198, LDL 112 and HDL 46.

## 2018-03-12 NOTE — Assessment & Plan Note (Signed)
History of tachybradycardia syndrome and PAF status post pacemaker insertion 04/14/2013 followed by Dr. Sallyanne Kuster with 4.5 years left on battery life.

## 2018-03-12 NOTE — Progress Notes (Signed)
03/12/2018 ARTHUR SPEAGLE   1932-11-06  [id number]  Primary Physician Burchette, Alinda Sierras, MD Primary Cardiologist: Lorretta Harp MD Lupe Carney, [place]  HPI:  [name] is a 84 y.o.  moderately overweight married African American male with history of hypertension, hyperlipidemia, paroxysmal A. Fib/ sinus syndrome status post permanent transvenous pacemaker insertion by Dr. Sallyanne Kuster 04/14/13. I last saw him in the office 02/01/2016. He has no other prior cardiac history. He denies chest pain or shortness of breath. Recent lipid profile performed  05/17/2017 revealed a total cholesterol 198, LDL of 112 and HDL 46.  He denies chest pain or shortness of breath. Since I saw him 2 years ago he is remained stable.  He did see Dr. Sallyanne Kuster recently who did note some brief episodes of PAF on pacemaker interrogation his blood pressure was mildly elevated and he increase his metoprolol.  He denies chest pain or shortness of breath.  Current Meds  Medication Sig  . albuterol (PROVENTIL HFA;VENTOLIN HFA) 108 (90 Base) MCG/ACT inhaler Inhale 2 puffs into the lungs every 6 (six) hours as needed for wheezing or shortness of breath.  Marland Kitchen amLODipine (NORVASC) 5 MG tablet TAKE 1 TABLET EVERY DAY  . apixaban (ELIQUIS) 2.5 MG TABS tablet Take 1 tablet (2.5 mg total) by mouth 2 (two) times daily.  Marland Kitchen losartan (COZAAR) 50 MG tablet Take 1 tablet (50 mg total) by mouth daily.  . metoprolol tartrate (LOPRESSOR) 50 MG tablet Take 1 tablet (50 mg total) by mouth 2 (two) times daily.  . Multiple Vitamin (MULTIVITAMIN WITH MINERALS) TABS tablet Take 1 tablet by mouth daily.  . simvastatin (ZOCOR) 40 MG tablet TAKE 1 TABLET EVERY DAY  . XTANDI 40 MG capsule      No Known Allergies  Social History   Socioeconomic History  . Marital status: Married    Spouse name: Consulting civil engineer  . Number of children: Not on file  . Years of education: Not on file  . Highest education level: Not on file    Occupational History  . Occupation: Retired  . Occupation: RETIRED    Employer: RETIRED  Social Needs  . Financial resource strain: Not on file  . Food insecurity:    Worry: Not on file    Inability: Not on file  . Transportation needs:    Medical: Not on file    Non-medical: Not on file  Tobacco Use  . Smoking status: Current Some Day Smoker    Packs/day: 0.50    Years: 30.00    Pack years: 15.00    Types: Cigarettes  . Smokeless tobacco: Never Used  . Tobacco comment:  pt stated he smokes 1 pack every 2 wks  Substance and Sexual Activity  . Alcohol use: No  . Drug use: No  . Sexual activity: Not on file    Comment: started smoking 2-3 cig daily  Lifestyle  . Physical activity:    Days per week: Not on file    Minutes per session: Not on file  . Stress: Not on file  Relationships  . Social connections:    Talks on phone: Not on file    Gets together: Not on file    Attends religious service: Not on file    Active member of club or organization: Not on file    Attends meetings of clubs or organizations: Not on file    Relationship status: Not on file  . Intimate partner violence:  Fear of current or ex partner: Not on file    Emotionally abused: Not on file    Physically abused: Not on file    Forced sexual activity: Not on file  Other Topics Concern  . Not on file  Social History Narrative  . Not on file     Review of Systems: General: negative for chills, fever, night sweats or weight changes.  Cardiovascular: negative for chest pain, dyspnea on exertion, edema, orthopnea, palpitations, paroxysmal nocturnal dyspnea or shortness of breath Dermatological: negative for rash Respiratory: negative for cough or wheezing Urologic: negative for hematuria Abdominal: negative for nausea, vomiting, diarrhea, bright red blood per rectum, melena, or hematemesis Neurologic: negative for visual changes, syncope, or dizziness All other systems reviewed and are otherwise  negative except as noted above.    Blood pressure (!) 150/88, pulse 76, height 5\' 11"  (1.803 m), weight 207 lb 3.2 oz (94 kg), SpO2 96 %.  General appearance: alert and no distress Neck: no adenopathy, no carotid bruit, no JVD, supple, symmetrical, trachea midline and thyroid not enlarged, symmetric, no tenderness/mass/nodules Lungs: clear to auscultation bilaterally Heart: regular rate and rhythm, S1, S2 normal, no murmur, click, rub or gallop Extremities: extremities normal, atraumatic, no cyanosis or edema Pulses: 2+ and symmetric Skin: Skin color, texture, turgor normal. No rashes or lesions Neurologic: Alert and oriented X 3, normal strength and tone. Normal symmetric reflexes. Normal coordination and gait  EKG not performed today  ASSESSMENT AND PLAN:   Hyperlipidemia Treatment of hyperlipidemia on simvastatin with lipid profile performed 05/17/2017 revealing total cholesterol 198, LDL 112 and HDL 46.  Essential hypertension History of essential hypertension her blood pressure measured today 150/88.  He did recently see Dr. Sallyanne Kuster who increase his metoprolol.  He is also on amlodipine.  Continue current meds at current dosing.  Pacemaker - Medtronic, implanted 04/14/13 History of tachybradycardia syndrome and PAF status post pacemaker insertion 04/14/2013 followed by Dr. Sallyanne Kuster with 4.5 years left on battery life.  Paroxysmal atrial fibrillation History of PAF on Eliquis oral anticoagulation.      Lorretta Harp MD FACP,FACC,FAHA, [facility] 03/12/2018 11:53 AM

## 2018-03-12 NOTE — Patient Instructions (Signed)
Medication Instructions:  Your physician recommends that you continue on your current medications as directed. Please refer to the Current Medication list given to you today.  If you need a refill on your cardiac medications before your next appointment, please call your pharmacy.   Lab work: none If you have labs (blood work) drawn today and your tests are completely normal, you will receive your results only by: . MyChart Message (if you have MyChart) OR . A paper copy in the mail If you have any lab test that is abnormal or we need to change your treatment, we will call you to review the results.  Testing/Procedures: none  Follow-Up: At [facility], you and your health needs are our priority.  As part of our continuing mission to provide you with exceptional heart care, we have created designated Provider Care Teams.  These Care Teams include your primary Cardiologist (physician) and Advanced Practice Providers (APPs -  Physician Assistants and Nurse Practitioners) who all work together to provide you with the care you need, when you need it. You will need a follow up appointment in 12 months.  Please call our office 2 months in advance to schedule this appointment.  You may see Dr. Berry or one of the following Advanced Practice Providers on your designated Care Team:   Luke Kilroy, PA-C Krista Kroeger, PA-C . Callie Goodrich, PA-C  Any Other Special Instructions Will Be Listed Below (If Applicable).    

## 2018-03-12 NOTE — Assessment & Plan Note (Signed)
History of PAF on Eliquis oral anticoagulation. 

## 2018-03-12 NOTE — Assessment & Plan Note (Signed)
History of essential hypertension her blood pressure measured today 150/88.  He did recently see Dr. Sallyanne Kuster who increase his metoprolol.  He is also on amlodipine.  Continue current meds at current dosing.

## 2018-04-02 ENCOUNTER — Telehealth: Payer: Self-pay

## 2018-04-02 NOTE — Telephone Encounter (Signed)
Attempted to confirm remote transmission with pt. No answer and was unable to leave a message.   

## 2018-04-03 ENCOUNTER — Encounter: Payer: Self-pay | Admitting: Cardiology

## 2018-04-14 ENCOUNTER — Telehealth: Payer: Self-pay | Admitting: Cardiology
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
## 2018-04-15 ENCOUNTER — Ambulatory Visit (INDEPENDENT_AMBULATORY_CARE_PROVIDER_SITE_OTHER): Payer: Medicare HMO

## 2018-04-15 DIAGNOSIS — I495 Sick sinus syndrome: Secondary | ICD-10-CM

## 2018-04-16 NOTE — Progress Notes (Signed)
Remote pacemaker transmission.   

## 2018-04-22 ENCOUNTER — Encounter: Payer: Self-pay | Admitting: Cardiology

## 2018-04-23 DIAGNOSIS — C61 Malignant neoplasm of prostate: Secondary | ICD-10-CM | POA: Diagnosis not present

## 2018-04-23 DIAGNOSIS — Z192 Hormone resistant malignancy status: Secondary | ICD-10-CM | POA: Diagnosis not present

## 2018-05-14 LAB — CUP PACEART REMOTE DEVICE CHECK
Battery Remaining Longevity: 57 mo
Battery Voltage: 2.99 V
Brady Statistic AS VP Percent: 0.01 %
Brady Statistic AS VS Percent: 1.69 %
Brady Statistic RA Percent Paced: 97.79 %
Brady Statistic RV Percent Paced: 1.82 %
Date Time Interrogation Session: 20191203193412
Implantable Lead Implant Date: 20141202
Implantable Lead Location: 753860
Implantable Lead Model: 5076
Lead Channel Impedance Value: 342 Ohm
Lead Channel Pacing Threshold Amplitude: 0.625 V
Lead Channel Pacing Threshold Amplitude: 1.25 V
Lead Channel Pacing Threshold Pulse Width: 0.4 ms
Lead Channel Pacing Threshold Pulse Width: 0.4 ms
Lead Channel Sensing Intrinsic Amplitude: 15.625 mV
Lead Channel Sensing Intrinsic Amplitude: 2.125 mV
Lead Channel Sensing Intrinsic Amplitude: 2.125 mV
MDC IDC LEAD IMPLANT DT: 20141202
MDC IDC LEAD LOCATION: 753859
MDC IDC MSMT LEADCHNL RA IMPEDANCE VALUE: 418 Ohm
MDC IDC MSMT LEADCHNL RV IMPEDANCE VALUE: 380 Ohm
MDC IDC MSMT LEADCHNL RV IMPEDANCE VALUE: 494 Ohm
MDC IDC MSMT LEADCHNL RV SENSING INTR AMPL: 15.625 mV
MDC IDC PG IMPLANT DT: 20141202
MDC IDC SET LEADCHNL RA PACING AMPLITUDE: 2 V
MDC IDC SET LEADCHNL RV PACING AMPLITUDE: 2.5 V
MDC IDC SET LEADCHNL RV PACING PULSEWIDTH: 0.4 ms
MDC IDC SET LEADCHNL RV SENSING SENSITIVITY: 0.9 mV
MDC IDC STAT BRADY AP VP PERCENT: 1.79 %
MDC IDC STAT BRADY AP VS PERCENT: 96.51 %

## 2018-06-10 ENCOUNTER — Telehealth: Payer: Self-pay | Admitting: Cardiovascular Disease

## 2018-06-10 NOTE — Telephone Encounter (Signed)
New Message ° ° °Pt c/o medication issue: ° °1. Name of Medication: apixaban (ELIQUIS) 2.5 MG TABS tablet  °2. How are you currently taking this medication (dosage and times per day)? Take 1 tablet (2.5 mg total) by mouth 2 (two) times daily. ° °3. Are you having a reaction (difficulty breathing--STAT)?  ° °4. What is your medication issue? Patients spouse Joan is calling on his behalf. They tried to get the rx filled but the patient has not met his deductible so the cost of the prescription is too much. She states that they were advised that they may be able to get patient assistance for the medication. Please call to discuss.  ° °

## 2018-06-10 NOTE — Telephone Encounter (Signed)
Spoke with patient's wife. She would like to come pick up patient assistance application for Eliquis. Printed and left at front desk for pick up. Explained that they will need pharmacy print out demonstrating they have spent 3% on medications for THIS calendar year and proof of income.   She reports his copay for Eliquis is $105/90 days, explained this is an average of a little more than $30/month

## 2018-06-13 LAB — CUP PACEART INCLINIC DEVICE CHECK
Date Time Interrogation Session: 20200131145419
Implantable Lead Implant Date: 20141202
Implantable Lead Implant Date: 20141202
Implantable Lead Location: 753859
Implantable Lead Location: 753860
Implantable Pulse Generator Implant Date: 20141202

## 2018-06-18 DIAGNOSIS — C61 Malignant neoplasm of prostate: Secondary | ICD-10-CM | POA: Diagnosis not present

## 2018-06-19 ENCOUNTER — Other Ambulatory Visit: Payer: Self-pay | Admitting: Family Medicine

## 2018-06-23 DIAGNOSIS — Z192 Hormone resistant malignancy status: Secondary | ICD-10-CM | POA: Diagnosis not present

## 2018-06-23 DIAGNOSIS — C61 Malignant neoplasm of prostate: Secondary | ICD-10-CM | POA: Diagnosis not present

## 2018-06-23 DIAGNOSIS — C775 Secondary and unspecified malignant neoplasm of intrapelvic lymph nodes: Secondary | ICD-10-CM | POA: Diagnosis not present

## 2018-06-23 DIAGNOSIS — R8279 Other abnormal findings on microbiological examination of urine: Secondary | ICD-10-CM | POA: Diagnosis not present

## 2018-06-23 DIAGNOSIS — I509 Heart failure, unspecified: Secondary | ICD-10-CM | POA: Diagnosis not present

## 2018-06-23 DIAGNOSIS — C7951 Secondary malignant neoplasm of bone: Secondary | ICD-10-CM | POA: Diagnosis not present

## 2018-07-15 ENCOUNTER — Encounter: Payer: Medicare HMO | Admitting: *Deleted

## 2018-07-16 ENCOUNTER — Telehealth: Payer: Self-pay

## 2018-07-16 NOTE — Telephone Encounter (Signed)
Spoke with patient to remind of missed remote transmission 

## 2018-07-22 ENCOUNTER — Ambulatory Visit (INDEPENDENT_AMBULATORY_CARE_PROVIDER_SITE_OTHER): Payer: Medicare HMO | Admitting: *Deleted

## 2018-07-22 DIAGNOSIS — I495 Sick sinus syndrome: Secondary | ICD-10-CM

## 2018-07-23 ENCOUNTER — Other Ambulatory Visit: Payer: Self-pay

## 2018-07-23 LAB — CUP PACEART REMOTE DEVICE CHECK
Battery Remaining Longevity: 53 mo
Battery Voltage: 2.99 V
Brady Statistic AP VP Percent: 1.19 %
Brady Statistic AP VS Percent: 97.8 %
Brady Statistic AS VP Percent: 0 %
Brady Statistic RA Percent Paced: 98.82 %
Brady Statistic RV Percent Paced: 1.2 %
Date Time Interrogation Session: 20200309200827
Implantable Lead Implant Date: 20141202
Implantable Lead Implant Date: 20141202
Implantable Lead Location: 753859
Implantable Lead Location: 753860
Implantable Lead Model: 5076
Implantable Lead Model: 5076
Implantable Pulse Generator Implant Date: 20141202
Lead Channel Impedance Value: 342 Ohm
Lead Channel Impedance Value: 361 Ohm
Lead Channel Impedance Value: 418 Ohm
Lead Channel Impedance Value: 475 Ohm
Lead Channel Pacing Threshold Amplitude: 0.625 V
Lead Channel Pacing Threshold Amplitude: 1.25 V
Lead Channel Pacing Threshold Pulse Width: 0.4 ms
Lead Channel Pacing Threshold Pulse Width: 0.4 ms
Lead Channel Sensing Intrinsic Amplitude: 0.875 mV
Lead Channel Sensing Intrinsic Amplitude: 0.875 mV
Lead Channel Sensing Intrinsic Amplitude: 15 mV
Lead Channel Sensing Intrinsic Amplitude: 15 mV
Lead Channel Setting Pacing Amplitude: 2 V
Lead Channel Setting Pacing Amplitude: 2.5 V
Lead Channel Setting Pacing Pulse Width: 0.4 ms
Lead Channel Setting Sensing Sensitivity: 0.9 mV
MDC IDC STAT BRADY AS VS PERCENT: 1.01 %

## 2018-07-30 ENCOUNTER — Encounter: Payer: Self-pay | Admitting: Cardiology

## 2018-07-30 NOTE — Progress Notes (Signed)
Remote pacemaker transmission.   

## 2018-08-04 ENCOUNTER — Telehealth: Payer: Self-pay

## 2018-08-04 NOTE — Telephone Encounter (Signed)
Author phoned pt. To reschedule awv in light of covid-19 pandemic. Author spoke to [place] ,wife, who stated she could not do a virtual visit, but would rescheduled for summer timeframe. Appointment rescheduled for 6/3.

## 2018-08-05 ENCOUNTER — Ambulatory Visit: Payer: Medicare HMO

## 2018-09-18 ENCOUNTER — Other Ambulatory Visit: Payer: Self-pay | Admitting: Cardiovascular Disease

## 2018-09-18 ENCOUNTER — Other Ambulatory Visit: Payer: Self-pay | Admitting: Family Medicine

## 2018-09-18 NOTE — Telephone Encounter (Signed)
°*  STAT* If patient is at the pharmacy, call can be transferred to refill team.   1. Which medications need to be refilled? (please list name of each medication and dose if known) apixaban (ELIQUIS) 2.5 MG TABS tablet  2. Which pharmacy/location (including street and city if local pharmacy) is medication to be sent to? [id number] - [place], [place] - [address]  3. Do they need a 30 day or 90 day supply? Two week supply to hold him over until he gets his mail order supply.

## 2018-09-19 ENCOUNTER — Other Ambulatory Visit: Payer: Self-pay

## 2018-09-19 ENCOUNTER — Ambulatory Visit (INDEPENDENT_AMBULATORY_CARE_PROVIDER_SITE_OTHER): Payer: Medicare HMO | Admitting: Family Medicine

## 2018-09-19 ENCOUNTER — Other Ambulatory Visit: Payer: Self-pay | Admitting: Cardiovascular Disease

## 2018-09-19 DIAGNOSIS — E7800 Pure hypercholesterolemia, unspecified: Secondary | ICD-10-CM | POA: Diagnosis not present

## 2018-09-19 DIAGNOSIS — N183 Chronic kidney disease, stage 3 unspecified: Secondary | ICD-10-CM

## 2018-09-19 DIAGNOSIS — I10 Essential (primary) hypertension: Secondary | ICD-10-CM

## 2018-09-19 DIAGNOSIS — R739 Hyperglycemia, unspecified: Secondary | ICD-10-CM | POA: Diagnosis not present

## 2018-09-19 MED ORDER — APIXABAN 2.5 MG PO TABS: 2.5000 mg | ORAL_TABLET | Freq: Two times a day (BID) | ORAL | 0 refills | Status: DC

## 2018-09-19 NOTE — Telephone Encounter (Signed)
Patient has an appointment by Doxy today at 3:30pm

## 2018-09-19 NOTE — Progress Notes (Signed)
Patient ID: [name], male   DOB: 1932/06/04, 85 y.o.   MRN: [id number]  This visit type was conducted due to national recommendations for restrictions regarding the COVID-19 pandemic in an effort to limit this patient's exposure and mitigate transmission in our community.   Virtual Visit via Video Note  I connected with [name] on 09/19/18 at  3:30 PM EDT by a video enabled telemedicine application and verified that I am speaking with the correct person using two identifiers.  Location patient: home Location provider:work or home office Persons participating in the virtual visit: patient, provider  I discussed the limitations of evaluation and management by telemedicine and the availability of in person appointments. The patient expressed understanding and agreed to proceed.   HPI: Patient has multiple chronic problems including history of hypertension, history of CVA, atrial fibrillation, obstructive sleep apnea, ongoing nicotine use, metastatic prostate cancer, chronic kidney disease, mild hypercalcemia probably related to early primary hyperparathyroidism, hyperlipidemia, hyperglycemia.  He states he is doing fairly well.  Denies any recent cough, fever, increased dyspnea over baseline.  He is on simvastatin and last lipids were January 2019.  He has chronic kidney disease with creatinine around 2.  Not monitoring blood pressures regularly.  No recent dizziness.  Last A1c 6.2%.  Remains on anticoagulation with Eliquis which she is getting through cardiology.  Recent falls.  No depression concerns.   ROS: See pertinent positives and negatives per HPI.  Past Medical History:  Diagnosis Date  . Disturbances of sulphur-bearing amino-acid metabolism   . Malignant neoplasm of prostate ([place])   . Obstructive sleep apnea (adult) (pediatric) 2004   surgery to coorrect  . Osteoarthrosis, unspecified whether generalized or localized, unspecified site   . Other and unspecified  hyperlipidemia   . Other specified cardiac dysrhythmias(427.89)   . 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 Paroxysmal atrial fibrillation (HCC)   . Unspecified cerebral artery occlusion with cerebral infarction   . Unspecified disorder resulting from impaired renal function   . Unspecified essential hypertension   . Wears glasses   . Wears partial dentures    upper partial    Past Surgical History:  Procedure Laterality Date  . APPLICATION OF A-CELL OF EXTREMITY Right 08/12/2013   Procedure: APPLICATION OF A-CELL OF EXTREMITY;  Surgeon: Theodoro Kos, DO;  Location: [facility];  Service: Plastics;  Laterality: Right;  . CHOLECYSTECTOMY  1987  . COLONOSCOPY    . ent surgery  2004   correct snoring  . INGUINAL HERNIA REPAIR     left  . INSERT / REPLACE / REMOVE PACEMAKER  04/14/2013   dual chamber   / Dr Sallyanne Kuster  . MINOR IRRIGATION AND DEBRIDEMENT OF WOUND Right 03/04/2013   Procedure: MINOR IRRIGATION AND DEBRIDEMENT OF WOUND;  Surgeon: Theodoro Kos, DO;  Location: [facility];  Service: Plastics;  Laterality: Right;  . ORCHIECTOMY  2002  . PERMANENT PACEMAKER INSERTION N/A 04/14/2013   Procedure: PERMANENT PACEMAKER INSERTION;  Surgeon: [name], MD;  Location: [facility];  Service: Cardiovascular;  Laterality: N/A;  . SKIN SPLIT GRAFT Right 08/12/2013   Procedure: SKIN GRAFT SPLIT THICKNESS WITH PLACEMENT OF VAC TO RIGHT LOWER LEG/PLACEMENT OF ACELL TO RIGHT UPPER THIGH AREA (HARVEST SITE);  Surgeon: Theodoro Kos, DO;  Location: [facility];  Service: Plastics;  Laterality: Right;    Family History  Problem Relation Age of Onset  . Prostate cancer Father     SOCIAL  HX: Lives with his wife.  Ongoing nicotine use.   Current Outpatient Medications:  .  albuterol (PROVENTIL HFA;VENTOLIN HFA) 108 (90 Base) MCG/ACT inhaler, Inhale 2 puffs into the lungs every 6 (six) hours as needed for wheezing or shortness of breath.,  Disp: 1 Inhaler, Rfl: 3 .  amLODipine (NORVASC) 5 MG tablet, TAKE 1 TABLET EVERY DAY, Disp: 90 tablet, Rfl: 1 .  apixaban (ELIQUIS) 2.5 MG TABS tablet, Take 1 tablet (2.5 mg total) by mouth 2 (two) times daily., Disp: 60 tablet, Rfl: 1 .  apixaban (ELIQUIS) 2.5 MG TABS tablet, Take 1 tablet (2.5 mg total) by mouth 2 (two) times daily for 15 days., Disp: 30 tablet, Rfl: 0 .  losartan (COZAAR) 50 MG tablet, Take 1 tablet (50 mg total) by mouth daily., Disp: 90 tablet, Rfl: 3 .  metoprolol tartrate (LOPRESSOR) 50 MG tablet, Take 1 tablet (50 mg total) by mouth 2 (two) times daily., Disp: 180 tablet, Rfl: 3 .  Multiple Vitamin (MULTIVITAMIN WITH MINERALS) TABS tablet, Take 1 tablet by mouth daily., Disp: , Rfl:  .  simvastatin (ZOCOR) 40 MG tablet, TAKE 1 TABLET EVERY DAY, Disp: 90 tablet, Rfl: 0 .  XTANDI 40 MG capsule, , Disp: , Rfl:   EXAM:  VITALS per patient if applicable:  GENERAL: alert, oriented, appears well and in no acute distress  HEENT: atraumatic, conjunttiva clear, no obvious abnormalities on inspection of external nose and ears  NECK: normal movements of the head and neck  LUNGS: on inspection no signs of respiratory distress, breathing rate appears normal, no obvious gross SOB, gasping or wheezing  CV: no obvious cyanosis  MS: moves all visible extremities without noticeable abnormality  PSYCH/NEURO: pleasant and cooperative, no obvious depression or anxiety, speech and thought processing grossly intact  ASSESSMENT AND PLAN:  Discussed the following assessment and plan:  #1 history of hyperglycemia.  Last A1c 6.2% -Ordered future labs for roughly 2 months from now for A1c  #2 chronic kidney disease -Future lab for basic metabolic panel and CBC  #3 dyslipidemia -Future lab for lipids and hepatic panel  #4 hypertension -Reviewed medications.  Continue current medications  #5 hypercalcemia.  He had mildly elevated PTH.  Recheck basic metabolic panel at  follow-up  #6 history of atrial fibrillation -Continue Eliquis     I discussed the assessment and treatment plan with the patient. The patient was provided an opportunity to ask questions and all were answered. The patient agreed with the plan and demonstrated an understanding of the instructions.   The patient was advised to call back or seek an in-person evaluation if the symptoms worsen or if the condition fails to improve as anticipated.    Carolann Littler, MD

## 2018-09-19 NOTE — Telephone Encounter (Signed)
This is a tough question.  His creatinine is 2 (GFR around 40) and I guess we could use dabigatran, although that has some issues with P-GP inhibition as well, especially in patients with renal dysfunction The only other alternative of course is warfarin. I generally just follow the Ryett's pacemaker and Dr. Gwenlyn Found is his primary cardiologist.  I would defer the question of switching to warfarin to him. MCr

## 2018-09-19 NOTE — Telephone Encounter (Signed)
85 yo, 94kg, Scr 2.04 on 02/17/18 Last ov 03/12/18 Indication afib  Of note pt on Xtandi which is listed as a Cat D interaction due to possibility of decreased apixaban concentrations with combination due to CYP interactions. [name] is listed as cat X with Xarelto. Pradaxa is not listed as interaction since less CYP involvement. Due to history of stroke will route to Dr. Loletha Grayer for input on anticoag.

## 2018-09-19 NOTE — Telephone Encounter (Signed)
Agree. I think based on mechanism it is reasonable to continue Eliquis or change to Pradaxa. Given age Pradaxa is not ideal. Will route to Dr. Gwenlyn Found for comment

## 2018-09-30 ENCOUNTER — Telehealth: Payer: Self-pay | Admitting: *Deleted

## 2018-09-30 DIAGNOSIS — E7800 Pure hypercholesterolemia, unspecified: Secondary | ICD-10-CM

## 2018-09-30 DIAGNOSIS — I10 Essential (primary) hypertension: Secondary | ICD-10-CM

## 2018-09-30 NOTE — Telephone Encounter (Signed)
Copied from [facility] [phone]. Topic: Quick Communication - Rx Refill/Question >> Sep 30, 2018  4:47 PM Izola Price, [place] A wrote: Medication: simvastatin (ZOCOR) 40 MG tablet,amLODipine (NORVASC) 5 MG tablet   Has the patient contacted their pharmacy? Yes (Agent: If no, request that the patient contact the pharmacy for the refill.) (Agent: If yes, when and what did the pharmacy advise?)Contact PCP  Preferred Pharmacy (with phone number or street name): [facility], [place] [phone] (Phone) [phone] (Fax)    Agent: Please be advised that RX refills may take up to 3 business days. We ask that you follow-up with your pharmacy.

## 2018-10-03 ENCOUNTER — Other Ambulatory Visit: Payer: Self-pay

## 2018-10-03 MED ORDER — AMLODIPINE BESYLATE 5 MG PO TABS: 5.0000 mg | ORAL_TABLET | Freq: Every day | ORAL | 0 refills | Status: DC

## 2018-10-03 MED ORDER — SIMVASTATIN 40 MG PO TABS: 40.0000 mg | ORAL_TABLET | Freq: Every day | ORAL | 0 refills | Status: DC

## 2018-10-03 NOTE — Telephone Encounter (Signed)
Refills have been sent to [facility] for 90 days

## 2018-10-14 ENCOUNTER — Other Ambulatory Visit: Payer: Self-pay

## 2018-10-14 ENCOUNTER — Other Ambulatory Visit (INDEPENDENT_AMBULATORY_CARE_PROVIDER_SITE_OTHER): Payer: Medicare HMO

## 2018-10-14 DIAGNOSIS — N183 Chronic kidney disease, stage 3 unspecified: Secondary | ICD-10-CM

## 2018-10-14 DIAGNOSIS — R739 Hyperglycemia, unspecified: Secondary | ICD-10-CM

## 2018-10-14 DIAGNOSIS — E7800 Pure hypercholesterolemia, unspecified: Secondary | ICD-10-CM | POA: Diagnosis not present

## 2018-10-14 LAB — CBC WITH DIFFERENTIAL/PLATELET
Basophils Absolute: 0 10*3/uL (ref 0.0–0.1)
Basophils Relative: 0.4 % (ref 0.0–3.0)
Eosinophils Absolute: 0.2 10*3/uL (ref 0.0–0.7)
Eosinophils Relative: 2.8 % (ref 0.0–5.0)
HCT: 43.3 % (ref 39.0–52.0)
Hemoglobin: 14.5 g/dL (ref 13.0–17.0)
Lymphocytes Relative: 29.2 % (ref 12.0–46.0)
Lymphs Abs: 2.4 10*3/uL (ref 0.7–4.0)
MCHC: 33.5 g/dL (ref 30.0–36.0)
MCV: 88.9 fl (ref 78.0–100.0)
Monocytes Absolute: 0.8 10*3/uL (ref 0.1–1.0)
Monocytes Relative: 9.3 % (ref 3.0–12.0)
Neutro Abs: 4.7 10*3/uL (ref 1.4–7.7)
Neutrophils Relative %: 58.3 % (ref 43.0–77.0)
Platelets: 181 10*3/uL (ref 150.0–400.0)
RBC: 4.88 Mil/uL (ref 4.22–5.81)
RDW: 15.5 % (ref 11.5–15.5)
WBC: 8.1 10*3/uL (ref 4.0–10.5)

## 2018-10-14 LAB — LIPID PANEL
Cholesterol: 152 mg/dL (ref 0–200)
HDL: 47.8 mg/dL (ref >39.00)
LDL Cholesterol: 82 mg/dL (ref 0–99)
NonHDL: 104.54
Total CHOL/HDL Ratio: 3
Triglycerides: 114 mg/dL (ref 0.0–149.0)
VLDL: 22.8 mg/dL (ref 0.0–40.0)

## 2018-10-14 LAB — BASIC METABOLIC PANEL
BUN: 16 mg/dL (ref 6–23)
CO2: 25 mEq/L (ref 19–32)
Calcium: 10.1 mg/dL (ref 8.4–10.5)
Chloride: 109 mEq/L (ref 96–112)
Creatinine, Ser: 1.76 mg/dL — ABNORMAL HIGH (ref 0.40–1.50)
GFR: 44.91 mL/min — ABNORMAL LOW (ref >60.00)
Glucose, Bld: 85 mg/dL (ref 70–99)
Potassium: 4.4 mEq/L (ref 3.5–5.1)
Sodium: 142 mEq/L (ref 135–145)

## 2018-10-14 LAB — HEPATIC FUNCTION PANEL
ALT: 6 U/L (ref 0–53)
AST: 11 U/L (ref 0–37)
Albumin: 3.7 g/dL (ref 3.5–5.2)
Alkaline Phosphatase: 130 U/L — ABNORMAL HIGH (ref 39–117)
Bilirubin, Direct: 0.1 mg/dL (ref 0.0–0.3)
Total Bilirubin: 0.4 mg/dL (ref 0.2–1.2)
Total Protein: 6.3 g/dL (ref 6.0–8.3)

## 2018-10-15 ENCOUNTER — Ambulatory Visit: Payer: Medicare HMO

## 2018-10-15 LAB — HEMOGLOBIN A1C: Hgb A1c MFr Bld: 6.6 % — ABNORMAL HIGH (ref 4.6–6.5)

## 2018-10-21 ENCOUNTER — Ambulatory Visit (INDEPENDENT_AMBULATORY_CARE_PROVIDER_SITE_OTHER): Payer: Medicare HMO | Admitting: *Deleted

## 2018-10-21 DIAGNOSIS — I495 Sick sinus syndrome: Secondary | ICD-10-CM | POA: Diagnosis not present

## 2018-10-21 DIAGNOSIS — I480 Paroxysmal atrial fibrillation: Secondary | ICD-10-CM

## 2018-10-22 ENCOUNTER — Telehealth: Payer: Self-pay

## 2018-10-22 NOTE — Telephone Encounter (Signed)
Spoke with patient to remind of missed remote transmission 

## 2018-10-26 LAB — CUP PACEART REMOTE DEVICE CHECK
Battery Remaining Longevity: 47 mo
Battery Voltage: 2.98 V
Brady Statistic AP VP Percent: 0.82 %
Brady Statistic AP VS Percent: 98.37 %
Brady Statistic AS VP Percent: 0.01 %
Brady Statistic AS VS Percent: 0.79 %
Brady Statistic RA Percent Paced: 99.07 %
Brady Statistic RV Percent Paced: 0.84 %
Date Time Interrogation Session: 20200613145240
Implantable Lead Implant Date: 20141202
Implantable Lead Implant Date: 20141202
Implantable Lead Location: 753859
Implantable Lead Location: 753860
Implantable Lead Model: 5076
Implantable Lead Model: 5076
Implantable Pulse Generator Implant Date: 20141202
Lead Channel Impedance Value: 361 Ohm
Lead Channel Impedance Value: 380 Ohm
Lead Channel Impedance Value: 437 Ohm
Lead Channel Impedance Value: 494 Ohm
Lead Channel Pacing Threshold Amplitude: 0.75 V
Lead Channel Pacing Threshold Amplitude: 1.375 V
Lead Channel Pacing Threshold Pulse Width: 0.4 ms
Lead Channel Pacing Threshold Pulse Width: 0.4 ms
Lead Channel Sensing Intrinsic Amplitude: 15.75 mV
Lead Channel Sensing Intrinsic Amplitude: 15.75 mV
Lead Channel Sensing Intrinsic Amplitude: 3.625 mV
Lead Channel Sensing Intrinsic Amplitude: 3.625 mV
Lead Channel Setting Pacing Amplitude: 2 V
Lead Channel Setting Pacing Amplitude: 3 V
Lead Channel Setting Pacing Pulse Width: 0.4 ms
Lead Channel Setting Sensing Sensitivity: 0.9 mV

## 2018-10-30 NOTE — Progress Notes (Signed)
Remote pacemaker transmission.   

## 2018-11-03 ENCOUNTER — Other Ambulatory Visit: Payer: Self-pay | Admitting: *Deleted

## 2018-11-03 DIAGNOSIS — Z20822 Contact with and (suspected) exposure to covid-19: Secondary | ICD-10-CM

## 2018-11-09 LAB — NOVEL CORONAVIRUS, NAA: SARS-CoV-2, NAA: NOT DETECTED

## 2018-11-11 ENCOUNTER — Other Ambulatory Visit: Payer: Self-pay | Admitting: *Deleted

## 2018-11-11 NOTE — Telephone Encounter (Signed)
Waiting for Dr C okay to [place] plus Eliquis

## 2018-11-12 MED ORDER — DABIGATRAN ETEXILATE MESYLATE 150 MG PO CAPS: 150.0000 mg | ORAL_CAPSULE | Freq: Two times a day (BID) | ORAL | 2 refills | Status: DC

## 2018-11-12 NOTE — Telephone Encounter (Signed)
Eliquis discontinued d/t DDI with Xtandi.  Pradaxa 150mg  BID started on 11/12/2018.  Scr = 1.76 85yo Male Dx Afib Hx of stoke  CrCl (adj body weight) = 69ml/min

## 2018-11-18 ENCOUNTER — Ambulatory Visit: Payer: Medicare HMO

## 2018-11-27 ENCOUNTER — Other Ambulatory Visit: Payer: Self-pay

## 2018-12-16 ENCOUNTER — Telehealth: Payer: Self-pay

## 2018-12-17 ENCOUNTER — Other Ambulatory Visit: Payer: Self-pay

## 2018-12-17 NOTE — Patient Outreach (Signed)
[facility]) Care Management  12/17/2018  KEMONTE ULLMAN [birth year] [id number]   Telephone Screen  Referral Date: 12/17/2018 Referral Source: EMMI-Prevent Call Referral Reason: " patient engagement score 9, HTN,CA" Insurance: [employer]   Outreach attempt #1 to patient. No answer. RN CM left HIPAA compliant voicemail message along with contact info.      Plan: RN CM will make outreach attempt to patient within 3-4 business days. RN CM will send unsuccessful outreach letter to patient.   Enzo Montgomery, RN,BSN,CCM [place] Management Telephonic Care Management Coordinator Direct Phone: [phone] Toll Free: [phone] Fax: [phone]

## 2018-12-19 ENCOUNTER — Other Ambulatory Visit: Payer: Self-pay

## 2018-12-19 NOTE — Patient Outreach (Signed)
[facility]) Care Management  12/19/2018  [name] 10/17/1932 [id number]   Telephone Screen  Referral Date: 12/17/2018 Referral Source: EMMI-Prevent Call Referral Reason: " patient engagement score 9, HTN,CA" Insurance: [employer]   Outreach attempt #2 to patient. Spoke with spouse who voiced that patient was still asleep. Advised that RN CM would call back at another time.       Plan: RN CM will make outreach attempt to patient within 3-4 business days.    Enzo Montgomery, RN,BSN,CCM [place] Management Telephonic Care Management Coordinator Direct Phone: [phone] Toll Free: [phone] Fax: [phone]

## 2018-12-23 ENCOUNTER — Other Ambulatory Visit: Payer: Self-pay

## 2018-12-23 NOTE — Patient Outreach (Signed)
[facility]) Care Management  12/23/2018  [name] May 30, 1932 [id number]   Telephone Screen  Referral Date:12/17/2018 Referral Source:EMMI-Prevent Call Referral Reason:" patient engagement score 9, HTN,CA" Insurance:[employer]   Outreach attempt #3 to patient. No answer at present.     Plan: RN CM will close case if no response from letter mailed to patient.  Enzo Montgomery, RN,BSN,CCM [place] Management Telephonic Care Management Coordinator Direct Phone: [phone] Toll Free: [phone] Fax: [phone]

## 2018-12-30 ENCOUNTER — Other Ambulatory Visit: Payer: Self-pay | Admitting: Family Medicine

## 2018-12-31 ENCOUNTER — Other Ambulatory Visit: Payer: Self-pay

## 2018-12-31 NOTE — Patient Outreach (Signed)
[facility]) Care Management  12/31/2018  JACEN CARLINI February 07, 1933 [id number]   Telephone Screen  Referral Date:12/17/2018 Referral Source:EMMI-Prevent Call Referral Reason:" patient engagement score 9, HTN,CA" Insurance:[employer]   Multiple attempts to establish contact with patient without success. No response from letter mailed to patient. Case is being closed at this time.     Plan: RN CM will close case at this time.   Enzo Montgomery, RN,BSN,CCM [place] Management Telephonic Care Management Coordinator Direct Phone: [phone] Toll Free: [phone] Fax: [phone]

## 2019-01-08 ENCOUNTER — Telehealth: Payer: Self-pay

## 2019-01-08 NOTE — Telephone Encounter (Signed)
33m 94kg Scr 1.76 10/14/18 Lovw/croitoru 07/22/18  This encounter was created in error - please disregard.

## 2019-01-08 NOTE — Telephone Encounter (Signed)
Called and lmomed the pt to ask if they were taking the eliquis or the pradaxa and to call us back and let us know so that we refill the correct med

## 2019-01-09 MED ORDER — DABIGATRAN ETEXILATE MESYLATE 150 MG PO CAPS: 150.0000 mg | ORAL_CAPSULE | Freq: Two times a day (BID) | ORAL | 2 refills | Status: DC

## 2019-01-09 NOTE — Addendum Note (Signed)
Addended by: Harrington Challenger on: 01/09/2019 04:37 PM   Modules accepted: Orders

## 2019-01-09 NOTE — Telephone Encounter (Signed)
Rx re-sent to [facility]. Patient will call back if problems.

## 2019-01-09 NOTE — Telephone Encounter (Signed)
Talked to patient's wife today.   Patient never received Pradaxa as ordered by Cardiologist.

## 2019-01-13 ENCOUNTER — Telehealth: Payer: Self-pay | Admitting: Cardiovascular Disease

## 2019-01-13 NOTE — Telephone Encounter (Signed)
Wife of patient called. [facility] will be faxing paperwork to Dr. Lurline Del office on behalf of the patient. The patient is trying to qualify for a tier exemption or a grant to help with the cost of his medication.  Please discuss with the wife if anything else is needed to be done. You can also call the wife on her cell phone  [phone] if they are not home

## 2019-01-13 NOTE — Telephone Encounter (Signed)
FYI, thank you.

## 2019-01-21 ENCOUNTER — Other Ambulatory Visit: Payer: Self-pay | Admitting: Family Medicine

## 2019-01-21 ENCOUNTER — Encounter: Payer: Medicare HMO | Admitting: *Deleted

## 2019-01-21 NOTE — Telephone Encounter (Signed)
Requested medication (s) are due for refill today: yes  Requested medication (s) are on the active medication list: yes   Future visit scheduled: no  Notes to clinic: review for refill   Requested Prescriptions  Pending Prescriptions Disp Refills   metoprolol tartrate (LOPRESSOR) 50 MG tablet 180 tablet 3    Sig: Take 1 tablet (50 mg total) by mouth 2 (two) times daily.     Cardiovascular:  Beta Blockers Failed - 01/21/2019 12:01 PM      Failed - Last BP in normal range    BP Readings from Last 1 Encounters:  03/12/18 (!) 150/88         Passed - Last Heart Rate in normal range    Pulse Readings from Last 1 Encounters:  03/12/18 76         Passed - Valid encounter within last 6 months    Recent Outpatient Visits          4 months ago Hypercalcemia   [facility] at [place], [place]   10 months ago Essential hypertension   [facility] at [facility], Alinda Sierras, MD   11 months ago Essential hypertension   [facility]rap[facility] at [facility], Alinda Sierras, MD   1 year ago Arthralgia, unspecified joint   [place] at [facility], Alinda Sierras, MD   1 year ago Hypercalcemia   Therapist, [facility] at [facility], Alinda Sierras, MD              losartan (COZAAR) 50 MG tablet 90 tablet 3    Sig: Take 1 tablet (50 mg total) by mouth daily.     Cardiovascular:  Angiotensin Receptor Blockers Failed - 01/21/2019 12:01 PM      Failed - Cr in normal range and within 180 days    Creat  Date Value Ref Range Status  04/06/2013 1.98 (H) 0.50 - 1.35 mg/dL Final   Creatinine, Ser  Date Value Ref Range Status  10/14/2018 1.76 (H) 0.40 - 1.50 mg/dL Final         Failed - Last BP in normal range    BP Readings from Last 1 Encounters:  03/12/18 (!) 150/88         Passed - K in normal range and within 180 days    Potassium  Date Value Ref Range Status  10/14/2018 4.4 3.5 - 5.1 mEq/L Final         Passed - Patient is not  pregnant      Passed - Valid encounter within last 6 months    Recent Outpatient Visits          4 months ago Hypercalcemia   Therapist, [facility] at [facility], Alinda Sierras, MD   10 months ago Essential hypertension   Therapist, [facility] at [facility], Alinda Sierras, MD   11 months ago Essential hypertension   Therapist, [facility] at [facility], Alinda Sierras, MD   1 year ago Arthralgia, unspecified joint   [place] at [facility], Alinda Sierras, MD   1 year ago Hypercalcemia   Therapist, music at [facility], Alinda Sierras, MD

## 2019-01-21 NOTE — Telephone Encounter (Signed)
Medication Refill - Medication: losartan (COZAAR) 50 MG tablet  metoprolol tartrate (LOPRESSOR) 50 MG tablet   Has the patient contacted their pharmacy? Yes.   (Agent: If no, request that the patient contact the pharmacy for the refill.) (Agent: If yes, when and what did the pharmacy advise?) [facility], [place] - [address]  [address] [place] [address]  Phone: [phone] Fax: [phone]    Preferred Pharmacy (with phone number or street name):   Agent: Please be advised that RX refills may take up to 3 business days. We ask that you follow-up with your pharmacy.

## 2019-01-22 MED ORDER — METOPROLOL TARTRATE 50 MG PO TABS: 50.0000 mg | ORAL_TABLET | Freq: Two times a day (BID) | ORAL | 1 refills | Status: DC

## 2019-01-22 MED ORDER — LOSARTAN POTASSIUM 50 MG PO TABS: 50.0000 mg | ORAL_TABLET | Freq: Every day | ORAL | 1 refills | Status: DC

## 2019-01-27 NOTE — Telephone Encounter (Signed)
Patient called back to advise that we have not received any paperwork for Pradaxa. He stated that they will call [employer] back.

## 2019-01-27 NOTE — Telephone Encounter (Signed)
No, we usually get it right to you if it comes to [place]

## 2019-01-29 NOTE — Telephone Encounter (Signed)
[name] - you may need to call [employer] and try the tier exception on the phone or get them to send another form

## 2019-01-29 NOTE — Telephone Encounter (Signed)
He needs to be on one or the other. I do not know/recall why the transition to pradaxa was recommended.

## 2019-01-29 NOTE — Telephone Encounter (Signed)
New Message   Patient's wife calling in wanting to know if the situation with medication has been worked out.  Please call back to advise.

## 2019-01-29 NOTE — Telephone Encounter (Signed)
Spoke to the patient's wife and she was informed that the office has not received a tier exception form. We will call [employer] to get this taken care of.  In the meantime, the patient has not received nor started the Pradaxa due to the price. His wife would like to know if he should be back on the Eliquis.

## 2019-01-30 MED ORDER — APIXABAN 2.5 MG PO TABS: 2.5000 mg | ORAL_TABLET | Freq: Two times a day (BID) | ORAL | 0 refills | Status: DC

## 2019-01-30 NOTE — Telephone Encounter (Signed)
Spoke with the wife. The patient never received the pradaxa and is currently out of Eliquis. Eliquis 2.5 mg bid has been sent in until the Pradaxa has been resolved.

## 2019-02-04 ENCOUNTER — Other Ambulatory Visit: Payer: Self-pay

## 2019-02-04 MED ORDER — ALBUTEROL SULFATE HFA 108 (90 BASE) MCG/ACT IN AERS: 2.0000 | INHALATION_SPRAY | Freq: Four times a day (QID) | RESPIRATORY_TRACT | 3 refills | Status: DC | PRN

## 2019-02-05 NOTE — Telephone Encounter (Signed)
Left a message for the patient to call back to discuss staying on the Eliquis verses the Pradaxa.

## 2019-02-09 NOTE — Telephone Encounter (Signed)
The patient and his wife have agreed to stay on Eliquis 2.5 mg bid. The Pradaxa is not affordable for them at this time. Per Dr. Sallyanne Kuster this would be okay.

## 2019-02-17 ENCOUNTER — Telehealth: Payer: Self-pay | Admitting: Family Medicine

## 2019-02-17 NOTE — Telephone Encounter (Signed)
Pt wants to get a Cortizone shot for his right knee and wants to make sure that would be ok with the medications he is on and if Dr. Elease Hashimoto can give the injection or does he have to go to an orthopedic for it/ please advise

## 2019-02-18 NOTE — Telephone Encounter (Signed)
We can do steroid injections of knee.  He does has some increased risk of bleeding secondary to the Eliquis- but overall risk fairly low.

## 2019-02-20 NOTE — Telephone Encounter (Signed)
Pt was called and wife answered, pt not home. Pt was scheduled for MD appt and flu shot appt was cancelled. Pts wife agreed with plan and verbalized understanding

## 2019-02-24 ENCOUNTER — Ambulatory Visit (INDEPENDENT_AMBULATORY_CARE_PROVIDER_SITE_OTHER): Payer: Medicare HMO | Admitting: Family Medicine

## 2019-02-24 ENCOUNTER — Ambulatory Visit: Payer: Medicare HMO

## 2019-02-24 ENCOUNTER — Ambulatory Visit (INDEPENDENT_AMBULATORY_CARE_PROVIDER_SITE_OTHER): Payer: Medicare HMO

## 2019-02-24 ENCOUNTER — Encounter: Payer: Self-pay | Admitting: Family Medicine

## 2019-02-24 ENCOUNTER — Other Ambulatory Visit: Payer: Self-pay

## 2019-02-24 VITALS — BP 118/84 | HR 82 | Temp 97.3°F | Resp 18 | Ht 71.0 in | Wt 204.4 lb

## 2019-02-24 DIAGNOSIS — G8929 Other chronic pain: Secondary | ICD-10-CM

## 2019-02-24 DIAGNOSIS — I480 Paroxysmal atrial fibrillation: Secondary | ICD-10-CM

## 2019-02-24 DIAGNOSIS — M1712 Unilateral primary osteoarthritis, left knee: Secondary | ICD-10-CM | POA: Diagnosis not present

## 2019-02-24 DIAGNOSIS — I10 Essential (primary) hypertension: Secondary | ICD-10-CM | POA: Diagnosis not present

## 2019-02-24 DIAGNOSIS — Z23 Encounter for immunization: Secondary | ICD-10-CM | POA: Diagnosis not present

## 2019-02-24 DIAGNOSIS — M25562 Pain in left knee: Secondary | ICD-10-CM | POA: Diagnosis not present

## 2019-02-24 MED ORDER — DICLOFENAC SODIUM 1 % TD GEL: 4.0000 g | Freq: Four times a day (QID) | TRANSDERMAL | 2 refills | Status: DC

## 2019-02-24 NOTE — Progress Notes (Signed)
Subjective:     Patient ID: [name], male   DOB: May 16, 1932, 85 y.o.   MRN: [id number]  HPI   [name] seen for progressive left knee pain for the past 3 to 4 months.  He has early morning stiffness and pain this does get some better with movement.  Denies any recent injury.  No visible swelling.  No warmth.  No erythema.  Pain is somewhat poorly localized but mostly medially.  He does have some lateral pain as well.  No locking or giving way.  Pain is moderate.  They had initially called requesting injection.  He apparently had right knee injection several years ago with good results.  He has multiple chronic medical problems.  Does have history of metastatic prostate cancer.  He is on Eliquis for A. fib history.  Past Medical History:  Diagnosis Date  . Disturbances of sulphur-bearing amino-acid metabolism   . Malignant neoplasm of prostate ([place])   . Obstructive sleep apnea (adult) (pediatric) 2004   surgery to coorrect  . Osteoarthrosis, unspecified whether generalized or localized, unspecified site   . Other and unspecified hyperlipidemia   . Other specified cardiac dysrhythmias(427.89)   . 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 Paroxysmal atrial fibrillation ([facility])   . Unspecified cerebral artery occlusion with cerebral infarction   . Unspecified disorder resulting from impaired renal function   . Unspecified essential hypertension   . Wears glasses   . Wears partial dentures    upper partial   Past Surgical History:  Procedure Laterality Date  . APPLICATION OF A-CELL OF EXTREMITY Right 08/12/2013   Procedure: APPLICATION OF A-CELL OF EXTREMITY;  Surgeon: Theodoro Kos, DO;  Location: [facility];  Service: Plastics;  Laterality: Right;  . CHOLECYSTECTOMY  1987  . COLONOSCOPY    . ent surgery  2004   correct snoring  . INGUINAL HERNIA REPAIR     left  . INSERT / REPLACE / REMOVE PACEMAKER  04/14/2013   dual chamber   / Dr Sallyanne Kuster  .  MINOR IRRIGATION AND DEBRIDEMENT OF WOUND Right 03/04/2013   Procedure: MINOR IRRIGATION AND DEBRIDEMENT OF WOUND;  Surgeon: Theodoro Kos, DO;  Location: [facility];  Service: Plastics;  Laterality: Right;  . ORCHIECTOMY  2002  . PERMANENT PACEMAKER INSERTION N/A 04/14/2013   Procedure: PERMANENT PACEMAKER INSERTION;  Surgeon: [name], MD;  Location: [facility];  Service: Cardiovascular;  Laterality: N/A;  . SKIN SPLIT GRAFT Right 08/12/2013   Procedure: SKIN GRAFT SPLIT THICKNESS WITH PLACEMENT OF VAC TO RIGHT LOWER LEG/PLACEMENT OF ACELL TO RIGHT UPPER THIGH AREA (HARVEST SITE);  Surgeon: Theodoro Kos, DO;  Location: [facility];  Service: Plastics;  Laterality: Right;    reports that he has been smoking cigarettes. He has a 15.00 pack-year smoking history. He has never used smokeless tobacco. He reports that he does not drink alcohol or use drugs. family history includes Prostate cancer in his father. No Known Allergies   Review of Systems  Constitutional: Negative for chills and fever.  Respiratory: Negative for cough.   Cardiovascular: Negative for chest pain.  Gastrointestinal: Negative for abdominal pain.  Genitourinary: Negative for dysuria.  Musculoskeletal: Positive for arthralgias.  Neurological: Negative for dizziness and headaches.  Hematological: Negative for adenopathy.       Objective:   Physical Exam Constitutional:      Appearance: Normal appearance.  Cardiovascular:     Rate and Rhythm: Normal rate.  Pulmonary:     Effort: Pulmonary effort is normal.     Breath sounds: Normal breath sounds.  Musculoskeletal:     Right lower leg: No edema.     Left lower leg: No edema.     Comments: Left knee reveals no effusion.  No warmth.  No erythema.  He has good range of motion but does have some stiffness with flexion and extension.  Mild medial joint line tenderness.  Neurological:     Mental Status: He is alert.         Assessment:     #1 progressive left knee pain.  Suspect degenerative arthritis.  He is not a candidate for oral nonsteroidals with his age and Eliquis therapy  #2 hypertension stable and at goal  #3 atrial fibrillation treated with Eliquis and rate controlled    Plan:     -Check x-rays left knee-does have some degenerative changes but not severe.  X-rays to be over read -We discussed possible steroid injection to the knee although he is high risk because of his Eliquis therapy.  He decided against this. -We agreed to trial of diclofenac 1% gel to use 3 times daily.  He is aware of cautions with even topical non-steroidals -Flu vaccine given  Joseph Post MD [facility]

## 2019-02-24 NOTE — Patient Instructions (Signed)

## 2019-02-27 ENCOUNTER — Other Ambulatory Visit: Payer: Self-pay | Admitting: Pharmacist

## 2019-02-27 MED ORDER — APIXABAN 2.5 MG PO TABS: 2.5000 mg | ORAL_TABLET | Freq: Two times a day (BID) | ORAL | 0 refills | Status: DC

## 2019-03-03 ENCOUNTER — Other Ambulatory Visit: Payer: Self-pay | Admitting: Pharmacist

## 2019-03-03 MED ORDER — APIXABAN 2.5 MG PO TABS: 2.5000 mg | ORAL_TABLET | Freq: Two times a day (BID) | ORAL | 0 refills | Status: DC

## 2019-03-03 NOTE — Telephone Encounter (Signed)
Eliquis therapy okay by Dr Sallyanne Kuster

## 2019-03-05 DIAGNOSIS — C61 Malignant neoplasm of prostate: Secondary | ICD-10-CM | POA: Diagnosis not present

## 2019-03-11 DIAGNOSIS — C7951 Secondary malignant neoplasm of bone: Secondary | ICD-10-CM | POA: Diagnosis not present

## 2019-03-11 DIAGNOSIS — C61 Malignant neoplasm of prostate: Secondary | ICD-10-CM | POA: Diagnosis not present

## 2019-03-18 ENCOUNTER — Emergency Department (HOSPITAL_COMMUNITY): Payer: Medicare HMO

## 2019-03-18 ENCOUNTER — Other Ambulatory Visit: Payer: Self-pay

## 2019-03-18 ENCOUNTER — Observation Stay (HOSPITAL_BASED_OUTPATIENT_CLINIC_OR_DEPARTMENT_OTHER): Payer: Medicare HMO

## 2019-03-18 ENCOUNTER — Observation Stay (HOSPITAL_COMMUNITY)
Admission: EM | Admit: 2019-03-18 | Discharge: 2019-03-20 | Disposition: A | Discharge status: Home / Self Care | Payer: Medicare HMO | Attending: Internal Medicine | Admitting: Internal Medicine

## 2019-03-18 DIAGNOSIS — F17210 Nicotine dependence, cigarettes, uncomplicated: Secondary | ICD-10-CM | POA: Diagnosis not present

## 2019-03-18 DIAGNOSIS — E1122 Type 2 diabetes mellitus with diabetic chronic kidney disease: Secondary | ICD-10-CM | POA: Diagnosis not present

## 2019-03-18 DIAGNOSIS — I639 Cerebral infarction, unspecified: Secondary | ICD-10-CM | POA: Diagnosis not present

## 2019-03-18 DIAGNOSIS — Z6830 Body mass index (BMI) 30.0-30.9, adult: Secondary | ICD-10-CM | POA: Insufficient documentation

## 2019-03-18 DIAGNOSIS — I495 Sick sinus syndrome: Secondary | ICD-10-CM | POA: Insufficient documentation

## 2019-03-18 DIAGNOSIS — N1832 Chronic kidney disease, stage 3b: Secondary | ICD-10-CM | POA: Insufficient documentation

## 2019-03-18 DIAGNOSIS — I480 Paroxysmal atrial fibrillation: Secondary | ICD-10-CM | POA: Insufficient documentation

## 2019-03-18 DIAGNOSIS — Z79899 Other long term (current) drug therapy: Secondary | ICD-10-CM | POA: Diagnosis not present

## 2019-03-18 DIAGNOSIS — I129 Hypertensive chronic kidney disease with stage 1 through stage 4 chronic kidney disease, or unspecified chronic kidney disease: Secondary | ICD-10-CM | POA: Insufficient documentation

## 2019-03-18 DIAGNOSIS — N183 Chronic kidney disease, stage 3 unspecified: Secondary | ICD-10-CM | POA: Diagnosis present

## 2019-03-18 DIAGNOSIS — E785 Hyperlipidemia, unspecified: Secondary | ICD-10-CM | POA: Insufficient documentation

## 2019-03-18 DIAGNOSIS — Z7982 Long term (current) use of aspirin: Secondary | ICD-10-CM | POA: Insufficient documentation

## 2019-03-18 DIAGNOSIS — Z8042 Family history of malignant neoplasm of prostate: Secondary | ICD-10-CM | POA: Insufficient documentation

## 2019-03-18 DIAGNOSIS — Z20828 Contact with and (suspected) exposure to other viral communicable diseases: Secondary | ICD-10-CM | POA: Insufficient documentation

## 2019-03-18 DIAGNOSIS — I340 Nonrheumatic mitral (valve) insufficiency: Secondary | ICD-10-CM | POA: Diagnosis not present

## 2019-03-18 DIAGNOSIS — E669 Obesity, unspecified: Secondary | ICD-10-CM | POA: Insufficient documentation

## 2019-03-18 DIAGNOSIS — I6782 Cerebral ischemia: Secondary | ICD-10-CM | POA: Insufficient documentation

## 2019-03-18 DIAGNOSIS — I351 Nonrheumatic aortic (valve) insufficiency: Secondary | ICD-10-CM | POA: Insufficient documentation

## 2019-03-18 DIAGNOSIS — Z794 Long term (current) use of insulin: Secondary | ICD-10-CM | POA: Insufficient documentation

## 2019-03-18 DIAGNOSIS — Z9079 Acquired absence of other genital organ(s): Secondary | ICD-10-CM | POA: Insufficient documentation

## 2019-03-18 DIAGNOSIS — R4789 Other speech disturbances: Secondary | ICD-10-CM | POA: Insufficient documentation

## 2019-03-18 DIAGNOSIS — G4733 Obstructive sleep apnea (adult) (pediatric): Secondary | ICD-10-CM | POA: Insufficient documentation

## 2019-03-18 DIAGNOSIS — Z8673 Personal history of transient ischemic attack (TIA), and cerebral infarction without residual deficits: Secondary | ICD-10-CM | POA: Insufficient documentation

## 2019-03-18 DIAGNOSIS — Z791 Long term (current) use of non-steroidal anti-inflammatories (NSAID): Secondary | ICD-10-CM | POA: Diagnosis not present

## 2019-03-18 DIAGNOSIS — I672 Cerebral atherosclerosis: Secondary | ICD-10-CM | POA: Diagnosis not present

## 2019-03-18 DIAGNOSIS — E7800 Pure hypercholesterolemia, unspecified: Secondary | ICD-10-CM | POA: Diagnosis not present

## 2019-03-18 DIAGNOSIS — Z8546 Personal history of malignant neoplasm of prostate: Secondary | ICD-10-CM | POA: Insufficient documentation

## 2019-03-18 DIAGNOSIS — R29810 Facial weakness: Secondary | ICD-10-CM | POA: Diagnosis not present

## 2019-03-18 DIAGNOSIS — R29818 Other symptoms and signs involving the nervous system: Secondary | ICD-10-CM | POA: Diagnosis not present

## 2019-03-18 DIAGNOSIS — M1990 Unspecified osteoarthritis, unspecified site: Secondary | ICD-10-CM | POA: Insufficient documentation

## 2019-03-18 DIAGNOSIS — Z7901 Long term (current) use of anticoagulants: Secondary | ICD-10-CM | POA: Diagnosis not present

## 2019-03-18 DIAGNOSIS — R531 Weakness: Secondary | ICD-10-CM | POA: Insufficient documentation

## 2019-03-18 DIAGNOSIS — Z950 Presence of cardiac pacemaker: Secondary | ICD-10-CM | POA: Insufficient documentation

## 2019-03-18 DIAGNOSIS — Z9049 Acquired absence of other specified parts of digestive tract: Secondary | ICD-10-CM | POA: Diagnosis not present

## 2019-03-18 DIAGNOSIS — R4781 Slurred speech: Secondary | ICD-10-CM | POA: Diagnosis not present

## 2019-03-18 DIAGNOSIS — R4701 Aphasia: Secondary | ICD-10-CM | POA: Diagnosis not present

## 2019-03-18 DIAGNOSIS — R739 Hyperglycemia, unspecified: Secondary | ICD-10-CM

## 2019-03-18 DIAGNOSIS — E041 Nontoxic single thyroid nodule: Secondary | ICD-10-CM | POA: Insufficient documentation

## 2019-03-18 DIAGNOSIS — I10 Essential (primary) hypertension: Secondary | ICD-10-CM | POA: Diagnosis present

## 2019-03-18 DIAGNOSIS — G459 Transient cerebral ischemic attack, unspecified: Secondary | ICD-10-CM | POA: Diagnosis not present

## 2019-03-18 DIAGNOSIS — I63233 Cerebral infarction due to unspecified occlusion or stenosis of bilateral carotid arteries: Secondary | ICD-10-CM | POA: Diagnosis not present

## 2019-03-18 LAB — CBC
HCT: 43.4 % (ref 39.0–52.0)
Hemoglobin: 13.6 g/dL (ref 13.0–17.0)
MCH: 30.1 pg (ref 26.0–34.0)
MCHC: 31.3 g/dL (ref 30.0–36.0)
MCV: 96 fL (ref 80.0–100.0)
Platelets: 184 10*3/uL (ref 150–400)
RBC: 4.52 MIL/uL (ref 4.22–5.81)
RDW: 14.5 % (ref 11.5–15.5)
WBC: 9.9 10*3/uL (ref 4.0–10.5)
nRBC: 0 % (ref 0.0–0.2)

## 2019-03-18 LAB — HEMOGLOBIN A1C
Hgb A1c MFr Bld: 6.2 % — ABNORMAL HIGH (ref 4.8–5.6)
Mean Plasma Glucose: 131.24 mg/dL

## 2019-03-18 LAB — APTT: aPTT: 28 seconds (ref 24–36)

## 2019-03-18 LAB — COMPREHENSIVE METABOLIC PANEL
ALT: 12 U/L (ref 0–44)
AST: 18 U/L (ref 15–41)
Albumin: 3.4 g/dL — ABNORMAL LOW (ref 3.5–5.0)
Alkaline Phosphatase: 128 U/L — ABNORMAL HIGH (ref 38–126)
Anion gap: 12 (ref 5–15)
BUN: 20 mg/dL (ref 8–23)
CO2: 20 mmol/L — ABNORMAL LOW (ref 22–32)
Calcium: 10.3 mg/dL (ref 8.9–10.3)
Chloride: 112 mmol/L — ABNORMAL HIGH (ref 98–111)
Creatinine, Ser: 2 mg/dL — ABNORMAL HIGH (ref 0.61–1.24)
GFR calc Af Amer: 35 mL/min — ABNORMAL LOW (ref >60)
GFR calc non Af Amer: 30 mL/min — ABNORMAL LOW (ref >60)
Glucose, Bld: 115 mg/dL — ABNORMAL HIGH (ref 70–99)
Potassium: 4.1 mmol/L (ref 3.5–5.1)
Sodium: 144 mmol/L (ref 135–145)
Total Bilirubin: 0.4 mg/dL (ref 0.3–1.2)
Total Protein: 6.5 g/dL (ref 6.5–8.1)

## 2019-03-18 LAB — DIFFERENTIAL
Abs Immature Granulocytes: 0.03 10*3/uL (ref 0.00–0.07)
Basophils Absolute: 0 10*3/uL (ref 0.0–0.1)
Basophils Relative: 0 %
Eosinophils Absolute: 0.3 10*3/uL (ref 0.0–0.5)
Eosinophils Relative: 3 %
Immature Granulocytes: 0 %
Lymphocytes Relative: 28 %
Lymphs Abs: 2.8 10*3/uL (ref 0.7–4.0)
Monocytes Absolute: 0.8 10*3/uL (ref 0.1–1.0)
Monocytes Relative: 9 %
Neutro Abs: 5.9 10*3/uL (ref 1.7–7.7)
Neutrophils Relative %: 60 %

## 2019-03-18 LAB — I-STAT CHEM 8, ED
BUN: 22 mg/dL (ref 8–23)
Calcium, Ion: 1.4 mmol/L (ref 1.15–1.40)
Chloride: 112 mmol/L — ABNORMAL HIGH (ref 98–111)
Creatinine, Ser: 2 mg/dL — ABNORMAL HIGH (ref 0.61–1.24)
Glucose, Bld: 110 mg/dL — ABNORMAL HIGH (ref 70–99)
HCT: 41 % (ref 39.0–52.0)
Hemoglobin: 13.9 g/dL (ref 13.0–17.0)
Potassium: 4 mmol/L (ref 3.5–5.1)
Sodium: 146 mmol/L — ABNORMAL HIGH (ref 135–145)
TCO2: 22 mmol/L (ref 22–32)

## 2019-03-18 LAB — PROTIME-INR
INR: 1.1 (ref 0.8–1.2)
Prothrombin Time: 14.1 seconds (ref 11.4–15.2)

## 2019-03-18 LAB — CBG MONITORING, ED
Glucose-Capillary: 101 mg/dL — ABNORMAL HIGH (ref 70–99)
Glucose-Capillary: 59 mg/dL — ABNORMAL LOW (ref 70–99)
Glucose-Capillary: 113 mg/dL — ABNORMAL HIGH (ref 70–99)

## 2019-03-18 LAB — ECHOCARDIOGRAM COMPLETE

## 2019-03-18 LAB — SARS CORONAVIRUS 2 (TAT 6-24 HRS): SARS Coronavirus 2: NEGATIVE

## 2019-03-18 MED ORDER — SODIUM CHLORIDE 0.9 % IV SOLN
INTRAVENOUS | Status: DC
  Administered 2019-03-19: 11:00:00 via INTRAVENOUS

## 2019-03-18 MED ORDER — SENNOSIDES-DOCUSATE SODIUM 8.6-50 MG PO TABS: 1.0000 | ORAL_TABLET | Freq: Every evening | ORAL | Status: DC | PRN

## 2019-03-18 MED ORDER — STROKE: EARLY STAGES OF RECOVERY BOOK: Freq: Once | Status: DC

## 2019-03-18 MED ORDER — ACETAMINOPHEN 325 MG PO TABS: 650.0000 mg | ORAL_TABLET | ORAL | Status: DC | PRN

## 2019-03-18 MED ORDER — ACETAMINOPHEN 160 MG/5ML PO SOLN: 650.0000 mg | ORAL | Status: DC | PRN

## 2019-03-18 MED ORDER — IOHEXOL 350 MG/ML SOLN
75.0000 mL | Freq: Once | INTRAVENOUS | Status: AC | PRN
  Administered 2019-03-18: 75 mL via INTRAVENOUS

## 2019-03-18 MED ORDER — INSULIN ASPART 100 UNIT/ML ~~LOC~~ SOLN: 0.0000 [IU] | Freq: Three times a day (TID) | SUBCUTANEOUS | Status: DC

## 2019-03-18 MED ORDER — ACETAMINOPHEN 650 MG RE SUPP: 650.0000 mg | RECTAL | Status: DC | PRN

## 2019-03-18 MED ORDER — INSULIN ASPART 100 UNIT/ML ~~LOC~~ SOLN: 0.0000 [IU] | Freq: Every day | SUBCUTANEOUS | Status: DC

## 2019-03-18 MED ORDER — SODIUM CHLORIDE 0.9% FLUSH
3.0000 mL | Freq: Once | INTRAVENOUS | Status: DC
Start: 2019-03-18 — End: 2019-03-20

## 2019-03-18 MED ORDER — ALBUTEROL SULFATE HFA 108 (90 BASE) MCG/ACT IN AERS
2.0000 | INHALATION_SPRAY | Freq: Four times a day (QID) | RESPIRATORY_TRACT | Status: DC | PRN
  Filled 2019-03-18: qty 6.7

## 2019-03-18 MED ORDER — APIXABAN 2.5 MG PO TABS
2.5000 mg | ORAL_TABLET | Freq: Two times a day (BID) | ORAL | Status: DC
  Administered 2019-03-19 – 2019-03-20 (×3): 2.5 mg via ORAL
  Filled 2019-03-18 (×6): qty 1
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
## 2019-03-18 NOTE — ED Notes (Signed)
Patient denies pain and is resting comfortably.  

## 2019-03-18 NOTE — Progress Notes (Signed)
  Echocardiogram 2D Echocardiogram has been performed.  Aharon Carriere G Navdeep Halt 03/18/2019, 4:17 PM

## 2019-03-18 NOTE — Code Documentation (Signed)
85yo male arriving to [facility] via [place] at 53. Patient from home where he was witnessed by his wife to have aphasia and right hemiplegia. LKW 1100. EMS called and activated a code stroke. Symptoms resolved en route around 1250. Stroke team at the bedside on patient arrival. Labs drawn and patient cleared for CT by Dr. Sherry Ruffing. Patient with h/o TIA. Patient on Eliquis, last dose at 0900. Patient to CT with team. CT completed followed by CTA head and neck. NIHSS 2, see documentation for details and code stroke times. Patient with bilateral leg drift on exam. Patient is contraindicated for treatment with tPA due to Eliquis dose this AM. No acute stroke treatment at this time. TIA alert. Bedside handoff with ED RN Crystal.

## 2019-03-18 NOTE — Consult Note (Signed)
Neurology Consultation  Reason for Consult: Code stroke for aphasia right-sided weakness Referring Physician: Dr. Rex Kras  CC: Aphasia right-sided weakness  History is obtained from: Patient, chart.  HPI: [name] is a 85 y.o. male past medical history of atrial fibrillation on Eliquis last dose taken at 9 AM today, tachybradycardia syndrome status post pacemaker, hypertension, hyperlipidemia, prostate cancer, presented to the emergency room for evaluation of sudden onset of inability to talk and right-sided weakness. Last known normal was 11 AM today.  He was watching TV when the symptoms happened.  Symptoms lasted about an hour and resolved in the ambulance. Does not report any similar symptoms in the past. Does not report lightheadedness or dizziness. No history of falls. No recent sickness or illness   LKW: 11 AM today tpa given?: no, nonfocal examination, low stroke scale Premorbid modified Rankin scale (mRS): 1  ROS: ROS was performed and is negative except as noted in the HPI.    Past Medical History:  Diagnosis Date  . Disturbances of sulphur-bearing amino-acid metabolism   . Malignant neoplasm of prostate ([place])   . Obstructive sleep apnea (adult) (pediatric) 2004   surgery to coorrect  . Osteoarthrosis, unspecified whether generalized or localized, unspecified site   . Other and unspecified hyperlipidemia   . Other specified cardiac dysrhythmias(427.89)   . 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 Paroxysmal atrial fibrillation ([facility])   . Unspecified cerebral artery occlusion with cerebral infarction   . Unspecified disorder resulting from impaired renal function   . Unspecified essential hypertension   . Wears glasses   . Wears partial dentures    upper partial    Family History  Problem Relation Age of Onset  . Prostate cancer Father     Social History:   reports that he has been smoking cigarettes. He has a 15.00 pack-year smoking history.  He has never used smokeless tobacco. He reports that he does not drink alcohol or use drugs.  Medications  Current Facility-Administered Medications:  .  sodium chloride flush (NS) 0.9 % injection 3 mL, 3 mL, Intravenous, Once, Little, Wenda Overland, MD  Current Outpatient Medications:  .  albuterol (VENTOLIN HFA) 108 (90 Base) MCG/ACT inhaler, Inhale 2 puffs into the lungs every 6 (six) hours as needed for wheezing or shortness of breath., Disp: 18 g, Rfl: 3 .  amLODipine (NORVASC) 5 MG tablet, TAKE 1 TABLET EVERY DAY, Disp: 90 tablet, Rfl: 1 .  apixaban (ELIQUIS) 2.5 MG TABS tablet, Take 1 tablet (2.5 mg total) by mouth 2 (two) times daily., Disp: 60 tablet, Rfl: 0 .  diclofenac sodium (VOLTAREN) 1 % GEL, Apply 4 g topically 4 (four) times daily., Disp: 100 g, Rfl: 2 .  losartan (COZAAR) 50 MG tablet, Take 1 tablet (50 mg total) by mouth daily., Disp: 90 tablet, Rfl: 1 .  metoprolol tartrate (LOPRESSOR) 50 MG tablet, Take 1 tablet (50 mg total) by mouth 2 (two) times daily., Disp: 180 tablet, Rfl: 1 .  Multiple Vitamin (MULTIVITAMIN WITH MINERALS) TABS tablet, Take 1 tablet by mouth daily., Disp: , Rfl:  .  simvastatin (ZOCOR) 40 MG tablet, Take 1 tablet (40 mg total) by mouth daily., Disp: 90 tablet, Rfl: 1 .  XTANDI 40 MG capsule, , Disp: , Rfl:   Exam: Current vital signs: There were no vitals taken for this visit. Vital signs in last 24 hours:   General: Awake alert no distress HEENT: Normocephalic atraumatic Neck: Clear Cardiovascular regular rhythm Lung: Soft  nondistended nontender Extremities warm well perfused Neurological exam Mental status: Awake alert oriented x3 No dysarthria No aphasia Cranial nerves: Pupils equal round react light, extraocular movements intact, visual field full, facial sensation intact, face symmetric, auditory acuity intact, tongue and palate midline, shoulder shrug intact. Motor exam: Antigravity without vertical drift in both upper extremities.   Antigravity with mild vertical drift in both lower extremities. Sensory exam: Intact to light touch without extinction Coordination: Intact finger-nose-finger and heel-knee-shin testing NIH stroke scale-2  (1 for each leg)   Labs I have reviewed labs in epic and the results pertinent to this consultation are:  CBC    Component Value Date/Time   WBC 9.9 03/18/2019 1308   RBC 4.52 03/18/2019 1308   HGB 13.9 03/18/2019 1315   HCT 41.0 03/18/2019 1315   PLT 184 03/18/2019 1308   MCV 96.0 03/18/2019 1308   MCH 30.1 03/18/2019 1308   MCHC 31.3 03/18/2019 1308   RDW 14.5 03/18/2019 1308   LYMPHSABS 2.8 03/18/2019 1308   MONOABS 0.8 03/18/2019 1308   EOSABS 0.3 03/18/2019 1308   BASOSABS 0.0 03/18/2019 1308    CMP     Component Value Date/Time   NA 146 (H) 03/18/2019 1315   K 4.0 03/18/2019 1315   CL 112 (H) 03/18/2019 1315   CO2 25 10/14/2018 0953   GLUCOSE 110 (H) 03/18/2019 1315   GLUCOSE 100 (H) 04/11/2006 0906   BUN 22 03/18/2019 1315   CREATININE 2.00 (H) 03/18/2019 1315   CREATININE 1.98 (H) 04/06/2013 1342   CALCIUM 10.1 10/14/2018 0953   PROT 6.3 10/14/2018 0953   ALBUMIN 3.7 10/14/2018 0953   AST 11 10/14/2018 0953   ALT 6 10/14/2018 0953   ALKPHOS 130 (H) 10/14/2018 0953   BILITOT 0.4 10/14/2018 0953   GFRNONAA 30 (L) 08/10/2013 1430   GFRAA 34 (L) 08/10/2013 1430    Imaging I have reviewed the images obtained:  CT-scan of the brain-no acute changes.  Aspects 10 CTA head and neck with significant intracranial atherosclerotic disease with multifocal stenosis moderate to severe paraclinoid left ICA, moderate stenosis of paraclinoid right ICA, moderate focal stenosis within the bilateral M1's, fetal origin left PCA with significant atherosclerotic irregularity of the P-comm.  Significant dominant right vertebral artery without flow-limiting stenosis.  Mixed plaques at the vertebral origins.  Incidental 2.2 left thyroid lobe nodule seen  Assessment: 85 year old  man with above past medical history with transient symptoms of aphasia and right-sided weakness very concerning for a left hemispheric TIA. Given the multifocal intracranial stenosis, he should be admitted for further work-up. Consideration should be given to adding antiplatelet to his anticoagulant regimen due to intracranial atherosclerosis.  tPA not given due to low stroke scale as well as nonfocal examination Not a candidate for EVT due to no emergent LVO.   Impression: Likely left hemispheric TIA  Recommendations: -Admit to Obs -Telemetry monitoring -Allow for permissive hypertension for the first 24-48h - only treat PRN if SBP >220 mmHg. Blood pressures can be gradually normalized to SBP<140 upon discharge. -MRI brain without contrast -Echocardiogram -HgbA1c, fasting lipid panel -Frequent neuro checks -Prophylactic therapy-c/w Eliquis. May need antiplatelet added due to intracranial stenoses-defer final decision to the stroke team. -Atorvastatin 80 mg PO daily -Risk factor modification -PT consult, OT consult, Speech consult -If Afib found on telemetry, will need anticoagulation. Decision pending imaging and stroke team rounding.  Please page stroke NP/PA/MD (listed on AMION)  from 8am-4 pm as this patient will be followed by the  stroke team at this point.  -- Amie Portland, MD [facility] Pager: [phone] If 7pm to 7am, please call on call as listed on AMION.  CRITICAL CARE ATTESTATION Performed by: Amie Portland, MD Total critical care time: 45 minutes Critical care time was exclusive of separately billable procedures and treating other patients and/or supervising APPs/Residents/Students Critical care was necessary to treat or prevent imminent or life-threatening deterioration due to sudden onset of transient neurological symptoms concerning for acute ischemic stroke or bleed given past history of being on anticoagulation requiring emergent management and  critical decision making. This patient is critically ill and at significant risk for neurological worsening and/or death and care requires constant monitoring. Critical care was time spent personally by me on the following activities: development of treatment plan with patient and/or surrogate as well as nursing, discussions with consultants, evaluation of patient's response to treatment, examination of patient, obtaining history from patient or surrogate, ordering and performing treatments and interventions, ordering and review of laboratory studies, ordering and review of radiographic studies, pulse oximetry, re-evaluation of patient's condition, participation in multidisciplinary rounds and medical decision making of high complexity in the care of this patient.

## 2019-03-18 NOTE — ED Notes (Signed)
Admitting paged regarding diet

## 2019-03-18 NOTE — ED Notes (Signed)
Echo at the bedside °

## 2019-03-18 NOTE — ED Triage Notes (Signed)
Pt bib ems from home after sudden onset aphasia and R sided weakness/paralysis at 11 am. En route pt symptoms began to resolve with complete resolution at approx 1250. VSS with EMS. 130/90, HR 68, 98% RA, CBG 160.

## 2019-03-18 NOTE — H&P (Addendum)
History and Physical    [name] [id number] DOB: March 06, 1933 DOA: 03/18/2019  PCP: [name], MD  Patient coming from: Home  I have personally briefly reviewed patient's old medical records in [place]  Chief Complaint: Slurred speech and right arm weakness  HPI: [name] is a 85 y.o. male with medical history significant of prostate cancer, obstructive sleep apnea, hyperlipidemia, hypertension, paroxysmal atrial fibrillation on Eliquis status post dual-chamber pacemaker who was brought into the ED with the concern of slurred speech and right arm weakness which started around 11 AM.  Due to concern of a stroke, his wife called EMS.  When EMS arrived at around 12:10 PM, his symptoms had already resolved.  Patient did not have any other complaint.  No seizure activity.  He was brought into the emergency department.  Currently he is slightly lethargic and generalized weak but does not have any complaint.  He is a speech is back to normal.  Wife is sitting at the bedside.  He has a history of prior stroke.  No recent travel or any sick contact or any COVID-19 exposure.  ED Course: Upon arrival to the ED, his blood pressure was 145/98.  Rest of the vitals were stable.  Renal function at baseline.  CBC within normal limits.  CT head/code stroke showed No acute intracranial hemorrhage or demarcated cortical infarction 2. Advanced chronic small vessel ischemic disease. Numerous lacunar infarcts within the bilateral basal ganglia and thalami, some of which appear chronic and others which are age indeterminate. Consider brain MRI for further evaluation Neurology was consulted and has been evaluated by them already.  They recommended admission for further work-up and MRI as well as CT angiogram.  Hospital service was consulted to admit the patient for further management.  Review of Systems: As per HPI otherwise negative.    Past Medical History:  Diagnosis Date     Disturbances of sulphur-bearing amino-acid metabolism    Malignant neoplasm of prostate ([place])    Obstructive sleep apnea (adult) (pediatric) 2004   surgery to coorrect   Osteoarthrosis, unspecified whether generalized or localized, unspecified site    Other and unspecified hyperlipidemia    Other specified cardiac dysrhythmias(427.89)    Pacemaker 04/14/2013   DUAL CHAMBER PACEMAKER   Paroxysmal atrial fibrillation ([place])    Unspecified cerebral artery occlusion with cerebral infarction    Unspecified disorder resulting from impaired renal function    Unspecified essential hypertension    Wears glasses    Wears partial dentures    upper partial    Past Surgical History:  Procedure Laterality Date   APPLICATION OF A-CELL OF EXTREMITY Right 08/12/2013   Procedure: APPLICATION OF A-CELL OF EXTREMITY;  Surgeon: Theodoro Kos, DO;  Location: [facility];  Service: Plastics;  Laterality: Right;   CHOLECYSTECTOMY  1987   COLONOSCOPY     ent surgery  2004   correct snoring   INGUINAL HERNIA REPAIR     left   INSERT / REPLACE / REMOVE PACEMAKER  04/14/2013   dual chamber   / Dr Sallyanne Kuster   MINOR IRRIGATION AND DEBRIDEMENT OF WOUND Right 03/04/2013   Procedure: MINOR IRRIGATION AND DEBRIDEMENT OF WOUND;  Surgeon: Theodoro Kos, DO;  Location: [facility];  Service: Plastics;  Laterality: Right;   ORCHIECTOMY  2002   PERMANENT PACEMAKER INSERTION N/A 04/14/2013   Procedure: PERMANENT PACEMAKER INSERTION;  Surgeon: [name], MD;  Location: [facility] CATH [facility];  Service: Cardiovascular;  Laterality: N/A;   SKIN SPLIT GRAFT Right 08/12/2013   Procedure: SKIN GRAFT SPLIT THICKNESS WITH PLACEMENT OF VAC TO RIGHT LOWER LEG/PLACEMENT OF ACELL TO RIGHT UPPER THIGH AREA (HARVEST SITE);  Surgeon: Theodoro Kos, DO;  Location: [facility];  Service: Plastics;  Laterality: Right;     reports that he has been smoking cigarettes. He has a 15.00  pack-year smoking history. He has never used smokeless tobacco. He reports that he does not drink alcohol or use drugs.  No Known Allergies  Family History  Problem Relation Age of Onset   Prostate cancer Father     Prior to Admission medications   Medication Sig Start Date End Date Taking? Authorizing Provider  albuterol (VENTOLIN HFA) 108 (90 Base) MCG/ACT inhaler Inhale 2 puffs into the lungs every 6 (six) hours as needed for wheezing or shortness of breath. 02/04/19  Yes Burchette, Alinda Sierras, MD  amLODipine (NORVASC) 5 MG tablet TAKE 1 TABLET EVERY DAY Patient taking differently: Take 5 mg by mouth at bedtime.  12/30/18  Yes Burchette, Alinda Sierras, MD  apixaban (ELIQUIS) 2.5 MG TABS tablet Take 1 tablet (2.5 mg total) by mouth 2 (two) times daily. 03/03/19  Yes Croitoru, Mihai, MD  diclofenac sodium (VOLTAREN) 1 % GEL Apply 4 g topically 4 (four) times daily. Patient taking differently: Apply 4 g topically 4 (four) times daily as needed (knee pain).  02/24/19  Yes Burchette, Alinda Sierras, MD  losartan (COZAAR) 50 MG tablet Take 1 tablet (50 mg total) by mouth daily. 01/22/19  Yes Burchette, Alinda Sierras, MD  metoprolol tartrate (LOPRESSOR) 50 MG tablet Take 1 tablet (50 mg total) by mouth 2 (two) times daily. 01/22/19  Yes Burchette, Alinda Sierras, MD  Multiple Vitamin (MULTIVITAMIN WITH MINERALS) TABS tablet Take 1 tablet by mouth daily.   Yes [provider]  simvastatin (ZOCOR) 40 MG tablet Take 1 tablet (40 mg total) by mouth daily. 12/30/18  Yes Burchette, Alinda Sierras, MD  XTANDI 40 MG capsule Take 160 mg by mouth daily.  11/13/17  Yes [provider]    Physical Exam: Vitals:   03/18/19 1415 03/18/19 1430 03/18/19 1443 03/18/19 1445  BP: (!) 147/82 (!) 145/96  (!) 158/98  Pulse: 65 60    Resp: 16 20  18   Temp:   97.6 F (36.4 C)   TempSrc:   Oral   SpO2: 100% 100%      Constitutional: NAD, calm, comfortable Vitals:   03/18/19 1415 03/18/19 1430 03/18/19 1443 03/18/19 1445  BP: (!)  147/82 (!) 145/96  (!) 158/98  Pulse: 65 60    Resp: 16 20  18   Temp:   97.6 F (36.4 C)   TempSrc:   Oral   SpO2: 100% 100%     Eyes: PERRL, lids and conjunctivae normal ENMT: Mucous membranes are moist. Posterior pharynx clear of any exudate or lesions.Normal dentition.  Neck: normal, supple, no masses, no thyromegaly Respiratory: clear to auscultation bilaterally, no wheezing, no crackles. Normal respiratory effort. No accessory muscle use.  Cardiovascular: Regular rate and rhythm, no murmurs / rubs / gallops. No extremity edema. 2+ pedal pulses. No carotid bruits.  Abdomen: no tenderness, no masses palpated. No hepatosplenomegaly. Bowel sounds positive.  Musculoskeletal: no clubbing / cyanosis. No joint deformity upper and lower extremities. Good ROM, no contractures. Normal muscle tone.  Skin: no rashes, lesions, ulcers. No induration Neurologic: CN 2-12 grossly intact. Sensation intact, DTR normal. Strength 4/5 in all 4 extremities. Psychiatric: Poor  judgment and insight.  Slightly lethargic but oriented x 3. Normal mood.    Labs on Admission: I have personally reviewed following labs and imaging studies  CBC: Recent Labs  Lab 03/18/19 1308 03/18/19 1315  WBC 9.9  --   NEUTROABS 5.9  --   HGB 13.6 13.9  HCT 43.4 41.0  MCV 96.0  --   PLT 184  --    Basic Metabolic Panel: Recent Labs  Lab 03/18/19 1308 03/18/19 1315  NA 144 146*  K 4.1 4.0  CL 112* 112*  CO2 20*  --   GLUCOSE 115* 110*  BUN 20 22  CREATININE 2.00* 2.00*  CALCIUM 10.3  --    GFR: CrCl cannot be calculated (Unknown ideal weight.). Liver Function Tests: Recent Labs  Lab 03/18/19 1308  AST 18  ALT 12  ALKPHOS 128*  BILITOT 0.4  PROT 6.5  ALBUMIN 3.4*   No results for input(s): LIPASE, AMYLASE in the last 168 hours. No results for input(s): AMMONIA in the last 168 hours. Coagulation Profile: Recent Labs  Lab 03/18/19 1308  INR 1.1   Cardiac Enzymes: No results for input(s):  CKTOTAL, CKMB, CKMBINDEX, TROPONINI in the last 168 hours. BNP (last 3 results) No results for input(s): PROBNP in the last 8760 hours. HbA1C: No results for input(s): HGBA1C in the last 72 hours. CBG: Recent Labs  Lab 03/18/19 1307  GLUCAP 101*   Lipid Profile: No results for input(s): CHOL, HDL, LDLCALC, TRIG, CHOLHDL, LDLDIRECT in the last 72 hours. Thyroid Function Tests: No results for input(s): TSH, T4TOTAL, FREET4, T3FREE, THYROIDAB in the last 72 hours. Anemia Panel: No results for input(s): VITAMINB12, FOLATE, FERRITIN, TIBC, IRON, RETICCTPCT in the last 72 hours. Urine analysis: No results found for: COLORURINE, APPEARANCEUR, [place], [place], GLUCOSEU, HGBUR, BILIRUBINUR, KETONESUR, PROTEINUR, UROBILINOGEN, NITRITE, LEUKOCYTESUR  Radiological Exams on Admission: Ct Angio Head W Or Wo Contrast  Result Date: 03/18/2019 CLINICAL DATA:  Stroke.  Right-sided weakness now resolved EXAM: CT ANGIOGRAPHY HEAD AND NECK TECHNIQUE: Multidetector CT imaging of the head and neck was performed using the standard protocol during bolus administration of intravenous contrast. Multiplanar CT image reconstructions and MIPs were obtained to evaluate the vascular anatomy. Carotid stenosis measurements (when applicable) are obtained utilizing NASCET criteria, using the distal internal carotid diameter as the denominator. CONTRAST:  18mL OMNIPAQUE IOHEXOL 350 MG/ML SOLN COMPARISON:  Brain MRI 06/11/2004, MRA head 06/11/2004 FINDINGS: CTA NECK FINDINGS Aortic arch: The left vertebral artery arises directly from the aortic arch. Visualized portions of the aortic arch demonstrate no evidence of dissection or aneurysm. Scattered soft and calcified plaque within the visualized aortic arch and proximal major branch vessels of the neck. Right carotid system: CCA and ICA patent within the neck without measurable stenosis. Mild soft and calcified plaque at the carotid bifurcation and within the proximal ICA. Left  carotid system: CCA and ICA patent within the neck without measurable stenosis. Mild mixed plaque at the carotid bifurcation. Vertebral arteries: The right vertebral artery is significantly dominant mixed plaque at the right vertebral artery origin with resultant mild ostial stenosis. Distal to this, the right vertebral artery is patent within the neck without significant stenosis (50% or greater). The non dominant left vertebral artery is markedly diminutive with atherosclerotic irregularity, but appears patent within the neck. Skeleton: Cervical spondylosis without high-grade bony spinal canal narrowing Other neck: 2.2 cm left thyroid lobe nodule. Otherwise, no soft tissue neck mass or pathologically enlarged cervical chain lymph nodes Upper chest: No consolidation  within the imaged lung apices. Review of the MIP images confirms the above findings CTA HEAD FINDINGS Anterior circulation: Calcified plaque within the intracranial right internal carotid artery. Moderate stenosis of the paraclinoid segment with otherwise no more than mild stenosis. Calcified plaque within the intracranial left internal carotid artery. Moderate/severe stenosis within the paraclinoid left ICA. The right middle cerebral artery is patent with atherosclerotic irregularity. Moderate focal stenosis within the mid to distal M1 right MCA. Atherosclerotic irregularity of the M2 and more distal right MCA branches without vessel occlusion or high-grade proximal stenosis identified. The left middle cerebral artery is patent with atherosclerotic irregularity. Moderate focal stenosis within the distal M1 left middle cerebral artery. Atherosclerotic irregularity of the M2 and more distal left MCA branches without vessel occlusion or high-grade proximal stenosis identified. The bilateral anterior cerebral arteries are patent without significant proximal stenosis. No intracranial aneurysm is identified. Posterior circulation: The dominant intracranial  right vertebral artery is patent with mild stenosis just proximal to the vertebrobasilar junction. The non dominant and diminutive intracranial left vertebral artery is patent with atherosclerotic irregularity and sites of up to moderate stenosis. The basilar artery is patent with mild atherosclerotic irregularity. Predominantly fetal origin of the right posterior cerebral artery which is patent. Moderate focal stenosis within the distal P2 right posterior cerebral artery. Predominantly fetal origin of the left posterior cerebral artery. There is significant atherosclerotic irregularity of the left posterior communicating artery. Moderate stenosis within the mid P2 left posterior cerebral artery. Venous sinuses: Within limitations of contrast timing, no convincing thrombus. Anatomic variants: As described Review of the MIP images confirms the above findings These results were called by telephone at the time of interpretation on 03/18/2019 at 1:32 pm to provider [facility] , who verbally acknowledged these results. IMPRESSION: CTA neck: 1. Common and internal carotid arteries patent within the neck without measurable stenosis. 2. Significant dominant right vertebral artery without flow-limiting stenosis. Mixed plaque results in mild ostial stenosis. 3. A non dominant and developmentally diminutive left vertebral artery demonstrates significant atherosclerotic irregularity but appears patent throughout the neck. 4. 2.2 cm left thyroid lobe nodule. Dedicated thyroid ultrasound recommended for further evaluation. CTA head: 1. Significant intracranial atherosclerotic disease with multifocal stenoses, most notably as follows. 2. Moderate/severe stenosis of the paraclinoid left ICA. 3. Moderate stenosis of the paraclinoid right ICA. 4. Moderate focal stenoses within the bilateral M1 middle cerebral arteries. 5. Predominantly fetal origin of the left PCA with significant atherosclerotic irregularity of the left posterior  communicating artery. 6. Additional mild and moderate stenoses as detailed. Electronically Signed   By: Kellie Simmering DO   On: 03/18/2019 14:07   Ct Angio Neck W Or Wo Contrast  Result Date: 03/18/2019 CLINICAL DATA:  Stroke.  Right-sided weakness now resolved EXAM: CT ANGIOGRAPHY HEAD AND NECK TECHNIQUE: Multidetector CT imaging of the head and neck was performed using the standard protocol during bolus administration of intravenous contrast. Multiplanar CT image reconstructions and MIPs were obtained to evaluate the vascular anatomy. Carotid stenosis measurements (when applicable) are obtained utilizing NASCET criteria, using the distal internal carotid diameter as the denominator. CONTRAST:  63mL OMNIPAQUE IOHEXOL 350 MG/ML SOLN COMPARISON:  Brain MRI 06/11/2004, MRA head 06/11/2004 FINDINGS: CTA NECK FINDINGS Aortic arch: The left vertebral artery arises directly from the aortic arch. Visualized portions of the aortic arch demonstrate no evidence of dissection or aneurysm. Scattered soft and calcified plaque within the visualized aortic arch and proximal major branch vessels of the neck. Right carotid system:  CCA and ICA patent within the neck without measurable stenosis. Mild soft and calcified plaque at the carotid bifurcation and within the proximal ICA. Left carotid system: CCA and ICA patent within the neck without measurable stenosis. Mild mixed plaque at the carotid bifurcation. Vertebral arteries: The right vertebral artery is significantly dominant mixed plaque at the right vertebral artery origin with resultant mild ostial stenosis. Distal to this, the right vertebral artery is patent within the neck without significant stenosis (50% or greater). The non dominant left vertebral artery is markedly diminutive with atherosclerotic irregularity, but appears patent within the neck. Skeleton: Cervical spondylosis without high-grade bony spinal canal narrowing Other neck: 2.2 cm left thyroid lobe nodule.  Otherwise, no soft tissue neck mass or pathologically enlarged cervical chain lymph nodes Upper chest: No consolidation within the imaged lung apices. Review of the MIP images confirms the above findings CTA HEAD FINDINGS Anterior circulation: Calcified plaque within the intracranial right internal carotid artery. Moderate stenosis of the paraclinoid segment with otherwise no more than mild stenosis. Calcified plaque within the intracranial left internal carotid artery. Moderate/severe stenosis within the paraclinoid left ICA. The right middle cerebral artery is patent with atherosclerotic irregularity. Moderate focal stenosis within the mid to distal M1 right MCA. Atherosclerotic irregularity of the M2 and more distal right MCA branches without vessel occlusion or high-grade proximal stenosis identified. The left middle cerebral artery is patent with atherosclerotic irregularity. Moderate focal stenosis within the distal M1 left middle cerebral artery. Atherosclerotic irregularity of the M2 and more distal left MCA branches without vessel occlusion or high-grade proximal stenosis identified. The bilateral anterior cerebral arteries are patent without significant proximal stenosis. No intracranial aneurysm is identified. Posterior circulation: The dominant intracranial right vertebral artery is patent with mild stenosis just proximal to the vertebrobasilar junction. The non dominant and diminutive intracranial left vertebral artery is patent with atherosclerotic irregularity and sites of up to moderate stenosis. The basilar artery is patent with mild atherosclerotic irregularity. Predominantly fetal origin of the right posterior cerebral artery which is patent. Moderate focal stenosis within the distal P2 right posterior cerebral artery. Predominantly fetal origin of the left posterior cerebral artery. There is significant atherosclerotic irregularity of the left posterior communicating artery. Moderate stenosis  within the mid P2 left posterior cerebral artery. Venous sinuses: Within limitations of contrast timing, no convincing thrombus. Anatomic variants: As described Review of the MIP images confirms the above findings These results were called by telephone at the time of interpretation on 03/18/2019 at 1:32 pm to provider [facility] , who verbally acknowledged these results. IMPRESSION: CTA neck: 1. Common and internal carotid arteries patent within the neck without measurable stenosis. 2. Significant dominant right vertebral artery without flow-limiting stenosis. Mixed plaque results in mild ostial stenosis. 3. A non dominant and developmentally diminutive left vertebral artery demonstrates significant atherosclerotic irregularity but appears patent throughout the neck. 4. 2.2 cm left thyroid lobe nodule. Dedicated thyroid ultrasound recommended for further evaluation. CTA head: 1. Significant intracranial atherosclerotic disease with multifocal stenoses, most notably as follows. 2. Moderate/severe stenosis of the paraclinoid left ICA. 3. Moderate stenosis of the paraclinoid right ICA. 4. Moderate focal stenoses within the bilateral M1 middle cerebral arteries. 5. Predominantly fetal origin of the left PCA with significant atherosclerotic irregularity of the left posterior communicating artery. 6. Additional mild and moderate stenoses as detailed. Electronically Signed   By: Kellie Simmering DO   On: 03/18/2019 14:07   Ct Head Code Stroke Wo Contrast  Result Date:  03/18/2019 CLINICAL DATA:  Code stroke. Focal neuro deficit, less than 6 hours, stroke suspected. Last known normal 11 a.m., left-sided weakness EXAM: CT HEAD WITHOUT CONTRAST TECHNIQUE: Contiguous axial images were obtained from the base of the skull through the vertex without intravenous contrast. COMPARISON:  Brain MRI 06/11/2004 FINDINGS: Brain: There is no acute intracranial hemorrhage. No demarcated cortical infarction is identified. Severe patchy  hypodensity within the bilateral cerebral white matter which is nonspecific, but consistent with advanced chronic small vessel ischemic disease. Chronic lacunar infarct within the left periatrial white matter. There are numerous lacunar infarcts within the bilateral basal ganglia and thalami, some of which appear chronic and some of which are age-indeterminate. No evidence of intracranial mass. No midline shift or extra-axial fluid collection. Mild generalized parenchymal atrophy. Vascular: No definite hyperdense vessel. Atherosclerotic calcifications. Skull: No calvarial fracture or suspicious osseous lesion Sinuses/Orbits: Visualized orbits demonstrate no acute abnormality. No significant paranasal sinus disease or mastoid effusion at the imaged levels These results were called by telephone at the time of interpretation on 03/18/2019 at 1:29 pm to provider Dr. Rory Percy, who verbally acknowledged these results. IMPRESSION: 1. No acute intracranial hemorrhage or demarcated cortical infarction 2. Advanced chronic small vessel ischemic disease. Numerous lacunar infarcts within the bilateral basal ganglia and thalami, some of which appear chronic and others which are age indeterminate. Consider brain MRI for further evaluation. Electronically Signed   By: Kellie Simmering DO   On: 03/18/2019 13:30    EKG: Independently reviewed.  Sinus rhythm with no acute ST-T wave changes  Assessment/Plan Active Problems:   Hyperlipidemia   Essential hypertension   CKD (chronic kidney disease) stage 3, GFR 30-59 ml/min   Paroxysmal atrial fibrillation (HCC)   Pacemaker - [employer], implanted 04/14/13   Acute ischemic stroke (HCC)   Slurred speech/right arm weakness/concern for stroke/hyperlipidemia/history of essential hypertension: -admit forTelemetry monitoring -Stroke protocol -Allow for permissive hypertension for the first 24-48h - only treat PRN if SBP 123456 mmHg or diastolic blood pressure [id number]. Blood pressures can be  gradually normalized to SBP<140 upon discharge.  Will hold antihypertensives at this point in time. -He has dual-chamber pacemaker.  According to wife and patient, they do not know if this is MRI compatible.  They do not have any card with them either.  He is Dr. Victorino December patient. I have called Trish from cardiology who is going to find out and let me know if this is MRI compatible and if this is then we will proceed with MRI brain without contrast per neurology recommendation -Maintain Euthermia.  -Echocardiogram to- rule out PFO -Lipid Panel, TSH and A1C (last hemoglobin A1c in June 2020 was 6.6) -Frequent neuro checks -Atorvastatin PO within 24 hrs.  -Risk factor modification -Neurology on board.  Neuro to decide if patient qualifies for antiplatelet agents while he is already on Eliquis. -PT/OT eval, Speech consult  Paroxysmal atrial fibrillation status post dual-chamber pacemaker: Currently in sinus rhythm.  Resume Eliquis.  CKD stage III: At baseline.  Continue to watch.  Type 2 diabetes mellitus: Last hemoglobin A1c in June 2020 was 6.6.  He is not on any medications.  Will place him on SSI.  DVT prophylaxis: Eliquis Code Status: Full code Family Communication: Wife present at bedside.  Plan of care discussed with both patient and his wife.   Disposition Plan: Most likely home tomorrow Consults called: Neurology Admission status: Observation   Darliss Cheney MD [facility]  03/18/2019, 2:58 PM  To contact the attending provider between  7A-7P or the covering provider during after hours 7P-7A, please log into the web site [email or web address] and use password [email or web address].   Update/addendum: I received a message from [place] from cardiology that patient's pacemaker is MRI compatible.  I ordered MRI.  I received a call from MRI tech that [employer] representative has to come and place the pacemaker in MRI mode before they can do MRI and for that reason, it will be done tomorrow.

## 2019-03-19 ENCOUNTER — Encounter (HOSPITAL_COMMUNITY): Payer: Self-pay | Admitting: General Practice

## 2019-03-19 ENCOUNTER — Observation Stay (HOSPITAL_COMMUNITY): Payer: Medicare HMO

## 2019-03-19 DIAGNOSIS — Z950 Presence of cardiac pacemaker: Secondary | ICD-10-CM

## 2019-03-19 DIAGNOSIS — E7800 Pure hypercholesterolemia, unspecified: Secondary | ICD-10-CM | POA: Diagnosis not present

## 2019-03-19 DIAGNOSIS — I6522 Occlusion and stenosis of left carotid artery: Secondary | ICD-10-CM | POA: Diagnosis not present

## 2019-03-19 DIAGNOSIS — I480 Paroxysmal atrial fibrillation: Secondary | ICD-10-CM

## 2019-03-19 DIAGNOSIS — I639 Cerebral infarction, unspecified: Secondary | ICD-10-CM | POA: Diagnosis not present

## 2019-03-19 DIAGNOSIS — I10 Essential (primary) hypertension: Secondary | ICD-10-CM | POA: Diagnosis not present

## 2019-03-19 DIAGNOSIS — N1832 Chronic kidney disease, stage 3b: Secondary | ICD-10-CM

## 2019-03-19 DIAGNOSIS — I495 Sick sinus syndrome: Secondary | ICD-10-CM | POA: Diagnosis not present

## 2019-03-19 DIAGNOSIS — G459 Transient cerebral ischemic attack, unspecified: Secondary | ICD-10-CM | POA: Diagnosis not present

## 2019-03-19 LAB — CBG MONITORING, ED
Glucose-Capillary: 82 mg/dL (ref 70–99)
Glucose-Capillary: 115 mg/dL — ABNORMAL HIGH (ref 70–99)

## 2019-03-19 LAB — LIPID PANEL
Cholesterol: 168 mg/dL (ref 0–200)
HDL: 47 mg/dL (ref >40)
LDL Cholesterol: 100 mg/dL — ABNORMAL HIGH (ref 0–99)
Total CHOL/HDL Ratio: 3.6 RATIO
Triglycerides: 103 mg/dL (ref <150)
VLDL: 21 mg/dL (ref 0–40)

## 2019-03-19 LAB — GLUCOSE, CAPILLARY
Glucose-Capillary: 92 mg/dL (ref 70–99)
Glucose-Capillary: 108 mg/dL — ABNORMAL HIGH (ref 70–99)

## 2019-03-19 LAB — HEMOGLOBIN A1C
Hgb A1c MFr Bld: 6.1 % — ABNORMAL HIGH (ref 4.8–5.6)
Mean Plasma Glucose: 128.37 mg/dL

## 2019-03-19 MED ORDER — ATORVASTATIN CALCIUM 40 MG PO TABS: 40.0000 mg | ORAL_TABLET | Freq: Every day | ORAL | Status: DC

## 2019-03-19 MED ORDER — ASPIRIN EC 81 MG PO TBEC
81.0000 mg | DELAYED_RELEASE_TABLET | Freq: Every day | ORAL | Status: DC
  Administered 2019-03-19 – 2019-03-20 (×2): 81 mg via ORAL
  Filled 2019-03-19 (×2): qty 1

## 2019-03-19 MED ORDER — ROSUVASTATIN CALCIUM 20 MG PO TABS
20.0000 mg | ORAL_TABLET | Freq: Every day | ORAL | Status: DC
  Administered 2019-03-19: 20 mg via ORAL
  Filled 2019-03-19: qty 1

## 2019-03-19 NOTE — Progress Notes (Signed)
Informed of MRI for today.   Device system confirmed to be MRI conditional, with implant date > 6 weeks ago and no evidence of abandoned or epicardial leads in review of most recent CXR Interrogation from today reviewed, pt is currently AP-VS at 67 bpm Change device settings for MRI to DOO at 85 bpm  Program device back to pre-MRI settings after completion of exam.  Shirley Friar, PA-C  03/19/2019 9:25 AM

## 2019-03-19 NOTE — Progress Notes (Signed)
Progress Note    [name]  [id number] DOB: [birth year]  DOA: 03/18/2019 PCP: [name], MD    Brief Narrative:     Medical records reviewed and are as summarized below:  [name] is an 85 y.o. male with medical history significant of prostate cancer, obstructive sleep apnea, hyperlipidemia, hypertension, paroxysmal atrial fibrillation on Eliquis status post dual-chamber pacemaker who was brought into the ED with the concern of slurred speech and right arm weakness which started around 11 AM.  Due to concern of a stroke, his wife called EMS.  When EMS arrived at around 12:10 PM, his symptoms had already resolved.  Patient did not have any other complaint.   Assessment/Plan:   Active Problems:   Hyperlipidemia   Essential hypertension   CKD (chronic kidney disease) stage 3, GFR 30-59 ml/min   Paroxysmal atrial fibrillation (HCC)   Pacemaker - [employer], implanted 04/14/13   Acute ischemic stroke (HCC)   TIA: Slurred speech/right arm weakness -tele -MRI pending -Allow for permissive hypertension for the first 24-48h - only treat PRN if SBP 123456 mmHg or diastolic blood pressure [id number]. Blood pressures can be gradually normalized to SBP<140 upon discharge.  Will hold antihypertensives at this point in time. -He has dual-chamber pacemaker.   -Echocardiogram: Left ventricular ejection fraction, by visual estimation, is 60 to 65%. The left ventricle has normal function. There is moderately increased left ventricular hypertrophy of the basal septum. CTA done -LDL:100- on statin HgbA1c: 6.1 -neurology consult appreciated -on ASA 81 and eliquis currently  Paroxysmal atrial fibrillation status post dual-chamber pacemaker:  -Resume Eliquis  CKD stage IIIb -trend -avoid nephrotoxins  Type 2 diabetes mellitus:  -HgbA1c: 6.1    Family Communication/Anticipated D/C date and plan/Code Status   DVT prophylaxis: eliquis Code Status: Full Code.    Family Communication:  Disposition Plan: wife at bedside   Medical Consultants:    Neurology    Subjective:   Hungry (no diet but passed swallow eval)  Objective:    Vitals:   03/19/19 1104 03/19/19 1230 03/19/19 1300 03/19/19 1315  BP: (!) 176/84 (!) 141/87 (!) 143/93   Pulse: 60 (!) 57  65  Resp: 18 (!) 22 14 18   Temp:      TempSrc:      SpO2: 95% 99%  100%   No intake or output data in the 24 hours ending 03/19/19 1510 There were no vitals filed for this visit.  Exam: In bed, NAD rrr No LE edema A+Ox3  Data Reviewed:   I have personally reviewed following labs and imaging studies:  Labs: Labs show the following:   Basic Metabolic Panel: Recent Labs  Lab 03/18/19 1308 03/18/19 1315  NA 144 146*  K 4.1 4.0  CL 112* 112*  CO2 20*  --   GLUCOSE 115* 110*  BUN 20 22  CREATININE 2.00* 2.00*  CALCIUM 10.3  --    GFR CrCl cannot be calculated (Unknown ideal weight.). Liver Function Tests: Recent Labs  Lab 03/18/19 1308  AST 18  ALT 12  ALKPHOS 128*  BILITOT 0.4  PROT 6.5  ALBUMIN 3.4*   No results for input(s): LIPASE, AMYLASE in the last 168 hours. No results for input(s): AMMONIA in the last 168 hours. Coagulation profile Recent Labs  Lab 03/18/19 1308  INR 1.1    CBC: Recent Labs  Lab 03/18/19 1308 03/18/19 1315  WBC 9.9  --   NEUTROABS 5.9  --   HGB 13.6  13.9  HCT 43.4 41.0  MCV 96.0  --   PLT 184  --    Cardiac Enzymes: No results for input(s): CKTOTAL, CKMB, CKMBINDEX, TROPONINI in the last 168 hours. BNP (last 3 results) No results for input(s): PROBNP in the last 8760 hours. CBG: Recent Labs  Lab 03/18/19 1307 03/18/19 1748 03/18/19 2000 03/19/19 0807 03/19/19 1255  GLUCAP 101* 59* 113* 82 115*   D-Dimer: No results for input(s): DDIMER in the last 72 hours. Hgb A1c: Recent Labs    03/18/19 1308 03/19/19 0715  HGBA1C 6.2* 6.1*   Lipid Profile: Recent Labs    03/19/19 0715  CHOL 168  HDL 47   LDLCALC 100*  TRIG 103  CHOLHDL 3.6   Thyroid function studies: No results for input(s): TSH, T4TOTAL, T3FREE, THYROIDAB in the last 72 hours.  Invalid input(s): FREET3 Anemia work up: No results for input(s): VITAMINB12, FOLATE, FERRITIN, TIBC, IRON, RETICCTPCT in the last 72 hours. Sepsis Labs: Recent Labs  Lab 03/18/19 1308  WBC 9.9    Microbiology Recent Results (from the past 240 hour(s))  SARS CORONAVIRUS 2 (TAT 6-24 HRS) Nasopharyngeal Nasopharyngeal Swab     Status: None   Collection Time: 03/18/19  2:05 PM   Specimen: Nasopharyngeal Swab  Result Value Ref Range Status   SARS Coronavirus 2 NEGATIVE NEGATIVE Final    Comment: (NOTE) SARS-CoV-2 target nucleic acids are NOT DETECTED. The SARS-CoV-2 RNA is generally detectable in upper and lower respiratory specimens during the acute phase of infection. Negative results do not preclude SARS-CoV-2 infection, do not rule out co-infections with other pathogens, and should not be used as the sole basis for treatment or other patient management decisions. Negative results must be combined with clinical observations, patient history, and epidemiological information. The expected result is Negative. Fact Sheet for Patients: SugarRoll.be Fact Sheet for Healthcare Providers: https://www.woods-mathews.com/ This test is not yet approved or cleared by the [place] FDA and  has been authorized for detection and/or diagnosis of SARS-CoV-2 by FDA under an Emergency Use Authorization (EUA). This EUA will remain  in effect (meaning this test can be used) for the duration of the COVID-19 declaration under Section 56 4(b)(1) of the Act, 21 U.S.C. section 360bbb-3(b)(1), unless the authorization is terminated or revoked sooner. Performed at [facility], [place] [address] [place], [place] [address]     Procedures and diagnostic studies:  Ct Angio Head W Or Wo Contrast  Result  Date: 03/18/2019 CLINICAL DATA:  Stroke.  Right-sided weakness now resolved EXAM: CT ANGIOGRAPHY HEAD AND NECK TECHNIQUE: Multidetector CT imaging of the head and neck was performed using the standard protocol during bolus administration of intravenous contrast. Multiplanar CT image reconstructions and MIPs were obtained to evaluate the vascular anatomy. Carotid stenosis measurements (when applicable) are obtained utilizing NASCET criteria, using the distal internal carotid diameter as the denominator. CONTRAST:  35mL OMNIPAQUE IOHEXOL 350 MG/ML SOLN COMPARISON:  Brain MRI 06/11/2004, MRA head 06/11/2004 FINDINGS: CTA NECK FINDINGS Aortic arch: The left vertebral artery arises directly from the aortic arch. Visualized portions of the aortic arch demonstrate no evidence of dissection or aneurysm. Scattered soft and calcified plaque within the visualized aortic arch and proximal major branch vessels of the neck. Right carotid system: CCA and ICA patent within the neck without measurable stenosis. Mild soft and calcified plaque at the carotid bifurcation and within the proximal ICA. Left carotid system: CCA and ICA patent within the neck without measurable stenosis. Mild mixed plaque at  the carotid bifurcation. Vertebral arteries: The right vertebral artery is significantly dominant mixed plaque at the right vertebral artery origin with resultant mild ostial stenosis. Distal to this, the right vertebral artery is patent within the neck without significant stenosis (50% or greater). The non dominant left vertebral artery is markedly diminutive with atherosclerotic irregularity, but appears patent within the neck. Skeleton: Cervical spondylosis without high-grade bony spinal canal narrowing Other neck: 2.2 cm left thyroid lobe nodule. Otherwise, no soft tissue neck mass or pathologically enlarged cervical chain lymph nodes Upper chest: No consolidation within the imaged lung apices. Review of the MIP images confirms the  above findings CTA HEAD FINDINGS Anterior circulation: Calcified plaque within the intracranial right internal carotid artery. Moderate stenosis of the paraclinoid segment with otherwise no more than mild stenosis. Calcified plaque within the intracranial left internal carotid artery. Moderate/severe stenosis within the paraclinoid left ICA. The right middle cerebral artery is patent with atherosclerotic irregularity. Moderate focal stenosis within the mid to distal M1 right MCA. Atherosclerotic irregularity of the M2 and more distal right MCA branches without vessel occlusion or high-grade proximal stenosis identified. The left middle cerebral artery is patent with atherosclerotic irregularity. Moderate focal stenosis within the distal M1 left middle cerebral artery. Atherosclerotic irregularity of the M2 and more distal left MCA branches without vessel occlusion or high-grade proximal stenosis identified. The bilateral anterior cerebral arteries are patent without significant proximal stenosis. No intracranial aneurysm is identified. Posterior circulation: The dominant intracranial right vertebral artery is patent with mild stenosis just proximal to the vertebrobasilar junction. The non dominant and diminutive intracranial left vertebral artery is patent with atherosclerotic irregularity and sites of up to moderate stenosis. The basilar artery is patent with mild atherosclerotic irregularity. Predominantly fetal origin of the right posterior cerebral artery which is patent. Moderate focal stenosis within the distal P2 right posterior cerebral artery. Predominantly fetal origin of the left posterior cerebral artery. There is significant atherosclerotic irregularity of the left posterior communicating artery. Moderate stenosis within the mid P2 left posterior cerebral artery. Venous sinuses: Within limitations of contrast timing, no convincing thrombus. Anatomic variants: As described Review of the MIP images  confirms the above findings These results were called by telephone at the time of interpretation on 03/18/2019 at 1:32 pm to provider [facility] , who verbally acknowledged these results. IMPRESSION: CTA neck: 1. Common and internal carotid arteries patent within the neck without measurable stenosis. 2. Significant dominant right vertebral artery without flow-limiting stenosis. Mixed plaque results in mild ostial stenosis. 3. A non dominant and developmentally diminutive left vertebral artery demonstrates significant atherosclerotic irregularity but appears patent throughout the neck. 4. 2.2 cm left thyroid lobe nodule. Dedicated thyroid ultrasound recommended for further evaluation. CTA head: 1. Significant intracranial atherosclerotic disease with multifocal stenoses, most notably as follows. 2. Moderate/severe stenosis of the paraclinoid left ICA. 3. Moderate stenosis of the paraclinoid right ICA. 4. Moderate focal stenoses within the bilateral M1 middle cerebral arteries. 5. Predominantly fetal origin of the left PCA with significant atherosclerotic irregularity of the left posterior communicating artery. 6. Additional mild and moderate stenoses as detailed. Electronically Signed   By: Kellie Simmering DO   On: 03/18/2019 14:07   Ct Angio Neck W Or Wo Contrast  Result Date: 03/18/2019 CLINICAL DATA:  Stroke.  Right-sided weakness now resolved EXAM: CT ANGIOGRAPHY HEAD AND NECK TECHNIQUE: Multidetector CT imaging of the head and neck was performed using the standard protocol during bolus administration of intravenous contrast. Multiplanar CT image  reconstructions and MIPs were obtained to evaluate the vascular anatomy. Carotid stenosis measurements (when applicable) are obtained utilizing NASCET criteria, using the distal internal carotid diameter as the denominator. CONTRAST:  84mL OMNIPAQUE IOHEXOL 350 MG/ML SOLN COMPARISON:  Brain MRI 06/11/2004, MRA head 06/11/2004 FINDINGS: CTA NECK FINDINGS Aortic arch:  The left vertebral artery arises directly from the aortic arch. Visualized portions of the aortic arch demonstrate no evidence of dissection or aneurysm. Scattered soft and calcified plaque within the visualized aortic arch and proximal major branch vessels of the neck. Right carotid system: CCA and ICA patent within the neck without measurable stenosis. Mild soft and calcified plaque at the carotid bifurcation and within the proximal ICA. Left carotid system: CCA and ICA patent within the neck without measurable stenosis. Mild mixed plaque at the carotid bifurcation. Vertebral arteries: The right vertebral artery is significantly dominant mixed plaque at the right vertebral artery origin with resultant mild ostial stenosis. Distal to this, the right vertebral artery is patent within the neck without significant stenosis (50% or greater). The non dominant left vertebral artery is markedly diminutive with atherosclerotic irregularity, but appears patent within the neck. Skeleton: Cervical spondylosis without high-grade bony spinal canal narrowing Other neck: 2.2 cm left thyroid lobe nodule. Otherwise, no soft tissue neck mass or pathologically enlarged cervical chain lymph nodes Upper chest: No consolidation within the imaged lung apices. Review of the MIP images confirms the above findings CTA HEAD FINDINGS Anterior circulation: Calcified plaque within the intracranial right internal carotid artery. Moderate stenosis of the paraclinoid segment with otherwise no more than mild stenosis. Calcified plaque within the intracranial left internal carotid artery. Moderate/severe stenosis within the paraclinoid left ICA. The right middle cerebral artery is patent with atherosclerotic irregularity. Moderate focal stenosis within the mid to distal M1 right MCA. Atherosclerotic irregularity of the M2 and more distal right MCA branches without vessel occlusion or high-grade proximal stenosis identified. The left middle cerebral  artery is patent with atherosclerotic irregularity. Moderate focal stenosis within the distal M1 left middle cerebral artery. Atherosclerotic irregularity of the M2 and more distal left MCA branches without vessel occlusion or high-grade proximal stenosis identified. The bilateral anterior cerebral arteries are patent without significant proximal stenosis. No intracranial aneurysm is identified. Posterior circulation: The dominant intracranial right vertebral artery is patent with mild stenosis just proximal to the vertebrobasilar junction. The non dominant and diminutive intracranial left vertebral artery is patent with atherosclerotic irregularity and sites of up to moderate stenosis. The basilar artery is patent with mild atherosclerotic irregularity. Predominantly fetal origin of the right posterior cerebral artery which is patent. Moderate focal stenosis within the distal P2 right posterior cerebral artery. Predominantly fetal origin of the left posterior cerebral artery. There is significant atherosclerotic irregularity of the left posterior communicating artery. Moderate stenosis within the mid P2 left posterior cerebral artery. Venous sinuses: Within limitations of contrast timing, no convincing thrombus. Anatomic variants: As described Review of the MIP images confirms the above findings These results were called by telephone at the time of interpretation on 03/18/2019 at 1:32 pm to provider [facility] , who verbally acknowledged these results. IMPRESSION: CTA neck: 1. Common and internal carotid arteries patent within the neck without measurable stenosis. 2. Significant dominant right vertebral artery without flow-limiting stenosis. Mixed plaque results in mild ostial stenosis. 3. A non dominant and developmentally diminutive left vertebral artery demonstrates significant atherosclerotic irregularity but appears patent throughout the neck. 4. 2.2 cm left thyroid lobe nodule. Dedicated thyroid ultrasound  recommended for further evaluation. CTA head: 1. Significant intracranial atherosclerotic disease with multifocal stenoses, most notably as follows. 2. Moderate/severe stenosis of the paraclinoid left ICA. 3. Moderate stenosis of the paraclinoid right ICA. 4. Moderate focal stenoses within the bilateral M1 middle cerebral arteries. 5. Predominantly fetal origin of the left PCA with significant atherosclerotic irregularity of the left posterior communicating artery. 6. Additional mild and moderate stenoses as detailed. Electronically Signed   By: Kellie Simmering DO   On: 03/18/2019 14:07   Ct Head Code Stroke Wo Contrast  Result Date: 03/18/2019 CLINICAL DATA:  Code stroke. Focal neuro deficit, less than 6 hours, stroke suspected. Last known normal 11 a.m., left-sided weakness EXAM: CT HEAD WITHOUT CONTRAST TECHNIQUE: Contiguous axial images were obtained from the base of the skull through the vertex without intravenous contrast. COMPARISON:  Brain MRI 06/11/2004 FINDINGS: Brain: There is no acute intracranial hemorrhage. No demarcated cortical infarction is identified. Severe patchy hypodensity within the bilateral cerebral white matter which is nonspecific, but consistent with advanced chronic small vessel ischemic disease. Chronic lacunar infarct within the left periatrial white matter. There are numerous lacunar infarcts within the bilateral basal ganglia and thalami, some of which appear chronic and some of which are age-indeterminate. No evidence of intracranial mass. No midline shift or extra-axial fluid collection. Mild generalized parenchymal atrophy. Vascular: No definite hyperdense vessel. Atherosclerotic calcifications. Skull: No calvarial fracture or suspicious osseous lesion Sinuses/Orbits: Visualized orbits demonstrate no acute abnormality. No significant paranasal sinus disease or mastoid effusion at the imaged levels These results were called by telephone at the time of interpretation on 03/18/2019  at 1:29 pm to provider Dr. Rory Percy, who verbally acknowledged these results. IMPRESSION: 1. No acute intracranial hemorrhage or demarcated cortical infarction 2. Advanced chronic small vessel ischemic disease. Numerous lacunar infarcts within the bilateral basal ganglia and thalami, some of which appear chronic and others which are age indeterminate. Consider brain MRI for further evaluation. Electronically Signed   By: Kellie Simmering DO   On: 03/18/2019 13:30    Medications:     stroke: mapping our early stages of recovery book   Does not apply Once   apixaban  2.5 mg Oral BID   aspirin EC  81 mg Oral Daily   insulin aspart  0-5 Units Subcutaneous QHS   insulin aspart  0-9 Units Subcutaneous TID WC   rosuvastatin  20 mg Oral q1800   sodium chloride flush  3 mL Intravenous Once   Continuous Infusions:   LOS: 0 days   Geradine Girt  [facility]   How to contact the [facility] Attending or Consulting provider [place] or covering provider during after hours [place], for this patient?  1. Check the care team in [facility] and look for a) attending/consulting [facility] provider listed and b) the [facility] team listed 2. Log into [email or web address] and use [place]'s universal password to access. If you do not have the password, please contact the hospital operator. 3. Locate the [facility] provider you are looking for under [facility] and page to a number that you can be directly reached. 4. If you still have difficulty reaching the provider, please page the [facility] (Director on Call) for the Hospitalists listed on amion for assistance.  03/19/2019, 3:10 PM

## 2019-03-19 NOTE — ED Notes (Signed)
Pt ambulatory to and from restroom with steady gait 

## 2019-03-19 NOTE — ED Notes (Signed)
Lunch at bedside 

## 2019-03-19 NOTE — Progress Notes (Signed)
STROKE TEAM PROGRESS NOTE   INTERVAL HISTORY Wife at bedside. Wife stated that pt was in recliner, and wife came back to talk to him but he was not responding verbally.  EMS called, on exam showed right facial droop and right arm and leg weakness.  Checked BP 130s and glucose 160 as documented. However, wife said he received glucose IV by EMS and his symptoms improved and then resolved on ER arrival. Pt currently stated that he is back to baseline.   Vitals:   03/19/19 0326 03/19/19 0408 03/19/19 0642 03/19/19 1104  BP: (!) 154/98 (!) 171/91 (!) 174/98 (!) 176/84  Pulse: 61 62 62 60  Resp: 18 14 17 18   Temp:   97.8 F (36.6 C)   TempSrc:   Oral   SpO2: 98% 98% 100% 95%    CBC:  Recent Labs  Lab 03/18/19 1308 03/18/19 1315  WBC 9.9  --   NEUTROABS 5.9  --   HGB 13.6 13.9  HCT 43.4 41.0  MCV 96.0  --   PLT 184  --     Basic Metabolic Panel:  Recent Labs  Lab 03/18/19 1308 03/18/19 1315  NA 144 146*  K 4.1 4.0  CL 112* 112*  CO2 20*  --   GLUCOSE 115* 110*  BUN 20 22  CREATININE 2.00* 2.00*  CALCIUM 10.3  --    Lipid Panel:     Component Value Date/Time   CHOL 168 03/19/2019 0715   TRIG 103 03/19/2019 0715   TRIG 77 04/11/2006 0906   HDL 47 03/19/2019 0715   CHOLHDL 3.6 03/19/2019 0715   VLDL 21 03/19/2019 0715   LDLCALC 100 (H) 03/19/2019 0715   HgbA1c:  Lab Results  Component Value Date   HGBA1C 6.1 (H) 03/19/2019   Urine Drug Screen: No results found for: LABOPIA, COCAINSCRNUR, LABBENZ, AMPHETMU, THCU, LABBARB  Alcohol Level No results found for: ETH  IMAGING Ct Angio Head W Or Wo Contrast  Result Date: 03/18/2019 CLINICAL DATA:  Stroke.  Right-sided weakness now resolved EXAM: CT ANGIOGRAPHY HEAD AND NECK TECHNIQUE: Multidetector CT imaging of the head and neck was performed using the standard protocol during bolus administration of intravenous contrast. Multiplanar CT image reconstructions and MIPs were obtained to evaluate the vascular anatomy.  Carotid stenosis measurements (when applicable) are obtained utilizing NASCET criteria, using the distal internal carotid diameter as the denominator. CONTRAST:  44mL OMNIPAQUE IOHEXOL 350 MG/ML SOLN COMPARISON:  Brain MRI 06/11/2004, MRA head 06/11/2004 FINDINGS: CTA NECK FINDINGS Aortic arch: The left vertebral artery arises directly from the aortic arch. Visualized portions of the aortic arch demonstrate no evidence of dissection or aneurysm. Scattered soft and calcified plaque within the visualized aortic arch and proximal major branch vessels of the neck. Right carotid system: CCA and ICA patent within the neck without measurable stenosis. Mild soft and calcified plaque at the carotid bifurcation and within the proximal ICA. Left carotid system: CCA and ICA patent within the neck without measurable stenosis. Mild mixed plaque at the carotid bifurcation. Vertebral arteries: The right vertebral artery is significantly dominant mixed plaque at the right vertebral artery origin with resultant mild ostial stenosis. Distal to this, the right vertebral artery is patent within the neck without significant stenosis (50% or greater). The non dominant left vertebral artery is markedly diminutive with atherosclerotic irregularity, but appears patent within the neck. Skeleton: Cervical spondylosis without high-grade bony spinal canal narrowing Other neck: 2.2 cm left thyroid lobe nodule. Otherwise, no soft tissue  neck mass or pathologically enlarged cervical chain lymph nodes Upper chest: No consolidation within the imaged lung apices. Review of the MIP images confirms the above findings CTA HEAD FINDINGS Anterior circulation: Calcified plaque within the intracranial right internal carotid artery. Moderate stenosis of the paraclinoid segment with otherwise no more than mild stenosis. Calcified plaque within the intracranial left internal carotid artery. Moderate/severe stenosis within the paraclinoid left ICA. The right  middle cerebral artery is patent with atherosclerotic irregularity. Moderate focal stenosis within the mid to distal M1 right MCA. Atherosclerotic irregularity of the M2 and more distal right MCA branches without vessel occlusion or high-grade proximal stenosis identified. The left middle cerebral artery is patent with atherosclerotic irregularity. Moderate focal stenosis within the distal M1 left middle cerebral artery. Atherosclerotic irregularity of the M2 and more distal left MCA branches without vessel occlusion or high-grade proximal stenosis identified. The bilateral anterior cerebral arteries are patent without significant proximal stenosis. No intracranial aneurysm is identified. Posterior circulation: The dominant intracranial right vertebral artery is patent with mild stenosis just proximal to the vertebrobasilar junction. The non dominant and diminutive intracranial left vertebral artery is patent with atherosclerotic irregularity and sites of up to moderate stenosis. The basilar artery is patent with mild atherosclerotic irregularity. Predominantly fetal origin of the right posterior cerebral artery which is patent. Moderate focal stenosis within the distal P2 right posterior cerebral artery. Predominantly fetal origin of the left posterior cerebral artery. There is significant atherosclerotic irregularity of the left posterior communicating artery. Moderate stenosis within the mid P2 left posterior cerebral artery. Venous sinuses: Within limitations of contrast timing, no convincing thrombus. Anatomic variants: As described Review of the MIP images confirms the above findings These results were called by telephone at the time of interpretation on 03/18/2019 at 1:32 pm to provider [facility] , who verbally acknowledged these results. IMPRESSION: CTA neck: 1. Common and internal carotid arteries patent within the neck without measurable stenosis. 2. Significant dominant right vertebral artery without  flow-limiting stenosis. Mixed plaque results in mild ostial stenosis. 3. A non dominant and developmentally diminutive left vertebral artery demonstrates significant atherosclerotic irregularity but appears patent throughout the neck. 4. 2.2 cm left thyroid lobe nodule. Dedicated thyroid ultrasound recommended for further evaluation. CTA head: 1. Significant intracranial atherosclerotic disease with multifocal stenoses, most notably as follows. 2. Moderate/severe stenosis of the paraclinoid left ICA. 3. Moderate stenosis of the paraclinoid right ICA. 4. Moderate focal stenoses within the bilateral M1 middle cerebral arteries. 5. Predominantly fetal origin of the left PCA with significant atherosclerotic irregularity of the left posterior communicating artery. 6. Additional mild and moderate stenoses as detailed. Electronically Signed   By: Kellie Simmering DO   On: 03/18/2019 14:07   Ct Angio Neck W Or Wo Contrast  Result Date: 03/18/2019 CLINICAL DATA:  Stroke.  Right-sided weakness now resolved EXAM: CT ANGIOGRAPHY HEAD AND NECK TECHNIQUE: Multidetector CT imaging of the head and neck was performed using the standard protocol during bolus administration of intravenous contrast. Multiplanar CT image reconstructions and MIPs were obtained to evaluate the vascular anatomy. Carotid stenosis measurements (when applicable) are obtained utilizing NASCET criteria, using the distal internal carotid diameter as the denominator. CONTRAST:  90mL OMNIPAQUE IOHEXOL 350 MG/ML SOLN COMPARISON:  Brain MRI 06/11/2004, MRA head 06/11/2004 FINDINGS: CTA NECK FINDINGS Aortic arch: The left vertebral artery arises directly from the aortic arch. Visualized portions of the aortic arch demonstrate no evidence of dissection or aneurysm. Scattered soft and calcified plaque within the visualized  aortic arch and proximal major branch vessels of the neck. Right carotid system: CCA and ICA patent within the neck without measurable stenosis.  Mild soft and calcified plaque at the carotid bifurcation and within the proximal ICA. Left carotid system: CCA and ICA patent within the neck without measurable stenosis. Mild mixed plaque at the carotid bifurcation. Vertebral arteries: The right vertebral artery is significantly dominant mixed plaque at the right vertebral artery origin with resultant mild ostial stenosis. Distal to this, the right vertebral artery is patent within the neck without significant stenosis (50% or greater). The non dominant left vertebral artery is markedly diminutive with atherosclerotic irregularity, but appears patent within the neck. Skeleton: Cervical spondylosis without high-grade bony spinal canal narrowing Other neck: 2.2 cm left thyroid lobe nodule. Otherwise, no soft tissue neck mass or pathologically enlarged cervical chain lymph nodes Upper chest: No consolidation within the imaged lung apices. Review of the MIP images confirms the above findings CTA HEAD FINDINGS Anterior circulation: Calcified plaque within the intracranial right internal carotid artery. Moderate stenosis of the paraclinoid segment with otherwise no more than mild stenosis. Calcified plaque within the intracranial left internal carotid artery. Moderate/severe stenosis within the paraclinoid left ICA. The right middle cerebral artery is patent with atherosclerotic irregularity. Moderate focal stenosis within the mid to distal M1 right MCA. Atherosclerotic irregularity of the M2 and more distal right MCA branches without vessel occlusion or high-grade proximal stenosis identified. The left middle cerebral artery is patent with atherosclerotic irregularity. Moderate focal stenosis within the distal M1 left middle cerebral artery. Atherosclerotic irregularity of the M2 and more distal left MCA branches without vessel occlusion or high-grade proximal stenosis identified. The bilateral anterior cerebral arteries are patent without significant proximal  stenosis. No intracranial aneurysm is identified. Posterior circulation: The dominant intracranial right vertebral artery is patent with mild stenosis just proximal to the vertebrobasilar junction. The non dominant and diminutive intracranial left vertebral artery is patent with atherosclerotic irregularity and sites of up to moderate stenosis. The basilar artery is patent with mild atherosclerotic irregularity. Predominantly fetal origin of the right posterior cerebral artery which is patent. Moderate focal stenosis within the distal P2 right posterior cerebral artery. Predominantly fetal origin of the left posterior cerebral artery. There is significant atherosclerotic irregularity of the left posterior communicating artery. Moderate stenosis within the mid P2 left posterior cerebral artery. Venous sinuses: Within limitations of contrast timing, no convincing thrombus. Anatomic variants: As described Review of the MIP images confirms the above findings These results were called by telephone at the time of interpretation on 03/18/2019 at 1:32 pm to provider [facility] , who verbally acknowledged these results. IMPRESSION: CTA neck: 1. Common and internal carotid arteries patent within the neck without measurable stenosis. 2. Significant dominant right vertebral artery without flow-limiting stenosis. Mixed plaque results in mild ostial stenosis. 3. A non dominant and developmentally diminutive left vertebral artery demonstrates significant atherosclerotic irregularity but appears patent throughout the neck. 4. 2.2 cm left thyroid lobe nodule. Dedicated thyroid ultrasound recommended for further evaluation. CTA head: 1. Significant intracranial atherosclerotic disease with multifocal stenoses, most notably as follows. 2. Moderate/severe stenosis of the paraclinoid left ICA. 3. Moderate stenosis of the paraclinoid right ICA. 4. Moderate focal stenoses within the bilateral M1 middle cerebral arteries. 5. Predominantly  fetal origin of the left PCA with significant atherosclerotic irregularity of the left posterior communicating artery. 6. Additional mild and moderate stenoses as detailed. Electronically Signed   By: Kellie Simmering DO  On: 03/18/2019 14:07   Ct Head Code Stroke Wo Contrast  Result Date: 03/18/2019 CLINICAL DATA:  Code stroke. Focal neuro deficit, less than 6 hours, stroke suspected. Last known normal 11 a.m., left-sided weakness EXAM: CT HEAD WITHOUT CONTRAST TECHNIQUE: Contiguous axial images were obtained from the base of the skull through the vertex without intravenous contrast. COMPARISON:  Brain MRI 06/11/2004 FINDINGS: Brain: There is no acute intracranial hemorrhage. No demarcated cortical infarction is identified. Severe patchy hypodensity within the bilateral cerebral white matter which is nonspecific, but consistent with advanced chronic small vessel ischemic disease. Chronic lacunar infarct within the left periatrial white matter. There are numerous lacunar infarcts within the bilateral basal ganglia and thalami, some of which appear chronic and some of which are age-indeterminate. No evidence of intracranial mass. No midline shift or extra-axial fluid collection. Mild generalized parenchymal atrophy. Vascular: No definite hyperdense vessel. Atherosclerotic calcifications. Skull: No calvarial fracture or suspicious osseous lesion Sinuses/Orbits: Visualized orbits demonstrate no acute abnormality. No significant paranasal sinus disease or mastoid effusion at the imaged levels These results were called by telephone at the time of interpretation on 03/18/2019 at 1:29 pm to provider Dr. Rory Percy, who verbally acknowledged these results. IMPRESSION: 1. No acute intracranial hemorrhage or demarcated cortical infarction 2. Advanced chronic small vessel ischemic disease. Numerous lacunar infarcts within the bilateral basal ganglia and thalami, some of which appear chronic and others which are age indeterminate.  Consider brain MRI for further evaluation. Electronically Signed   By: Kellie Simmering DO   On: 03/18/2019 13:30    PHYSICAL EXAM  Temp:  [97.8 F (36.6 C)-97.9 F (36.6 C)] 97.9 F (36.6 C) (11/05 1654) Pulse Rate:  [57-71] 61 (11/05 1654) Resp:  [14-22] 17 (11/05 1654) BP: (141-177)/(60-106) 165/106 (11/05 1654) SpO2:  [95 %-100 %] 100 % (11/05 1654)  General - Well nourished, well developed, in no apparent distress.  Ophthalmologic - fundi not visualized due to noncooperation.  Cardiovascular - Regular rhythm and rate.  Mental Status -  Level of arousal and orientation to time, place, and person were intact. Language including expression, naming, repetition, comprehension was assessed and found intact. Fund of Knowledge was assessed and was intact.  Cranial Nerves II - XII - II - Visual field intact OU. III, IV, VI - Extraocular movements intact. V - Facial sensation intact bilaterally. VII - Facial movement intact bilaterally. VIII - Hearing & vestibular intact bilaterally. X - Palate elevates symmetrically, mild dysarthria, but pt and wife said that is his baseline. XI - Chin turning & shoulder shrug intact bilaterally. XII - Tongue protrusion intact.  Motor Strength - The patient's strength was normal in all extremities and pronator drift was absent.  Bulk was normal and fasciculations were absent.   Motor Tone - Muscle tone was assessed at the neck and appendages and was normal.  Reflexes - The patient's reflexes were symmetrical in all extremities and he had no pathological reflexes.  Sensory - Light touch, temperature/pinprick were assessed and were symmetrical.    Coordination - The patient had normal movements in the hands and feet with no ataxia or dysmetria.  Tremor was absent.  Gait and Station - deferred.   ASSESSMENT/PLAN [name] is a 85 y.o. male with history of atrial fibrillation on Eliquis last dose taken at 9 AM 11/4, tachybradycardia  syndrome status post pacemaker, hypertension, hyperlipidemia, diabetes, prostate cancer, presenting with sudden onset of inability to talk and right-sided weakness x 1 hr.   TIA:  left brain TIA, likely large vessel source due to b/l ICA siphon stenosis, left more than right.   Code Stroke CT head No acute abnormality. advanced Small vessel disease. Atrophy. Multiple old and ? Age B basal ganglia and thalamic lacunes.   CTA head significant atherosclerosis. Moderate/severe paraclinoid L ICA, moderate paraclinoid R ICA, focal stenoses B M1. Fetal origin L PCA w/ significant atherosclerosis PCom.   CTA neck significant R VA stenosis.   MRI no acute infarct. Left frontal faint DWI signal not convincing for stroke  2D Echo EF 60-65%. No source of embolus   As per Dr. Sallyanne Kuster, pacemaker interrogation showed every low AF burden  LDL 100  HgbA1c 6.1  eliquis for VTE prophylaxis  Eliquis (apixaban) daily prior to admission, now on Eliquis (apixaban) daily. Recommend to add ASA 81 on top of eliquis.   Therapy recommendations:  pending   Disposition:  pending   Intracranial ICA stenosis  CTA showed b/l ICA siphon athero with left > right  Likely the etiology of current TIA  Avoid low BP  Check orthostatic vitals  BP goal 130-150   Add ASA 81 on top of eliquis  Atrial Fibrillation  Home anticoagulation:  Eliquis (apixaban) daily continued in the hospital . Continue Eliquis (apixaban) daily at discharge . eliquis dose correct based on age and renal function   Hypertension  Stable . Permissive hypertension (OK if < 180/105) but gradually reach goal in 3-5 days . Long-term BP goal 130-150 given ICA terminal stenosis  Hyperlipidemia  Home meds:  zocor 18  Now on lipitor 40  LDL 100, goal < 70  Continue statin at discharge  Tobacco abuse  Current smoker  Smoking cessation counseling provided  Pt is willing to quit  Other Stroke Risk Factors  Advanced  age  Tachybradycardia s/p Pacer, [employer], placed 2014, followed by Dr. Sallyanne Kuster  Other Active Problems  CKD stage III at baseline Cre 2.00  Hx prostate cancer   2.2cm L thyroid nodule, [place] f/u recommended  Hospital day # 0  Neurology will sign off. Please call with questions. Pt will follow up with stroke clinic NP at [facility] in about 4 weeks. Thanks for the consult.   Rosalin Hawking, MD PhD Stroke Neurology 03/19/2019 5:51 PM    To contact Stroke Continuity provider, please refer to [email or web address] After hours, contact General Neurology

## 2019-03-19 NOTE — ED Notes (Signed)
Patient transported to MRI 

## 2019-03-19 NOTE — ED Notes (Signed)
Dinner tray has been ordered 

## 2019-03-19 NOTE — Consult Note (Addendum)
Cardiology Consultation:   Patient ID: DESMINE SATURNO; [id number]; April 19, 1933   Admit date: 03/18/2019 Date of Consult: 03/19/2019  Primary Care Provider: [name], MD Primary Cardiologist: Dr. Gwenlyn Found Primary Electrophysiologist:  None   Patient Profile:   [name] is a 85 y.o. male with a hx of paroxysmal atrial fibrillation (on eliquis)/sinus syndrome s/p permanent transvenous pacemaker by Dr. Renaee Munda in 04/2013, HTN, HLD, and prior TIA who is being seen today for the evaluation of atrial fibrillation with pacemaker at the request of Dr. Eliseo Squires.  History of Present Illness:   [name] and wife at bedside.  They report that yesterday morning, after he woke up and ate breakfast, he went to go and watch TV and then developed a episode of right arm weakness and inability to speak.  He was unable to move his right arm and the wife called EMS.  By the time EMS arrived his speech had improved, right arm weakness improved when he came into the hospital.  He denies any other symptoms recently, denies any fevers, chills, nausea, vomiting, chest pain, shortness of breath, lightheadedness, dizziness, headaches, or other symptoms.  He denies any recent sick contacts or recent travel.  He does have a history of atrial fibrillation, reports compliance with his Eliquis and other home medications.  He does endorse smoking about 1 pack/week, over the past 40 years.  Denies any alcohol or drug use.  CT head showed no acute findings, TPA was contraindicated due to taking his Eliquis.  Neurology was consulted in the ED felt that this likely a TIA, recommended obtaining MRI.  Past Medical History:  Diagnosis Date   Disturbances of sulphur-bearing amino-acid metabolism    Malignant neoplasm of prostate ([place])    Obstructive sleep apnea (adult) (pediatric) 2004   surgery to coorrect   Osteoarthrosis, unspecified whether generalized or localized, unspecified site    Other and  unspecified hyperlipidemia    Other specified cardiac dysrhythmias(427.89)    Pacemaker 04/14/2013   DUAL CHAMBER PACEMAKER   Paroxysmal atrial fibrillation ([place])    Unspecified cerebral artery occlusion with cerebral infarction    Unspecified disorder resulting from impaired renal function    Unspecified essential hypertension    Wears glasses    Wears partial dentures    upper partial    Past Surgical History:  Procedure Laterality Date   APPLICATION OF A-CELL OF EXTREMITY Right 08/12/2013   Procedure: APPLICATION OF A-CELL OF EXTREMITY;  Surgeon: Theodoro Kos, DO;  Location: [facility];  Service: Plastics;  Laterality: Right;   CHOLECYSTECTOMY  1987   COLONOSCOPY     ent surgery  2004   correct snoring   INGUINAL HERNIA REPAIR     left   INSERT / REPLACE / REMOVE PACEMAKER  04/14/2013   dual chamber   / Dr Sallyanne Kuster   MINOR IRRIGATION AND DEBRIDEMENT OF WOUND Right 03/04/2013   Procedure: MINOR IRRIGATION AND DEBRIDEMENT OF WOUND;  Surgeon: Theodoro Kos, DO;  Location: [facility];  Service: Plastics;  Laterality: Right;   ORCHIECTOMY  2002   PERMANENT PACEMAKER INSERTION N/A 04/14/2013   Procedure: PERMANENT PACEMAKER INSERTION;  Surgeon: [name], MD;  Location: [facility] C[facility];  Service: Cardiovascular;  Laterality: N/A;   SKIN SPLIT GRAFT Right 08/12/2013   Procedure: SKIN GRAFT SPLIT THICKNESS WITH PLACEMENT OF VAC TO RIGHT LOWER LEG/PLACEMENT OF ACELL TO RIGHT UPPER THIGH AREA (HARVEST SITE);  Surgeon: Theodoro Kos, DO;  Location:  [facility]ERY  [facility];  Service: Clinical cytogeneticist;  Laterality: Right;     Home Medications:  Prior to Admission medications   Medication Sig Start Date End Date Taking? Authorizing Provider  albuterol (VENTOLIN HFA) 108 (90 Base) MCG/ACT inhaler Inhale 2 puffs into the lungs every 6 (six) hours as needed for wheezing or shortness of breath. 02/04/19  Yes Burchette, Alinda Sierras, MD  amLODipine (NORVASC) 5  MG tablet TAKE 1 TABLET EVERY DAY Patient taking differently: Take 5 mg by mouth at bedtime.  12/30/18  Yes Burchette, Alinda Sierras, MD  apixaban (ELIQUIS) 2.5 MG TABS tablet Take 1 tablet (2.5 mg total) by mouth 2 (two) times daily. 03/03/19  Yes Nashay Brickley, MD  diclofenac sodium (VOLTAREN) 1 % GEL Apply 4 g topically 4 (four) times daily. Patient taking differently: Apply 4 g topically 4 (four) times daily as needed (knee pain).  02/24/19  Yes Burchette, Alinda Sierras, MD  losartan (COZAAR) 50 MG tablet Take 1 tablet (50 mg total) by mouth daily. 01/22/19  Yes Burchette, Alinda Sierras, MD  metoprolol tartrate (LOPRESSOR) 50 MG tablet Take 1 tablet (50 mg total) by mouth 2 (two) times daily. 01/22/19  Yes Burchette, Alinda Sierras, MD  Multiple Vitamin (MULTIVITAMIN WITH MINERALS) TABS tablet Take 1 tablet by mouth daily.   Yes [provider]  simvastatin (ZOCOR) 40 MG tablet Take 1 tablet (40 mg total) by mouth daily. 12/30/18  Yes Burchette, Alinda Sierras, MD  XTANDI 40 MG capsule Take 160 mg by mouth daily.  11/13/17  Yes [provider]    Inpatient Medications: Scheduled Meds:   stroke: mapping our early stages of recovery book   Does not apply Once   apixaban  2.5 mg Oral BID   aspirin EC  81 mg Oral Daily   atorvastatin  40 mg Oral q1800   insulin aspart  0-5 Units Subcutaneous QHS   insulin aspart  0-9 Units Subcutaneous TID WC   sodium chloride flush  3 mL Intravenous Once   Continuous Infusions:  sodium chloride     PRN Meds: acetaminophen **OR** acetaminophen (TYLENOL) oral liquid 160 mg/5 mL **OR** acetaminophen, albuterol, senna-docusate  Allergies:   No Known Allergies  Social History:   Social History   Socioeconomic History   Marital status: Married    Spouse name: Consulting civil engineer   Number of children: Not on file   Years of education: Not on file   Highest education level: Not on file  Occupational History   Occupation: Retired   Occupation: RETIRED     Fish farm manager: RETIRED  Social Designer, fashion/clothing strain: Not on file   Food insecurity    Worry: Not on file    Inability: Not on Lexicographer needs    Medical: Not on file    Non-medical: Not on file  Tobacco Use   Smoking status: Current Some Day Smoker    Packs/day: 0.50    Years: 30.00    Pack years: 15.00    Types: Cigarettes   Smokeless tobacco: Never Used   Tobacco comment:  pt stated he smokes 1 pack every 2 wks  Substance and Sexual Activity   Alcohol use: No   Drug use: No   Sexual activity: Not on file    Comment: started smoking 2-3 cig daily  Lifestyle   Physical activity    Days per week: Not on file    Minutes per session: Not on file   Stress: Not on file  Relationships  Social Herbalist on phone: Not on file    Gets together: Not on file    Attends religious service: Not on file    Active member of club or organization: Not on file    Attends meetings of clubs or organizations: Not on file    Relationship status: Not on file   Intimate partner violence    Fear of current or ex partner: Not on file    Emotionally abused: Not on file    Physically abused: Not on file    Forced sexual activity: Not on file  Other Topics Concern   Not on file  Social History Narrative   Not on file    Family History:    Family History  Problem Relation Age of Onset   Prostate cancer Father      ROS:  Please see the history of present illness.  ROS  All other ROS reviewed and negative.     Physical Exam/Data:   Vitals:   03/18/19 2300 03/19/19 0326 03/19/19 0408 03/19/19 0642  BP: (!) 177/84 (!) 154/98 (!) 171/91 (!) 174/98  Pulse: 62 61 62 62  Resp: 20 18 14 17   Temp: 97.9 F (36.6 C)   97.8 F (36.6 C)  TempSrc: Oral   Oral  SpO2: 97% 98% 98% 100%   No intake or output data in the 24 hours ending 03/19/19 1058 There were no vitals filed for this visit. There is no height or weight on file to calculate BMI.    General: Elderly male, no acute distress, resting comfortably HEENT: Normocephalic, nontraumatic Lymph: no adenopathy Neck: No JVD Endocrine:  No thryomegaly Vascular: No carotid bruits; FA pulses 2+ bilaterally without bruits  Cardiac: RRR, no murmurs rubs or gallops  Lungs:  clear to auscultation bilaterally, no wheezing, rhonchi or rales  Abd: soft, nontender, no hepatomegaly  Ext: no edema Musculoskeletal:  No deformities, BUE and BLE strength normal and equal Skin: warm and dry  Neuro:  CNs 2-12 intact, no focal abnormalities noted Psych:  Normal affect   EKG:  The EKG was personally reviewed and demonstrates:  Normal sinus rhythm, HR around 60 Telemetry:  Telemetry was personally reviewed and demonstrates:  NSR  Relevant CV Studies: Echocardiogram 03/18/19: IMPRESSIONS    1. Left ventricular ejection fraction, by visual estimation, is 60 to 65%. The left ventricle has normal function. There is moderately increased left ventricular hypertrophy of the basal septum.  2. There is akinesis of the basal inferior wall.  3. Left ventricular diastolic parameters are consistent with Grade I diastolic dysfunction (impaired relaxation).  4. The right ventricular size is not well visualized. No increase in right ventricular wall thickness.  5. Left atrial size was normal.  6. Right atrial size was normal.  7. The mitral valve is normal in structure. Mild mitral valve regurgitation. No evidence of mitral stenosis.  8. The tricuspid valve is normal in structure. Tricuspid valve regurgitation is not demonstrated.  9. The aortic valve is normal in structure. Aortic valve regurgitation is mild. Mild aortic valve sclerosis without stenosis. 10. The pulmonic valve was normal in structure. Pulmonic valve regurgitation is trivial. 11. The inferior vena cava is normal in size with greater than 50% respiratory variability, suggesting right atrial pressure of 3 mmHg. 12. TR signal is inadequate for  assessing pulmonary artery systolic pressure.  Laboratory Data:  Chemistry Recent Labs  Lab 03/18/19 1308 03/18/19 1315  NA 144 146*  K 4.1 4.0  CL 112* 112*  CO2 20*  --   GLUCOSE 115* 110*  BUN 20 22  CREATININE 2.00* 2.00*  CALCIUM 10.3  --   GFRNONAA 30*  --   GFRAA 35*  --   ANIONGAP 12  --     Recent Labs  Lab 03/18/19 1308  PROT 6.5  ALBUMIN 3.4*  AST 18  ALT 12  ALKPHOS 128*  BILITOT 0.4   Hematology Recent Labs  Lab 03/18/19 1308 03/18/19 1315  WBC 9.9  --   RBC 4.52  --   HGB 13.6 13.9  HCT 43.4 41.0  MCV 96.0  --   MCH 30.1  --   MCHC 31.3  --   RDW 14.5  --   PLT 184  --    Cardiac EnzymesNo results for input(s): TROPONINI in the last 168 hours. No results for input(s): TROPIPOC in the last 168 hours.  BNPNo results for input(s): BNP, PROBNP in the last 168 hours.  DDimer No results for input(s): DDIMER in the last 168 hours.  Radiology/Studies:  Ct Angio Head W Or Wo Contrast  Result Date: 03/18/2019 CLINICAL DATA:  Stroke.  Right-sided weakness now resolved EXAM: CT ANGIOGRAPHY HEAD AND NECK TECHNIQUE: Multidetector CT imaging of the head and neck was performed using the standard protocol during bolus administration of intravenous contrast. Multiplanar CT image reconstructions and MIPs were obtained to evaluate the vascular anatomy. Carotid stenosis measurements (when applicable) are obtained utilizing NASCET criteria, using the distal internal carotid diameter as the denominator. CONTRAST:  43mL OMNIPAQUE IOHEXOL 350 MG/ML SOLN COMPARISON:  Brain MRI 06/11/2004, MRA head 06/11/2004 FINDINGS: CTA NECK FINDINGS Aortic arch: The left vertebral artery arises directly from the aortic arch. Visualized portions of the aortic arch demonstrate no evidence of dissection or aneurysm. Scattered soft and calcified plaque within the visualized aortic arch and proximal major branch vessels of the neck. Right carotid system: CCA and ICA patent within the neck  without measurable stenosis. Mild soft and calcified plaque at the carotid bifurcation and within the proximal ICA. Left carotid system: CCA and ICA patent within the neck without measurable stenosis. Mild mixed plaque at the carotid bifurcation. Vertebral arteries: The right vertebral artery is significantly dominant mixed plaque at the right vertebral artery origin with resultant mild ostial stenosis. Distal to this, the right vertebral artery is patent within the neck without significant stenosis (50% or greater). The non dominant left vertebral artery is markedly diminutive with atherosclerotic irregularity, but appears patent within the neck. Skeleton: Cervical spondylosis without high-grade bony spinal canal narrowing Other neck: 2.2 cm left thyroid lobe nodule. Otherwise, no soft tissue neck mass or pathologically enlarged cervical chain lymph nodes Upper chest: No consolidation within the imaged lung apices. Review of the MIP images confirms the above findings CTA HEAD FINDINGS Anterior circulation: Calcified plaque within the intracranial right internal carotid artery. Moderate stenosis of the paraclinoid segment with otherwise no more than mild stenosis. Calcified plaque within the intracranial left internal carotid artery. Moderate/severe stenosis within the paraclinoid left ICA. The right middle cerebral artery is patent with atherosclerotic irregularity. Moderate focal stenosis within the mid to distal M1 right MCA. Atherosclerotic irregularity of the M2 and more distal right MCA branches without vessel occlusion or high-grade proximal stenosis identified. The left middle cerebral artery is patent with atherosclerotic irregularity. Moderate focal stenosis within the distal M1 left middle cerebral artery. Atherosclerotic irregularity of the M2 and more distal left MCA branches without vessel occlusion or high-grade proximal  stenosis identified. The bilateral anterior cerebral arteries are patent without  significant proximal stenosis. No intracranial aneurysm is identified. Posterior circulation: The dominant intracranial right vertebral artery is patent with mild stenosis just proximal to the vertebrobasilar junction. The non dominant and diminutive intracranial left vertebral artery is patent with atherosclerotic irregularity and sites of up to moderate stenosis. The basilar artery is patent with mild atherosclerotic irregularity. Predominantly fetal origin of the right posterior cerebral artery which is patent. Moderate focal stenosis within the distal P2 right posterior cerebral artery. Predominantly fetal origin of the left posterior cerebral artery. There is significant atherosclerotic irregularity of the left posterior communicating artery. Moderate stenosis within the mid P2 left posterior cerebral artery. Venous sinuses: Within limitations of contrast timing, no convincing thrombus. Anatomic variants: As described Review of the MIP images confirms the above findings These results were called by telephone at the time of interpretation on 03/18/2019 at 1:32 pm to provider [facility] , who verbally acknowledged these results. IMPRESSION: CTA neck: 1. Common and internal carotid arteries patent within the neck without measurable stenosis. 2. Significant dominant right vertebral artery without flow-limiting stenosis. Mixed plaque results in mild ostial stenosis. 3. A non dominant and developmentally diminutive left vertebral artery demonstrates significant atherosclerotic irregularity but appears patent throughout the neck. 4. 2.2 cm left thyroid lobe nodule. Dedicated thyroid ultrasound recommended for further evaluation. CTA head: 1. Significant intracranial atherosclerotic disease with multifocal stenoses, most notably as follows. 2. Moderate/severe stenosis of the paraclinoid left ICA. 3. Moderate stenosis of the paraclinoid right ICA. 4. Moderate focal stenoses within the bilateral M1 middle cerebral  arteries. 5. Predominantly fetal origin of the left PCA with significant atherosclerotic irregularity of the left posterior communicating artery. 6. Additional mild and moderate stenoses as detailed. Electronically Signed   By: Kellie Simmering DO   On: 03/18/2019 14:07   Ct Angio Neck W Or Wo Contrast  Result Date: 03/18/2019 CLINICAL DATA:  Stroke.  Right-sided weakness now resolved EXAM: CT ANGIOGRAPHY HEAD AND NECK TECHNIQUE: Multidetector CT imaging of the head and neck was performed using the standard protocol during bolus administration of intravenous contrast. Multiplanar CT image reconstructions and MIPs were obtained to evaluate the vascular anatomy. Carotid stenosis measurements (when applicable) are obtained utilizing NASCET criteria, using the distal internal carotid diameter as the denominator. CONTRAST:  79mL OMNIPAQUE IOHEXOL 350 MG/ML SOLN COMPARISON:  Brain MRI 06/11/2004, MRA head 06/11/2004 FINDINGS: CTA NECK FINDINGS Aortic arch: The left vertebral artery arises directly from the aortic arch. Visualized portions of the aortic arch demonstrate no evidence of dissection or aneurysm. Scattered soft and calcified plaque within the visualized aortic arch and proximal major branch vessels of the neck. Right carotid system: CCA and ICA patent within the neck without measurable stenosis. Mild soft and calcified plaque at the carotid bifurcation and within the proximal ICA. Left carotid system: CCA and ICA patent within the neck without measurable stenosis. Mild mixed plaque at the carotid bifurcation. Vertebral arteries: The right vertebral artery is significantly dominant mixed plaque at the right vertebral artery origin with resultant mild ostial stenosis. Distal to this, the right vertebral artery is patent within the neck without significant stenosis (50% or greater). The non dominant left vertebral artery is markedly diminutive with atherosclerotic irregularity, but appears patent within the neck.  Skeleton: Cervical spondylosis without high-grade bony spinal canal narrowing Other neck: 2.2 cm left thyroid lobe nodule. Otherwise, no soft tissue neck mass or pathologically enlarged cervical chain lymph nodes Upper chest:  No consolidation within the imaged lung apices. Review of the MIP images confirms the above findings CTA HEAD FINDINGS Anterior circulation: Calcified plaque within the intracranial right internal carotid artery. Moderate stenosis of the paraclinoid segment with otherwise no more than mild stenosis. Calcified plaque within the intracranial left internal carotid artery. Moderate/severe stenosis within the paraclinoid left ICA. The right middle cerebral artery is patent with atherosclerotic irregularity. Moderate focal stenosis within the mid to distal M1 right MCA. Atherosclerotic irregularity of the M2 and more distal right MCA branches without vessel occlusion or high-grade proximal stenosis identified. The left middle cerebral artery is patent with atherosclerotic irregularity. Moderate focal stenosis within the distal M1 left middle cerebral artery. Atherosclerotic irregularity of the M2 and more distal left MCA branches without vessel occlusion or high-grade proximal stenosis identified. The bilateral anterior cerebral arteries are patent without significant proximal stenosis. No intracranial aneurysm is identified. Posterior circulation: The dominant intracranial right vertebral artery is patent with mild stenosis just proximal to the vertebrobasilar junction. The non dominant and diminutive intracranial left vertebral artery is patent with atherosclerotic irregularity and sites of up to moderate stenosis. The basilar artery is patent with mild atherosclerotic irregularity. Predominantly fetal origin of the right posterior cerebral artery which is patent. Moderate focal stenosis within the distal P2 right posterior cerebral artery. Predominantly fetal origin of the left posterior cerebral  artery. There is significant atherosclerotic irregularity of the left posterior communicating artery. Moderate stenosis within the mid P2 left posterior cerebral artery. Venous sinuses: Within limitations of contrast timing, no convincing thrombus. Anatomic variants: As described Review of the MIP images confirms the above findings These results were called by telephone at the time of interpretation on 03/18/2019 at 1:32 pm to provider [facility] , who verbally acknowledged these results. IMPRESSION: CTA neck: 1. Common and internal carotid arteries patent within the neck without measurable stenosis. 2. Significant dominant right vertebral artery without flow-limiting stenosis. Mixed plaque results in mild ostial stenosis. 3. A non dominant and developmentally diminutive left vertebral artery demonstrates significant atherosclerotic irregularity but appears patent throughout the neck. 4. 2.2 cm left thyroid lobe nodule. Dedicated thyroid ultrasound recommended for further evaluation. CTA head: 1. Significant intracranial atherosclerotic disease with multifocal stenoses, most notably as follows. 2. Moderate/severe stenosis of the paraclinoid left ICA. 3. Moderate stenosis of the paraclinoid right ICA. 4. Moderate focal stenoses within the bilateral M1 middle cerebral arteries. 5. Predominantly fetal origin of the left PCA with significant atherosclerotic irregularity of the left posterior communicating artery. 6. Additional mild and moderate stenoses as detailed. Electronically Signed   By: Kellie Simmering DO   On: 03/18/2019 14:07   Ct Head Code Stroke Wo Contrast  Result Date: 03/18/2019 CLINICAL DATA:  Code stroke. Focal neuro deficit, less than 6 hours, stroke suspected. Last known normal 11 a.m., left-sided weakness EXAM: CT HEAD WITHOUT CONTRAST TECHNIQUE: Contiguous axial images were obtained from the base of the skull through the vertex without intravenous contrast. COMPARISON:  Brain MRI 06/11/2004  FINDINGS: Brain: There is no acute intracranial hemorrhage. No demarcated cortical infarction is identified. Severe patchy hypodensity within the bilateral cerebral white matter which is nonspecific, but consistent with advanced chronic small vessel ischemic disease. Chronic lacunar infarct within the left periatrial white matter. There are numerous lacunar infarcts within the bilateral basal ganglia and thalami, some of which appear chronic and some of which are age-indeterminate. No evidence of intracranial mass. No midline shift or extra-axial fluid collection. Mild generalized parenchymal atrophy. Vascular:  No definite hyperdense vessel. Atherosclerotic calcifications. Skull: No calvarial fracture or suspicious osseous lesion Sinuses/Orbits: Visualized orbits demonstrate no acute abnormality. No significant paranasal sinus disease or mastoid effusion at the imaged levels These results were called by telephone at the time of interpretation on 03/18/2019 at 1:29 pm to provider Dr. Rory Percy, who verbally acknowledged these results. IMPRESSION: 1. No acute intracranial hemorrhage or demarcated cortical infarction 2. Advanced chronic small vessel ischemic disease. Numerous lacunar infarcts within the bilateral basal ganglia and thalami, some of which appear chronic and others which are age indeterminate. Consider brain MRI for further evaluation. Electronically Signed   By: Kellie Simmering DO   On: 03/18/2019 13:30    Assessment and Plan:   1. Paroxsymal atrial fibrillation, sinus syndrome s/p pacemaker in 2014: Patient reports compliance with eliquis, is currently in sinus rhythm. Download of his dual chamber pacemaker showed that he has been in atrial fibrillation <1% over the past year, last episode was 1 month ago. Pacemaker was adjusted to have his MRI, will need to be reset following the MRI. It seems unlikely that his TIA/CVA was related to his atrial fibrillation. Currently rate controlled, would resume home  metoprolol, amlodipine, and losartan when he is past permissive hypertension time.   -Continue eliquis -Continue telemetry -Resume prior pacemaker setting following MRI  2. CVA/TIA: Patient reported improvement in his speech difficulties and arm weakness. CT head was negative for any acute abnormalities. Echocardiogram unremarkable. Obtaining stroke work up. Neurology following.  -Continuing atorvastatin -F/u MRI -PT/OT  3. HTN: On amlodipine, metoprolol and losartan at home. Currently hypertensive, allowing for permissive hypertension in setting of TIA/CVA.    4. CKD stage 3: Cr 2.4, clse to baseline.   For questions or updates, please contact [place] Please consult [email or web address] for contact info under Cardiology/STEMI.   Signed, Asencion Noble, MD  03/19/2019 10:58 AM   I have seen and examined the patient along with Asencion Noble, MD.   I have reviewed the chart, notes and new data.  I agree with PA/NP's note.  Key new complaints: His neurological complaints seem to have resolved completely.  Had aphasia and right-sided weakness of the upper extremity, distant with a left MCA ischemic event.  No other cardiovascular complaints.  He generally reports nearly 100% compliance with Eliquis, although he did miss 1 dose yesterday. Key examination changes: Lying fully supine in bed, looks really comfortable, no evidence of dyspnea, no jugular venous distention, regular rate and rhythm, normal S1 and S2 without significant murmurs rubs or gallops.  No edema, intact distal pulses. 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 Comprehensive pacemaker interrogation was performed and shows normal device function.  Lead parameters are excellent and has an estimated 3.5 years of generator longevity.  Overall burden of atrial fibrillation is less  than 0.1%, with all the recent events lasting between 30 seconds and 2 minutes.  Has occasional mild rapid ventricular response.  One episode of relatively lengthy nonsustained ventricular tachycardia was seen in December 2019, none since.  Heart rate histograms suggest adequate rate response settings.  PLAN: He has an MRI conditional Med[employer] Azure device with 5076 leads.  He is not pacemaker dependent.  The appropriate setting changes have been made to allow MRI.  While cardioembolic TIA related to atrial fibrillation is a possibility, this would appear to  be unlikely during almost perfect compliance with anticoagulation and the burden of arrhythmia of only 0.1%, much less recently.  Would continue investigating the possibility that his events were related to intracranial atherosclerotic disease.  It is possible that he will require combined antiplatelet/anticoagulation therapy.  Note LDL cholesterol of 100 (target less than 70).  Recommend switching from his home prescription of simvastatin to rosuvastatin.  This will also avoid the potential interaction with amlodipine.  [name], MD, [place] [phone] 03/19/2019, 12:53 PM

## 2019-03-19 NOTE — ED Notes (Signed)
Pt returned from MRI °

## 2019-03-19 NOTE — Progress Notes (Signed)
[name] [id number] Admission Data: 03/19/2019 4:55 PM Attending Provider: Geradine Girt, DO  [id number], Joseph Sierras, MD Consults/ Treatment Team: Treatment Team:  [facility], Joseph Kava, MD  [name] is a 85 y.o. male patient admitted from ED awake, alert  & orientated  X 3,  Full Code, VSS - Blood pressure (!) 165/106, pulse 61, temperature 97.9 F (36.6 C), temperature source Oral, resp. rate 17, SpO2 100 %., room air , no c/o shortness of breath, no c/o chest pain, no distress noted. Tele #[id number] placed and pt is currently running:normal sinus rhythm, paced.   IV site WDL:  Left hand, left ac with a transparent dsg that's clean dry and intact.  Allergies:  No Known Allergies   Past Medical History:  Diagnosis Date  . Disturbances of sulphur-bearing amino-acid metabolism   . Malignant neoplasm of prostate ([place])   . Obstructive sleep apnea (adult) (pediatric) 2004   surgery to coorrect  . Osteoarthrosis, unspecified whether generalized or localized, unspecified site   . Other and unspecified hyperlipidemia   . Other specified cardiac dysrhythmias(427.89)   . 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 Paroxysmal atrial fibrillation ([facility])   . Unspecified cerebral artery occlusion with cerebral infarction   . Unspecified disorder resulting from impaired renal function   . Unspecified essential hypertension   . Wears glasses   . Wears partial dentures    upper partial    History:  obtained from chart review. Tobacco/alcohol: Smoked 10 packs per day for 63 years none  Pt orientation to unit, room and routine. Information packet given to patient/family and safety video watched.  Admission INP armband ID verified with patient/family, and in place. SR up x 2, fall risk assessment complete with Patient and family verbalizing understanding of risks associated with falls. Pt verbalizes an understanding of how to use the call bell and to call for help before getting  out of bed.  Skin, clean-dry- intact without evidence of bruising, or skin tears.   No evidence of skin break down noted on exam. As charted.    Will cont to monitor and assist as needed.  Joseph Rowan, RN 03/19/2019 4:55 PM

## 2019-03-19 NOTE — Evaluation (Signed)
Physical Therapy Evaluation Patient Details Name: [name] MRN: [id number] DOB: 1932/08/19 Today's Date: 03/19/2019   History of Present Illness  [name] is a 85 y.o. male past medical history of atrial fibrillation on Eliquis last dose taken at 9 AM today, tachybradycardia syndrome status post pacemaker, hypertension, hyperlipidemia, prostate cancer, presented to the emergency room for evaluation of sudden onset of inability to talk and right-sided weakness.  Clinical Impression  Patient presents with mobility close to his baseline.  Demonstrates some slower speed and mild shuffling of feet.  Educated in fall prevention and in stroke warning signs/symptoms.  Feel patient stable for home when medically ready without follow up PT.  Will sign off, but encouraged pt to ambulate with nursing assist due to IV, lines.     Follow Up Recommendations No PT follow up    Equipment Recommendations  None recommended by PT    Recommendations for Other Services       Precautions / Restrictions Precautions Precautions: Fall      Mobility  Bed Mobility Overal bed mobility: Modified Independent                Transfers Overall transfer level: Modified independent                  Ambulation/Gait Ambulation/Gait assistance: Modified independent (Device/Increase time) Gait Distance (Feet): 128 Feet Assistive device: None Gait Pattern/deviations: Step-through pattern;Decreased stride length;Shuffle     General Gait Details: feels walking as he typically does noted some shorter stride lengths and some shuffling pattern, no LOB  Stairs            Wheelchair Mobility    Modified Rankin (Stroke Patients Only) Modified Rankin (Stroke Patients Only) Pre-Morbid Rankin Score: Slight disability Modified Rankin: Moderate disability     Balance Overall balance assessment: Needs assistance   Sitting balance-Leahy Scale: Good       Standing  balance-Leahy Scale: Good Standing balance comment: walking no device, able to step over glove boxes, able to stop and turn around                             Pertinent Vitals/Pain Pain Assessment: No/denies pain    Home Living Family/patient expects to be discharged to:: Private residence Living Arrangements: Spouse/significant other Available Help at Discharge: Family Type of Home: House Home Access: Stairs to enter   [employer] of Steps: 4 at front, 2 in back, 1 at garage/regular entrance Home Layout: Two level;Bed/bath upstairs Home Equipment: None      Prior Function Level of Independence: Independent         Comments: mows grass, still drives     Hand Dominance        Extremity/Trunk Assessment   Upper Extremity Assessment Upper Extremity Assessment: Overall WFL for tasks assessed    Lower Extremity Assessment Lower Extremity Assessment: Overall WFL for tasks assessed    Cervical / Trunk Assessment Cervical / Trunk Assessment: Kyphotic  Communication   Communication: No difficulties;HOH  Cognition Arousal/Alertness: Awake/alert Behavior During Therapy: Flat affect Overall Cognitive Status: Within Functional Limits for tasks assessed                                        General Comments General comments (skin integrity, edema, etc.): Educated on fall prevention, importance of maintaining activities, stroke  warning signs/symptoms.    Exercises     Assessment/Plan    PT Assessment Patent does not need any further PT services  PT Problem List         PT Treatment Interventions      PT Goals (Current goals can be found in the Care Plan section)  Acute Rehab PT Goals PT Goal Formulation: All assessment and education complete, DC therapy    Frequency     Barriers to discharge        Co-evaluation               AM-PAC PT "6 Clicks" Mobility  Outcome Measure Help needed turning from your back  to your side while in a flat bed without using bedrails?: None Help needed moving from lying on your back to sitting on the side of a flat bed without using bedrails?: None Help needed moving to and from a bed to a chair (including a wheelchair)?: None Help needed standing up from a chair using your arms (e.g., wheelchair or bedside chair)?: None Help needed to walk in hospital room?: None Help needed climbing 3-5 steps with a railing? : None 6 Click Score: 24    End of Session   Activity Tolerance: Patient tolerated treatment well Patient left: in bed;with call bell/phone within reach        Time: 1158-1221 PT Time Calculation (min) (ACUTE ONLY): 23 min   Charges:   PT Evaluation $PT Eval Low Complexity: 1 Low PT Treatments $Self Care/Home Management: [place], [place] [phone] 03/19/2019   Reginia Naas 03/19/2019, 1:20 PM

## 2019-03-20 DIAGNOSIS — I10 Essential (primary) hypertension: Secondary | ICD-10-CM | POA: Diagnosis not present

## 2019-03-20 DIAGNOSIS — I495 Sick sinus syndrome: Secondary | ICD-10-CM | POA: Diagnosis not present

## 2019-03-20 DIAGNOSIS — I6522 Occlusion and stenosis of left carotid artery: Secondary | ICD-10-CM | POA: Diagnosis not present

## 2019-03-20 DIAGNOSIS — I480 Paroxysmal atrial fibrillation: Secondary | ICD-10-CM | POA: Diagnosis not present

## 2019-03-20 DIAGNOSIS — E7800 Pure hypercholesterolemia, unspecified: Secondary | ICD-10-CM | POA: Diagnosis not present

## 2019-03-20 DIAGNOSIS — Z950 Presence of cardiac pacemaker: Secondary | ICD-10-CM | POA: Diagnosis not present

## 2019-03-20 DIAGNOSIS — I639 Cerebral infarction, unspecified: Secondary | ICD-10-CM | POA: Diagnosis not present

## 2019-03-20 DIAGNOSIS — N1832 Chronic kidney disease, stage 3b: Secondary | ICD-10-CM | POA: Diagnosis not present

## 2019-03-20 LAB — GLUCOSE, CAPILLARY: Glucose-Capillary: 98 mg/dL (ref 70–99)

## 2019-03-20 MED ORDER — METOPROLOL TARTRATE 25 MG PO TABS: 25.0000 mg | ORAL_TABLET | Freq: Two times a day (BID) | ORAL | 0 refills | Status: DC

## 2019-03-20 MED ORDER — ASPIRIN 81 MG PO TBEC: 81.0000 mg | DELAYED_RELEASE_TABLET | Freq: Every day | ORAL | 0 refills | Status: AC

## 2019-03-20 NOTE — Progress Notes (Addendum)
Progress Note  Patient Name: [name] Date of Encounter: 03/20/2019  Primary Cardiologist: [name], MD   Subjective   [name] reports that he is feeling well today, denies any chest pain, SOB, headaches, light headedness or dizziness. Reports that his weakness is doing okay today.   Inpatient Medications    Scheduled Meds:   stroke: mapping our early stages of recovery book   Does not apply Once   apixaban  2.5 mg Oral BID   aspirin EC  81 mg Oral Daily   insulin aspart  0-5 Units Subcutaneous QHS   insulin aspart  0-9 Units Subcutaneous TID WC   rosuvastatin  20 mg Oral q1800   sodium chloride flush  3 mL Intravenous Once   Continuous Infusions:  PRN Meds: acetaminophen **OR** acetaminophen (TYLENOL) oral liquid 160 mg/5 mL **OR** acetaminophen, albuterol, senna-docusate   Vital Signs    Vitals:   03/19/19 1600 03/19/19 1654 03/19/19 2202 03/20/19 0519  BP: (!) 162/95 (!) 165/106 133/67 119/84  Pulse: (!) 58 61 60 75  Resp: (!) 21 17 18    Temp:  97.9 F (36.6 C) 98.5 F (36.9 C) 98.3 F (36.8 C)  TempSrc:  Oral Oral Oral  SpO2: 99% 100% 99% 96%    Intake/Output Summary (Last 24 hours) at 03/20/2019 0853 Last data filed at 03/20/2019 0604 Gross per 24 hour  Intake 891.32 ml  Output --  Net 891.32 ml   There were no vitals filed for this visit.  Telemetry    NSR, paced - Personally Reviewed  ECG    None today  Physical Exam   GEN: No acute distress. Elderly male Neck: No JVD Cardiac: RRR, no murmurs, rubs, or gallops.  Respiratory: Clear to auscultation bilaterally. GI: Soft, nontender, non-distended  MS: No edema; No deformity. Neuro:  Nonfocal  Psych: Normal affect   Labs    Chemistry Recent Labs  Lab 03/18/19 1308 03/18/19 1315  NA 144 146*  K 4.1 4.0  CL 112* 112*  CO2 20*  --   GLUCOSE 115* 110*  BUN 20 22  CREATININE 2.00* 2.00*  CALCIUM 10.3  --   PROT 6.5  --   ALBUMIN 3.4*  --   AST 18  --    ALT 12  --   ALKPHOS 128*  --   BILITOT 0.4  --   GFRNONAA 30*  --   GFRAA 35*  --   ANIONGAP 12  --      Hematology Recent Labs  Lab 03/18/19 1308 03/18/19 1315  WBC 9.9  --   RBC 4.52  --   HGB 13.6 13.9  HCT 43.4 41.0  MCV 96.0  --   MCH 30.1  --   MCHC 31.3  --   RDW 14.5  --   PLT 184  --     Cardiac EnzymesNo results for input(s): TROPONINI in the last 168 hours. No results for input(s): TROPIPOC in the last 168 hours.   BNPNo results for input(s): BNP, PROBNP in the last 168 hours.   DDimer No results for input(s): DDIMER in the last 168 hours.   Radiology    Ct Angio Head W Or Wo Contrast  Result Date: 03/18/2019 CLINICAL DATA:  Stroke.  Right-sided weakness now resolved EXAM: CT ANGIOGRAPHY HEAD AND NECK TECHNIQUE: Multidetector CT imaging of the head and neck was performed using the standard protocol during bolus administration of intravenous contrast. Multiplanar CT image reconstructions and MIPs were obtained  to evaluate the vascular anatomy. Carotid stenosis measurements (when applicable) are obtained utilizing NASCET criteria, using the distal internal carotid diameter as the denominator. CONTRAST:  32mL OMNIPAQUE IOHEXOL 350 MG/ML SOLN COMPARISON:  Brain MRI 06/11/2004, MRA head 06/11/2004 FINDINGS: CTA NECK FINDINGS Aortic arch: The left vertebral artery arises directly from the aortic arch. Visualized portions of the aortic arch demonstrate no evidence of dissection or aneurysm. Scattered soft and calcified plaque within the visualized aortic arch and proximal major branch vessels of the neck. Right carotid system: CCA and ICA patent within the neck without measurable stenosis. Mild soft and calcified plaque at the carotid bifurcation and within the proximal ICA. Left carotid system: CCA and ICA patent within the neck without measurable stenosis. Mild mixed plaque at the carotid bifurcation. Vertebral arteries: The right vertebral artery is significantly dominant  mixed plaque at the right vertebral artery origin with resultant mild ostial stenosis. Distal to this, the right vertebral artery is patent within the neck without significant stenosis (50% or greater). The non dominant left vertebral artery is markedly diminutive with atherosclerotic irregularity, but appears patent within the neck. Skeleton: Cervical spondylosis without high-grade bony spinal canal narrowing Other neck: 2.2 cm left thyroid lobe nodule. Otherwise, no soft tissue neck mass or pathologically enlarged cervical chain lymph nodes Upper chest: No consolidation within the imaged lung apices. Review of the MIP images confirms the above findings CTA HEAD FINDINGS Anterior circulation: Calcified plaque within the intracranial right internal carotid artery. Moderate stenosis of the paraclinoid segment with otherwise no more than mild stenosis. Calcified plaque within the intracranial left internal carotid artery. Moderate/severe stenosis within the paraclinoid left ICA. The right middle cerebral artery is patent with atherosclerotic irregularity. Moderate focal stenosis within the mid to distal M1 right MCA. Atherosclerotic irregularity of the M2 and more distal right MCA branches without vessel occlusion or high-grade proximal stenosis identified. The left middle cerebral artery is patent with atherosclerotic irregularity. Moderate focal stenosis within the distal M1 left middle cerebral artery. Atherosclerotic irregularity of the M2 and more distal left MCA branches without vessel occlusion or high-grade proximal stenosis identified. The bilateral anterior cerebral arteries are patent without significant proximal stenosis. No intracranial aneurysm is identified. Posterior circulation: The dominant intracranial right vertebral artery is patent with mild stenosis just proximal to the vertebrobasilar junction. The non dominant and diminutive intracranial left vertebral artery is patent with atherosclerotic  irregularity and sites of up to moderate stenosis. The basilar artery is patent with mild atherosclerotic irregularity. Predominantly fetal origin of the right posterior cerebral artery which is patent. Moderate focal stenosis within the distal P2 right posterior cerebral artery. Predominantly fetal origin of the left posterior cerebral artery. There is significant atherosclerotic irregularity of the left posterior communicating artery. Moderate stenosis within the mid P2 left posterior cerebral artery. Venous sinuses: Within limitations of contrast timing, no convincing thrombus. Anatomic variants: As described Review of the MIP images confirms the above findings These results were called by telephone at the time of interpretation on 03/18/2019 at 1:32 pm to provider [facility] , who verbally acknowledged these results. IMPRESSION: CTA neck: 1. Common and internal carotid arteries patent within the neck without measurable stenosis. 2. Significant dominant right vertebral artery without flow-limiting stenosis. Mixed plaque results in mild ostial stenosis. 3. A non dominant and developmentally diminutive left vertebral artery demonstrates significant atherosclerotic irregularity but appears patent throughout the neck. 4. 2.2 cm left thyroid lobe nodule. Dedicated thyroid ultrasound recommended for further evaluation. CTA head:  1. Significant intracranial atherosclerotic disease with multifocal stenoses, most notably as follows. 2. Moderate/severe stenosis of the paraclinoid left ICA. 3. Moderate stenosis of the paraclinoid right ICA. 4. Moderate focal stenoses within the bilateral M1 middle cerebral arteries. 5. Predominantly fetal origin of the left PCA with significant atherosclerotic irregularity of the left posterior communicating artery. 6. Additional mild and moderate stenoses as detailed. Electronically Signed   By: Kellie Simmering DO   On: 03/18/2019 14:07   Ct Angio Neck W Or Wo Contrast  Result Date:  03/18/2019 CLINICAL DATA:  Stroke.  Right-sided weakness now resolved EXAM: CT ANGIOGRAPHY HEAD AND NECK TECHNIQUE: Multidetector CT imaging of the head and neck was performed using the standard protocol during bolus administration of intravenous contrast. Multiplanar CT image reconstructions and MIPs were obtained to evaluate the vascular anatomy. Carotid stenosis measurements (when applicable) are obtained utilizing NASCET criteria, using the distal internal carotid diameter as the denominator. CONTRAST:  13mL OMNIPAQUE IOHEXOL 350 MG/ML SOLN COMPARISON:  Brain MRI 06/11/2004, MRA head 06/11/2004 FINDINGS: CTA NECK FINDINGS Aortic arch: The left vertebral artery arises directly from the aortic arch. Visualized portions of the aortic arch demonstrate no evidence of dissection or aneurysm. Scattered soft and calcified plaque within the visualized aortic arch and proximal major branch vessels of the neck. Right carotid system: CCA and ICA patent within the neck without measurable stenosis. Mild soft and calcified plaque at the carotid bifurcation and within the proximal ICA. Left carotid system: CCA and ICA patent within the neck without measurable stenosis. Mild mixed plaque at the carotid bifurcation. Vertebral arteries: The right vertebral artery is significantly dominant mixed plaque at the right vertebral artery origin with resultant mild ostial stenosis. Distal to this, the right vertebral artery is patent within the neck without significant stenosis (50% or greater). The non dominant left vertebral artery is markedly diminutive with atherosclerotic irregularity, but appears patent within the neck. Skeleton: Cervical spondylosis without high-grade bony spinal canal narrowing Other neck: 2.2 cm left thyroid lobe nodule. Otherwise, no soft tissue neck mass or pathologically enlarged cervical chain lymph nodes Upper chest: No consolidation within the imaged lung apices. Review of the MIP images confirms the above  findings CTA HEAD FINDINGS Anterior circulation: Calcified plaque within the intracranial right internal carotid artery. Moderate stenosis of the paraclinoid segment with otherwise no more than mild stenosis. Calcified plaque within the intracranial left internal carotid artery. Moderate/severe stenosis within the paraclinoid left ICA. The right middle cerebral artery is patent with atherosclerotic irregularity. Moderate focal stenosis within the mid to distal M1 right MCA. Atherosclerotic irregularity of the M2 and more distal right MCA branches without vessel occlusion or high-grade proximal stenosis identified. The left middle cerebral artery is patent with atherosclerotic irregularity. Moderate focal stenosis within the distal M1 left middle cerebral artery. Atherosclerotic irregularity of the M2 and more distal left MCA branches without vessel occlusion or high-grade proximal stenosis identified. The bilateral anterior cerebral arteries are patent without significant proximal stenosis. No intracranial aneurysm is identified. Posterior circulation: The dominant intracranial right vertebral artery is patent with mild stenosis just proximal to the vertebrobasilar junction. The non dominant and diminutive intracranial left vertebral artery is patent with atherosclerotic irregularity and sites of up to moderate stenosis. The basilar artery is patent with mild atherosclerotic irregularity. Predominantly fetal origin of the right posterior cerebral artery which is patent. Moderate focal stenosis within the distal P2 right posterior cerebral artery. Predominantly fetal origin of the left posterior cerebral artery. There is  significant atherosclerotic irregularity of the left posterior communicating artery. Moderate stenosis within the mid P2 left posterior cerebral artery. Venous sinuses: Within limitations of contrast timing, no convincing thrombus. Anatomic variants: As described Review of the MIP images confirms the  above findings These results were called by telephone at the time of interpretation on 03/18/2019 at 1:32 pm to provider [facility] , who verbally acknowledged these results. IMPRESSION: CTA neck: 1. Common and internal carotid arteries patent within the neck without measurable stenosis. 2. Significant dominant right vertebral artery without flow-limiting stenosis. Mixed plaque results in mild ostial stenosis. 3. A non dominant and developmentally diminutive left vertebral artery demonstrates significant atherosclerotic irregularity but appears patent throughout the neck. 4. 2.2 cm left thyroid lobe nodule. Dedicated thyroid ultrasound recommended for further evaluation. CTA head: 1. Significant intracranial atherosclerotic disease with multifocal stenoses, most notably as follows. 2. Moderate/severe stenosis of the paraclinoid left ICA. 3. Moderate stenosis of the paraclinoid right ICA. 4. Moderate focal stenoses within the bilateral M1 middle cerebral arteries. 5. Predominantly fetal origin of the left PCA with significant atherosclerotic irregularity of the left posterior communicating artery. 6. Additional mild and moderate stenoses as detailed. Electronically Signed   By: Kellie Simmering DO   On: 03/18/2019 14:07   Mr Brain Wo Contrast  Result Date: 03/19/2019 CLINICAL DATA:  TIA, initial exam. Additional history provided: Episode of right-sided weakness and speech difficulty yesterday morning. On Eliquis. Rule out CVA. EXAM: MRI HEAD WITHOUT CONTRAST TECHNIQUE: Multiplanar, multiecho pulse sequences of the brain and surrounding structures were obtained without intravenous contrast. COMPARISON:  Noncontrast head CT and CT angiogram head/neck 03/18/2019, FINDINGS: Brain: There is a punctate focus of cortical diffusion weighted hyperintensity within the left superior frontal gyrus which is too small to accurately characterize on the ADC map (series 5, image 81). This may reflect a punctate acute infarct. No  evidence of acute infarction elsewhere. Moderate scattered T2/FLAIR hyperintensity within the cerebral white matter is nonspecific, but consistent with chronic small vessel ischemic disease. There is a small chronic hemorrhagic lacunar infarct posterior to the right lentiform nucleus and lateral to the right thalamus. Multiple small T2 hyperintense foci within the bilateral basal ganglia and thalami likely reflect a combination of prominent perivascular spaces and chronic lacunar infarcts. Additional small chronic lacunar infarcts within the pons and right cerebellum. Incidentally noted 7 mm dural-based extra-axial mass overlying the right parietal lobe, likely reflecting a small meningioma (series 17, image 5) (series 5, image 82). No midline shift. No extra-axial fluid collection. Mild generalized parenchymal atrophy. Vascular: Flow voids maintained within the proximal large arterial vessels. Skull and upper cervical spine: No focal marrow lesion. Incompletely assessed cervical spondylosis. Sinuses/Orbits: Visualized orbits demonstrate no acute abnormality. Minimal ethmoid sinus mucosal thickening. Trace fluid within right mastoid air cells. IMPRESSION: 1. Punctate acute cortical infarct versus artifact within the left superior frontal gyrus. 2. Moderate chronic small vessel ischemic disease with multiple chronic lacunar infarcts as described. 3. Incidentally noted 7 mm meningioma overlying the right parietal lobe. Electronically Signed   By: Kellie Simmering DO   On: 03/19/2019 15:53   Ct Head Code Stroke Wo Contrast  Result Date: 03/18/2019 CLINICAL DATA:  Code stroke. Focal neuro deficit, less than 6 hours, stroke suspected. Last known normal 11 a.m., left-sided weakness EXAM: CT HEAD WITHOUT CONTRAST TECHNIQUE: Contiguous axial images were obtained from the base of the skull through the vertex without intravenous contrast. COMPARISON:  Brain MRI 06/11/2004 FINDINGS: Brain: There is no acute intracranial  hemorrhage. No demarcated cortical infarction is identified. Severe patchy hypodensity within the bilateral cerebral white matter which is nonspecific, but consistent with advanced chronic small vessel ischemic disease. Chronic lacunar infarct within the left periatrial white matter. There are numerous lacunar infarcts within the bilateral basal ganglia and thalami, some of which appear chronic and some of which are age-indeterminate. No evidence of intracranial mass. No midline shift or extra-axial fluid collection. Mild generalized parenchymal atrophy. Vascular: No definite hyperdense vessel. Atherosclerotic calcifications. Skull: No calvarial fracture or suspicious osseous lesion Sinuses/Orbits: Visualized orbits demonstrate no acute abnormality. No significant paranasal sinus disease or mastoid effusion at the imaged levels These results were called by telephone at the time of interpretation on 03/18/2019 at 1:29 pm to provider Dr. Rory Percy, who verbally acknowledged these results. IMPRESSION: 1. No acute intracranial hemorrhage or demarcated cortical infarction 2. Advanced chronic small vessel ischemic disease. Numerous lacunar infarcts within the bilateral basal ganglia and thalami, some of which appear chronic and others which are age indeterminate. Consider brain MRI for further evaluation. Electronically Signed   By: Kellie Simmering DO   On: 03/18/2019 13:30    Cardiac Studies   Echocardiogram 03/18/19: IMPRESSIONS   1. Left ventricular ejection fraction, by visual estimation, is 60 to 65%. The left ventricle has normal function. There is moderately increased left ventricular hypertrophy of the basal septum. 2. There is akinesis of the basal inferior wall. 3. Left ventricular diastolic parameters are consistent with Grade I diastolic dysfunction (impaired relaxation). 4. The right ventricular size is not well visualized. No increase in right ventricular wall thickness. 5. Left atrial size was  normal. 6. Right atrial size was normal. 7. The mitral valve is normal in structure. Mild mitral valve regurgitation. No evidence of mitral stenosis. 8. The tricuspid valve is normal in structure. Tricuspid valve regurgitation is not demonstrated. 9. The aortic valve is normal in structure. Aortic valve regurgitation is mild. Mild aortic valve sclerosis without stenosis. 10. The pulmonic valve was normal in structure. Pulmonic valve regurgitation is trivial. 11. The inferior vena cava is normal in size with greater than 50% respiratory variability, suggesting right atrial pressure of 3 mmHg. 12. TR signal is inadequate for assessing pulmonary artery systolic pressure.  Patient Profile     85 y.o. male  hx of paroxysmal atrial fibrillation (on eliquis)/sinus syndrome s/p permanent transvenous pacemaker by Dr. Renaee Munda in 04/2013, HTN, HLD, and prior TIA who presented with right arm weakness and aphasia.   Assessment & Plan    1. Paroxsymal atrial fibrillation, sinus syndrome s/p pacemaker in 2014: Patient reports compliance with eliquis, currently in sinus rhythm that is paced. Denies any chest pain, SOB or palpitations. Download of his dual chamber pacemaker showed that he has been in atrial fibrillation <1% over the past year, last episode was 1 month ago. Pacemaker was adjusted to have his MRI, will need to be reset following the MRI. It seems unlikely that his TIA/CVA was related to his atrial fibrillation. Currently rate controlled, would resume home metoprolol, amlodipine, and losartan when he is past permissive hypertension time.   -Continue eliquis -Continue telemetry -Resume prior pacemaker setting following MRI  2. CVA/TIA: Patient reported improvement in his speech difficulties and arm weakness. CT head was negative for any acute abnormalities. Echocardiogram unremarkable. Obtaining stroke work up. MRI showed punctate acute infarct vs artifact in superior frontal gytus,  incedental 7 mm meningioma.  -Neurology following.  -Recommend switching simvastatin to rosuvastatin -PT/OT  3. HTN: On amlodipine, metoprolol  and losartan at home. Currently hypertensive, allowing for permissive hypertension in setting of TIA/CVA.  Resume home meds when out of window.  4. CKD stage 3: Cr 2.4, clse to baseline.      For questions or updates, please contact [place] Please consult [email or web address] for contact info under Cardiology/STEMI.      Signed, Asencion Noble, MD  03/20/2019, 8:53 AM     I have seen and examined the patient along with Asencion Noble, MD .  I have reviewed the chart, notes and new data.  I agree with her note.  Key new complaints: Neurological complaints have resolved completely Key examination changes: Normal cardiovascular exam Key new findings / data: Telemetry review shows very brief episode of atrial fibrillation lasting no more than a minute or 2.  PLAN: Continue apixaban. Discussed with neurology.  He will require both antiplatelet and anticoagulant therapy to prevent cardioembolic stroke related to atrial fibrillation and atheroembolic stroke from intracranial atherosclerotic carotid disease. Discussed the fact that bleeding risk does increase with the use of both types of clot preventing medications.  [facility] will sign off.   Medication Recommendations: Continue Eliquis 2.5 mg twice daily (adjusted for age and renal function) started aspirin 81 mg daily as well. Would resume his previous antihypertensive medications, unless orthostatic hypotension precludes that. If antihypertensives needs to be reduced, would continue the same dose of metoprolol which is also beneficial for arrhythmia rate control.  Can reduce the dose of amlodipine and/or losartan. Other recommendations (labs, testing, etc): Continue pacemaker remote downloads every 3 months, next one is due on December 9 Follow up as an outpatient: He is due a  yearly follow-up visit with Dr. Gwenlyn Found.  We will try to organize in the next 2-3 weeks.   [name], MD, [place] [phone] 03/20/2019, 11:46 AM

## 2019-03-20 NOTE — Progress Notes (Signed)
Continue to wait for orthostatics ordered yesterday.  Need to ensure BP 130-150.  Hope to be able to re-start BB prior to d/c Eulogio Bear DO

## 2019-03-20 NOTE — Progress Notes (Signed)
SLP Cancellation Note  Patient Details Name: [name] MRN: [id number] DOB: Nov 24, 1932   Cancelled treatment:       Reason Eval/Treat Not Completed: SLP screened, no needs identified, will sign off   Brenleigh Collet, Katherene Ponto 03/20/2019, 11:33 AM

## 2019-03-20 NOTE — Evaluation (Signed)
Occupational Therapy Evaluation Patient Details Name: [name] MRN: [id number] DOB: 1933-04-04 Today's Date: 03/20/2019    History of Present Illness KENNISON CAMPI is a 85 y.o. male past medical history of atrial fibrillation on Eliquis last dose taken at 9 AM today, tachybradycardia syndrome status post pacemaker, hypertension, hyperlipidemia, prostate cancer, presented to the emergency room for evaluation of sudden onset of inability to talk and right-sided weakness.   Clinical Impression   Patient is an 85 year old male that lives with his spouse in a two story home. Patient is independent at baseline without use of adaptive device. Patient participate in self care tasks in his room, lower body dressing, toileting and sink side hygiene without loss of balance/no deficits noted. Will discharge patient from Acute OT at this time.     Follow Up Recommendations   None    Equipment Recommendations  None by OT       Precautions / Restrictions Precautions Precautions: Fall Restrictions Weight Bearing Restrictions: No      Mobility Bed Mobility Overal bed mobility: Modified Independent             General bed mobility comments: head of bed elevated  Transfers Overall transfer level: Independent Equipment used: None             General transfer comment: patient perform sit to stand from bed and toilet without assistance    Balance Overall balance assessment: Independent   Sitting balance-Leahy Scale: Good       Standing balance-Leahy Scale: Good                             ADL either performed or assessed with clinical judgement   ADL Overall ADL's : Independent                                       General ADL Comments: pt don socks seated EOB, toilet and perform sinkside hygiene I without loss of balance or use of AD     Vision Baseline Vision/History: Wears glasses Wears Glasses: At all times Patient Visual  Report: No change from baseline          Praxis Praxis Praxis tested?: Within functional limits    Pertinent Vitals/Pain Pain Assessment: No/denies pain     Hand Dominance Left   Extremity/Trunk Assessment Upper Extremity Assessment Upper Extremity Assessment: Overall WFL for tasks assessed   Lower Extremity Assessment Lower Extremity Assessment: Defer to PT evaluation       Communication Communication Communication: No difficulties;HOH   Cognition Arousal/Alertness: Awake/alert Behavior During Therapy: WFL for tasks assessed/performed Overall Cognitive Status: Within Functional Limits for tasks assessed                                                Home Living Family/patient expects to be discharged to:: Private residence Living Arrangements: Spouse/significant other Available Help at Discharge: Family Type of Home: House Home Access: Stairs to enter [employer] of Steps: 4 at front, 2 in back, 1 at garage/regular entrance   Home Layout: Two level;Bed/bath upstairs Alternate Level Stairs-Number of Steps: 12 Alternate Level Stairs-Rails: Right Bathroom Shower/Tub: Occupational psychologist: Handicapped height Bathroom Accessibility: Yes  How Accessible: Accessible via walker Home Equipment: None          Prior Functioning/Environment Level of Independence: Independent        Comments: mows grass, still drives        OT Problem List: Impaired balance (sitting and/or standing)       AM-PAC OT "6 Clicks" Daily Activity     Outcome Measure Help from another person eating meals?: None Help from another person taking care of personal grooming?: None Help from another person toileting, which includes using toliet, bedpan, or urinal?: None Help from another person bathing (including washing, rinsing, drying)?: None Help from another person to put on and taking off regular upper body clothing?: None Help from another  person to put on and taking off regular lower body clothing?: None 6 Click Score: 24   End of Session    Activity Tolerance: Patient tolerated treatment well Patient left: in chair;with call bell/phone within reach;with chair alarm set  OT Visit Diagnosis: Unsteadiness on feet (R26.81)                Time: [id number] OT Time Calculation (min): 21 min Charges:  OT General Charges $OT Visit: 1 Visit OT Evaluation $OT Eval Moderate Complexity: 1 Mod  Shon Millet OT OT office: [place] 03/20/2019, 8:47 AM

## 2019-03-20 NOTE — Discharge Instructions (Addendum)

## 2019-03-20 NOTE — Discharge Summary (Addendum)
Physician Discharge Summary  HASCAL GARROD [id number] DOB: 1932-11-29 DOA: 03/18/2019  PCP: [name], MD  Admit date: 03/18/2019 Discharge date: 03/20/2019  Admitted From: home Discharge disposition: home   Recommendations for Outpatient Follow-Up:   1. BP goal of 130-150-- titrate BB up as tolerated to maintain a BP in this range 2. Asa added to eliquis 3. 2.2cm L thyroid nodule, [place] f/u recommended   Discharge Diagnosis:   Active Problems:   Hyperlipidemia   Essential hypertension   CKD (chronic kidney disease) stage 3, GFR 30-59 ml/min   Paroxysmal atrial fibrillation ([place])   Pacemaker - [employer], implanted 04/14/13     Discharge Condition: Improved.  Diet recommendation:  Carbohydrate-modified.  Wound care: None.  Code status: Full.   History of Present Illness:   [name] is a 85 y.o. male with medical history significant of prostate cancer, obstructive sleep apnea, hyperlipidemia, hypertension, paroxysmal atrial fibrillation on Eliquis status post dual-chamber pacemaker who was brought into the ED with the concern of slurred speech and right arm weakness which started around 11 AM.  Due to concern of a stroke, his wife called EMS.  When EMS arrived at around 12:10 PM, his symptoms had already resolved.  Patient did not have any other complaint.  No seizure activity.  He was brought into the emergency department.  Currently he is slightly lethargic and generalized weak but does not have any complaint.  He is a speech is back to normal.  Wife is sitting at the bedside.  He has a history of prior stroke.  No recent travel or any sick contact or any COVID-19 exposure.    Hospital Course by Problem:   TIA: Slurred speech/right arm weakness:  left brain TIA, likely large vessel source due to b/l ICA siphon stenosis, left more than right -MRI appears to be artifact-- doubt CVA -BP goal 130-150 -He has dual-chamber pacemaker- limited  a fib burdon  -Echocardiogram: Left ventricular ejection fraction, by visual estimation, is 60 to 65%. The left ventricle has normal function. There is moderately increased left ventricular hypertrophy of the basal septum. CTA done -LDL:100- on statin HgbA1c: 6.1 -neurology consult appreciated -on ASA 81 and eliquis   Paroxysmal atrial fibrillation status post dual-chamber pacemaker: -Resume Eliquis  CKD stage IIIb -trend -avoid nephrotoxins  Type 2 diabetes mellitus: -HgbA1c: 6.1 -diet controlled    Medical Consultants:    Cardiology neurology  Discharge Exam:   Vitals:   03/20/19 0916 03/20/19 0917  BP: (!) 148/92 139/82  Pulse: 71 72  Resp: 17 18  Temp:    SpO2: 100% 100%   Vitals:   03/20/19 0519 03/20/19 0913 03/20/19 0916 03/20/19 0917  BP: 119/84 (!) 156/83 (!) 148/92 139/82  Pulse: 75 75 71 72  Resp:  17 17 18   Temp: 98.3 F (36.8 C) 97.6 F (36.4 C)    TempSrc: Oral Oral    SpO2: 96% 100% 100% 100%    General exam: Appears calm and comfortable.    The results of significant diagnostics from this hospitalization (including imaging, microbiology, ancillary and laboratory) are listed below for reference.     Procedures and Diagnostic Studies:   Ct Angio Head W Or Wo Contrast  Result Date: 03/18/2019 CLINICAL DATA:  Stroke.  Right-sided weakness now resolved EXAM: CT ANGIOGRAPHY HEAD AND NECK TECHNIQUE: Multidetector CT imaging of the head and neck was performed using the standard protocol during bolus administration of intravenous contrast. Multiplanar CT image  reconstructions and MIPs were obtained to evaluate the vascular anatomy. Carotid stenosis measurements (when applicable) are obtained utilizing NASCET criteria, using the distal internal carotid diameter as the denominator. CONTRAST:  38mL OMNIPAQUE IOHEXOL 350 MG/ML SOLN COMPARISON:  Brain MRI 06/11/2004, MRA head 06/11/2004 FINDINGS: CTA NECK FINDINGS Aortic arch: The left vertebral  artery arises directly from the aortic arch. Visualized portions of the aortic arch demonstrate no evidence of dissection or aneurysm. Scattered soft and calcified plaque within the visualized aortic arch and proximal major branch vessels of the neck. Right carotid system: CCA and ICA patent within the neck without measurable stenosis. Mild soft and calcified plaque at the carotid bifurcation and within the proximal ICA. Left carotid system: CCA and ICA patent within the neck without measurable stenosis. Mild mixed plaque at the carotid bifurcation. Vertebral arteries: The right vertebral artery is significantly dominant mixed plaque at the right vertebral artery origin with resultant mild ostial stenosis. Distal to this, the right vertebral artery is patent within the neck without significant stenosis (50% or greater). The non dominant left vertebral artery is markedly diminutive with atherosclerotic irregularity, but appears patent within the neck. Skeleton: Cervical spondylosis without high-grade bony spinal canal narrowing Other neck: 2.2 cm left thyroid lobe nodule. Otherwise, no soft tissue neck mass or pathologically enlarged cervical chain lymph nodes Upper chest: No consolidation within the imaged lung apices. Review of the MIP images confirms the above findings CTA HEAD FINDINGS Anterior circulation: Calcified plaque within the intracranial right internal carotid artery. Moderate stenosis of the paraclinoid segment with otherwise no more than mild stenosis. Calcified plaque within the intracranial left internal carotid artery. Moderate/severe stenosis within the paraclinoid left ICA. The right middle cerebral artery is patent with atherosclerotic irregularity. Moderate focal stenosis within the mid to distal M1 right MCA. Atherosclerotic irregularity of the M2 and more distal right MCA branches without vessel occlusion or high-grade proximal stenosis identified. The left middle cerebral artery is patent  with atherosclerotic irregularity. Moderate focal stenosis within the distal M1 left middle cerebral artery. Atherosclerotic irregularity of the M2 and more distal left MCA branches without vessel occlusion or high-grade proximal stenosis identified. The bilateral anterior cerebral arteries are patent without significant proximal stenosis. No intracranial aneurysm is identified. Posterior circulation: The dominant intracranial right vertebral artery is patent with mild stenosis just proximal to the vertebrobasilar junction. The non dominant and diminutive intracranial left vertebral artery is patent with atherosclerotic irregularity and sites of up to moderate stenosis. The basilar artery is patent with mild atherosclerotic irregularity. Predominantly fetal origin of the right posterior cerebral artery which is patent. Moderate focal stenosis within the distal P2 right posterior cerebral artery. Predominantly fetal origin of the left posterior cerebral artery. There is significant atherosclerotic irregularity of the left posterior communicating artery. Moderate stenosis within the mid P2 left posterior cerebral artery. Venous sinuses: Within limitations of contrast timing, no convincing thrombus. Anatomic variants: As described Review of the MIP images confirms the above findings These results were called by telephone at the time of interpretation on 03/18/2019 at 1:32 pm to provider [facility] , who verbally acknowledged these results. IMPRESSION: CTA neck: 1. Common and internal carotid arteries patent within the neck without measurable stenosis. 2. Significant dominant right vertebral artery without flow-limiting stenosis. Mixed plaque results in mild ostial stenosis. 3. A non dominant and developmentally diminutive left vertebral artery demonstrates significant atherosclerotic irregularity but appears patent throughout the neck. 4. 2.2 cm left thyroid lobe nodule. Dedicated thyroid ultrasound recommended  for  further evaluation. CTA head: 1. Significant intracranial atherosclerotic disease with multifocal stenoses, most notably as follows. 2. Moderate/severe stenosis of the paraclinoid left ICA. 3. Moderate stenosis of the paraclinoid right ICA. 4. Moderate focal stenoses within the bilateral M1 middle cerebral arteries. 5. Predominantly fetal origin of the left PCA with significant atherosclerotic irregularity of the left posterior communicating artery. 6. Additional mild and moderate stenoses as detailed. Electronically Signed   By: Kellie Simmering DO   On: 03/18/2019 14:07   Ct Angio Neck W Or Wo Contrast  Result Date: 03/18/2019 CLINICAL DATA:  Stroke.  Right-sided weakness now resolved EXAM: CT ANGIOGRAPHY HEAD AND NECK TECHNIQUE: Multidetector CT imaging of the head and neck was performed using the standard protocol during bolus administration of intravenous contrast. Multiplanar CT image reconstructions and MIPs were obtained to evaluate the vascular anatomy. Carotid stenosis measurements (when applicable) are obtained utilizing NASCET criteria, using the distal internal carotid diameter as the denominator. CONTRAST:  29mL OMNIPAQUE IOHEXOL 350 MG/ML SOLN COMPARISON:  Brain MRI 06/11/2004, MRA head 06/11/2004 FINDINGS: CTA NECK FINDINGS Aortic arch: The left vertebral artery arises directly from the aortic arch. Visualized portions of the aortic arch demonstrate no evidence of dissection or aneurysm. Scattered soft and calcified plaque within the visualized aortic arch and proximal major branch vessels of the neck. Right carotid system: CCA and ICA patent within the neck without measurable stenosis. Mild soft and calcified plaque at the carotid bifurcation and within the proximal ICA. Left carotid system: CCA and ICA patent within the neck without measurable stenosis. Mild mixed plaque at the carotid bifurcation. Vertebral arteries: The right vertebral artery is significantly dominant mixed plaque at the right  vertebral artery origin with resultant mild ostial stenosis. Distal to this, the right vertebral artery is patent within the neck without significant stenosis (50% or greater). The non dominant left vertebral artery is markedly diminutive with atherosclerotic irregularity, but appears patent within the neck. Skeleton: Cervical spondylosis without high-grade bony spinal canal narrowing Other neck: 2.2 cm left thyroid lobe nodule. Otherwise, no soft tissue neck mass or pathologically enlarged cervical chain lymph nodes Upper chest: No consolidation within the imaged lung apices. Review of the MIP images confirms the above findings CTA HEAD FINDINGS Anterior circulation: Calcified plaque within the intracranial right internal carotid artery. Moderate stenosis of the paraclinoid segment with otherwise no more than mild stenosis. Calcified plaque within the intracranial left internal carotid artery. Moderate/severe stenosis within the paraclinoid left ICA. The right middle cerebral artery is patent with atherosclerotic irregularity. Moderate focal stenosis within the mid to distal M1 right MCA. Atherosclerotic irregularity of the M2 and more distal right MCA branches without vessel occlusion or high-grade proximal stenosis identified. The left middle cerebral artery is patent with atherosclerotic irregularity. Moderate focal stenosis within the distal M1 left middle cerebral artery. Atherosclerotic irregularity of the M2 and more distal left MCA branches without vessel occlusion or high-grade proximal stenosis identified. The bilateral anterior cerebral arteries are patent without significant proximal stenosis. No intracranial aneurysm is identified. Posterior circulation: The dominant intracranial right vertebral artery is patent with mild stenosis just proximal to the vertebrobasilar junction. The non dominant and diminutive intracranial left vertebral artery is patent with atherosclerotic irregularity and sites of up  to moderate stenosis. The basilar artery is patent with mild atherosclerotic irregularity. Predominantly fetal origin of the right posterior cerebral artery which is patent. Moderate focal stenosis within the distal P2 right posterior cerebral artery. Predominantly fetal origin of the  left posterior cerebral artery. There is significant atherosclerotic irregularity of the left posterior communicating artery. Moderate stenosis within the mid P2 left posterior cerebral artery. Venous sinuses: Within limitations of contrast timing, no convincing thrombus. Anatomic variants: As described Review of the MIP images confirms the above findings These results were called by telephone at the time of interpretation on 03/18/2019 at 1:32 pm to provider [facility] , who verbally acknowledged these results. IMPRESSION: CTA neck: 1. Common and internal carotid arteries patent within the neck without measurable stenosis. 2. Significant dominant right vertebral artery without flow-limiting stenosis. Mixed plaque results in mild ostial stenosis. 3. A non dominant and developmentally diminutive left vertebral artery demonstrates significant atherosclerotic irregularity but appears patent throughout the neck. 4. 2.2 cm left thyroid lobe nodule. Dedicated thyroid ultrasound recommended for further evaluation. CTA head: 1. Significant intracranial atherosclerotic disease with multifocal stenoses, most notably as follows. 2. Moderate/severe stenosis of the paraclinoid left ICA. 3. Moderate stenosis of the paraclinoid right ICA. 4. Moderate focal stenoses within the bilateral M1 middle cerebral arteries. 5. Predominantly fetal origin of the left PCA with significant atherosclerotic irregularity of the left posterior communicating artery. 6. Additional mild and moderate stenoses as detailed. Electronically Signed   By: Kellie Simmering DO   On: 03/18/2019 14:07   Mr Brain Wo Contrast  Result Date: 03/19/2019 CLINICAL DATA:  TIA, initial  exam. Additional history provided: Episode of right-sided weakness and speech difficulty yesterday morning. On Eliquis. Rule out CVA. EXAM: MRI HEAD WITHOUT CONTRAST TECHNIQUE: Multiplanar, multiecho pulse sequences of the brain and surrounding structures were obtained without intravenous contrast. COMPARISON:  Noncontrast head CT and CT angiogram head/neck 03/18/2019, FINDINGS: Brain: There is a punctate focus of cortical diffusion weighted hyperintensity within the left superior frontal gyrus which is too small to accurately characterize on the ADC map (series 5, image 81). This may reflect a punctate acute infarct. No evidence of acute infarction elsewhere. Moderate scattered T2/FLAIR hyperintensity within the cerebral white matter is nonspecific, but consistent with chronic small vessel ischemic disease. There is a small chronic hemorrhagic lacunar infarct posterior to the right lentiform nucleus and lateral to the right thalamus. Multiple small T2 hyperintense foci within the bilateral basal ganglia and thalami likely reflect a combination of prominent perivascular spaces and chronic lacunar infarcts. Additional small chronic lacunar infarcts within the pons and right cerebellum. Incidentally noted 7 mm dural-based extra-axial mass overlying the right parietal lobe, likely reflecting a small meningioma (series 17, image 5) (series 5, image 82). No midline shift. No extra-axial fluid collection. Mild generalized parenchymal atrophy. Vascular: Flow voids maintained within the proximal large arterial vessels. Skull and upper cervical spine: No focal marrow lesion. Incompletely assessed cervical spondylosis. Sinuses/Orbits: Visualized orbits demonstrate no acute abnormality. Minimal ethmoid sinus mucosal thickening. Trace fluid within right mastoid air cells. IMPRESSION: 1. Punctate acute cortical infarct versus artifact within the left superior frontal gyrus. 2. Moderate chronic small vessel ischemic disease with  multiple chronic lacunar infarcts as described. 3. Incidentally noted 7 mm meningioma overlying the right parietal lobe. Electronically Signed   By: Kellie Simmering DO   On: 03/19/2019 15:53   Ct Head Code Stroke Wo Contrast  Result Date: 03/18/2019 CLINICAL DATA:  Code stroke. Focal neuro deficit, less than 6 hours, stroke suspected. Last known normal 11 a.m., left-sided weakness EXAM: CT HEAD WITHOUT CONTRAST TECHNIQUE: Contiguous axial images were obtained from the base of the skull through the vertex without intravenous contrast. COMPARISON:  Brain MRI 06/11/2004 FINDINGS:  Brain: There is no acute intracranial hemorrhage. No demarcated cortical infarction is identified. Severe patchy hypodensity within the bilateral cerebral white matter which is nonspecific, but consistent with advanced chronic small vessel ischemic disease. Chronic lacunar infarct within the left periatrial white matter. There are numerous lacunar infarcts within the bilateral basal ganglia and thalami, some of which appear chronic and some of which are age-indeterminate. No evidence of intracranial mass. No midline shift or extra-axial fluid collection. Mild generalized parenchymal atrophy. Vascular: No definite hyperdense vessel. Atherosclerotic calcifications. Skull: No calvarial fracture or suspicious osseous lesion Sinuses/Orbits: Visualized orbits demonstrate no acute abnormality. No significant paranasal sinus disease or mastoid effusion at the imaged levels These results were called by telephone at the time of interpretation on 03/18/2019 at 1:29 pm to provider Dr. Rory Percy, who verbally acknowledged these results. IMPRESSION: 1. No acute intracranial hemorrhage or demarcated cortical infarction 2. Advanced chronic small vessel ischemic disease. Numerous lacunar infarcts within the bilateral basal ganglia and thalami, some of which appear chronic and others which are age indeterminate. Consider brain MRI for further evaluation.  Electronically Signed   By: Kellie Simmering DO   On: 03/18/2019 13:30     Labs:   Basic Metabolic Panel: Recent Labs  Lab 03/18/19 1308 03/18/19 1315  NA 144 146*  K 4.1 4.0  CL 112* 112*  CO2 20*  --   GLUCOSE 115* 110*  BUN 20 22  CREATININE 2.00* 2.00*  CALCIUM 10.3  --    GFR CrCl cannot be calculated (Unknown ideal weight.). Liver Function Tests: Recent Labs  Lab 03/18/19 1308  AST 18  ALT 12  ALKPHOS 128*  BILITOT 0.4  PROT 6.5  ALBUMIN 3.4*   No results for input(s): LIPASE, AMYLASE in the last 168 hours. No results for input(s): AMMONIA in the last 168 hours. Coagulation profile Recent Labs  Lab 03/18/19 1308  INR 1.1    CBC: Recent Labs  Lab 03/18/19 1308 03/18/19 1315  WBC 9.9  --   NEUTROABS 5.9  --   HGB 13.6 13.9  HCT 43.4 41.0  MCV 96.0  --   PLT 184  --    Cardiac Enzymes: No results for input(s): CKTOTAL, CKMB, CKMBINDEX, TROPONINI in the last 168 hours. BNP: Invalid input(s): POCBNP CBG: Recent Labs  Lab 03/19/19 0807 03/19/19 1255 03/19/19 1656 03/19/19 2202 03/20/19 0815  GLUCAP 82 115* 92 108* 98   D-Dimer No results for input(s): DDIMER in the last 72 hours. Hgb A1c Recent Labs    03/18/19 1308 03/19/19 0715  HGBA1C 6.2* 6.1*   Lipid Profile Recent Labs    03/19/19 0715  CHOL 168  HDL 47  LDLCALC 100*  TRIG 103  CHOLHDL 3.6   Thyroid function studies No results for input(s): TSH, T4TOTAL, T3FREE, THYROIDAB in the last 72 hours.  Invalid input(s): FREET3 Anemia work up No results for input(s): VITAMINB12, FOLATE, FERRITIN, TIBC, IRON, RETICCTPCT in the last 72 hours. Microbiology Recent Results (from the past 240 hour(s))  SARS CORONAVIRUS 2 (TAT 6-24 HRS) Nasopharyngeal Nasopharyngeal Swab     Status: None   Collection Time: 03/18/19  2:05 PM   Specimen: Nasopharyngeal Swab  Result Value Ref Range Status   SARS Coronavirus 2 NEGATIVE NEGATIVE Final    Comment: (NOTE) SARS-CoV-2 target nucleic acids  are NOT DETECTED. The SARS-CoV-2 RNA is generally detectable in upper and lower respiratory specimens during the acute phase of infection. Negative results do not preclude SARS-CoV-2 infection, do not rule out co-infections  with other pathogens, and should not be used as the sole basis for treatment or other patient management decisions. Negative results must be combined with clinical observations, patient history, and epidemiological information. The expected result is Negative. Fact Sheet for Patients: SugarRoll.be Fact Sheet for Healthcare Providers: https://www.woods-mathews.com/ This test is not yet approved or cleared by the [place] FDA and  has been authorized for detection and/or diagnosis of SARS-CoV-2 by FDA under an Emergency Use Authorization (EUA). This EUA will remain  in effect (meaning this test can be used) for the duration of the COVID-19 declaration under Section 56 4(b)(1) of the Act, 21 U.S.C. section 360bbb-3(b)(1), unless the authorization is terminated or revoked sooner. Performed at [facility], [place] [address] [place], [place] [address]      Discharge Instructions:   Discharge Instructions    Ambulatory referral to Neurology   Complete by: As directed    Follow up with stroke clinic NP (Vina Byrd Vanschaick or Cecille Rubin, if both not available, consider Zachery Dauer, or [place]) at [facility] in about 4 weeks. Thanks.   Diet Carb Modified   Complete by: As directed    Discharge instructions   Complete by: As directed    add ASA 81 on top of eliquis.  Recommend a SBP (top number on BP reading) to be 130-150   Increase activity slowly   Complete by: As directed      Allergies as of 03/20/2019   No Known Allergies     Medication List    STOP taking these medications   amLODipine 5 MG tablet Commonly known as: NORVASC   losartan 50 MG tablet Commonly known as: COZAAR     TAKE these  medications   albuterol 108 (90 Base) MCG/ACT inhaler Commonly known as: VENTOLIN HFA Inhale 2 puffs into the lungs every 6 (six) hours as needed for wheezing or shortness of breath.   apixaban 2.5 MG Tabs tablet Commonly known as: Eliquis Take 1 tablet (2.5 mg total) by mouth 2 (two) times daily.   aspirin 81 MG EC tablet Take 1 tablet (81 mg total) by mouth daily.   diclofenac sodium 1 % Gel Commonly known as: VOLTAREN Apply 4 g topically 4 (four) times daily. What changed:   when to take this  reasons to take this   metoprolol tartrate 25 MG tablet Commonly known as: LOPRESSOR Take 1 tablet (25 mg total) by mouth 2 (two) times daily. What changed:   medication strength  how much to take   multivitamin with minerals Tabs tablet Take 1 tablet by mouth daily.   simvastatin 40 MG tablet Commonly known as: ZOCOR Take 1 tablet (40 mg total) by mouth daily.   Xtandi 40 MG capsule Generic drug: enzalutamide Take 160 mg by mouth daily.      Follow-up Information    [facility]. Schedule an appointment as soon as possible for a visit in 4 week(s).   Specialty: Neurology Contact information: [address] [place] [phone]       [name], MD Follow up in 1 week(s).   Specialty: Family Medicine Contact information: [place] [address] [phone]        Lorretta Harp, MD Follow up.   Specialties: Cardiology, Radiology Why: You have visit scheduled for 04/08/2019 at 2:15pm with Dr. Gwenlyn Found. If this date/time does not work for you, please call our office to reschedule. Contact information: [place]  [place] [address] [phone]            Time coordinating discharge: 25 min  Signed:  Geradine Girt DO  [facility] 03/20/2019, 3:33 PM

## 2019-03-23 ENCOUNTER — Telehealth: Payer: Self-pay | Admitting: *Deleted

## 2019-03-23 NOTE — Telephone Encounter (Signed)
Transition Care Management Follow-up Telephone Call   Date discharged? 11.06.2020   How have you been since you were released from the hospital?" I've been doing fine"    Do you understand why you were in the hospital? yes   Do you understand the discharge instructions? no   Where were you discharged to?  Home    Items Reviewed:  Medications reviewed: yes  Allergies reviewed: yes  Dietary changes reviewed: yes  Referrals reviewed: yes   Functional Questionnaire:   Activities of Daily Living (ADLs):   He states they are independent in the following: ambulation, bathing and hygiene, feeding, continence, grooming, toileting and dressing States they require assistance with the following: N/A    Any transportation issues/concerns?: no   Any patient concerns? no   Confirmed importance and date/time of follow-up visits scheduled yes   Provider Appointment booked with: Dr. Elease Hashimoto for 03/27/2019 at 3:00PM   Confirmed with patient if condition begins to worsen call PCP or go to the ER.  Patient was given the office number and encouraged to call back with question or concerns.  : yes

## 2019-03-27 ENCOUNTER — Encounter: Payer: Self-pay | Admitting: Family Medicine

## 2019-03-27 ENCOUNTER — Ambulatory Visit (INDEPENDENT_AMBULATORY_CARE_PROVIDER_SITE_OTHER): Payer: Medicare HMO | Admitting: Family Medicine

## 2019-03-27 ENCOUNTER — Other Ambulatory Visit: Payer: Self-pay

## 2019-03-27 VITALS — BP 118/80 | HR 89 | Temp 96.8°F | Resp 16 | Ht 71.0 in | Wt 201.7 lb

## 2019-03-27 DIAGNOSIS — I10 Essential (primary) hypertension: Secondary | ICD-10-CM | POA: Diagnosis not present

## 2019-03-27 DIAGNOSIS — I480 Paroxysmal atrial fibrillation: Secondary | ICD-10-CM

## 2019-03-27 DIAGNOSIS — Z8673 Personal history of transient ischemic attack (TIA), and cerebral infarction without residual deficits: Secondary | ICD-10-CM | POA: Diagnosis not present

## 2019-03-27 DIAGNOSIS — E7800 Pure hypercholesterolemia, unspecified: Secondary | ICD-10-CM | POA: Diagnosis not present

## 2019-03-27 DIAGNOSIS — E041 Nontoxic single thyroid nodule: Secondary | ICD-10-CM | POA: Diagnosis not present

## 2019-03-27 DIAGNOSIS — F17200 Nicotine dependence, unspecified, uncomplicated: Secondary | ICD-10-CM | POA: Diagnosis not present

## 2019-03-27 NOTE — Patient Instructions (Signed)
Thyroid Nodule  A thyroid nodule is an isolated growth of thyroid cells that forms a lump in your thyroid gland. The thyroid gland is a butterfly-shaped gland. It is found in the lower front of your neck. This gland sends chemical messengers (hormones) through your blood to all parts of your body. These hormones are important in regulating your body temperature and helping your body to use energy. Thyroid nodules are common. Most are not cancerous (benign). You may have one nodule or several nodules. Different types of thyroid nodules include nodules that:  Grow and fill with fluid (thyroid cysts).  Produce too much thyroid hormone (hot nodules or hyperthyroid).  Produce no thyroid hormone (cold nodules or hypothyroid).  Form from cancer cells (thyroid cancers). What are the causes? In most cases, the cause of this condition is not known. What increases the risk? The following factors may make you more likely to develop this condition.  Age. Thyroid nodules become more common in people who are older than 83 years of age.  Gender. ? Benign thyroid nodules are more common in women. ? Cancerous (malignant) thyroid nodules are more common in men.  A family history that includes: ? Thyroid nodules. ? Pheochromocytoma. ? Thyroid carcinoma. ? Hyperparathyroidism.  Certain kinds of thyroid diseases, such as Hashimoto's thyroiditis.  Lack of iodine in your diet.  A history of head and neck radiation, such as from previous cancer treatment. What are the signs or symptoms? In many cases, there are no symptoms. If you have symptoms, they may include:  A lump in your lower neck.  Feeling a lump or tickle in your throat.  Pain in your neck, jaw, or ear.  Having trouble swallowing. Hot nodules may cause symptoms that include:  Weight loss.  Warm, flushed skin.  Feeling hot.  Feeling nervous.  A racing heartbeat. Cold nodules may cause symptoms that include:  Weight gain.   Dry skin.  Brittle hair. This may also occur with hair loss.  Feeling cold.  Fatigue. Thyroid cancer nodules may cause symptoms that include:  Hard nodules that feel stuck to the thyroid gland.  Hoarseness.  Lumps in the glands near your thyroid (lymph nodes). How is this diagnosed? A thyroid nodule may be felt by your health care provider during a physical exam. This condition may also be diagnosed based on your symptoms. You may also have tests, including:  An ultrasound. This may be done to confirm the diagnosis.  A biopsy. This involves taking a sample from the nodule and looking at it under a microscope.  Blood tests to make sure that your thyroid is working properly.  A thyroid scan. This test uses a radioactive tracer injected into a vein to create an image of the thyroid gland on a computer screen.  Imaging tests such as MRI or CT scan. These may be done if: ? Your nodule is large. ? Your nodule is blocking your airway. ? Cancer is suspected. How is this treated? Treatment depends on the cause and size of your nodule or nodules. If the nodule is benign, treatment may not be necessary. Your health care provider may monitor the nodule to see if it goes away without treatment. If the nodule continues to grow, is cancerous, or does not go away, treatment may be needed. Treatment may include:  Having a cystic nodule drained with a needle.  Ablation therapy. In this treatment, alcohol is injected into the area of the nodule to destroy the cells. Ablation with heat (  thermal ablation) may also be used.  Radioactive iodine. In this treatment, radioactive iodine is given as a pill or liquid that you drink. This substance causes the thyroid nodule to shrink.  Surgery to remove the nodule. Part or all of your thyroid gland may need to be removed as well.  Medicines. Follow these instructions at home:  Pay attention to any changes in your nodule.  Take over-the-counter and  prescription medicines only as told by your health care provider.  Keep all follow-up visits as told by your health care provider. This is important. Contact a health care provider if:  Your voice changes.  You have trouble swallowing.  You have pain in your neck, ear, or jaw that is getting worse.  Your nodule gets bigger.  Your nodule starts to make it harder for you to breathe.  Your muscles look like they are shrinking (muscle wasting). Get help right away if:  You have chest pain.  There is a loss of consciousness.  You have a sudden fever.  You feel confused.  You are seeing or hearing things that other people do not see or hear (having hallucinations).  You feel very weak.  You have mood swings.  You feel very restless.  You feel suddenly nauseous or throw up.  You suddenly have diarrhea. Summary  A thyroid nodule is an isolated growth of thyroid cells that forms a lump in your thyroid gland.  Thyroid nodules are common. Most are not cancerous (benign). You may have one nodule or several nodules.  Treatment depends on the cause and size of your nodule or nodules. If the nodule is benign, treatment may not be necessary.  Your health care provider may monitor the nodule to see if it goes away without treatment. If the nodule continues to grow, is cancerous, or does not go away, treatment may be needed. This information is not intended to replace advice given to you by your health care provider. Make sure you discuss any questions you have with your health care provider. Document Released: 03/23/2004 Document Revised: 12/13/2017 Document Reviewed: 12/16/2017 Elsevier Patient Education  2020 [employer].  Monitor blood pressure and be in touch if consistently > 140/90.

## 2019-03-27 NOTE — Progress Notes (Signed)
Subjective:     Patient ID: [name], male   DOB: 16-Feb-1933, 85 y.o.   MRN: [id number]  HPI   [name] is seen for hospital follow-up.  He had recent TIA.  He was admitted on 11/4 and discharged on the sixth..  His chronic problems including history of hyperlipidemia, hypertension, chronic kidney disease, A. fib, prostate cancer.  He is on Eliquis at baseline.  He presented to the ED with slurred speech and right arm weakness which started 11 AM day of admission.  His wife called EMS.  They arrived around 12:10 PM and by that point his symptoms had resolved.  No recent headache or seizure.  No lower extremity weakness.  Symptoms fully resolved.  MRI showed no clear evidence for CVA.  Echocardiogram ejection fraction 60 to 65%.  Hemoglobin A1c 6.1%.  LDL cholesterol 100.  Neurology was consulted and recommended adding 81 mg aspirin to his Eliquis.  For some reason, his losartan and amlodipine were discontinued.  It was not clear he was having hypotension episodes.  He has not been monitoring blood pressure since discharge.  His discharge blood pressure was apparently 148/92 but is improved here today in the office.  Generally feels well and back to baseline.  He unfortunately still smoking about a pack cigarettes per day.  There was incidental note of 2.2 cm left thyroid nodule from his CT angiogram of the neck.  No prior history of known thyroid nodules.  Past Medical History:  Diagnosis Date  . Disturbances of sulphur-bearing amino-acid metabolism   . Malignant neoplasm of prostate ([place])   . Obstructive sleep apnea (adult) (pediatric) 2004   surgery to coorrect  . Osteoarthrosis, unspecified whether generalized or localized, unspecified site   . Other and unspecified hyperlipidemia   . Other specified cardiac dysrhythmias(427.89)   . 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 Paroxysmal atrial fibrillation ([facility])   . Unspecified cerebral artery occlusion with cerebral  infarction   . Unspecified disorder resulting from impaired renal function   . Unspecified essential hypertension   . Wears glasses   . Wears partial dentures    upper partial   Past Surgical History:  Procedure Laterality Date  . APPLICATION OF A-CELL OF EXTREMITY Right 08/12/2013   Procedure: APPLICATION OF A-CELL OF EXTREMITY;  Surgeon: Theodoro Kos, DO;  Location: [place] [facility];  Service: Plastics;  Laterality: Right;  . CHOLECYSTECTOMY  1987  . COLONOSCOPY    . ent surgery  2004   correct snoring  . INGUINAL HERNIA REPAIR     left  . INSERT / REPLACE / REMOVE PACEMAKER  04/14/2013   dual chamber   / Dr Sallyanne Kuster  . MINOR IRRIGATION AND DEBRIDEMENT OF WOUND Right 03/04/2013   Procedure: MINOR IRRIGATION AND DEBRIDEMENT OF WOUND;  Surgeon: Theodoro Kos, DO;  Location: [facility];  Service: Plastics;  Laterality: Right;  . ORCHIECTOMY  2002  . PERMANENT PACEMAKER INSERTION N/A 04/14/2013   Procedure: PERMANENT PACEMAKER INSERTION;  Surgeon: [name], MD;  Location: [facility];  Service: Cardiovascular;  Laterality: N/A;  . SKIN SPLIT GRAFT Right 08/12/2013   Procedure: SKIN GRAFT SPLIT THICKNESS WITH PLACEMENT OF VAC TO RIGHT LOWER LEG/PLACEMENT OF ACELL TO RIGHT UPPER THIGH AREA (HARVEST SITE);  Surgeon: Theodoro Kos, DO;  Location: [place];  Service: Plastics;  Laterality: Right;    reports that he has been smoking cigarettes. He has a 15.00 pack-year smoking history. He has  never used smokeless tobacco. He reports that he does not drink alcohol or use drugs. family history includes Prostate cancer in his father. No Known Allergies   Review of Systems  Constitutional: Negative for chills, fatigue and unexpected weight change.  Eyes: Negative for visual disturbance.  Respiratory: Negative for cough, chest tightness, shortness of breath and wheezing.   Cardiovascular: Negative for chest pain, palpitations and leg swelling.   Gastrointestinal: Negative for abdominal pain.  Neurological: Negative for dizziness, syncope, weakness, light-headedness and headaches.       Objective:   Physical Exam Constitutional:      Appearance: Normal appearance.  Neck:     Musculoskeletal: Neck supple.     Comments: He does have some fullness in palpating the left thyroid region. Cardiovascular:     Rate and Rhythm: Normal rate.  Pulmonary:     Effort: Pulmonary effort is normal.     Breath sounds: Normal breath sounds.  Musculoskeletal:     Right lower leg: No edema.     Left lower leg: No edema.  Neurological:     Mental Status: He is alert.        Assessment:     #1 recent TIA symptoms with slurred speech and right upper extremity weakness which resolved in about 1 hour  #2 history of chronic atrial fibrillation on Eliquis  #3 history of severe peripheral vascular disease  #4 hypertension currently stable on metoprolol  #5 ongoing nicotine use  #6 incidental note of 2.2 cm left thyroid nodule on CT angiogram of neck  #7 dyslipidemia with recent LDL cholesterol of 100.  Patient on statin    Plan:     -Flu vaccine already given -Monitor blood pressure closely at home and be in touch if consistently greater than 140/90 -Set up ultrasound neck to further evaluate left thyroid nodule.  May need fine-needle biopsy to further assess -Strongly encouraged to stop smoking.  Current motivation low. -Routine follow-up in 3 months and sooner as needed  [name] MD [facility]

## 2019-03-28 DIAGNOSIS — Z8673 Personal history of transient ischemic attack (TIA), and cerebral infarction without residual deficits: Secondary | ICD-10-CM | POA: Insufficient documentation

## 2019-04-08 ENCOUNTER — Ambulatory Visit: Payer: Medicare HMO | Admitting: Cardiovascular Disease

## 2019-04-20 ENCOUNTER — Ambulatory Visit
Admission: RE | Admit: 2019-04-20 | Discharge: 2019-04-20 | Disposition: A | Discharge status: Home / Self Care | Payer: Medicare HMO | Source: Ambulatory Visit | Attending: Family Medicine | Admitting: Family Medicine

## 2019-04-20 DIAGNOSIS — E041 Nontoxic single thyroid nodule: Secondary | ICD-10-CM | POA: Diagnosis not present

## 2019-04-22 ENCOUNTER — Ambulatory Visit (INDEPENDENT_AMBULATORY_CARE_PROVIDER_SITE_OTHER): Payer: Medicare HMO | Admitting: *Deleted

## 2019-04-22 DIAGNOSIS — Z950 Presence of cardiac pacemaker: Secondary | ICD-10-CM

## 2019-04-24 LAB — CUP PACEART REMOTE DEVICE CHECK
Battery Remaining Longevity: 43 mo
Battery Voltage: 2.97 V
Brady Statistic AP VP Percent: 2.6 %
Brady Statistic AP VS Percent: 95.78 %
Brady Statistic AS VP Percent: 0.01 %
Brady Statistic AS VS Percent: 1.61 %
Brady Statistic RA Percent Paced: 97.93 %
Brady Statistic RV Percent Paced: 2.62 %
Date Time Interrogation Session: 20201211115134
Implantable Lead Implant Date: 20141202
Implantable Lead Implant Date: 20141202
Implantable Lead Location: 753859
Implantable Lead Location: 753860
Implantable Lead Model: 5076
Implantable Lead Model: 5076
Implantable Pulse Generator Implant Date: 20141202
Lead Channel Impedance Value: 342 Ohm
Lead Channel Impedance Value: 361 Ohm
Lead Channel Impedance Value: 399 Ohm
Lead Channel Impedance Value: 475 Ohm
Lead Channel Pacing Threshold Amplitude: 0.625 V
Lead Channel Pacing Threshold Amplitude: 1.25 V
Lead Channel Pacing Threshold Pulse Width: 0.4 ms
Lead Channel Pacing Threshold Pulse Width: 0.4 ms
Lead Channel Sensing Intrinsic Amplitude: 1.125 mV
Lead Channel Sensing Intrinsic Amplitude: 1.125 mV
Lead Channel Sensing Intrinsic Amplitude: 18.5 mV
Lead Channel Sensing Intrinsic Amplitude: 18.5 mV
Lead Channel Setting Pacing Amplitude: 2 V
Lead Channel Setting Pacing Amplitude: 2.5 V
Lead Channel Setting Pacing Pulse Width: 0.4 ms
Lead Channel Setting Sensing Sensitivity: 0.9 mV

## 2019-04-27 ENCOUNTER — Other Ambulatory Visit: Payer: Self-pay

## 2019-04-27 MED ORDER — APIXABAN 2.5 MG PO TABS: 2.5000 mg | ORAL_TABLET | Freq: Two times a day (BID) | ORAL | 0 refills | Status: DC

## 2019-05-12 ENCOUNTER — Ambulatory Visit (INDEPENDENT_AMBULATORY_CARE_PROVIDER_SITE_OTHER): Payer: Medicare HMO | Admitting: Adult Health

## 2019-05-12 ENCOUNTER — Encounter: Payer: Self-pay | Admitting: Adult Health

## 2019-05-12 ENCOUNTER — Inpatient Hospital Stay: Payer: Medicare HMO | Admitting: Adult Health

## 2019-05-12 ENCOUNTER — Other Ambulatory Visit: Payer: Self-pay

## 2019-05-12 VITALS — BP 138/88 | HR 79 | Temp 97.8°F | Ht 71.0 in | Wt 208.0 lb

## 2019-05-12 DIAGNOSIS — Z720 Tobacco use: Secondary | ICD-10-CM

## 2019-05-12 DIAGNOSIS — I10 Essential (primary) hypertension: Secondary | ICD-10-CM

## 2019-05-12 DIAGNOSIS — R29818 Other symptoms and signs involving the nervous system: Secondary | ICD-10-CM

## 2019-05-12 DIAGNOSIS — E785 Hyperlipidemia, unspecified: Secondary | ICD-10-CM

## 2019-05-12 DIAGNOSIS — G459 Transient cerebral ischemic attack, unspecified: Secondary | ICD-10-CM

## 2019-05-12 DIAGNOSIS — I480 Paroxysmal atrial fibrillation: Secondary | ICD-10-CM | POA: Diagnosis not present

## 2019-05-12 DIAGNOSIS — I6523 Occlusion and stenosis of bilateral carotid arteries: Secondary | ICD-10-CM

## 2019-05-12 NOTE — Progress Notes (Signed)
[facility] [address] [place]. [place] [phone]       HOSPITAL FOLLOW UP NOTE  Mr. NAGUAN MAZON Date of Birth:  February 02, 1933 Medical Record Number:  [id number]   Reason for Referral:  hospital stroke follow up    CHIEF COMPLAINT:  Chief Complaint  Patient presents with   Follow-up    RM9. Alone. No questions nor concerns.     HPI: Izreal Pu Mitchellis being seen today for in office hospital follow-up regarding left brain TIA likely secondary to large vessel source due to bilateral ICA siphon stenosis left greater than right on 03/18/2019.  History obtained from patient and chart review. Reviewed all radiology images and labs personally.  Mr. AUBIE PRESHA is a 85 y.o. male with history of atrial fibrillation on Eliquis last dose taken at 9 AM 11/4, tachybradycardia syndrome status post pacemaker, hypertension, hyperlipidemia, diabetes, prostate cancer, who presented on 03/18/2019 with sudden onset of inability to talk and right-sided weakness x 1 hr.  Evaluated by Dr. 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  He did not receive IV tPA due to resolution of symptoms.  CT head negative for acute abnormality with evidence of advanced small vessel disease, atrophy and multiple old and questionable age-related bilateral basal ganglia and thalamic lacunes.  CTA head showed significant arthrosclerosis with moderate/severe paraclinoid left ICA, moderate paraclinoid right ICA, focal stenosis bilateral M1, and fetal origin left PCA with significant arthrosclerosis PCom.  CTA neck significant right VA stenosis.  MRI no acute infarct.  2D echo showed an EF of 60 to 65% without cardiac source of embolus identified.  As per Dr. Sallyanne Kuster, pacemaker interrogation showed very low AF burden.  Previously on Eliquis and recommended continuation of Eliquis in addition to  aspirin 81 mg daily.  Due to intracranial ICA stenosis, recommended blood pressure goal 130-150 with avoidance of low BP and adding aspirin 81 mg daily on top of Eliquis.  History of HTN stable.  LDL 100 and recommended switching statin from simvastatin to atorvastatin 40 mg daily.  A1c 6.1 without prior history of DM.  Current tobacco use with smoking cessation counseling provided.  Other stroke risk factors include advanced age and tachybradycardia status post pacer placement in 2014 followed by cardiology and prior stroke on imaging.  Other active problems include CKD stage III, history of prostate cancer and 2.2 cm left thyroid nodule and recommended ultrasound follow-up.  Initial visit 05/12/2019: Mr. Renfroe is a 84 year old male who is being seen today for hospital follow-up.  He has been doing well since discharge without any reoccurring or new stroke/TIA symptoms.  He continues to live independently with his wife maintaining all ADLs and IADLs without difficulty including driving.  Continues on Eliquis 2.5 mg twice daily and aspirin 81 mg daily without bleeding or bruising.  He is currently on simvastatin 40 mg daily and denies myalgias.  He denies being placed on atorvastatin at hospital discharge as recommended by neurology team.  He has not had any repeat levels at this time.  Blood pressure today satisfactory 138/88.  He does endorse sleeping for long period of time throughout the night since his TIA previously going to bed between 11:30 PM-12AM and will awaken around 6-7AM but now has been sleeping until 8-9AM and has difficulty getting out of bed.  He does endorse sleeping well throughout the night without any concerns  of nocturia or insomnia.  He does admit to napping frequently throughout the day.  He does endorse prior history of sleep evaluation and underwent "palate surgery" but he is unable to verify exact year or reason for procedure.  He continues to smoke tobacco approximately 3/4 pack/day  and has attempted to decrease but is concerned regarding increasing fluid intake at that time.  No further concerns at this time.     ROS:   14 system review of systems performed and negative with exception of wheezing, urination problems, joint pain, joint swelling, runny nose, memory loss, weakness, too much sleep and decreased energy  PMH:  Past Medical History:  Diagnosis Date   Disturbances of sulphur-bearing amino-acid metabolism    Malignant neoplasm of prostate ([place])    Obstructive sleep apnea (adult) (pediatric) 2004   surgery to coorrect   Osteoarthrosis, unspecified whether generalized or localized, unspecified site    Other and unspecified hyperlipidemia    Other specified cardiac dysrhythmias(427.89)    Pacemaker 04/14/2013   DUAL CHAMBER PACEMAKER   Paroxysmal atrial fibrillation ([place])    Unspecified cerebral artery occlusion with cerebral infarction    Unspecified disorder resulting from impaired renal function    Unspecified essential hypertension    Wears glasses    Wears partial dentures    upper partial    PSH:  Past Surgical History:  Procedure Laterality Date   APPLICATION OF A-CELL OF EXTREMITY Right 08/12/2013   Procedure: APPLICATION OF A-CELL OF EXTREMITY;  Surgeon: Theodoro Kos, DO;  Location: [facility];  Service: Plastics;  Laterality: Right;   CHOLECYSTECTOMY  1987   COLONOSCOPY     ent surgery  2004   correct snoring   INGUINAL HERNIA REPAIR     left   INSERT / REPLACE / REMOVE PACEMAKER  04/14/2013   dual chamber   / Dr Sallyanne Kuster   MINOR IRRIGATION AND DEBRIDEMENT OF WOUND Right 03/04/2013   Procedure: MINOR IRRIGATION AND DEBRIDEMENT OF WOUND;  Surgeon: Theodoro Kos, DO;  Location: [facility];  Service: Plastics;  Laterality: Right;   ORCHIECTOMY  2002   PERMANENT PACEMAKER INSERTION N/A 04/14/2013   Procedure: PERMANENT PACEMAKER INSERTION;  Surgeon: [name], MD;  Location: [facility]  [facility];  Service: Cardiovascular;  Laterality: N/A;   SKIN SPLIT GRAFT Right 08/12/2013   Procedure: SKIN GRAFT SPLIT THICKNESS WITH PLACEMENT OF VAC TO RIGHT LOWER LEG/PLACEMENT OF ACELL TO RIGHT UPPER THIGH AREA (HARVEST SITE);  Surgeon: Theodoro Kos, DO;  Location: [facility];  Service: Plastics;  Laterality: Right;    Social History:  Social History   Socioeconomic History   Marital status: Married    Spouse name: Consulting civil engineer   Number of children: Not on file   Years of education: Not on file   Highest education level: Not on file  Occupational History   Occupation: Retired   Occupation: RETIRED    Employer: RETIRED  Tobacco Use   Smoking status: Current Some Day Smoker    Packs/day: 0.50    Years: 30.00    Pack years: 15.00    Types: Cigarettes   Smokeless tobacco: Never Used   Tobacco comment:  pt stated he smokes 1 pack every 2 wks  Substance and Sexual Activity   Alcohol use: No   Drug use: No   Sexual activity: Not on file    Comment: started smoking 2-3 cig daily  Other Topics Concern   Not on file  Social History  Narrative   Not on file   Social Determinants of Health   Financial Resource Strain:    Difficulty of Paying Living Expenses: Not on file  Food Insecurity:    Worried About [place] in the Last Year: Not on file   Ran Out of Food in the Last Year: Not on file  Transportation Needs:    Lack of Transportation (Medical): Not on file   Lack of Transportation (Non-Medical): Not on file  Physical Activity:    Days of Exercise per Week: Not on file   Minutes of Exercise per Session: Not on file  Stress:    Feeling of Stress : Not on file  Social Connections:    Frequency of Communication with Friends and Family: Not on file   Frequency of Social Gatherings with Friends and Family: Not on file   Attends Religious Services: Not on file   Active Member of [place] or Organizations: Not on file   Attends  Archivist Meetings: Not on file   Marital Status: Not on file  Intimate Partner Violence:    Fear of Current or Ex-Partner: Not on file   Emotionally Abused: Not on file   Physically Abused: Not on file   Sexually Abused: Not on file    Family History:  Family History  Problem Relation Age of [place]   Prostate cancer Father     Medications:   Current Outpatient Medications on File Prior to Visit  Medication Sig Dispense Refill   albuterol (VENTOLIN HFA) 108 (90 Base) MCG/ACT inhaler Inhale 2 puffs into the lungs every 6 (six) hours as needed for wheezing or shortness of breath. 18 g 3   apixaban (ELIQUIS) 2.5 MG TABS tablet Take 1 tablet (2.5 mg total) by mouth 2 (two) times daily. 60 tablet 0   aspirin EC 81 MG EC tablet Take 1 tablet (81 mg total) by mouth daily. 30 tablet 0   calcium-vitamin D (OSCAL WITH D) 500-200 MG-UNIT tablet Take 1 tablet by mouth.     diclofenac sodium (VOLTAREN) 1 % GEL Apply 4 g topically 4 (four) times daily. (Patient taking differently: Apply 4 g topically 4 (four) times daily as needed (knee pain). ) 100 g 2   metoprolol tartrate (LOPRESSOR) 25 MG tablet Take 1 tablet (25 mg total) by mouth 2 (two) times daily. 60 tablet 0   Multiple Vitamin (MULTIVITAMIN WITH MINERALS) TABS tablet Take 1 tablet by mouth daily.     simvastatin (ZOCOR) 40 MG tablet Take 1 tablet (40 mg total) by mouth daily. 90 tablet 1   XTANDI 40 MG capsule Take 160 mg by mouth daily.      No current facility-administered medications on file prior to visit.    Allergies:  No Known Allergies   Physical Exam  Vitals:   05/12/19 0919  BP: 138/88  Pulse: 79  Temp: 97.8 F (36.6 C)  Weight: 208 lb (94.3 kg)  Height: 5\' 11"  (1.803 m)   Body mass index is 29.01 kg/m. No exam data present  Depression screen [facility] 2/9 05/12/2019  Decreased Interest 0  Down, Depressed, Hopeless 0  PHQ - 2 Score 0     General: well developed, well nourished,  pleasant  elderly African-American male, seated, in no evident distress Head: head normocephalic and atraumatic.   Neck: supple with no carotid or supraclavicular bruits Cardiovascular: irregular rate and rhythm, no murmurs Musculoskeletal: no deformity Skin:  no rash/petichiae Vascular:  Normal pulses all extremities  Neurologic Exam Mental Status: Awake and fully alert.   Normal speech and language.  Oriented to place and time. Recent and remote memory intact. Attention span, concentration and fund of knowledge appropriate. Mood and affect appropriate.  Cranial Nerves: Fundoscopic exam reveals sharp disc margins. Pupils equal, briskly reactive to light. Extraocular movements full without nystagmus. Visual fields full to confrontation. Hearing intact. Facial sensation intact. Face, tongue, palate moves normally and symmetrically.  Motor: Normal bulk and tone. Normal strength in all tested extremity muscles except mild bilateral hip flexor weakness. Sensory.: intact to touch , pinprick , position and vibratory sensation.  Coordination: Rapid alternating movements normal in all extremities. Finger-to-nose and heel-to-shin performed accurately bilaterally. Gait and Station: Arises from chair without difficulty. Stance is normal. Gait demonstrates  short shuffled steps but is able to ambulate without assistive device or evidence of imbalance Reflexes: 1+ and symmetric. Toes downgoing.     NIHSS  0 Modified Rankin  0 CHA2DS2-VASc 6 HAS-BLED 3   Diagnostic Data (Labs, Imaging, Testing)  CT HEAD WO CONTRAST 03/18/2019 IMPRESSION: 1. No acute intracranial hemorrhage or demarcated cortical infarction 2. Advanced chronic small vessel ischemic disease. Numerous lacunar infarcts within the bilateral basal ganglia and thalami, some of which appear chronic and others which are age indeterminate. Consider brain MRI for further evaluation.  CT ANGIO HEAD W OR WO CONTRAST CT ANGIO NECK W OR WO  CONTRAST 03/18/2019 IMPRESSION: CTA neck:  1. Common and internal carotid arteries patent within the neck without measurable stenosis. 2. Significant dominant right vertebral artery without flow-limiting stenosis. Mixed plaque results in mild ostial stenosis. 3. A non dominant and developmentally diminutive left vertebral artery demonstrates significant atherosclerotic irregularity but appears patent throughout the neck. 4. 2.2 cm left thyroid lobe nodule. Dedicated thyroid ultrasound recommended for further evaluation.  CTA head:  1. Significant intracranial atherosclerotic disease with multifocal stenoses, most notably as follows. 2. Moderate/severe stenosis of the paraclinoid left ICA. 3. Moderate stenosis of the paraclinoid right ICA. 4. Moderate focal stenoses within the bilateral M1 middle cerebral arteries. 5. Predominantly fetal origin of the left PCA with significant atherosclerotic irregularity of the left posterior communicating artery. 6. Additional mild and moderate stenoses as detailed.  MR BRAIN WO CONTRAST 03/19/2019 IMPRESSION: 1. Punctate acute cortical infarct versus artifact within the left superior frontal gyrus. 2. Moderate chronic small vessel ischemic disease with multiple chronic lacunar infarcts as described. 3. Incidentally noted 7 mm meningioma overlying the right parietal lobe.  ECHOCARDIOGRAM 03/18/2019 IMPRESSIONS  1. Left ventricular ejection fraction, by visual estimation, is 60 to 65%. The left ventricle has normal function. There is moderately increased left ventricular hypertrophy of the basal septum.  2. There is akinesis of the basal inferior wall.  3. Left ventricular diastolic parameters are consistent with Grade I diastolic dysfunction (impaired relaxation).  4. The right ventricular size is not well visualized. No increase in right ventricular wall thickness.  5. Left atrial size was normal.  6. Right atrial size was normal.  7.  The mitral valve is normal in structure. Mild mitral valve regurgitation. No evidence of mitral stenosis.  8. The tricuspid valve is normal in structure. Tricuspid valve regurgitation is not demonstrated.  9. The aortic valve is normal in structure. Aortic valve regurgitation is mild. Mild aortic valve sclerosis without stenosis. 10. The pulmonic valve was normal in structure. Pulmonic valve regurgitation is trivial. 11. The inferior vena cava is normal in size with greater than 50% respiratory variability, suggesting right atrial pressure  of 3 mmHg. 12. TR signal is inadequate for assessing pulmonary artery systolic pressure.    ASSESSMENT: [name] is a 85 y.o. year old male presented with sudden onset of inability to talk and right-sided weakness x1 hour on 03/18/2019 with stroke work-up likely left brain TIA likely secondary to large vessel source due to bilateral ICA siphon stenosis left more than right. Vascular risk factors include atrial fibrillation on Eliquis, intracranial ICA stenosis, HTN, HLD, current tobacco use, advanced age and tachybradycardia status post pacer placement in 2014.  He has been stable from a neurological standpoint without new or recurring stroke/TIA symptoms.  He does endorse increased daytime fatigue and decreased energy    PLAN:  1. Left brain TIA: Continue aspirin 81 mg daily and Eliquis (apixaban) daily  and simvastatin for secondary stroke prevention. Maintain strict control of hypertension with blood pressure goal between 1 30-1 50, diabetes with hemoglobin A1c goal below 6.5% and cholesterol with LDL cholesterol (bad cholesterol) goal below 70 mg/dL.  I also advised the patient to eat a healthy diet with plenty of whole grains, cereals, fruits and vegetables, exercise regularly with at least 30 minutes of continuous activity daily and maintain ideal body weight. 2. Intracranial ICA stenosis: Likely etiology of recent TIA.  Discussion regarding blood  pressure with blood pressure goal 1 30-1 50 and continuation of aspirin 81 mg daily.  Recommend repeating imaging 6 months post TIA.  Highly encourage smoking cessation due to increased risk of worsening stenosis 3. Atrial fibrillation: Continuation of Eliquis and ongoing follow-up with cardiology 4. HTN: Advised to continue current treatment regimen.  Today's BP stable.  Advised to continue to monitor at home along with continued follow-up with PCP for management 5. HLD: Advised to continue current treatment regimen at this time but will repeat lipid panel at today's visit and if LDL>70, it will be recommended to switch to atorvastatin 6. Tobacco use: Discussion regarding importance of complete tobacco cessation due to extensive medical history and intracranial stenosis which will likely worsen with ongoing tobacco use.  He verbalized understanding and is agreeable to quitting 7. Suspected sleep apnea: High risk of sleep apnea with history of atrial fibrillation and recent TIA.  Discussion regarding increased risk of worsening cardiovascular conditions and recurrent stroke with untreated sleep apnea.  Referral placed to [facility] for sleep evaluation    Follow up in 4 months or call earlier if needed .  Greater than 50% of time during this 45 minute visit was spent on counseling, explanation of diagnosis of TIA, reviewing risk factor management of intracranial ICA stenosis, atrial fibrillation, HTN, HLD and tobacco use, planning of further management along with potential future management, and discussion with patient and family answering all questions.    [name], AGNP-BC  [facility] [address] [place],  [address]  Phone [phone] Fax [phone] Note: This document was prepared with digital dictation and possible smart phrase technology. Any transcriptional errors that result from this process are unintentional.

## 2019-05-12 NOTE — Patient Instructions (Signed)
Continue aspirin 81 mg daily and Eliquis (apixaban) daily  and simvastatin for secondary stroke prevention  We will check your cholesterol levels today and if your LDL or bad cholesterol remains above 70, it would be recommended to switch statin use to atorvastatin   Referral will be placed to be evaluated possible sleep apnea  Highly encourage smoking cessation  Continue to follow up with PCP regarding cholesterol and blood pressure management   Continue to monitor blood pressure at home maintain blood pressure goal between 130-150  Maintain strict control of hypertension with blood pressure goal between 130-150, diabetes with hemoglobin A1c goal below 6.5% and cholesterol with LDL cholesterol (bad cholesterol) goal below 70 mg/dL. I also advised the patient to eat a healthy diet with plenty of whole grains, cereals, fruits and vegetables, exercise regularly and maintain ideal body weight.  Followup in the future with me in 4 months or call earlier if needed       Thank you for coming to see [place] at [facility]. I hope we have been able to provide you high quality care today.  You may receive a patient satisfaction survey over the next few weeks. We would appreciate your feedback and comments so that we may continue to improve ourselves and the health of our patients.

## 2019-05-13 ENCOUNTER — Other Ambulatory Visit: Payer: Self-pay | Admitting: Adult Health

## 2019-05-13 ENCOUNTER — Telehealth: Payer: Self-pay | Admitting: *Deleted

## 2019-05-13 LAB — LIPID PANEL
Chol/HDL Ratio: 4.3 ratio (ref 0.0–5.0)
Cholesterol, Total: 181 mg/dL (ref 100–199)
HDL: 42 mg/dL (ref >39)
LDL Chol Calc (NIH): 103 mg/dL — ABNORMAL HIGH (ref 0–99)
Triglycerides: 206 mg/dL — ABNORMAL HIGH (ref 0–149)
VLDL Cholesterol Cal: 36 mg/dL (ref 5–40)

## 2019-05-13 MED ORDER — ATORVASTATIN CALCIUM 40 MG PO TABS: 40.0000 mg | ORAL_TABLET | Freq: Every day | ORAL | 4 refills | Status: DC

## 2019-05-13 NOTE — Telephone Encounter (Signed)
Spoke with patient and informed  his LDL or bad cholesterol has continued to be elevated above goal of 70 along with elevated triglycerides. As discussed at yesterday's visit, the NP recommends switching statins to atorvastatin 40 mg daily. She advises he pick up Rx and begin today. Patient verbalized understanding, appreciation.

## 2019-05-19 NOTE — Progress Notes (Signed)
I agree with the above plan 

## 2019-05-25 ENCOUNTER — Telehealth: Payer: Self-pay | Admitting: Cardiovascular Disease

## 2019-05-25 ENCOUNTER — Telehealth: Payer: Self-pay | Admitting: Family Medicine

## 2019-05-25 NOTE — Telephone Encounter (Signed)
Patient has an appointment tomorrow at 2:15pm. Sending as [name]

## 2019-05-25 NOTE — Telephone Encounter (Signed)
Patient wife called and would like to talk to Dr. Elease Hashimoto about patient. She states that patient has been having nose bleed here and there and she thinks it may be related to a med he is taking apixaban (ELIQUIS) 2.5 MG TABS tablet. Please return Ms. Remo Lipps call, thanks.

## 2019-05-25 NOTE — Telephone Encounter (Signed)
Pt c/o medication issue:  1. Name of Medication: apixaban (ELIQUIS) 2.5 MG TABS tablet  2. How are you currently taking this medication (dosage and times per day)? 1 tablet by mouth 2 times daily   3. Are you having a reaction (difficulty breathing--STAT)? No   4. What is your medication issue? Patient's wife is calling requesting they get some assistance with the price of apixaban (ELIQUIS) 2.5 MG TABS tablet. She states it cost $150 pre refill and she was advised by the nurse to reach out in regards to it if needed. Please advise.

## 2019-05-26 ENCOUNTER — Telehealth: Payer: Medicare HMO | Admitting: Family Medicine

## 2019-05-26 NOTE — Telephone Encounter (Signed)
Call placed to the patient's wife. She stated that she would like the eliquis assistance forms mailed to her.   Forms have been mailed.

## 2019-05-27 ENCOUNTER — Telehealth (INDEPENDENT_AMBULATORY_CARE_PROVIDER_SITE_OTHER): Payer: Medicare HMO | Admitting: Family Medicine

## 2019-05-27 ENCOUNTER — Other Ambulatory Visit: Payer: Self-pay

## 2019-05-27 DIAGNOSIS — R040 Epistaxis: Secondary | ICD-10-CM

## 2019-05-27 NOTE — Progress Notes (Signed)
This visit type was conducted due to national recommendations for restrictions regarding the COVID-19 pandemic in an effort to limit this patient's exposure and mitigate transmission in our community.   Virtual Visit via Telephone Note  I connected with [name] on 05/27/19 at  2:30 PM EST by telephone and verified that I am speaking with the correct person using two identifiers.   I discussed the limitations, risks, security and privacy concerns of performing an evaluation and management service by telephone and the availability of in person appointments. I also discussed with the patient that there may be a patient responsible charge related to this service. The patient expressed understanding and agreed to proceed.  Location patient: home Location provider: work or home office Participants present for the call: patient, provider Patient did not have a visit in the prior 7 days to address this/these issue(s).   History of Present Illness: Mr. [name] has chronic problems including atrial fibrillation, obstructive sleep apnea, metastatic prostate cancer, history of TIAs, hypertension, chronic kidney disease, ongoing nicotine use, chronic anticoagulation.  His wife had called with concerns that he is having some nosebleeds for about 2 to 3 weeks.  These are right-sided.  He does take Eliquis as above.  Usually with just a few minutes of pressure and packing his nose with Kleenex this stops.  The bleeds have been relatively mild.  Blood pressures been stable.  No other bleeding complications.  Past Medical History:  Diagnosis Date  . Disturbances of sulphur-bearing amino-acid metabolism   . Malignant neoplasm of prostate ([place])   . Obstructive sleep apnea (adult) (pediatric) 2004   surgery to coorrect  . Osteoarthrosis, unspecified whether generalized or localized, unspecified site   . Other and unspecified hyperlipidemia   . Other specified cardiac dysrhythmias(427.89)   . Pacemaker  04/14/2013   DUAL CHAMBER PACEMAKER  . Paroxysmal atrial fibrillation ([facility])   . Unspecified cerebral artery occlusion with cerebral infarction   . Unspecified disorder resulting from impaired renal function   . Unspecified essential hypertension   . Wears glasses   . Wears partial dentures    upper partial   Past Surgical History:  Procedure Laterality Date  . APPLICATION OF A-CELL OF EXTREMITY Right 08/12/2013   Procedure: APPLICATION OF A-CELL OF EXTREMITY;  Surgeon: Theodoro Kos, DO;  Location: [facility];  Service: Plastics;  Laterality: Right;  . CHOLECYSTECTOMY  1987  . COLONOSCOPY    . ent surgery  2004   correct snoring  . INGUINAL HERNIA REPAIR     left  . INSERT / REPLACE / REMOVE PACEMAKER  04/14/2013   dual chamber   / Dr Sallyanne Kuster  . MINOR IRRIGATION AND DEBRIDEMENT OF WOUND Right 03/04/2013   Procedure: MINOR IRRIGATION AND DEBRIDEMENT OF WOUND;  Surgeon: Theodoro Kos, DO;  Location: [facility];  Service: Plastics;  Laterality: Right;  . ORCHIECTOMY  2002  . PERMANENT PACEMAKER INSERTION N/A 04/14/2013   Procedure: PERMANENT PACEMAKER INSERTION;  Surgeon: [name], MD;  Location: [facility];  Service: Cardiovascular;  Laterality: N/A;  . SKIN SPLIT GRAFT Right 08/12/2013   Procedure: SKIN GRAFT SPLIT THICKNESS WITH PLACEMENT OF VAC TO RIGHT LOWER LEG/PLACEMENT OF ACELL TO RIGHT UPPER THIGH AREA (HARVEST SITE);  Surgeon: Theodoro Kos, DO;  Location: [facility];  Service: Plastics;  Laterality: Right;    reports that he has been smoking cigarettes. He has a 15.00 pack-year smoking history. He has never used smokeless tobacco. He reports that  he does not drink alcohol or use drugs. family history includes Prostate cancer in his father. No Known Allergies    Observations/Objective: Patient sounds cheerful and well on the phone. I do not appreciate any SOB. Speech and thought processing are grossly intact. Patient  reported vitals:  Assessment and Plan: Recurrent epistaxis right nostril past few weeks.  Risk factor of chronic anticoagulation with Eliquis  -Cautioned to avoid blowing the nose -He will continue Eliquis at this time as his bleeds have been relatively mild and he is at high risk of complication with discontinuation of anticoagulation. -If bleeds persist may need office follow-up to see if he is a candidate for cauterization if site of bleed can be localized.  At this point they would like to observe  Follow Up Instructions:  As above   [id number] [id number] [id number] 11-20 [id number] 21-30 I did not refer this patient for an OV in the next 24 hours for this/these issue(s).  I discussed the assessment and treatment plan with the patient. The patient was provided an opportunity to ask questions and all were answered. The patient agreed with the plan and demonstrated an understanding of the instructions.   The patient was advised to call back or seek an in-person evaluation if the symptoms worsen or if the condition fails to improve as anticipated.  I provided 17 minutes of non-face-to-face time during this encounter.   Carolann Littler, MD

## 2019-05-28 ENCOUNTER — Telehealth: Payer: Self-pay

## 2019-05-28 MED ORDER — ATORVASTATIN CALCIUM 40 MG PO TABS: 40.0000 mg | ORAL_TABLET | Freq: Every day | ORAL | 1 refills | Status: DC

## 2019-05-28 NOTE — Telephone Encounter (Addendum)
Atorvastatin Rx sent to [facility] mail delivery, 90 day supply with one refill. Called wife and advised her of new Rx as she requested. She stated they have used [facility] a long time, but needed it to be sent locally at first to be sure he got it in time. [facility] can take up to two weeks to send new med. She verbalized understanding, appreciation.

## 2019-05-28 NOTE — Telephone Encounter (Signed)
1) Medication(s) Requested (by name): atorvastatin (LIPITOR) 40 MG tablet   2) Pharmacy of Choice:  [facility] mail delivery    Patient wife called to inform if medication could be sent to [facility] for mail delivery and would like to know if it can be prescribed as a 90 day supply

## 2019-06-02 ENCOUNTER — Encounter: Payer: Self-pay | Admitting: Neurology

## 2019-06-02 ENCOUNTER — Other Ambulatory Visit: Payer: Self-pay

## 2019-06-02 ENCOUNTER — Ambulatory Visit: Payer: Medicare HMO | Admitting: Neurology

## 2019-06-02 VITALS — BP 149/101 | HR 85 | Temp 97.5°F | Ht 71.0 in | Wt 208.0 lb

## 2019-06-02 DIAGNOSIS — Z720 Tobacco use: Secondary | ICD-10-CM | POA: Diagnosis not present

## 2019-06-02 DIAGNOSIS — I10 Essential (primary) hypertension: Secondary | ICD-10-CM

## 2019-06-02 DIAGNOSIS — I480 Paroxysmal atrial fibrillation: Secondary | ICD-10-CM

## 2019-06-02 DIAGNOSIS — G4719 Other hypersomnia: Secondary | ICD-10-CM

## 2019-06-02 DIAGNOSIS — I6523 Occlusion and stenosis of bilateral carotid arteries: Secondary | ICD-10-CM

## 2019-06-02 DIAGNOSIS — G459 Transient cerebral ischemic attack, unspecified: Secondary | ICD-10-CM

## 2019-06-02 DIAGNOSIS — R0683 Snoring: Secondary | ICD-10-CM | POA: Diagnosis not present

## 2019-06-02 DIAGNOSIS — Z9889 Other specified postprocedural states: Secondary | ICD-10-CM

## 2019-06-02 DIAGNOSIS — E785 Hyperlipidemia, unspecified: Secondary | ICD-10-CM

## 2019-06-02 NOTE — Patient Instructions (Signed)
Screening for Sleep Apnea  Sleep apnea is a condition in which breathing pauses or becomes shallow during sleep. Sleep apnea screening is a test to determine if you are at risk for sleep apnea. The test is easy and only takes a few minutes. Your health care provider may ask you to have this test in preparation for surgery or as part of a physical exam. What are the symptoms of sleep apnea? Common symptoms of sleep apnea include:  Snoring.  Restless sleep.  Daytime sleepiness.  Pauses in breathing.  Choking during sleep.  Irritability.  Forgetfulness.  Trouble thinking clearly.  Depression.  Personality changes. Most people with sleep apnea are not aware that they have it. Why should I get screened? Getting screened for sleep apnea can help:  Ensure your safety. It is important for your health care providers to know whether or not you have sleep apnea, especially if you are having surgery or have other long-term (chronic) health conditions.  Improve your health and allow you to get a better night's rest. Restful sleep can help you: ? Have more energy. ? Lose weight. ? Improve high blood pressure. ? Improve diabetes management. ? Prevent stroke. ? Prevent car accidents. How is screening done? Screening usually includes being asked a list of questions about your sleep quality. Some questions you may be asked include:  Do you snore?  Is your sleep restless?  Do you have daytime sleepiness?  Has a partner or spouse told you that you stop breathing during sleep?  Have you had trouble concentrating or memory loss? If your screening test is positive, you are at risk for the condition. Further testing may be needed to confirm a diagnosis of sleep apnea. Where to find more information You can find screening tools online or at your health care clinic. For more information about sleep apnea screening and healthy sleep, visit these websites:  Centers for Disease Control and  Prevention: [employer]pl  American Sleep Apnea Association: www.sleepapnea.org Contact a health care provider if:  You think that you may have sleep apnea. Summary  Sleep apnea screening can help determine if you are at risk for sleep apnea.  It is important for your health care providers to know whether or not you have sleep apnea, especially if you are having surgery or have other chronic health conditions.  You may be asked to take a screening test for sleep apnea in preparation for surgery or as part of a physical exam. This information is not intended to replace advice given to you by your health care provider. Make sure you discuss any questions you have with your health care provider. Document Revised: 02/14/2018 Document Reviewed: 08/10/2016 Elsevier Patient Education  [place] is a surgery to remove or reshape soft tissues in the back of the throat, such as:  The tonsils.  The small piece of tissue that hangs in the back of the throat (uvula).  Soft tissue at the back of the roof of the mouth (soft palate). This surgery may be done to treat severe snoring or sleep apnea that is caused by throat blockage related to too much tissue. Tell a health care provider about:  Any allergies you have.  All medicines you are taking, including vitamins, herbs, eye drops, creams, and over-the-counter medicines.  Any problems you or family members have had with anesthetic medicines.  Any blood disorders you have.  Any surgeries you have had.  Any medical conditions you have.  Whether you  are pregnant or may be pregnant. What are the risks? Generally, this is a safe procedure. However, problems may occur, including:  Infection.  Bleeding.  Allergic reactions to medicines.  Damage to other structures or organs.  Blood clots.  Changes in your voice or sense of smell.  Difficulty  swallowing.  Having fluids come out of your nose when swallowing.  Return of apnea symptoms. What happens before the procedure? Medicines Ask your health care provider about:  Changing or stopping your regular medicines. This is especially important if you are taking diabetes medicines or blood thinners.  Taking medicines such as aspirin and ibuprofen. These medicines can thin your blood. Do not take these medicines unless your health care provider tells you to take them.  Taking over-the-counter medicines, vitamins, herbs, and supplements. Eating and drinking Follow instructions from your health care provider about eating and drinking, which may include:  8 hours before the procedure - stop eating heavy meals or foods, such as meat, fried foods, or fatty foods.  6 hours before the procedure - stop eating light meals or foods, such as toast or cereal.  6 hours before the procedure - stop drinking milk or drinks that contain milk.  2 hours before the procedure - stop drinking clear liquids. Staying hydrated Follow instructions from your health care provider about hydration, which may include:  Up to 2 hours before the procedure - you may continue to drink clear liquids, such as water, clear fruit juice, black coffee, and plain tea. Exams and tests  You will have an exam to evaluate your palate and throat.  You may be tested for sleep apnea or to determine the severity of your sleep apnea. General instructions  Your health care provider may start you on a weight reduction program.  Do not use any products that contain nicotine or tobacco for at least 4 weeks before the procedure. These products include cigarettes, e-cigarettes, and chewing tobacco. If you need help quitting, ask your health care provider.  If you use a continuous positive airway pressure (CPAP) device, follow your health care provider's instructions about how often to use it before surgery. Bring your CPAP device  to the hospital on the day of your surgery.  Plan to have someone take you home from the hospital or clinic.  If you will be going home right after the procedure, plan to have someone with you for 24 hours.  Ask your health care provider what steps will be taken to help prevent infection. What happens during the procedure?  An IV will be inserted into one of your veins.  You will be given: ? A medicine to help you relax (sedative). ? A medicine to make you fall asleep (general anesthetic).  Small incisions will be made in the soft tissue at the back of your throat.  Throat tissue that blocks your airway will be removed.  The incisions in your throat will be closed with stitches (sutures). These will absorb in the next few days or weeks. The procedure may vary among health care providers and hospitals. What happens after the procedure?  Your blood pressure, heart rate, breathing rate, and blood oxygen level will be monitored until you leave the hospital or clinic.  You may continue to receive fluids and medicine through an IV.  You will have some pain, especially when swallowing or talking. Pain medicines will be available to help you.  If you use a CPAP device, it may be placed on you after  surgery.  The head of your bed will be kept at an upright angle.  Do not drive for 24 hours if you were given a sedative during the procedure. Summary  Uvulopalatopharyngoplasty is a surgery to remove soft tissue from the back of the throat.  This surgery may be done to treat sleep apnea by removing tissue that is causing you to have difficulty breathing when you sleep.  Follow instructions from your health care provider about taking medicines and about eating and drinking before the procedure.  If you use a CPAP device, follow your health care provider's instructions about how often to use it before surgery. Bring your CPAP device to the hospital on the day of your surgery. This  information is not intended to replace advice given to you by your health care provider. Make sure you discuss any questions you have with your health care provider. Document Revised: 02/14/2018 Document Reviewed: 02/06/2018 Elsevier Patient Education  2020 [employer].

## 2019-06-02 NOTE — Progress Notes (Signed)
SLEEP MEDICINE CLINIC    Provider:  [name], MD  Primary Care Physician:  [name], MD [place] [address]     Referring Provider: [name], Md [place],  [place] [address]   Dr Erlinda Hong, stroke service          Chief Complaint according to patient   Patient presents with:    . New Patient (Initial Visit)           HISTORY OF PRESENT ILLNESS:  [name] is a 86 y.o. year old 40 or African American male patient  of the STROKE service and seen on 06/02/2019  for a *sleep evaluation.  Chief concern according to patient :     I have the pleasure of seeing [name] today, a left-handed African American male patient with a possible sleep disorder.  He   has a past medical history of Disturbances of sulphur-bearing amino-acid metabolism, Malignant neoplasm of prostate ([place]), Obstructive sleep apnea (adult) (pediatric) (2004), Osteoarthrosis, unspecified whether generalized or localized, unspecified site, Other and unspecified hyperlipidemia, Other specified cardiac dysrhythmias(427.89), Pacemaker (04/14/2013), Paroxysmal atrial fibrillation ([place]), Unspecified cerebral artery occlusion with cerebral infarction, Unspecified disorder resulting from impaired renal function, Unspecified essential hypertension, Wears glasses, and Wears partial dentures.    Specific to has consultation today that he was hospitalized on 03-18-2019 for a left brain stroke-TIA.  At the time he was 6 days short of his 67th birthday and had a history of atrial fibrillation with anticoagulation on Eliquis.  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  The patient states that 20 years ago he was diagnosed with sleep apnea but could not like  the idea of CPAP and preferred a surgical evaluation with ENT.  He presented to the emergency room with sudden onset of the inability to speak and sustained right sided weakness that had lasted about an hour.  He was evaluated by Dr. Ask you and the stroke team.  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  CT was negative for acute stroke, CTA showed multiple thalamic lacune's and basal ganglia lacunar and atherosclerosis with moderate to severe paraclinoid left-sided internal carotid artery stenosis moderate right-sided stenosis at the same location fetal origin posterior cerebral artery origin.  Significant atherosclerosis of the posterior communicating artery and right-sided vertebral artery stenosis.  However no acute infarct was known the patient also is followed by Dr. Sallyanne Kuster who had an 2D echo done revealing an EF of 60 to 65% and no cardiac embolism was identified.  The recommended blood pressure was adjusted for this patient to be between Q000111Q and Q000111Q systolic due to advanced atherosclerotic brain changes.  He had a last revisit with Rodena Piety here in the office at [facility] on 12 May 2019- since then had no new stroke symptoms.    Please note that he has a dual-chamber pacemaker implanted April 14, 2013, and that he had supposedly surgery to correct obstructive sleep apnea in the year 2001.  He also wears dentures at least partial dentures.  I reviewed his current medications.  I had the opportunity to review  the CT head without contrast and the angiogram reports.   Sleep relevant medical history: Nocturia/ Enuresis : 3 times , Sleep walking in childhood , Night terrors- no. UPPP.     Family medical /sleep history: no other family member on CPAP with OSA is known to the patient , no either insomnia, no other sleep walkers.      Social history:  Patient is retired from [employer] and lives in a household with his spouse  The couple has  4 adult children, many grandchildren.  The patient used to work in night shifts( night/ rotating,) Pets are not present. Tobacco use:  Still smoking cigarettes, 1/2  Ppd- .  ETOH use: none , Caffeine intake in form of Coffee( no) Soda( mountain dew- 2-3 day) Tea ( rare ) or energy drinks.  Hobbies : Golfing     Sleep habits are as follows: The patient's dinner time is between 5-6  PM. The patient goes to bed at 10-11 PM and he has no trouble to go to sleep,  continues to sleep for 10 hours, wakes for 3 bathroom breaks, the first time at  2 AM.  Bedroom is cool, quiet and dark.  The preferred sleep position is laterally with the support of 1 pillow. Dreams are reportedly rare/ and no longer vivid.  8-11 AM is the usual rise time. The patient wakes up spontaneously- he estimated 10 hours of sleep.   He reports not feeling refreshed or restored in AM,  There is no dry mouth, no morning headaches.  Naps are taken frequently, lasting from 20-30 minutes and are more refreshing than nocturnal sleep.    Review of Systems: Out of a complete 14 system review, the patient complains of only the following symptoms, and all other reviewed systems are negative.:  Fatigue, sleepiness , he is snoring, he reports nocturia,  fragmented sleep, HYPERSOMNIA since stroke , cerebral arthrosclerosis and many lacunes.   How likely are you to doze in the following situations: 0 = not likely, 1 = slight chance, 2 = moderate chance, 3 = high chance   Sitting and Reading? Watching Television? Sitting inactive in a public place (theater or meeting)? As a passenger in a car for an hour without a break? Lying down in the afternoon when circumstances permit? Sitting and talking to someone? Sitting quietly after lunch without alcohol? In a car, while stopped for a few minutes in traffic?   Total =  11-15/ 24  points   FSS endorsed at 38/ 63 points.   Social History   Socioeconomic History  . Marital status: Married    Spouse name: Consulting civil engineer  . Number of children: Not on file  . Years of education: Not on file  . Highest education level: Not on file  Occupational History  . Occupation: Retired  . Occupation: RETIRED    Employer: RETIRED  Tobacco Use  . Smoking status: Current Some Day Smoker    Packs/day: 0.50    Years: 30.00    Pack years: 15.00    Types: Cigarettes  . Smokeless tobacco: Never Used  . Tobacco comment:  pt stated he smokes 1 pack every 2 wks  Substance and Sexual Activity  . Alcohol use: No  . Drug use: No  . Sexual activity: Not on file    Comment: started smoking 2-3 cig daily  Other Topics Concern  . Not on file  Social History Narrative  . Not on file   Social Determinants of Health  Financial Resource Strain:   . Difficulty of Paying Living Expenses: Not on file  Food Insecurity:   . Worried About Charity fundraiser in the Last Year: Not on file  . Ran Out of Food in the Last Year: Not on file  Transportation Needs:   . Lack of Transportation (Medical): Not on file  . Lack of Transportation (Non-Medical): Not on file  Physical Activity:   . Days of Exercise per Week: Not on file  . Minutes of Exercise per Session: Not on file  Stress:   . Feeling of Stress : Not on file  Social Connections:   . Frequency of Communication with Friends and Family: Not on file  . Frequency of Social Gatherings with Friends and Family: Not on file  . Attends Religious Services: Not on file  . Active Member of Clubs or Organizations: Not on file  . Attends Archivist Meetings: Not on file  . Marital Status: Not on file    Family History  Problem Relation Age of Onset  . Prostate cancer Father     Past Medical History:  Diagnosis Date  . Disturbances of sulphur-bearing amino-acid metabolism   . Malignant neoplasm of prostate ([place])   .  Obstructive sleep apnea (adult) (pediatric) 2004   surgery to coorrect  . Osteoarthrosis, unspecified whether generalized or localized, unspecified site   . Other and unspecified hyperlipidemia   . Other specified cardiac dysrhythmias(427.89)   . 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 Paroxysmal atrial fibrillation ([facility])   . Unspecified cerebral artery occlusion with cerebral infarction   . Unspecified disorder resulting from impaired renal function   . Unspecified essential hypertension   . Wears glasses   . Wears partial dentures    upper partial    Past Surgical History:  Procedure Laterality Date  . APPLICATION OF A-CELL OF EXTREMITY Right 08/12/2013   Procedure: APPLICATION OF A-CELL OF EXTREMITY;  Surgeon: Theodoro Kos, DO;  Location: [facility];  Service: Plastics;  Laterality: Right;  . CHOLECYSTECTOMY  1987  . COLONOSCOPY    . ent surgery  2004   correct snoring  . INGUINAL HERNIA REPAIR     left  . INSERT / REPLACE / REMOVE PACEMAKER  04/14/2013   dual chamber   / Dr Sallyanne Kuster  . MINOR IRRIGATION AND DEBRIDEMENT OF WOUND Right 03/04/2013   Procedure: MINOR IRRIGATION AND DEBRIDEMENT OF WOUND;  Surgeon: Theodoro Kos, DO;  Location: [facility];  Service: Plastics;  Laterality: Right;  . ORCHIECTOMY  2002  . PERMANENT PACEMAKER INSERTION N/A 04/14/2013   Procedure: PERMANENT PACEMAKER INSERTION;  Surgeon: [name], MD;  Location: [facility];  Service: Cardiovascular;  Laterality: N/A;  . SKIN SPLIT GRAFT Right 08/12/2013   Procedure: SKIN GRAFT SPLIT THICKNESS WITH PLACEMENT OF VAC TO RIGHT LOWER LEG/PLACEMENT OF ACELL TO RIGHT UPPER THIGH AREA (HARVEST SITE);  Surgeon: Theodoro Kos, DO;  Location: [facility];  Service: Plastics;  Laterality: Right;     Current Outpatient Medications on File Prior to Visit  Medication Sig Dispense Refill  . albuterol (VENTOLIN HFA) 108 (90 Base) MCG/ACT inhaler Inhale 2 puffs  into the lungs every 6 (six) hours as needed for wheezing or shortness of breath. 18 g 3  . apixaban (ELIQUIS) 2.5 MG TABS tablet Take 1 tablet (2.5 mg total) by mouth 2 (two) times daily. 60 tablet 0  . aspirin EC 81 MG EC tablet Take 1  tablet (81 mg total) by mouth daily. 30 tablet 0  . atorvastatin (LIPITOR) 40 MG tablet Take 1 tablet (40 mg total) by mouth daily at 6 PM. 90 tablet 1  . calcium-vitamin D (OSCAL WITH D) 500-200 MG-UNIT tablet Take 1 tablet by mouth.    . diclofenac sodium (VOLTAREN) 1 % GEL Apply 4 g topically 4 (four) times daily. (Patient taking differently: Apply 4 g topically 4 (four) times daily as needed (knee pain). ) 100 g 2  . metoprolol tartrate (LOPRESSOR) 25 MG tablet Take 1 tablet (25 mg total) by mouth 2 (two) times daily. 60 tablet 0  . Multiple Vitamin (MULTIVITAMIN WITH MINERALS) TABS tablet Take 1 tablet by mouth daily.    [name] 40 MG capsule Take 160 mg by mouth daily.      No current facility-administered medications on file prior to visit.    No Known Allergies  Physical exam:  Today's Vitals   06/02/19 1303  BP: (!) 149/101  Pulse: 85  Temp: (!) 97.5 F (36.4 C)  Weight: 208 lb (94.3 kg)  Height: 5\' 11"  (1.803 m)   Body mass index is 29.01 kg/m.   Wt Readings from Last 3 Encounters:  06/02/19 208 lb (94.3 kg)  05/12/19 208 lb (94.3 kg)  03/27/19 201 lb 11.2 oz (91.5 kg)     Ht Readings from Last 3 Encounters:  06/02/19 5\' 11"  (1.803 m)  05/12/19 5\' 11"  (1.803 m)  03/27/19 5\' 11"  (1.803 m)      General: The patient is awake, alert and appears not in acute distress. The patient is well groomed. Head: Normocephalic, atraumatic. Neck is supple. Mallampati small airway status post UPPP,  neck circumference:16 inches . Nasal airflow patent.  Retrognathia is  seen.  Dental status: partialdentures  Cardiovascular:  Regular rate and cardiac rhythm by pulse,  without distended neck veins. Respiratory: Lungs are clear to auscultation.   Skin:  Without evidence of ankle edema, or rash. Trunk: The patient's posture is erect.   Neurologic exam : The patient is awake and alert, oriented to place and time.   Memory subjective described as intact.  Attention span & concentration ability appears normal.  Speech is reluctant   without dysarthria, dysphonia - I suspect some delayed speech and poverty of speech related to  aphasia.  Mood and affect are appropriate.   Cranial nerves: no loss of smell or taste reported . Pupils are equal and briskly reactive to light. Funduscopic exam deferred.   Extraocular movements in vertical and horizontal planes were intact and without nystagmus. No Diplopia. Visual fields by finger perimetry are intact. Hearing was intact to soft voice and finger rubbing.    Facial sensation intact to fine touch.   Facial motor strength is symmetric and tongue is in midline.  Neck ROM : rotation, tilt and flexion extension were normal for age and shoulder shrug was symmetrical.    Motor exam:  Symmetric bulk, tone and ROM.   Normal tone without cog- wheeling, symmetric grip strength .   Sensory:  Fine touch, pinprick and vibration were tested  and  normal.  Proprioception tested in the upper extremities was normal.   Coordination: Rapid alternating movements in the fingers/hands were of normal speed.  The Finger-to-nose maneuver was intact without evidence of ataxia, dysmetria or tremor.   Gait and station: Patient could rise unassisted from a seated position, walked without assistive device and the patient turned with 4 steps.  Toe and heel walk  were deferred.  Deep tendon reflexes: in the  upper and lower extremities are symmetric and intact.  Babinski response was deferred .       After spending a total time of 40 minutes  for physical and neurologic examination, review of laboratory studies,  personal review of imaging studies, reports and results of other testing and review of referral  information / records as far as provided in visit, I have established the following assessments:  1) [name] has known atrial fibrillation and is kept on anticoagulation for many month- he also has a dual chamber pacemaker.  Evidence of atrial fibrillation causing embolism was not recorded, the patient has overall progressed cerebral arthrosclerotic changes.   2) TIA , left brain symptoms.   3)  Sleep apnea can be a risk factor for paroxysmal atrial fibrillation, but this patient is rate controlled, paced and anticoagulated. I am not sure how much a diagnosis of OSA will help is further treatment. His UPPPmay have reduced his baseline apnea, most likely it just reduced snoring.     My Plan is to proceed with:  1) OSA screening - the patient can either be evaluated by [facility] or attended sleep study.   given the UPPP history , I prefer an attended sleep study.   I would like to thank Dr Benn Moulder and Dr Erlinda Hong  for allowing me to meet with and to take care of this pleasant patient.   I plan to follow up through our NP within 2-3  month.   CC: I will share my notes with PCP.  Electronically signed by: [name], MD 06/02/2019 1:15 PM  [facility] Board certified by The [employer] of Sleep Medicine and Diplomate of the [employer] of Sleep Medicine. Board certified In Neurology through the [place], Fellow of the [employer] of Neurology. Medical Director of [facility].

## 2019-06-07 ENCOUNTER — Ambulatory Visit: Payer: Medicare HMO

## 2019-06-08 ENCOUNTER — Other Ambulatory Visit: Payer: Self-pay

## 2019-06-08 MED ORDER — APIXABAN 2.5 MG PO TABS: 2.5000 mg | ORAL_TABLET | Freq: Two times a day (BID) | ORAL | 1 refills | Status: DC

## 2019-06-22 ENCOUNTER — Telehealth: Payer: Self-pay | Admitting: Family Medicine

## 2019-06-22 NOTE — Telephone Encounter (Signed)
No one should need a note for this as Eliquis nor any other anticoagulation is a contraindication for Covid vaccine.  Okay to produce note for him that we have approved for him to get Covid vaccine

## 2019-06-22 NOTE — Telephone Encounter (Signed)
Please see message. °

## 2019-06-22 NOTE — Telephone Encounter (Signed)
Called patient and spoke to his wife and let her know that the letter is ready for the patient and they can pick up at the front desk. Wife verbalized an understanding.

## 2019-06-22 NOTE — Telephone Encounter (Signed)
In order for pt to receive the COVID vaccine, he has to provide a note from his PCP stating it is okay since he is on Eliquis   Pt can be reached at [phone]

## 2019-06-23 ENCOUNTER — Telehealth: Payer: Self-pay | Admitting: *Deleted

## 2019-06-23 ENCOUNTER — Other Ambulatory Visit: Payer: Self-pay | Admitting: *Deleted

## 2019-06-23 MED ORDER — APIXABAN 2.5 MG PO TABS: 2.5000 mg | ORAL_TABLET | Freq: Two times a day (BID) | ORAL | 3 refills | Status: DC

## 2019-06-23 NOTE — Telephone Encounter (Signed)
Assistance for Eliquis has been faxed to [facility]

## 2019-07-01 ENCOUNTER — Telehealth: Payer: Self-pay | Admitting: Family Medicine

## 2019-07-01 NOTE — Telephone Encounter (Signed)
Left message for patient to call back and schedule Medicare Annual Wellness Visit (AWV) either virtually,audio only or in person (whichever the patient prefers--45 MINUTES).  Last AWV 3.20.19; please schedule at anytime with [facility]-Nurse Health Advisor at [facility] at [place].

## 2019-07-06 ENCOUNTER — Telehealth: Payer: Self-pay

## 2019-07-06 NOTE — Telephone Encounter (Signed)
We have attempted to call the patient two times to schedule sleep study.  Patient has been unavailable at the phone numbers we have on file and has not returned our calls. If patient calls back we will schedule them for their sleep study.  

## 2019-07-10 NOTE — Telephone Encounter (Signed)
[name] stated that they only needed the number of people in the household. They have been advised that it was 2.

## 2019-07-10 NOTE — Telephone Encounter (Signed)
Call placed to the wife to let her know that we would follow up with [name] to see if they had made a decision.

## 2019-07-10 NOTE — Telephone Encounter (Signed)
Patient's wife, Remo Lipps, following up on patient assistance.

## 2019-07-13 NOTE — Progress Notes (Signed)
Virtual Visit via Telephone Note   This visit type was conducted due to national recommendations for restrictions regarding the COVID-19 Pandemic (e.g. social distancing) in an effort to limit this patient's exposure and mitigate transmission in our community.  Due to his co-morbid illnesses, this patient is at least at moderate risk for complications without adequate follow up.  This format is felt to be most appropriate for this patient at this time.  The patient did not have access to video technology/had technical difficulties with video requiring transitioning to audio format only (telephone).  All issues noted in this document were discussed and addressed.  No physical exam could be performed with this format.  Please refer to the patient's chart for his  consent to telehealth for [facility].  Evaluation Performed:  Follow-up visit  This visit type was conducted due to national recommendations for restrictions regarding the COVID-19 Pandemic (e.g. social distancing).  This format is felt to be most appropriate for this patient at this time.  All issues noted in this document were discussed and addressed.  No physical exam was performed (except for noted visual exam findings with Video Visits).  Please refer to the patient's chart (MyChart message for video visits and phone note for telephone visits) for the patient's consent to telehealth for [facility]  Date:  07/14/2019   ID:  [name], DOB 05/26/1932, MRN [id number]  Patient Location:  [place] [address]   Provider location:    [place] [address] Office [phone] Fax [phone]   PCP:  [name], MD  Cardiologist:  [name], MD  Electrophysiologist:  None   Chief Complaint:  Follow up  History of Present Illness:    KEITARO SARPY is a 86 y.o. male who presents via audio/video conferencing for a telehealth  visit today.  Patient verified DOB and address.  The patient does not symptoms concerning for COVID-19 infection (fever, chills, cough, or new SHORTNESS OF BREATH).   Abrham L. Javorsky 86 year old male presents today for follow-up evaluation of his essential hypertension, PAF(on eliquis), tachybradycardia syndrome, bilateral carotid artery stenosis, OSA, PPM ([employer] 04/14/13), and edema.  His PMH also includes HTN, HLD, OSA and prior TIA.  He was admitted 03/18/2019-03/20/2019 with right arm weakness and aphasia.  He presented to the emergency department on 03/18/2019 via EMS after an episode of slurred speech and right arm weakness that started around 11 AM.  His wife was concerned for CVA and called EMS.  When EMS arrived his symptoms had resolved.  At that time he did not have any other complaint.  No seizure activity was noted.  He was noted to be slightly lethargic and had generalized weakness.  His speech had returned to normal.  He had had no recent travel or any sick contacts.  He was last seen by Dr. Sallyanne Kuster on 03/20/2019 during his hospital admission.  At that time he was feeling well, had no chest pain, shortness of breath, headaches, lightheadedness or dizziness.  His anticoagulation was continued.  A download from his pacemaker showed he had been in atrial fibrillation less than 1% over the past year and his last episode was over a month ago.  His pacemaker was adjusted so that he may have an MRI.  He was continued on his metoprolol, amlodipine, and losartan.  His creatinine was stable at 2.4.  He is seen virtually today and states he feels  well.  He has been very sedentary and his wife states that he is "lazy".  He has been taking naps through the day, has less active, and sleeps late.  He is used CPAP in the past and is in the process of starting to use his CPAP again with Dr. Brett Fairy.  He is also been having some knee discomfort and asks about bilateral knee injections.  His blood  pressure today is elevated.  When asked about his blood pressure medication he states he has not taken it today and that he has been out of it for the past couple of days.  He presented to the pharmacy this morning however the medication had not been refilled.  He contacted Dr. Anastasio Auerbach office for the refill.  We will refill his metoprolol tartrate today, I will give him salty 6 sheet, I will give him the sleep hygiene information as well.  I will have him follow-up in 2 weeks.  Today he denies chest pain, shortness of breath, lower extremity edema, fatigue, palpitations, melena, hematuria, hemoptysis, diaphoresis, weakness, presyncope, syncope, orthopnea, and PND.  Prior CV studies:   The following studies were reviewed today:  ECG personally reviewed by me today-none today  EKG 03/19/2019 Sinus rhythm 71 bpm  Echocardiogram 03/18/19: IMPRESSIONS      1. Left ventricular ejection fraction, by visual estimation, is 60 to 65%. The left ventricle has normal function. There is moderately increased left ventricular hypertrophy of the basal septum.  2. There is akinesis of the basal inferior wall.  3. Left ventricular diastolic parameters are consistent with Grade I diastolic dysfunction (impaired relaxation).  4. The right ventricular size is not well visualized. No increase in right ventricular wall thickness.  5. Left atrial size was normal.  6. Right atrial size was normal.  7. The mitral valve is normal in structure. Mild mitral valve regurgitation. No evidence of mitral stenosis.  8. The tricuspid valve is normal in structure. Tricuspid valve regurgitation is not demonstrated.  9. The aortic valve is normal in structure. Aortic valve regurgitation is mild. Mild aortic valve sclerosis without stenosis. 10. The pulmonic valve was normal in structure. Pulmonic valve regurgitation is trivial. 11. The inferior vena cava is normal in size with greater than 50% respiratory variability, suggesting  right atrial pressure of 3 mmHg. 12. TR signal is inadequate for assessing pulmonary artery systolic pressure.  Past Medical History:  Diagnosis Date  . Disturbances of sulphur-bearing amino-acid metabolism   . Malignant neoplasm of prostate ([place])   . Obstructive sleep apnea (adult) (pediatric) 2004   surgery to coorrect  . Osteoarthrosis, unspecified whether generalized or localized, unspecified site   . Other and unspecified hyperlipidemia   . Other specified cardiac dysrhythmias(427.89)   . 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 Paroxysmal atrial fibrillation (HCC)   . Unspecified cerebral artery occlusion with cerebral infarction   . Unspecified disorder resulting from impaired renal function   . Unspecified essential hypertension   . Wears glasses   . Wears partial dentures    upper partial   Past Surgical History:  Procedure Laterality Date  . APPLICATION OF A-CELL OF EXTREMITY Right 08/12/2013   Procedure: APPLICATION OF A-CELL OF EXTREMITY;  Surgeon: Theodoro Kos, DO;  Location: [place];  Service: Plastics;  Laterality: Right;  . CHOLECYSTECTOMY  1987  . COLONOSCOPY    . ent surgery  2004   correct snoring  . INGUINAL HERNIA REPAIR     left  .  INSERT / REPLACE / REMOVE PACEMAKER  04/14/2013   dual chamber   / Dr Sallyanne Kuster  . MINOR IRRIGATION AND DEBRIDEMENT OF WOUND Right 03/04/2013   Procedure: MINOR IRRIGATION AND DEBRIDEMENT OF WOUND;  Surgeon: Theodoro Kos, DO;  Location: [place];  Service: Plastics;  Laterality: Right;  . ORCHIECTOMY  2002  . PERMANENT PACEMAKER INSERTION N/A 04/14/2013   Procedure: PERMANENT PACEMAKER INSERTION;  Surgeon: [name], MD;  Location: [place] CATH LAB;  Service: Cardiovascular;  Laterality: N/A;  . SKIN SPLIT GRAFT Right 08/12/2013   Procedure: SKIN GRAFT SPLIT THICKNESS WITH PLACEMENT OF VAC TO RIGHT LOWER LEG/PLACEMENT OF ACELL TO RIGHT UPPER THIGH AREA (HARVEST SITE);  Surgeon: Theodoro Kos, DO;  Location: [place];  Service: Plastics;  Laterality: Right;     Current Meds  Medication Sig  . albuterol (VENTOLIN HFA) 108 (90 Base) MCG/ACT inhaler Inhale 2 puffs into the lungs every 6 (six) hours as needed for wheezing or shortness of breath.  Marland Kitchen apixaban (ELIQUIS) 2.5 MG TABS tablet Take 1 tablet (2.5 mg total) by mouth 2 (two) times daily.  . Ascorbic Acid (VITAMIN C) 1000 MG tablet Take 1,000 mg by mouth daily.  Marland Kitchen aspirin EC 81 MG EC tablet Take 1 tablet (81 mg total) by mouth daily.  Marland Kitchen atorvastatin (LIPITOR) 40 MG tablet Take 1 tablet (40 mg total) by mouth daily at 6 PM.  . cholecalciferol (VITAMIN D3) 25 MCG (1000 UNIT) tablet Take 1,000 Units by mouth daily.  . metoprolol tartrate (LOPRESSOR) 25 MG tablet Take 1 tablet (25 mg total) by mouth 2 (two) times daily.  . Multiple Vitamin (MULTIVITAMIN WITH MINERALS) TABS tablet Take 1 tablet by mouth daily.  [name] 40 MG capsule Take 160 mg by mouth daily.   . [DISCONTINUED] metoprolol tartrate (LOPRESSOR) 25 MG tablet Take 1 tablet (25 mg total) by mouth 2 (two) times daily.  . [DISCONTINUED] metoprolol tartrate (LOPRESSOR) 25 MG tablet Take 1 tablet (25 mg total) by mouth 2 (two) times daily.     Allergies:   Patient has no known allergies.   Social History   Tobacco Use  . Smoking status: Current Some Day Smoker    Packs/day: 0.50    Years: 30.00    Pack years: 15.00    Types: Cigarettes  . Smokeless tobacco: Never Used  . Tobacco comment:  pt stated he smokes 1 pack every 2 wks  Substance Use Topics  . Alcohol use: No  . Drug use: No     Family Hx: The patient's family history includes Prostate cancer in his father.  ROS:   Please see the history of present illness.     All other systems reviewed and are negative.   Labs/Other Tests and Data Reviewed:    Recent Labs: 03/18/2019: ALT 12; BUN 22; Creatinine, Ser 2.00; Hemoglobin 13.9; Platelets 184; Potassium 4.0; Sodium 146    Recent Lipid Panel Lab Results  Component Value Date/Time   CHOL 181 05/12/2019 09:45 AM   TRIG 206 (H) 05/12/2019 09:45 AM   TRIG 77 04/11/2006 09:06 AM   HDL 42 05/12/2019 09:45 AM   CHOLHDL 4.3 05/12/2019 09:45 AM   CHOLHDL 3.6 03/19/2019 07:15 AM   LDLCALC 103 (H) 05/12/2019 09:45 AM   LDLDIRECT 112.0 05/17/2017 08:55 AM    Wt Readings from Last 3 Encounters:  07/14/19 208 lb (94.3 kg)  06/02/19 208 lb (94.3 kg)  05/12/19 208 lb (94.3 kg)  Exam:    Vital Signs:  BP (!) 179/116 (BP Location: Left Arm, Patient Position: Sitting)   Pulse 79   Wt 208 lb (94.3 kg)   BMI 29.01 kg/m    Well nourished, well developed male in no  acute distress.   ASSESSMENT & PLAN:    1.  Paroxysmal atrial fibrillation/sick sinus syndrome -PPM 04/2013.  Heart rate today 79. Continue Eliquis 2.5 mg twice daily Continue metoprolol 25 mg twice daily Heart healthy low-sodium diet Increase physical activity as tolerated  Essential hypertension-BP today 170/93.  Well-controlled at home when taking metoprolol tartrate 25 mg twice daily.  Has been out of medication for the past few days.  He called his PCP to refill prescription. Resume metoprolol tartrate 25 mg twice daily Heart healthy low-sodium diet-salty 6 given Increase physical activity as tolerated-15 minutes of mild to moderate physical activity daily.  TIA/CVA -03/18/2019 presented to the emergency department after an a bout of slurred speech and right arm weakness.  Thought to be left brain TIA with source believed to be ICA siphon stenosis, left more than right.  MRI showed punctate acute cortical infarct versus artifact within the left superior frontal gyrus. Continue atorvastatin Followed by neurology  CKD stage III-creatinine 2.00 on 03/18/2019. Monitored by PCP  Obstructive sleep apnea-currently not using CPAP.  Working with Dr. Brett Fairy on CPAP and CPAP supplies. Sleep hygiene sheet provided  Disposition: Follow-up with  me in 2 weeks.  COVID-19 Education: The signs and symptoms of COVID-19 were discussed with the patient and how to seek care for testing (follow up with PCP or arrange E-visit).  The importance of social distancing was discussed today.  Patient Risk:   After full review of this patients clinical status, I feel that they are at least moderate risk at this time.  Time:   Today, I have spent 15 minutes with the patient with telehealth technology discussing diet, exercise, medications, blood pressure.     Medication Adjustments/Labs and Tests Ordered: Current medicines are reviewed at length with the patient today.  Concerns regarding medicines are outlined above.   Tests Ordered: No orders of the defined types were placed in this encounter.  Medication Changes: Meds ordered this encounter  Medications  . DISCONTD: metoprolol tartrate (LOPRESSOR) 25 MG tablet    Sig: Take 1 tablet (25 mg total) by mouth 2 (two) times daily.    Dispense:  60 tablet    Refill:  6  . metoprolol tartrate (LOPRESSOR) 25 MG tablet    Sig: Take 1 tablet (25 mg total) by mouth 2 (two) times daily.    Dispense:  180 tablet    Refill:  3    Disposition:  in 2 week(s)  Signed,  Jossie Ng. [facility] [place] [address] Office [phone] Fax [phone]

## 2019-07-14 ENCOUNTER — Other Ambulatory Visit: Payer: Self-pay

## 2019-07-14 ENCOUNTER — Telehealth (INDEPENDENT_AMBULATORY_CARE_PROVIDER_SITE_OTHER): Payer: Medicare HMO | Admitting: General Practice

## 2019-07-14 VITALS — BP 179/116 | HR 79 | Wt 208.0 lb

## 2019-07-14 DIAGNOSIS — I480 Paroxysmal atrial fibrillation: Secondary | ICD-10-CM

## 2019-07-14 DIAGNOSIS — I10 Essential (primary) hypertension: Secondary | ICD-10-CM | POA: Diagnosis not present

## 2019-07-14 DIAGNOSIS — N183 Chronic kidney disease, stage 3 unspecified: Secondary | ICD-10-CM

## 2019-07-14 DIAGNOSIS — G459 Transient cerebral ischemic attack, unspecified: Secondary | ICD-10-CM

## 2019-07-14 MED ORDER — METOPROLOL TARTRATE 25 MG PO TABS: 25.0000 mg | ORAL_TABLET | Freq: Two times a day (BID) | ORAL | 6 refills | Status: DC

## 2019-07-14 MED ORDER — METOPROLOL TARTRATE 25 MG PO TABS: 25.0000 mg | ORAL_TABLET | Freq: Two times a day (BID) | ORAL | 3 refills | Status: DC

## 2019-07-14 NOTE — Patient Instructions (Addendum)
Special Instructions: PLEASE SEE ATTACHED SLEEP HYGIENE ATTACHED  PLEASE READ AND FOLLOW SALTY 6 ATTACHED  INCREASE PHYSICAL ACTIVITY AS TOLERATED-15 MINUTES A DAY LIGHT/MODERATE  PLEASE FOLLOW UP WITH YOUR CPAP PROVIDER   Follow-Up: 2 WEEKS   Virtual Visit  .  JESSE CLEAVER, FNP-C  07-30-19 @ 11:15 AM  At [facility], you and your health needs are our priority.  As part of our continuing mission to provide you with exceptional heart care, we have created designated Provider Care Teams.  These Care Teams include your primary Cardiologist (physician) and Advanced Practice Providers (APPs -  Physician Assistants and Nurse Practitioners) who all work together to provide you with the care you need, when you need it.  Reduce your risk of getting COVID-19 With your heart disease it is especially important for people at increased risk of severe illness from COVID-19, and those who live with them, to protect themselves from getting COVID-19. The best way to protect yourself and to help reduce the spread of the virus that causes COVID-19 is to: Marland Kitchen Limit your interactions with other people as much as possible. . Take COVID-19 when you do interact with others. If you start feeling sick and think you may have COVID-19, get in touch with your healthcare provider within 24 hours.  Thank you for choosing [facility] at [facility]!!       Quality Sleep Information, Adult Quality sleep is important for your mental and physical health. It also improves your quality of life. Quality sleep means you:  Are asleep for most of the time you are in bed.  Fall asleep within 30 minutes.  Wake up no more than once a night.  Are awake for no longer than 20 minutes if you do wake up during the night. Most adults need 7-8 hours of quality sleep each night. How can poor sleep affect me? If you do not get enough quality sleep, you may have:  Mood swings.  Daytime sleepiness.  Confusion.  Decreased  reaction time.  Sleep disorders, such as insomnia and sleep apnea.  Difficulty with: ? Solving problems. ? Coping with stress. ? Paying attention. These issues may affect your performance and productivity at work, school, and at home. Lack of sleep may also put you at higher risk for accidents, suicide, and risky behaviors. If you do not get quality sleep you may also be at higher risk for several health problems, including:  Infections.  Type 2 diabetes.  Heart disease.  High blood pressure.  Obesity.  Worsening of long-term conditions, like arthritis, kidney disease, depression, Parkinson's disease, and epilepsy. What actions can I take to get more quality sleep?      Stick to a sleep schedule. Go to sleep and wake up at about the same time each day. Do not try to sleep less on weekdays and make up for lost sleep on weekends. This does not work.  Try to get about 30 minutes of exercise on most days. Do not exercise 2-3 hours before going to bed.  Limit naps during the day to 30 minutes or less.  Do not use any products that contain nicotine or tobacco, such as cigarettes or e-cigarettes. If you need help quitting, ask your health care provider.  Do not drink caffeinated beverages for at least 8 hours before going to bed. Coffee, tea, and some sodas contain caffeine.  Do not drink alcohol close to bedtime.  Do not eat large meals close to bedtime.  Do not take  naps in the late afternoon.  Try to get at least 30 minutes of sunlight every day. Morning sunlight is best.  Make time to relax before bed. Reading, listening to music, or taking a hot bath promotes quality sleep.  Make your bedroom a place that promotes quality sleep. Keep your bedroom dark, quiet, and at a comfortable room temperature. Make sure your bed is comfortable. Take out sleep distractions like TV, a computer, smartphone, and bright lights.  If you are lying awake in bed for longer than 20 minutes,  get up and do a relaxing activity until you feel sleepy.  Work with your health care provider to treat medical conditions that may affect sleeping, such as: ? Nasal obstruction. ? Snoring. ? Sleep apnea and other sleep disorders.  Talk to your health care provider if you think any of your prescription medicines may cause you to have difficulty falling or staying asleep.  If you have sleep problems, talk with a sleep consultant. If you think you have a sleep disorder, talk with your health care provider about getting evaluated by a specialist. Where to find more information  [place] website: https://sleepfoundation.org  National Heart, Lung, and [place] ([place]): http://www.saunders.info/.pdf  Centers for Disease Control and Prevention (CDC): LearningDermatology.pl Contact a health care provider if you:  Have trouble getting to sleep or staying asleep.  Often wake up very early in the morning and cannot get back to sleep.  Have daytime sleepiness.  Have daytime sleep attacks of suddenly falling asleep and sudden muscle weakness (narcolepsy).  Have a tingling sensation in your legs with a strong urge to move your legs (restless legs syndrome).  Stop breathing briefly during sleep (sleep apnea).  Think you have a sleep disorder or are taking a medicine that is affecting your quality of sleep. Summary  Most adults need 7-8 hours of quality sleep each night.  Getting enough quality sleep is an important part of health and well-being.  Make your bedroom a place that promotes quality sleep and avoid things that may cause you to have poor sleep, such as alcohol, caffeine, smoking, and large meals.  Talk to your health care provider if you have trouble falling asleep or staying asleep. This information is not intended to replace advice given to you by your health care provider. Make sure you discuss any questions you have  with your health care provider.

## 2019-07-14 NOTE — Progress Notes (Signed)
TY

## 2019-07-22 ENCOUNTER — Ambulatory Visit (INDEPENDENT_AMBULATORY_CARE_PROVIDER_SITE_OTHER): Payer: Medicare HMO | Admitting: *Deleted

## 2019-07-22 DIAGNOSIS — Z950 Presence of cardiac pacemaker: Secondary | ICD-10-CM | POA: Diagnosis not present

## 2019-07-22 LAB — CUP PACEART REMOTE DEVICE CHECK
Battery Remaining Longevity: 39 mo
Battery Voltage: 2.96 V
Brady Statistic AP VP Percent: 7.34 %
Brady Statistic AP VS Percent: 90.4 %
Brady Statistic AS VP Percent: 0.04 %
Brady Statistic AS VS Percent: 2.22 %
Brady Statistic RA Percent Paced: 97.06 %
Brady Statistic RV Percent Paced: 7.17 %
Date Time Interrogation Session: 20210310093413
Implantable Lead Implant Date: 20141202
Implantable Lead Implant Date: 20141202
Implantable Lead Location: 753859
Implantable Lead Location: 753860
Implantable Lead Model: 5076
Implantable Lead Model: 5076
Implantable Pulse Generator Implant Date: 20141202
Lead Channel Impedance Value: 361 Ohm
Lead Channel Impedance Value: 361 Ohm
Lead Channel Impedance Value: 418 Ohm
Lead Channel Impedance Value: 456 Ohm
Lead Channel Pacing Threshold Amplitude: 0.625 V
Lead Channel Pacing Threshold Amplitude: 1.5 V
Lead Channel Pacing Threshold Pulse Width: 0.4 ms
Lead Channel Pacing Threshold Pulse Width: 0.4 ms
Lead Channel Sensing Intrinsic Amplitude: 15.875 mV
Lead Channel Sensing Intrinsic Amplitude: 15.875 mV
Lead Channel Sensing Intrinsic Amplitude: 2.375 mV
Lead Channel Sensing Intrinsic Amplitude: 2.375 mV
Lead Channel Setting Pacing Amplitude: 2 V
Lead Channel Setting Pacing Amplitude: 3 V
Lead Channel Setting Pacing Pulse Width: 0.4 ms
Lead Channel Setting Sensing Sensitivity: 0.9 mV

## 2019-07-23 NOTE — Progress Notes (Signed)
PPM Remote  

## 2019-07-26 ENCOUNTER — Ambulatory Visit (INDEPENDENT_AMBULATORY_CARE_PROVIDER_SITE_OTHER): Payer: Medicare HMO | Admitting: Neurology

## 2019-07-26 DIAGNOSIS — R063 Periodic breathing: Secondary | ICD-10-CM

## 2019-07-26 DIAGNOSIS — G4733 Obstructive sleep apnea (adult) (pediatric): Secondary | ICD-10-CM

## 2019-07-26 DIAGNOSIS — Z720 Tobacco use: Secondary | ICD-10-CM

## 2019-07-26 DIAGNOSIS — G459 Transient cerebral ischemic attack, unspecified: Secondary | ICD-10-CM

## 2019-07-26 DIAGNOSIS — R0683 Snoring: Secondary | ICD-10-CM

## 2019-07-26 DIAGNOSIS — I10 Essential (primary) hypertension: Secondary | ICD-10-CM

## 2019-07-26 DIAGNOSIS — I480 Paroxysmal atrial fibrillation: Secondary | ICD-10-CM

## 2019-07-26 DIAGNOSIS — G4719 Other hypersomnia: Secondary | ICD-10-CM

## 2019-07-26 DIAGNOSIS — E785 Hyperlipidemia, unspecified: Secondary | ICD-10-CM

## 2019-07-26 DIAGNOSIS — Z9889 Other specified postprocedural states: Secondary | ICD-10-CM

## 2019-07-26 DIAGNOSIS — I6523 Occlusion and stenosis of bilateral carotid arteries: Secondary | ICD-10-CM

## 2019-07-27 NOTE — Progress Notes (Signed)
Virtual Visit via Telephone Note   This visit type was conducted due to national recommendations for restrictions regarding the COVID-19 Pandemic (e.g. social distancing) in an effort to limit this patient's exposure and mitigate transmission in our community.  Due to his co-morbid illnesses, this patient is at least at moderate risk for complications without adequate follow up.  This format is felt to be most appropriate for this patient at this time.  The patient did not have access to video technology/had technical difficulties with video requiring transitioning to audio format only (telephone).  All issues noted in this document were discussed and addressed.  No physical exam could be performed with this format.  Please refer to the patient's chart for his  consent to telehealth for [facility] ([facility]  Evaluation Performed:  Follow-up visit  This visit type was conducted due to national recommendations for restrictions regarding the COVID-19 Pandemic (e.g. social distancing).  This format is felt to be most appropriate for this patient at this time.  All issues noted in this document were discussed and addressed.  No physical exam was performed (except for noted visual exam findings with Video Visits).  Please refer to the patient's chart (MyChart message for video visits and phone note for telephone visits) for the patient's consent to telehealth for [facility]  Date:  07/28/2019   ID:  [name], DOB [birth year], MRN [id number]  Patient Location:  [place] [address]   Provider location:   [address] Office [phone] Fax [phone]   PCP:  [name], MD  Cardiologist:  [name], MD  Electrophysiologist:  None   Chief Complaint: Follow-up  History of Present Illness:    [name] is a 86 y.o. male who presents via audio/video conferencing for a telehealth  visit today.  Patient verified DOB and address.  The patient does not symptoms concerning for COVID-19 infection (fever, chills, cough, or new SHORTNESS OF BREATH).   Eads 86 year old male presents today for follow-up evaluation of his essential hypertension, PAF(on eliquis), tachybradycardia syndrome, bilateral carotid artery stenosis, OSA, PPM ([employer] 04/14/13), and edema.  His PMH also includes HTN, HLD, OSA and prior TIA.  He was admitted 03/18/2019-03/20/2019 with right arm weakness and aphasia.  He presented to the emergency department on 03/18/2019 via EMS after an episode of slurred speech and right arm weakness that started around 11 AM.  His wife was concerned for CVA and called EMS.  When EMS arrived his symptoms had resolved.  At that time he did not have any other complaint.  No seizure activity was noted.  He was noted to be slightly lethargic and had generalized weakness.  His speech had returned to normal.  He had had no recent travel or any sick contacts.  He was last seen by Dr. Sallyanne Kuster on 03/20/2019 during his hospital admission.  At that time he was feeling well, had no chest pain, shortness of breath, headaches, lightheadedness or dizziness.  His anticoagulation was continued.  A download from his pacemaker showed he had been in atrial fibrillation less than 1% over the past year and his last episode was over a month ago.  His pacemaker was adjusted so that he may have an MRI.  He was continued on his metoprolol, amlodipine, and losartan.  His creatinine was stable at 2.4.  He is seen virtually 07/14/2019 and stated he felt well.  He had been  very sedentary and his wife stated that he was "lazy".  He had been taking naps through the day, had been less active, and was sleeping late.  He  used CPAP in the past and was in the process of starting to use his CPAP again with Dr. Brett Fairy.  He was also been having some knee discomfort and asked about bilateral knee injections.  His  blood pressure was  elevated.  When asked about his blood pressure medication he stated he had not taken it that day and that he had been out of it for the past couple of days.  He presented to the pharmacy that morning however the medication had not been refilled.  He contacted Dr. Anastasio Auerbach office for the refill.  We refilled his metoprolol tartrate.  He is seen virtually today in follow-up and states he feels well.  He has been taking his metoprolol as prescribed and notices that his blood pressure is in the 150s over 80s.  He has increased his physical activity to 15 minutes of walking per day and continues to eat a low-sodium diet.  He is in the process of being reevaluated for sleep apnea.  On 07/26/2019 he repeated his sleep study and we are waiting on results from Dr. Edwena Felty office.  I will increase his metoprolol to 37.5 twice daily, send him a scale, send salty 6 and have him follow-up in 1 month.  Today he denies chest pain, shortness of breath, lower extremity edema, fatigue, palpitations, melena, hematuria, hemoptysis, diaphoresis, weakness, presyncope, syncope, orthopnea, and PND.   Prior CV studies:   The following studies were reviewed today:  EKG 03/19/2019 Sinus rhythm 71 bpm  Echocardiogram 03/18/19: IMPRESSIONS   1. Left ventricular ejection fraction, by visual estimation, is 60 to 65%. The left ventricle has normal function. There is moderately increased left ventricular hypertrophy of the basal septum. 2. There is akinesis of the basal inferior wall. 3. Left ventricular diastolic parameters are consistent with Grade I diastolic dysfunction (impaired relaxation). 4. The right ventricular size is not well visualized. No increase in right ventricular wall thickness. 5. Left atrial size was normal. 6. Right atrial size was normal. 7. The mitral valve is normal in structure. Mild mitral valve regurgitation. No evidence of mitral stenosis. 8. The tricuspid valve  is normal in structure. Tricuspid valve regurgitation is not demonstrated. 9. The aortic valve is normal in structure. Aortic valve regurgitation is mild. Mild aortic valve sclerosis without stenosis. 10. The pulmonic valve was normal in structure. Pulmonic valve regurgitation is trivial. 11. The inferior vena cava is normal in size with greater than 50% respiratory variability, suggesting right atrial pressure of 3 mmHg. 12. TR signal is inadequate for assessing pulmonary artery systolic pressure.  Past Medical History:  Diagnosis Date  . Disturbances of sulphur-bearing amino-acid metabolism   . Malignant neoplasm of prostate ([place])   . Obstructive sleep apnea (adult) (pediatric) 2004   surgery to coorrect  . Osteoarthrosis, unspecified whether generalized or localized, unspecified site   . Other and unspecified hyperlipidemia   . Other specified cardiac dysrhythmias(427.89)   . 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 Paroxysmal atrial fibrillation (HCC)   . Unspecified cerebral artery occlusion with cerebral infarction   . Unspecified disorder resulting from impaired renal function   . Unspecified essential hypertension   . Wears glasses   . Wears partial dentures    upper partial   Past Surgical History:  Procedure Laterality Date  . APPLICATION  OF A-CELL OF EXTREMITY Right 08/12/2013   Procedure: APPLICATION OF A-CELL OF EXTREMITY;  Surgeon: Theodoro Kos, DO;  Location: [place];  Service: Plastics;  Laterality: Right;  . CHOLECYSTECTOMY  1987  . COLONOSCOPY    . ent surgery  2004   correct snoring  . INGUINAL HERNIA REPAIR     left  . INSERT / REPLACE / REMOVE PACEMAKER  04/14/2013   dual chamber   / Dr Sallyanne Kuster  . MINOR IRRIGATION AND DEBRIDEMENT OF WOUND Right 03/04/2013   Procedure: MINOR IRRIGATION AND DEBRIDEMENT OF WOUND;  Surgeon: Theodoro Kos, DO;  Location: [place];  Service: Plastics;  Laterality: Right;  . ORCHIECTOMY   2002  . PERMANENT PACEMAKER INSERTION N/A 04/14/2013   Procedure: PERMANENT PACEMAKER INSERTION;  Surgeon: [name], MD;  Location: [place] CATH [facility];  Service: Cardiovascular;  Laterality: N/A;  . SKIN SPLIT GRAFT Right 08/12/2013   Procedure: SKIN GRAFT SPLIT THICKNESS WITH PLACEMENT OF VAC TO RIGHT LOWER LEG/PLACEMENT OF ACELL TO RIGHT UPPER THIGH AREA (HARVEST SITE);  Surgeon: Theodoro Kos, DO;  Location: [place];  Service: Plastics;  Laterality: Right;     Current Meds  Medication Sig  . albuterol (VENTOLIN HFA) 108 (90 Base) MCG/ACT inhaler Inhale 2 puffs into the lungs every 6 (six) hours as needed for wheezing or shortness of breath.  Marland Kitchen apixaban (ELIQUIS) 2.5 MG TABS tablet Take 1 tablet (2.5 mg total) by mouth 2 (two) times daily.  . Ascorbic Acid (VITAMIN C) 1000 MG tablet Take 1,000 mg by mouth daily.  Marland Kitchen aspirin EC 81 MG EC tablet Take 1 tablet (81 mg total) by mouth daily.  Marland Kitchen atorvastatin (LIPITOR) 40 MG tablet Take 1 tablet (40 mg total) by mouth daily at 6 PM.  . cholecalciferol (VITAMIN D3) 25 MCG (1000 UNIT) tablet Take 1,000 Units by mouth daily.  . metoprolol tartrate (LOPRESSOR) 25 MG tablet Take 1 tablet (25 mg total) by mouth 2 (two) times daily.  . Multiple Vitamin (MULTIVITAMIN WITH MINERALS) TABS tablet Take 1 tablet by mouth daily.  [name] 40 MG capsule Take 160 mg by mouth daily.      Allergies:   Patient has no known allergies.   Social History   Tobacco Use  . Smoking status: Current Some Day Smoker    Packs/day: 0.50    Years: 30.00    Pack years: 15.00    Types: Cigarettes  . Smokeless tobacco: Never Used  . Tobacco comment:  pt stated he smokes 1 pack every 2 wks  Substance Use Topics  . Alcohol use: No  . Drug use: No     Family Hx: The patient's family history includes Prostate cancer in his father.  ROS:   Please see the history of present illness.     All other systems reviewed and are negative.   Labs/Other Tests and  Data Reviewed:    Recent Labs: 03/18/2019: ALT 12; BUN 22; Creatinine, Ser 2.00; Hemoglobin 13.9; Platelets 184; Potassium 4.0; Sodium 146   Recent Lipid Panel Lab Results  Component Value Date/Time   CHOL 181 05/12/2019 09:45 AM   TRIG 206 (H) 05/12/2019 09:45 AM   TRIG 77 04/11/2006 09:06 AM   HDL 42 05/12/2019 09:45 AM   CHOLHDL 4.3 05/12/2019 09:45 AM   CHOLHDL 3.6 03/19/2019 07:15 AM   LDLCALC 103 (H) 05/12/2019 09:45 AM   LDLDIRECT 112.0 05/17/2017 08:55 AM    Wt Readings from Last 3 Encounters:  07/14/19 208 lb (94.3 kg)  06/02/19 208 lb (94.3 kg)  05/12/19 208 lb (94.3 kg)     Exam:    Vital Signs:  BP (!) 158/112   Pulse 79    Well nourished, well developed male in no  acute distress.   ASSESSMENT & PLAN:    1. Essential hypertension-BP today  158/112.    Has been averaging 150s over 80s at home. Has been taking medication as prescribed.  Increase metoprolol tartrate 37.5 mg twice daily Heart healthy low-sodium diet-salty 6 given Continue to ncrease physical activity as tolerated-15 minutes of mild to moderate physical activity daily. Daily weights-scale given.   Paroxysmal atrial fibrillation/sick sinus syndrome -PPM 04/2013.  Heart rate today 79. Continue Eliquis 2.5 mg twice daily Continue metoprolol 25 mg twice daily Heart healthy low-sodium diet Increase physical activity as tolerated  TIA/CVA -03/18/2019 presented to the emergency department after an a bout of slurred speech and right arm weakness.  Thought to be left brain TIA with source believed to be ICA siphon stenosis, left more than right.  MRI showed punctate acute cortical infarct versus artifact within the left superior frontal gyrus. Continue atorvastatin 40 mg daily Followed by neurology  CKD stage III-creatinine 2.00 on 03/18/2019. Monitored by PCP  Obstructive sleep apnea-repeated sleep study 07/26/2019.  Working with Dr. Brett Fairy on CPAP and CPAP supplies. Sleep hygiene sheet  provided  Disposition: Follow-up in 1 month.  COVID-19 Education: The signs and symptoms of COVID-19 were discussed with the patient and how to seek care for testing (follow up with PCP or arrange E-visit).  The importance of social distancing was discussed today.  Patient Risk:   After full review of this patients clinical status, I feel that they are at least moderate risk at this time.  Time:   Today, I have spent 15 minutes with the patient with telehealth technology discussing blood pressure, diet, exercise, medications.     Medication Adjustments/Labs and Tests Ordered: Current medicines are reviewed at length with the patient today.  Concerns regarding medicines are outlined above.   Tests Ordered: No orders of the defined types were placed in this encounter.  Medication Changes: No orders of the defined types were placed in this encounter.   Disposition:  in 1 month(s)  Signed,   Jossie Ng. [facility] [address] Office [phone] Fax [phone]

## 2019-07-28 ENCOUNTER — Telehealth (INDEPENDENT_AMBULATORY_CARE_PROVIDER_SITE_OTHER): Payer: Medicare HMO | Admitting: General Practice

## 2019-07-28 VITALS — BP 158/112 | HR 79

## 2019-07-28 DIAGNOSIS — I495 Sick sinus syndrome: Secondary | ICD-10-CM

## 2019-07-28 DIAGNOSIS — G459 Transient cerebral ischemic attack, unspecified: Secondary | ICD-10-CM

## 2019-07-28 DIAGNOSIS — I480 Paroxysmal atrial fibrillation: Secondary | ICD-10-CM | POA: Diagnosis not present

## 2019-07-28 DIAGNOSIS — E7800 Pure hypercholesterolemia, unspecified: Secondary | ICD-10-CM

## 2019-07-28 DIAGNOSIS — G4733 Obstructive sleep apnea (adult) (pediatric): Secondary | ICD-10-CM | POA: Diagnosis not present

## 2019-07-28 DIAGNOSIS — I129 Hypertensive chronic kidney disease with stage 1 through stage 4 chronic kidney disease, or unspecified chronic kidney disease: Secondary | ICD-10-CM | POA: Diagnosis not present

## 2019-07-28 DIAGNOSIS — N183 Chronic kidney disease, stage 3 unspecified: Secondary | ICD-10-CM | POA: Diagnosis not present

## 2019-07-28 DIAGNOSIS — Z950 Presence of cardiac pacemaker: Secondary | ICD-10-CM | POA: Diagnosis not present

## 2019-07-28 DIAGNOSIS — I10 Essential (primary) hypertension: Secondary | ICD-10-CM

## 2019-07-28 DIAGNOSIS — Z8673 Personal history of transient ischemic attack (TIA), and cerebral infarction without residual deficits: Secondary | ICD-10-CM

## 2019-07-28 NOTE — Telephone Encounter (Signed)
The patient's assistance has been denied due to "income is over the current eligibility criteria."

## 2019-07-28 NOTE — Patient Instructions (Signed)
Medication Instructions:  INCREASE METOPROLOL 37.5MG  TWICE DAILY  If you need a refill on your cardiac medications before your next appointment, please call your pharmacy.  Special Instructions: INCREASE PHYSICAL ACTIVITY AS TOLERATED  TAKE AND LOG WEIGHT DAILY   PLEASE READ AND FOLLOW SALTY 6 ATTACHED  Follow-Up: 08-28-2019 @ 940AM Virtual Visit  [name], MD.    At [facility], you and your health needs are our priority.  As part of our continuing mission to provide you with exceptional heart care, we have created designated Provider Care Teams.  These Care Teams include your primary Cardiologist (physician) and Advanced Practice Providers (APPs -  Physician Assistants and Nurse Practitioners) who all work together to provide you with the care you need, when you need it.  Reduce your risk of getting COVID-19 With your heart disease it is especially important for people at increased risk of severe illness from COVID-19, and those who live with them, to protect themselves from getting COVID-19. The best way to protect yourself and to help reduce the spread of the virus that causes COVID-19 is to: Marland Kitchen Limit your interactions with other people as much as possible. . Take COVID-19 when you do interact with others. If you start feeling sick and think you may have COVID-19, get in touch with your healthcare provider within 24 hours.  Thank you for choosing [facility] at [facility]!!

## 2019-07-31 ENCOUNTER — Other Ambulatory Visit: Payer: Self-pay

## 2019-08-03 ENCOUNTER — Ambulatory Visit (INDEPENDENT_AMBULATORY_CARE_PROVIDER_SITE_OTHER): Payer: Medicare HMO | Admitting: Family Medicine

## 2019-08-03 ENCOUNTER — Other Ambulatory Visit: Payer: Self-pay

## 2019-08-03 ENCOUNTER — Encounter: Payer: Self-pay | Admitting: Family Medicine

## 2019-08-03 VITALS — BP 134/72 | HR 84 | Temp 97.6°F | Ht 71.0 in | Wt 213.1 lb

## 2019-08-03 DIAGNOSIS — N183 Chronic kidney disease, stage 3 unspecified: Secondary | ICD-10-CM | POA: Diagnosis not present

## 2019-08-03 DIAGNOSIS — I10 Essential (primary) hypertension: Secondary | ICD-10-CM

## 2019-08-03 DIAGNOSIS — R5383 Other fatigue: Secondary | ICD-10-CM | POA: Diagnosis not present

## 2019-08-03 LAB — TSH: TSH: 1.77 u[IU]/mL (ref 0.35–4.50)

## 2019-08-03 LAB — BASIC METABOLIC PANEL
BUN: 25 mg/dL — ABNORMAL HIGH (ref 6–23)
CO2: 27 mEq/L (ref 19–32)
Calcium: 10 mg/dL (ref 8.4–10.5)
Chloride: 110 mEq/L (ref 96–112)
Creatinine, Ser: 1.92 mg/dL — ABNORMAL HIGH (ref 0.40–1.50)
GFR: 40.54 mL/min — ABNORMAL LOW (ref >60.00)
Glucose, Bld: 94 mg/dL (ref 70–99)
Potassium: 4.5 mEq/L (ref 3.5–5.1)
Sodium: 143 mEq/L (ref 135–145)

## 2019-08-03 NOTE — Patient Instructions (Signed)

## 2019-08-03 NOTE — Progress Notes (Signed)
Subjective:     Patient ID: [name], male   DOB: Apr 20, 1933, 86 y.o.   MRN: [id number]  HPI Mr. Kowalchuk has multiple chronic problems including history of ongoing nicotine use, history of cerebrovascular disease, atrial fibrillation, peripheral vascular disease, degenerative arthritis, chronic kidney disease, metastatic prostate cancer.  He had episode of elevated blood pressure recently and saw cardiology for that.  He states he had eaten a lot of salt the day before.  His blood pressures been stable since then.  His only current medications are metoprolol, Eliquis, atorvastatin, and Xtandi.  He is on lower dose Eliquis because of the fact that he is over 82 and has chronic kidney disease.  He denies any recent bleeding complications.  He has had some nonspecific fatigue issues.  He had recent sleep study and results are still pending.  He states especially early in the afternoons he has excessive daytime sleepiness.  He has had previous uvulopalatopharyngoplasty.  Denies any recent chest pains.  No dizziness.  No focal weakness.  No confusion.  Still smokes about a pack and half of cigarettes per day  Past Medical History:  Diagnosis Date  . Disturbances of sulphur-bearing amino-acid metabolism   . Malignant neoplasm of prostate ([place])   . Obstructive sleep apnea (adult) (pediatric) 2004   surgery to coorrect  . Osteoarthrosis, unspecified whether generalized or localized, unspecified site   . Other and unspecified hyperlipidemia   . Other specified cardiac dysrhythmias(427.89)   . 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 Paroxysmal atrial fibrillation (HCC)   . Unspecified cerebral artery occlusion with cerebral infarction   . Unspecified disorder resulting from impaired renal function   . Unspecified essential hypertension   . Wears glasses   . Wears partial dentures    upper partial   Past Surgical History:  Procedure Laterality Date  . APPLICATION OF A-CELL OF  EXTREMITY Right 08/12/2013   Procedure: APPLICATION OF A-CELL OF EXTREMITY;  Surgeon: Theodoro Kos, DO;  Location: [place];  Service: Plastics;  Laterality: Right;  . CHOLECYSTECTOMY  1987  . COLONOSCOPY    . ent surgery  2004   correct snoring  . INGUINAL HERNIA REPAIR     left  . INSERT / REPLACE / REMOVE PACEMAKER  04/14/2013   dual chamber   / Dr Sallyanne Kuster  . MINOR IRRIGATION AND DEBRIDEMENT OF WOUND Right 03/04/2013   Procedure: MINOR IRRIGATION AND DEBRIDEMENT OF WOUND;  Surgeon: Theodoro Kos, DO;  Location: [place];  Service: Plastics;  Laterality: Right;  . ORCHIECTOMY  2002  . PERMANENT PACEMAKER INSERTION N/A 04/14/2013   Procedure: PERMANENT PACEMAKER INSERTION;  Surgeon: [name], MD;  Location: [place] CATH LAB;  Service: Cardiovascular;  Laterality: N/A;  . SKIN SPLIT GRAFT Right 08/12/2013   Procedure: SKIN GRAFT SPLIT THICKNESS WITH PLACEMENT OF VAC TO RIGHT LOWER LEG/PLACEMENT OF ACELL TO RIGHT UPPER THIGH AREA (HARVEST SITE);  Surgeon: Theodoro Kos, DO;  Location: [place];  Service: Plastics;  Laterality: Right;    reports that he has been smoking cigarettes. He has a 15.00 pack-year smoking history. He has never used smokeless tobacco. He reports that he does not drink alcohol or use drugs. family history includes Prostate cancer in his father. No Known Allergies   Review of Systems  Constitutional: Negative for appetite change, fatigue and unexpected weight change.  Eyes: Negative for visual disturbance.  Respiratory: Negative for cough, chest tightness and shortness of breath.  Cardiovascular: Negative for chest pain, palpitations and leg swelling.  Endocrine: Negative for polydipsia and polyuria.  Musculoskeletal: Positive for arthralgias.  Neurological: Negative for dizziness, syncope, weakness, light-headedness and headaches.       Objective:   Physical Exam Vitals reviewed.  Constitutional:       Appearance: Normal appearance.  HENT:     Ears:     Comments: Minimal cerumen right canal.  Removed with curette    Mouth/Throat:     Comments: Previous surgery with uvula removal.  No lesions noted. Cardiovascular:     Rate and Rhythm: Normal rate and regular rhythm.  Pulmonary:     Comments: Slightly diminished breath sounds throughout but clear Musculoskeletal:     Right lower leg: No edema.     Left lower leg: No edema.  Neurological:     Mental Status: He is alert.        Assessment:      #1 hypertension history.  Currently stable here today  #2 dyslipidemia.  Goal LDL less than 70.  Patient treated with atorvastatin  #3 history of chronic atrial fibrillation treated with Eliquis and metoprolol  #4 general fatigue.  Likely multifactorial.  He is very sedentary.  Questionable sleep apnea history with recent sleep study pending  # 5 chronic kidney disease stage IIIb  #6 ongoing nicotine use.     Plan:     -Recheck basic metabolic panel and TSH -He is encouraged to try to step up his activity levels -Continue to monitor blood pressure and be in touch if consistently greater than 140/90  [name] MD [facility]

## 2019-08-04 NOTE — Addendum Note (Signed)
Addended by: [name] on: 08/04/2019 05:18 PM   Modules accepted: Orders

## 2019-08-04 NOTE — Procedures (Signed)
PATIENT'S NAME:  Christepher, Fronheiser DOB:      05/25/1932      MR#:    [id number]     DATE OF RECORDING: 07/26/2019 REFERRING M.D.:  Carolann Littler, MD Study Performed:   Baseline Polysomnogram HISTORY:  This 86 year- old African American male patient of the STROKE service was seen on 06/02/2019 for a sleep evaluation.  I have the pleasure of seeing TARUS ENTIN today, a left-handed African American male patient with a medical history of Disturbances of sulphur-bearing amino-acid metabolism, Malignant neoplasm of prostate ([place]), Obstructive sleep apnea (adult) (pediatric) (2004), Osteoarthrosis, unspecified whether generalized or localized, unspecified site, Other and unspecified hyperlipidemia, cardiac dysrhythmias(427.89), Pacemaker (04/14/2013), Paroxysmal atrial fibrillation ([place]), Unspecified cerebral artery occlusion with cerebral infarction, impaired renal function, essential hypertension.    Specific to our consultation today : he was hospitalized on11-08-2018 for a left brain stroke-TIA.  At the time he was 6 days short of his 30th birthday and had a history of atrial fibrillation with anticoagulation on Eliquis.  He had taken the medication the day of the symptoms. He also has a history of tachy-bradycardia with sick sinus syndrome with pacemaker implantation, has a history of hypertension, hyperlipidemia, diabetes prostate cancer and a history of UPPP.  The patient states that 20 years ago he was diagnosed with sleep apnea but could not like the idea of CPAP and preferred a surgical evaluation with ENT.   On 03-18-2019, He presented to the emergency room with sudden onset of the inability to speak and sustained right sided weakness that had lasted about an hour.  He was evaluated by Dr. Erlinda Hong and the stroke team.  It was found that he has a bilateral internal carotid artery siphon stenosis, left greater than right, and he did not receive IV TPA due to resolution of his symptoms.  CT was negative  for acute stroke, Significant atherosclerosis of the posterior communicating artery and right-sided vertebral artery stenosis.  However, no acute CVA. The patient also is followed by Dr. Sallyanne Kuster who had an 2D echo done revealing an EF of 60 to 65% and no cardiac embolism was identified.  The recommended blood pressure was adjusted for this patient to be between Q000111Q and Q000111Q systolic due to advanced atherosclerotic brain changes.  He had a last revisit with [name] here in the office at [facility] on 12 May 2019- since then had no new stroke symptoms.   Please note that he has a dual-chamber pacemaker implanted April 14, 2013, and that he had supposedly surgery to "correct obstructive sleep apnea" in the year 2001.      The patient endorsed the Epworth Sleepiness Scale at 15 points.   The patient's weight 207 pounds with a height of 71 (inches), resulting in a BMI of 29. kg/m2. The patient's neck circumference measured 16 inches.  CURRENT MEDICATIONS: Ventolin, Eliquis, Aspirin, Lipitor, Voltaren, Lopressor, Xtandi   PROCEDURE:  This is a multichannel digital polysomnogram utilizing the Somnostar 11.2 system.  Electrodes and sensors were applied and monitored per AASM Specifications.   EEG, EOG, Chin and Limb EMG, were sampled at 200 Hz.  ECG, Snore and Nasal Pressure, Thermal Airflow, Respiratory Effort, CPAP Flow and Pressure, Oximetry was sampled at 50 Hz. Digital video and audio were recorded.      BASELINE STUDY: Lights Out was at 22:47 and Lights On at 05:13.  Total recording time (TRT) was 386.5 minutes, with a total sleep time (TST) of 279 minutes.  The patient's sleep latency was 38 minutes.  REM latency was 131 minutes.  The sleep efficiency was 72.2 %.     SLEEP ARCHITECTURE: WASO (Wake after sleep onset) was 69 minutes.  There were 60 minutes in Stage N1, 54.5 minutes Stage N2, 99.5 minutes Stage N3 and 65 minutes in Stage REM.  The percentage of Stage N1  was 21.5%, Stage N2 was 19.5%, Stage N3 was 35.7% and Stage R (REM sleep) was 23.3%.   RESPIRATORY ANALYSIS:  There were a total of 183 respiratory events:  0 obstructive apneas, 0 central apneas and 72 mixed apneas with 111 hypopneas.      The total APNEA/HYPOPNEA INDEX (AHI) was 39.4/hour.  19 events occurred in REM sleep and 256 events in NREM. The REM AHI was 17.5 /hour, versus a non-REM AHI of 46.0/h. The patient spent 151 minutes of total sleep time in the supine position and 128 minutes in non-supine. The supine AHI was 56.4 versus a non-supine AHI of 19.3/h.  OXYGEN SATURATION & C02:  The Wake baseline 02 saturation was 94%, with the lowest being 80%. Time spent below 89% saturation equaled 4 minutes. The arousals were noted as: 33 were spontaneous, 7 were associated with PLMs, 111 were associated with respiratory events. The patient had a total of 10 Periodic Limb Movements.  The Periodic Limb Movement (PLM) index was 2.2 and the PLM Arousal index was 1.5/hour. Snoring was noted. EKG in paced rhythm.   IMPRESSION:  1. Mixed Sleep Apnea (MSA), severe degree with an AHI of A999333, with cyclic breathing pattern- see attached screen shots. This is much more severe in NREM than in REM sleep and qualifies as Cheyne-Stokes Respiration.  2. Mild Periodic Limb Movement Disorder (PLMD).   RECOMMENDATIONS: 1. Advise full night, attended, PAP titration study to optimize therapy. Cyclic breathing can convert to central apnea under CPAP and the patient is therefore not a candidate for auto-PAP titration.  The patient will be asked to avoid the supine sleep position.    I certify that I have reviewed the entire raw data recording prior to the issuance of this report in accordance with the Standards of Accreditation of the American Academy of Sleep Medicine (AASM)   [name], MD Diplomat, American Board of Psychiatry and Neurology  Diplomat, American Board of Sleep Medicine Market researcher,  [facility] [employer]

## 2019-08-04 NOTE — Progress Notes (Signed)
IMPRESSION:   1. Mixed Sleep Apnea (MSA), severe degree with an AHI of A999333,  with cyclic breathing pattern- see attached screen shots. This is  much more severe in NREM than in REM sleep and qualifies as  Cheyne-Stokes Respiration.  2. Mild Periodic Limb Movement Disorder (PLMD).    RECOMMENDATIONS:  1. Advise full night, attended, PAP titration study to optimize  therapy. Cyclic breathing can convert to central apnea under CPAP  and the patient is therefore not a candidate for auto-PAP  titration. The patient will be asked to avoid the supine sleep  position.

## 2019-08-11 ENCOUNTER — Telehealth: Payer: Self-pay

## 2019-08-11 NOTE — Telephone Encounter (Signed)
LVM for patient to call me back to discuss sleep study results.

## 2019-08-11 NOTE — Telephone Encounter (Signed)
-----   Message from Darleen Crocker, RN sent at 08/05/2019  7:05 AM EDT -----  ----- Message ----- From: [name], MD Sent: 08/04/2019   5:17 PM EDT To: [name], MD, Garvin Fila, MD, #  IMPRESSION:   1. Mixed Sleep Apnea (MSA), severe degree with an AHI of A999333,  with cyclic breathing pattern- see attached screen shots. This is  much more severe in NREM than in REM sleep and qualifies as  Cheyne-Stokes Respiration.  2. Mild Periodic Limb Movement Disorder (PLMD).    RECOMMENDATIONS:  1. Advise full night, attended, PAP titration study to optimize  therapy. Cyclic breathing can convert to central apnea under CPAP  and the patient is therefore not a candidate for auto-PAP  titration. The patient will be asked to avoid the supine sleep  position.

## 2019-08-13 ENCOUNTER — Encounter: Payer: Self-pay | Admitting: Neurology

## 2019-08-19 DIAGNOSIS — C61 Malignant neoplasm of prostate: Secondary | ICD-10-CM | POA: Diagnosis not present

## 2019-08-20 ENCOUNTER — Telehealth: Payer: Self-pay | Admitting: Family Medicine

## 2019-08-20 NOTE — Telephone Encounter (Signed)
Patient had a sleep study done 2 weeks ago and needs to give Dr Elease Hashimoto some information so the wife is requesting a call back.

## 2019-08-21 NOTE — Telephone Encounter (Signed)
Lvm for pt to schedule appt to discuss information

## 2019-08-21 NOTE — Telephone Encounter (Signed)
Pt is scheduled for April 13th

## 2019-08-25 ENCOUNTER — Encounter: Payer: Self-pay | Admitting: Family Medicine

## 2019-08-25 ENCOUNTER — Ambulatory Visit (INDEPENDENT_AMBULATORY_CARE_PROVIDER_SITE_OTHER): Payer: Medicare HMO | Admitting: Family Medicine

## 2019-08-25 ENCOUNTER — Other Ambulatory Visit: Payer: Self-pay

## 2019-08-25 VITALS — BP 164/98 | HR 86 | Temp 97.8°F | Wt 210.2 lb

## 2019-08-25 DIAGNOSIS — G4733 Obstructive sleep apnea (adult) (pediatric): Secondary | ICD-10-CM

## 2019-08-25 DIAGNOSIS — I480 Paroxysmal atrial fibrillation: Secondary | ICD-10-CM

## 2019-08-25 DIAGNOSIS — I10 Essential (primary) hypertension: Secondary | ICD-10-CM | POA: Diagnosis not present

## 2019-08-25 DIAGNOSIS — N183 Chronic kidney disease, stage 3 unspecified: Secondary | ICD-10-CM

## 2019-08-25 MED ORDER — AMLODIPINE BESYLATE 5 MG PO TABS: 5.0000 mg | ORAL_TABLET | Freq: Every day | ORAL | 5 refills | Status: DC

## 2019-08-25 NOTE — Progress Notes (Signed)
Subjective:     Patient ID: [name], male   DOB: Jul 07, 1932, 86 y.o.   MRN: [id number]  HPI   [name] is accompanied by wife today.  He has history of CVA, hypertension, atrial fibrillation, obstructive sleep apnea by recent sleep study, chronic peripheral neuropathy, dyslipidemia, obesity.  He is on chronic anticoagulation.  He had recent sleep study which confirmed obstructive apnea and is waiting for CPAP titration study.  He has hypertension which has recently been poorly controlled.  Has had multiple readings at home around 123456 systolic and low 123XX123 diastolic.  He had virtual follow-up with cardiology back in March and his metoprolol was increased to 37.5 mg twice daily.  Did not see much improvement in blood pressure since then.  He had hospitalization for TIA back in the fall and had amlodipine and losartan stopped at that time.  It was not clear why those were stopped during that admission.  Wife states he had gradual increase in blood pressure since then.  They try to watch sodium intake closely.  No alcohol use.  Past Medical History:  Diagnosis Date  . Disturbances of sulphur-bearing amino-acid metabolism   . Malignant neoplasm of prostate ([place])   . Obstructive sleep apnea (adult) (pediatric) 2004   surgery to coorrect  . Osteoarthrosis, unspecified whether generalized or localized, unspecified site   . Other and unspecified hyperlipidemia   . Other specified cardiac dysrhythmias(427.89)   . 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 Paroxysmal atrial fibrillation ([facility])   . Unspecified cerebral artery occlusion with cerebral infarction   . Unspecified disorder resulting from impaired renal function   . Unspecified essential hypertension   . Wears glasses   . Wears partial dentures    upper partial   Past Surgical History:  Procedure Laterality Date  . APPLICATION OF A-CELL OF EXTREMITY Right 08/12/2013   Procedure: APPLICATION OF A-CELL OF EXTREMITY;   Surgeon: Theodoro Kos, DO;  Location: [facility];  Service: Plastics;  Laterality: Right;  . CHOLECYSTECTOMY  1987  . COLONOSCOPY    . ent surgery  2004   correct snoring  . INGUINAL HERNIA REPAIR     left  . INSERT / REPLACE / REMOVE PACEMAKER  04/14/2013   dual chamber   / Dr Sallyanne Kuster  . MINOR IRRIGATION AND DEBRIDEMENT OF WOUND Right 03/04/2013   Procedure: MINOR IRRIGATION AND DEBRIDEMENT OF WOUND;  Surgeon: Theodoro Kos, DO;  Location: [place];  Service: Plastics;  Laterality: Right;  . ORCHIECTOMY  2002  . PERMANENT PACEMAKER INSERTION N/A 04/14/2013   Procedure: PERMANENT PACEMAKER INSERTION;  Surgeon: [name], MD;  Location: [facility] CATH [facility];  Service: Cardiovascular;  Laterality: N/A;  . SKIN SPLIT GRAFT Right 08/12/2013   Procedure: SKIN GRAFT SPLIT THICKNESS WITH PLACEMENT OF VAC TO RIGHT LOWER LEG/PLACEMENT OF ACELL TO RIGHT UPPER THIGH AREA (HARVEST SITE);  Surgeon: Theodoro Kos, DO;  Location: [facility];  Service: Plastics;  Laterality: Right;    reports that he has been smoking cigarettes. He has a 15.00 pack-year smoking history. He has never used smokeless tobacco. He reports that he does not drink alcohol or use drugs. family history includes Prostate cancer in his father. No Known Allergies   Review of Systems  Constitutional: Positive for fatigue. Negative for chills and fever.  Eyes: Negative for visual disturbance.  Respiratory: Negative for cough, chest tightness and shortness of breath.   Cardiovascular: Negative for chest pain, palpitations  and leg swelling.  Endocrine: Negative for polydipsia and polyuria.  Neurological: Negative for dizziness, syncope, weakness, light-headedness and headaches.       Objective:   Physical Exam Constitutional:      Appearance: He is well-developed.  Eyes:     Pupils: Pupils are equal, round, and reactive to light.  Neck:     Thyroid: No thyromegaly.  Cardiovascular:      Rate and Rhythm: Normal rate and regular rhythm.  Pulmonary:     Effort: Pulmonary effort is normal. No respiratory distress.     Breath sounds: Normal breath sounds. No wheezing or rales.  Musculoskeletal:     Cervical back: Neck supple.     Right lower leg: No edema.     Left lower leg: No edema.  Neurological:     Mental Status: He is alert and oriented to person, place, and time.        Assessment:     #1 hypertension poorly controlled.  Repeat blood pressure today left arm seated after rest 164/98.  Has had multiple elevated readings recently at home.  #2 obstructive sleep apnea by recent studies which were reviewed.  This may be contributing to poor control of #1  #3 history of chronic kidney disease stage III  #4 history of chronic atrial fibrillation maintained on metoprolol and Xarelto    Plan:     -We recommend close follow-up with neurology regarding his ongoing work-up for sleep apnea.  He has CPAP titration study pending.  Hopefully, if able to tolerate CPAP, may help with better BP control.    -We discussed adding back amlodipine 5 mg daily.  Watch for any edema or other side effects.  Continue close monitoring of home blood pressures.  -Office follow-up in 1 month to reassess.  Joseph Post MD [facility]

## 2019-08-26 DIAGNOSIS — E870 Hyperosmolality and hypernatremia: Secondary | ICD-10-CM | POA: Diagnosis not present

## 2019-08-26 DIAGNOSIS — C7951 Secondary malignant neoplasm of bone: Secondary | ICD-10-CM | POA: Diagnosis not present

## 2019-08-26 DIAGNOSIS — I509 Heart failure, unspecified: Secondary | ICD-10-CM | POA: Diagnosis not present

## 2019-08-26 DIAGNOSIS — R8279 Other abnormal findings on microbiological examination of urine: Secondary | ICD-10-CM | POA: Diagnosis not present

## 2019-08-26 DIAGNOSIS — C61 Malignant neoplasm of prostate: Secondary | ICD-10-CM | POA: Diagnosis not present

## 2019-08-28 ENCOUNTER — Telehealth (INDEPENDENT_AMBULATORY_CARE_PROVIDER_SITE_OTHER): Payer: Medicare HMO | Admitting: Cardiovascular Disease

## 2019-08-28 ENCOUNTER — Encounter: Payer: Self-pay | Admitting: Cardiovascular Disease

## 2019-08-28 ENCOUNTER — Telehealth: Payer: Self-pay | Admitting: *Deleted

## 2019-08-28 VITALS — BP 155/94 | HR 69 | Ht 71.0 in | Wt 204.0 lb

## 2019-08-28 DIAGNOSIS — I495 Sick sinus syndrome: Secondary | ICD-10-CM | POA: Diagnosis not present

## 2019-08-28 DIAGNOSIS — Z950 Presence of cardiac pacemaker: Secondary | ICD-10-CM | POA: Diagnosis not present

## 2019-08-28 DIAGNOSIS — G4733 Obstructive sleep apnea (adult) (pediatric): Secondary | ICD-10-CM | POA: Diagnosis not present

## 2019-08-28 DIAGNOSIS — I480 Paroxysmal atrial fibrillation: Secondary | ICD-10-CM

## 2019-08-28 DIAGNOSIS — C799 Secondary malignant neoplasm of unspecified site: Secondary | ICD-10-CM | POA: Diagnosis not present

## 2019-08-28 DIAGNOSIS — N1832 Chronic kidney disease, stage 3b: Secondary | ICD-10-CM

## 2019-08-28 DIAGNOSIS — Z7901 Long term (current) use of anticoagulants: Secondary | ICD-10-CM

## 2019-08-28 DIAGNOSIS — C61 Malignant neoplasm of prostate: Secondary | ICD-10-CM | POA: Diagnosis not present

## 2019-08-28 DIAGNOSIS — I10 Essential (primary) hypertension: Secondary | ICD-10-CM

## 2019-08-28 MED ORDER — METOPROLOL TARTRATE 50 MG PO TABS: 50.0000 mg | ORAL_TABLET | Freq: Two times a day (BID) | ORAL | 1 refills | Status: DC

## 2019-08-28 NOTE — Telephone Encounter (Signed)
The patient has been called about the virtual appointment today with Dr. Croitoru. Instructions provided. The AVS will be mailed. The patient verbalized their understanding.  

## 2019-08-28 NOTE — Progress Notes (Signed)
Cardiology Office Note    Date:  08/28/2019   ID:  [name], DOB [birth year], MRN [id number]  PCP:  Joseph Post, MD  Cardiologist:  [name], M.D.;  Joseph Klein, MD   No chief complaint on file.   History of Present Illness:  [name] is a 86 y.o. male with sinus node dysfunction and paroxysmal atrial fibrillation, dual-chamber permanent pacemaker.  His device was implanted in 2014 (MRI conditional Medtronic Advisa dual-chamber pacemaker) for tachycardia-bradycardia syndrome. Additional medical problems include hyperlipidemia, hypertension, obstructive sleep apnea, renal insufficiency, metastatic prostate cancer followed by Dr. Jeffie French.  Recently, his prostate cancer has returned with involvement of the pelvis and spine.  He does not have pain.  He is now on Xtandi.  He denies any problems with hematuria.  He has been complaining of fatigue and his dose of Xtandi was reduced.  He underwent a sleep study that showed numerous episodes of nocturnal apnea.  He is scheduled to go back for a CPAP titration study ordered by Dr. Brett French, this coming Monday.  He saw [name] last month and he had severely elevated systolic and diastolic blood pressure and amlodipine was added.  His blood pressure has improved, but his diastolic blood pressure is still often in the 90-95 range.  He has not had any dizziness, syncope or falls.  Today his diastolic blood pressure was 94, even though he rested for over 10 minutes and rechecked his blood pressure.  His most recent pacemaker download shows normal device function.  Estimated generator longevity is about 3.25 years.  He has virtually 100% atrial pacing with a good heart rate histogram distribution and infrequently requires ventricular pacing.  The overall burden of paroxysmal atrial fibrillation has been only 0.3%, with the longest recent episode being only 23 minutes long.  During atrial fibrillation here is very good  ventricular rate control, although he has brief episodes of atrial tachycardia with 1: 1 AV conduction.  The patient specifically denies any chest pain at rest exertion, dyspnea at rest or with exertion, orthopnea, paroxysmal nocturnal dyspnea, syncope, palpitations, focal neurological deficits, intermittent claudication, lower extremity edema, unexplained weight gain, cough, hemoptysis or wheezing. He has not had falls or bleeding problems Past Medical History:  Diagnosis Date  . Disturbances of sulphur-bearing amino-acid metabolism   . Malignant neoplasm of prostate ([place])   . Obstructive sleep apnea (adult) (pediatric) 2004   surgery to coorrect  . Osteoarthrosis, unspecified whether generalized or localized, unspecified site   . Other and unspecified hyperlipidemia   . Other specified cardiac dysrhythmias(427.89)   . 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 Paroxysmal atrial fibrillation ([facility])   . Unspecified cerebral artery occlusion with cerebral infarction   . Unspecified disorder resulting from impaired renal function   . Unspecified essential hypertension   . Wears glasses   . Wears partial dentures    upper partial    Past Surgical History:  Procedure Laterality Date  . APPLICATION OF A-CELL OF EXTREMITY Right 08/12/2013   Procedure: APPLICATION OF A-CELL OF EXTREMITY;  Surgeon: Theodoro Kos, DO;  Location: [place];  Service: Plastics;  Laterality: Right;  . CHOLECYSTECTOMY  1987  . COLONOSCOPY    . ent surgery  2004   correct snoring  . INGUINAL HERNIA REPAIR     left  . INSERT / REPLACE / REMOVE PACEMAKER  04/14/2013   dual chamber   / Dr Sallyanne Kuster  . MINOR IRRIGATION AND DEBRIDEMENT  OF WOUND Right 03/04/2013   Procedure: MINOR IRRIGATION AND DEBRIDEMENT OF WOUND;  Surgeon: Theodoro Kos, DO;  Location: [facility];  Service: Plastics;  Laterality: Right;  . ORCHIECTOMY  2002  . PERMANENT PACEMAKER INSERTION N/A 04/14/2013    Procedure: PERMANENT PACEMAKER INSERTION;  Surgeon: Joseph Klein, MD;  Location: [place] CATH [facility];  Service: Cardiovascular;  Laterality: N/A;  . SKIN SPLIT GRAFT Right 08/12/2013   Procedure: SKIN GRAFT SPLIT THICKNESS WITH PLACEMENT OF VAC TO RIGHT LOWER LEG/PLACEMENT OF ACELL TO RIGHT UPPER THIGH AREA (HARVEST SITE);  Surgeon: Theodoro Kos, DO;  Location: [place];  Service: Plastics;  Laterality: Right;    Current Medications: Outpatient Medications Prior to Visit  Medication Sig Dispense Refill  . albuterol (VENTOLIN HFA) 108 (90 Base) MCG/ACT inhaler Inhale 2 puffs into the lungs every 6 (six) hours as needed for wheezing or shortness of breath. 18 g 3  . amLODipine (NORVASC) 5 MG tablet Take 1 tablet (5 mg total) by mouth daily. 30 tablet 5  . apixaban (ELIQUIS) 2.5 MG TABS tablet Take 1 tablet (2.5 mg total) by mouth 2 (two) times daily. 180 tablet 3  . Ascorbic Acid (VITAMIN C) 1000 MG tablet Take 1,000 mg by mouth daily.    Marland Kitchen aspirin EC 81 MG EC tablet Take 1 tablet (81 mg total) by mouth daily. 30 tablet 0  . atorvastatin (LIPITOR) 40 MG tablet Take 1 tablet (40 mg total) by mouth daily at 6 PM. 90 tablet 1  . cholecalciferol (VITAMIN D3) 25 MCG (1000 UNIT) tablet Take 1,000 Units by mouth daily.    . metoprolol tartrate (LOPRESSOR) 25 MG tablet Take 1 tablet (25 mg total) by mouth 2 (two) times daily. (Patient taking differently: Take 37.5 mg by mouth 2 (two) times daily. Taking 1.5 tablets 2 times daily) 180 tablet 3  . Multiple Vitamin (MULTIVITAMIN WITH MINERALS) TABS tablet Take 1 tablet by mouth daily.    [name] 40 MG capsule Take 160 mg by mouth daily.      No facility-administered medications prior to visit.     Allergies:   Patient has no known allergies.   Social History   Socioeconomic History  . Marital status: Married    Spouse name: Consulting civil engineer  . Number of children: Not on file  . Years of education: Not on file  . Highest education level: Not  on file  Occupational History  . Occupation: Retired  . Occupation: RETIRED    Employer: RETIRED  Tobacco Use  . Smoking status: Current Some Day Smoker    Packs/day: 0.50    Years: 30.00    Pack years: 15.00    Types: Cigarettes  . Smokeless tobacco: Never Used  . Tobacco comment:  pt stated he smokes 1 pack every 2 wks  Substance and Sexual Activity  . Alcohol use: No  . Drug use: No  . Sexual activity: Not on file    Comment: started smoking 2-3 cig daily  Other Topics Concern  . Not on file  Social History Narrative  . Not on file   Social Determinants of Health   Financial Resource Strain:   . Difficulty of Paying Living Expenses:   Food Insecurity:   . Worried About Charity fundraiser in the Last Year:   . Arboriculturist in the Last Year:   Transportation Needs:   . Film/video editor (Medical):   Marland Kitchen Lack of Transportation (Non-Medical):  Physical Activity:   . Days of Exercise per Week:   . Minutes of Exercise per Session:   Stress:   . Feeling of Stress :   Social Connections:   . Frequency of Communication with Friends and Family:   . Frequency of Social Gatherings with Friends and Family:   . Attends Religious Services:   . Active Member of Clubs or Organizations:   . Attends Archivist Meetings:   Marland Kitchen Marital Status:      Family History:  The patient's family history includes Prostate cancer in his father.   ROS:   Please see the history of present illness.    ROS All other systems are reviewed and are negative.   PHYSICAL EXAM:   VS:  BP (!) 155/94   Pulse 69   Ht 5\' 11"  (1.803 m)   Wt 204 lb (92.5 kg)   BMI 28.45 kg/m    Vital signs reviewed.  Unable to examine.  Wt Readings from Last 3 Encounters:  08/28/19 204 lb (92.5 kg)  08/25/19 210 lb 3.2 oz (95.3 kg)  08/03/19 213 lb 1.6 oz (96.7 kg)      Studies/Labs Reviewed:   EKG: Reviewed ECG from March 18, 2019 which shows atrial paced, ventricular sensed rhythm with  nonspecific repolarization changes.  Recent Labs: 03/18/2019: ALT 12; Hemoglobin 13.9; Platelets 184 08/03/2019: BUN 25; Creatinine, Ser 1.92; Potassium 4.5; Sodium 143; TSH 1.77   Lipid Panel    Component Value Date/Time   CHOL 181 05/12/2019 0945   TRIG 206 (H) 05/12/2019 0945   TRIG 77 04/11/2006 0906   HDL 42 05/12/2019 0945   CHOLHDL 4.3 05/12/2019 0945   CHOLHDL 3.6 03/19/2019 0715   VLDL 21 03/19/2019 0715   LDLCALC 103 (H) 05/12/2019 0945   LDLDIRECT 112.0 05/17/2017 0855     ASSESSMENT:    No diagnosis found.   PLAN:  In order of problems listed above:  1. AFib: No bleeding problems on Eliquis.  He does have a history of stroke.  (CHADSVasc 5: age 20, stroke 2, hypertension).  The overall burden of arrhythmia is low and he does not have RVR during atrial fibrillation. 2. Anticoagulation: No bleeding complications.  Dose adjusted for age and renal function. 3. HTN: Increase metoprolol to 50 mg twice daily.  Target blood pressure 130/80. 4. SSS: Appropriate heart rate histogram distribution based on current sensor settings.  Virtually 100% atrial pacing. 5. PPM: Continue remote downloads every 3 months. 6. OSA: Suspect that his fatigue may actually be due to untreated sleep apnea rather than a cardiac cause or the treatment for his prostate cancer.  Encouraged him to proceed with his CPAP titration test on Monday and to give his best effort to adjust to chronic treatment with CPAP. 7. Metastatic prostate cancer: Not symptomatic, good quality of life. 8. CKD 3: Creatinine stable around 2.0, GFR around 40. Most recent creatinine in March was actually little better at 1.92.  Medication Adjustments/Labs and Tests Ordered: Current medicines are reviewed at length with the patient today.  Concerns regarding medicines are outlined above.  Medication changes, Labs and Tests ordered today are listed in the Patient Instructions below. There are no Patient Instructions on file for  this visit.   Signed, Joseph Klein, MD  08/28/2019 9:38 AM    [facility] [place], [place], [place]  [address] Phone: [phone]; Fax: [phone]

## 2019-08-28 NOTE — Patient Instructions (Addendum)
Medication Instructions:  INCREASE the Metoprolol Tartrate to 50 mg twice daily  *If you need a refill on your cardiac medications before your next appointment, please call your pharmacy*   Lab Work: None ordered If you have labs (blood work) drawn today and your tests are completely normal, you will receive your results only by: Marland Kitchen MyChart Message (if you have MyChart) OR . A paper copy in the mail If you have any lab test that is abnormal or we need to change your treatment, we will call you to review the results.   Testing/Procedures: None ordered   Follow-Up: At [facility], you and your health needs are our priority.  As part of our continuing mission to provide you with exceptional heart care, we have created designated Provider Care Teams.  These Care Teams include your primary Cardiologist (physician) and Advanced Practice Providers (APPs -  Physician Assistants and Nurse Practitioners) who all work together to provide you with the care you need, when you need it.  We recommend signing up for the patient portal called "MyChart".  Sign up information is provided on this After Visit Summary.  MyChart is used to connect with patients for Virtual Visits (Telemedicine).  Patients are able to view lab/test results, encounter notes, upcoming appointments, etc.  Non-urgent messages can be sent to your provider as well.   To learn more about what you can do with MyChart, go to [email or web address].    Your next appointment:   3 month(s)  The format for your next appointment:   In Person  Provider:   You may see [name], MD or one of the following Advanced Practice Providers on your designated Care Team:    Coletta Memos, NP    Other Instructions Dr. Sallyanne Kuster would like you to check your blood pressure daily for the next 2 weeks.  Keep a journal of these daily blood pressure and heart rate readings and call our office or send a message through [place] with the results.  Thank you!

## 2019-08-28 NOTE — Telephone Encounter (Signed)
  Patient Consent for Virtual Visit         [name] has provided verbal consent on 08/28/2019 for a virtual visit (video or telephone).   CONSENT FOR VIRTUAL VISIT FOR:  [name]  By participating in this virtual visit I agree to the following:  I hereby voluntarily request, consent and authorize [facility] and its employed or contracted physicians, physician assistants, nurse practitioners or other licensed health care professionals (the Practitioner), to provide me with telemedicine health care services (the "Services") as deemed necessary by the treating Practitioner. I acknowledge and consent to receive the Services by the Practitioner via telemedicine. I understand that the telemedicine visit will involve communicating with the Practitioner through live audiovisual communication technology and the disclosure of certain medical information by electronic transmission. I acknowledge that I have been given the opportunity to request an in-person assessment or other available alternative prior to the telemedicine visit and am voluntarily participating in the telemedicine visit.  I understand that I have the right to withhold or withdraw my consent to the use of telemedicine in the course of my care at any time, without affecting my right to future care or treatment, and that the Practitioner or I may terminate the telemedicine visit at any time. I understand that I have the right to inspect all information obtained and/or recorded in the course of the telemedicine visit and may receive copies of available information for a reasonable fee.  I understand that some of the potential risks of receiving the Services via telemedicine include:  Marland Kitchen Delay or interruption in medical evaluation due to technological equipment failure or disruption; . Information transmitted may not be sufficient (e.g. poor resolution of images) to allow for appropriate medical decision making by the  Practitioner; and/or  . In rare instances, security protocols could fail, causing a breach of personal health information.  Furthermore, I acknowledge that it is my responsibility to provide information about my medical history, conditions and care that is complete and accurate to the best of my ability. I acknowledge that Practitioner's advice, recommendations, and/or decision may be based on factors not within their control, such as incomplete or inaccurate data provided by me or distortions of diagnostic images or specimens that may result from electronic transmissions. I understand that the practice of medicine is not an exact science and that Practitioner makes no warranties or guarantees regarding treatment outcomes. I acknowledge that a copy of this consent can be made available to me via my patient portal ([place]), or I can request a printed copy by calling the office of [facility] [place].    I understand that my insurance will be billed for this visit.   I have read or had this consent read to me. . I understand the contents of this consent, which adequately explains the benefits and risks of the Services being provided via telemedicine.  . I have been provided ample opportunity to ask questions regarding this consent and the Services and have had my questions answered to my satisfaction. . I give my informed consent for the services to be provided through the use of telemedicine in my medical care

## 2019-08-29 ENCOUNTER — Other Ambulatory Visit (HOSPITAL_COMMUNITY)
Admission: RE | Admit: 2019-08-29 | Discharge: 2019-08-29 | Disposition: A | Discharge status: Home / Self Care | Payer: Medicare HMO | Source: Ambulatory Visit | Attending: Neurology | Admitting: Neurology

## 2019-08-29 DIAGNOSIS — Z20822 Contact with and (suspected) exposure to covid-19: Secondary | ICD-10-CM | POA: Diagnosis not present

## 2019-08-29 DIAGNOSIS — Z01812 Encounter for preprocedural laboratory examination: Secondary | ICD-10-CM | POA: Insufficient documentation

## 2019-08-29 LAB — SARS CORONAVIRUS 2 (TAT 6-24 HRS): SARS Coronavirus 2: NEGATIVE

## 2019-08-31 ENCOUNTER — Ambulatory Visit (INDEPENDENT_AMBULATORY_CARE_PROVIDER_SITE_OTHER): Payer: Medicare HMO | Admitting: Neurology

## 2019-08-31 DIAGNOSIS — G4733 Obstructive sleep apnea (adult) (pediatric): Secondary | ICD-10-CM | POA: Diagnosis not present

## 2019-08-31 DIAGNOSIS — G4719 Other hypersomnia: Secondary | ICD-10-CM

## 2019-08-31 DIAGNOSIS — I480 Paroxysmal atrial fibrillation: Secondary | ICD-10-CM

## 2019-08-31 DIAGNOSIS — R063 Periodic breathing: Secondary | ICD-10-CM

## 2019-08-31 DIAGNOSIS — G459 Transient cerebral ischemic attack, unspecified: Secondary | ICD-10-CM

## 2019-08-31 DIAGNOSIS — I6523 Occlusion and stenosis of bilateral carotid arteries: Secondary | ICD-10-CM

## 2019-09-07 ENCOUNTER — Telehealth: Payer: Self-pay | Admitting: Family Medicine

## 2019-09-07 NOTE — Telephone Encounter (Signed)
Received a call from the patient. I thought he was calling to get results from the first sleep study. I had not called him. Pt has already had the titration study on 19 th. Dr Brett Fairy has not reviewed those results yet.  I advised the patient I will call him once I have those results in.

## 2019-09-07 NOTE — Progress Notes (Signed)
  Chronic Care Management   Note  09/07/2019 Name: [name] MRN: [id number] DOB: 05/11/1933  [name] is a 86 y.o. year old male who is a primary care patient of Burchette, Alinda Sierras, MD. I reached out to [name] by phone today in response to a referral sent by [name]'s PCP, [name], MD.   [name] was given information about Chronic Care Management services today including:  1. CCM service includes personalized support from designated clinical staff supervised by his physician, including individualized plan of care and coordination with other care providers 2. 24/7 contact phone numbers for assistance for urgent and routine care needs. 3. Service will only be billed when office clinical staff spend 20 minutes or more in a month to coordinate care. 4. Only one practitioner may furnish and bill the service in a calendar month. 5. The patient may stop CCM services at any time (effective at the end of the month) by phone call to the office staff.   Patient agreed to services and verbal consent obtained.   Follow up plan:   [place]

## 2019-09-08 DIAGNOSIS — R063 Periodic breathing: Secondary | ICD-10-CM | POA: Insufficient documentation

## 2019-09-08 NOTE — Procedures (Signed)
PATIENT'S NAME:  [name], [name] DOB:      09-Dec-1932      MR#:    [id number]     DATE OF RECORDING: 08/31/2019 [place] REFERRING M.D.:  Stroke service, MD and PCP Dr. Carolann Littler, MD Study Performed:   Titration to positive airway pressure (PAP)  HISTORY:  [name] , an 86 year- old patient of the Stroke Service, returns for PAP titration following PSG from 07/26/19 which documented a Mixed type of sleep apnea with an AHI of 39.4 with NREM dominant apnea, and o2 nadir of 80%. The patient has a tobacco use history, has atrial fibrillation and had a CVA in 2020.   The patient endorsed the Epworth Sleepiness Scale at 15 points and the Fatigue Score at 39- points.    The patient's weight 207 pounds with a height of 71 (inches), resulting in a BMI of 29.1 kg/m2.  The patient's neck circumference measured 16 inches.  CURRENT MEDICATIONS: Ventolin, Eliquis, Aspirin, Lipitor, Voltaren, Lopressor, Xtandi   PROCEDURE:  This is a multichannel digital polysomnogram utilizing the SomnoStar 11.2 system.  Electrodes and sensors were applied and monitored per AASM Specifications.   EEG, EOG, Chin and Limb EMG, were sampled at 200 Hz.  ECG, Snore and Nasal Pressure, Thermal Airflow, Respiratory Effort, CPAP Flow and Pressure, Oximetry was sampled at 50 Hz. Digital video and audio were recorded.      CPAP was initiated at 5 cmH20 with heated humidity per AASM split night standards and pressure was advanced to 6 cmH20 when [employer] became evident. The technician changed to BiPAP at 9/5 cm water and advanced to ST function at 16/min because of hypopneas, apneas and desaturations. Finally changing to ASV, the patient was able to sleep 192 minutes with an AHI of only 0.9/h under 15 maximal pressure support, 4 minimal pressure support and 4 cm EEP.    At a PAP pressure of 15/6/4 cmH20, there was a reduction of the AHI to 0.9 with improvement of sleep apnea.  Lights Out was at 20:44 and Lights On  at 05:00. Total recording time (TRT) was 496.5 minutes, with a total sleep time (TST) of 340 minutes. The patient's sleep latency was 184 minutes. REM latency was 34 minutes.  The sleep efficiency was 68.5 %.    SLEEP ARCHITECTURE: WASO (Wake after sleep onset) was 127 minutes.  There were 43.5 minutes in Stage N1, 98.5 minutes Stage N2, 169 minutes Stage N3 and 29 minutes in Stage REM.  The percentage of Stage N1 was 12.8%, Stage N2 was 29.%, Stage N3 was 49.7% and Stage R (REM sleep) was 8.5%.   RESPIRATORY ANALYSIS:  There was a total of 38 respiratory events: 0 obstructive apneas, 36 central apneas and 2 mixed apneas with a total of 38 apneas and an apnea index (AI) of 6.7 /hour. There were 0 hypopneas with a hypopnea index of 0/hour.   The total APNEA/HYPOPNEA INDEX  (AHI) was 6.7 /hour.1 event occurred in REM sleep and 37 events in NREM. The REM AHI was 2.1 /hour versus a non-REM AHI of 7.1 /hour.  The patient spent 225 minutes of total sleep time in the supine position and 115 minutes in non-supine. The supine AHI was 4.0, versus a non-supine AHI of 12.0.  OXYGEN SATURATION & C02:  The baseline 02 saturation was 96%, with the lowest being 89%. Time spent below 89% saturation equaled 0 minutes.  The arousals were noted as: 90 were spontaneous, 0 were associated with  PLMs, 28 were associated with respiratory events. The patient had a total of 0 Periodic Limb Movements.  Audio and video analysis did not show any abnormal or unusual movements, behaviors, phonations or vocalizations. Sleep was extremely fragmented until ASV was used, finally allowing over 80% sleep efficiency.   EKG was in keeping with normal sinus rhythm (NSR).   DIAGNOSIS 1. 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 Finally, under ASV of 15/6/ over 4 cm water, there was a remarkable reduction in AHI.  2. The patient was fitted with a ResMed AirFit F20 medium Full face  mask.  PLANS/RECOMMENDATIONS: 1.  ASV of 15/6/ over 4 cm water and a ResMed AirFit F20 medium Full face mask.  2. Any apnea patient should avoid sedatives, hypnotics, and alcohol consumption. 3. PAP therapy compliance is defined as 4 hours or more of nightly use.    DISCUSSION: RV with NP, and regular stroke service follow up    A follow up appointment will be scheduled in the Sleep Clinic at [facility].   Please call [phone] with any questions.      I certify that I have reviewed the entire raw data recording prior to the issuance of this report in accordance with the Standards of Accreditation of the American Academy of Sleep Medicine (AASM)  [name], M.D. Diplomat, Tax adviser of Psychiatry and Neurology  Diplomat, Tax adviser of Sleep Medicine Market researcher, [employer] [facility] at [employer]

## 2019-09-08 NOTE — Progress Notes (Signed)
  DIAGNOSIS: Cheyne Stokes Respiration 1. 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 Finally, under ASV of 15/6/ over 4 cm water, there was a remarkable reduction in AHI.  2. The patient was fitted with a ResMed AirFit F20 medium Full face mask.  PLANS/RECOMMENDATIONS: 1.  ASV of 15/6/ over 4 cm water and a ResMed AirFit F20 medium Full face mask.  2. Any apnea patient should avoid sedatives, hypnotics, and alcohol consumption. 3. PAP therapy compliance is defined as 4 hours or more of nightly use.    DISCUSSION: RV with NP, and regular stroke service follow up

## 2019-09-08 NOTE — Addendum Note (Signed)
Addended by: [name] on: 09/08/2019 05:44 PM   Modules accepted: Orders

## 2019-09-09 ENCOUNTER — Telehealth: Payer: Self-pay | Admitting: Neurology

## 2019-09-09 NOTE — Telephone Encounter (Signed)
-----   Message from [name], MD sent at 09/08/2019  5:44 PM EDT -----  DIAGNOSIS: [name] Respiration 1. 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 Finally, under ASV of 15/6/ over 4 cm water, there was a remarkable reduction in AHI.  2. The patient was fitted with a ResMed AirFit F20 medium Full face mask.  PLANS/RECOMMENDATIONS: 1.  ASV of 15/6/ over 4 cm water and a ResMed AirFit F20 medium Full face mask.  2. Any apnea patient should avoid sedatives, hypnotics, and alcohol consumption. 3. PAP therapy compliance is defined as 4 hours or more of nightly use.    DISCUSSION: RV with NP, and regular stroke service follow up

## 2019-09-09 NOTE — Telephone Encounter (Signed)
I called [name]. I advised [name] that Dr. Brett Fairy reviewed their sleep study results and found that [name] tolerated and was treated with ASV machine in his sleep study. Dr. Brett Fairy recommends that [name] starts ASV. I reviewed PAP compliance expectations with the [name]. [name] is agreeable to starting a CPAP. I advised [name] that an order will be sent to a DME, [facility], and [facility] will call the [name] within about one week after they file with the [name]'s insurance. [facility] will show the [name] how to use the machine, fit for masks, and troubleshoot the CPAP if needed. A follow up appt was made for insurance purposes with [name] on July 19,2021 at 10:15 am. [name] verbalized understanding to arrive 15 minutes early and bring their CPAP. A letter with all of this information in it will be mailed to the [name] as a reminder. I verified with the [name] that the address we have on file is correct. [name] verbalized understanding of results. [name] had no questions at this time but was encouraged to call back if questions arise. I have sent the order to [facility] and have received confirmation that they have received the order.

## 2019-09-10 ENCOUNTER — Telehealth: Payer: Self-pay | Admitting: Family Medicine

## 2019-09-10 ENCOUNTER — Ambulatory Visit: Payer: Medicare HMO | Admitting: Adult Health

## 2019-09-10 NOTE — Progress Notes (Deleted)
[facility] [address] [place]. [place] [address] [phone]       STROKE FOLLOW UP NOTE  Mr. ZYLEN HOGANSON Date of Birth:  [birth year] Medical Record Number:  [id number]   Reason for Referral: stroke follow up    CHIEF COMPLAINT:  No chief complaint on file.   HPI:   Today, 09/10/2019, Mr. Forrister returns for follow-up regarding TIA in 03/2019.  He has been stable since prior visit without reoccurring stroke/TIA symptoms.  Continues on Eliquis 2.5 mg twice daily and aspirin 81 mg daily for secondary stroke prevention and history of atrial fibrillation.  Continues on atorvastatin 40 mg daily without myalgias.  Blood pressure today ***.  Concern for underlying sleep apnea at prior visit and referred to [facility] with evaluation by Dr. Brett Fairy on 06/02/2019 and sleep study on 07/26/2019 which showed mixed sleep apnea, severe degree with cyclic breathing pattern qualifying as Cheyne-Stokes respiration and mild PLMD.  Underwent CPAP titration on 08/31/2019 with central sleep apnea failing to respond to CPAP, BiPAP and BiPAP with ST function entirely following remarkable reduction of AHI under ASV.  Received results yesterday and currently awaiting insurance approval to initiate treatment.  No further concerns at this time.    History provided for reference purposes only Initial visit 05/12/2019: Mr. Tomlins is a 86 year old male who is being seen today for hospital follow-up.  He has been doing well since discharge without any reoccurring or new stroke/TIA symptoms.  He continues to live independently with his wife maintaining all ADLs and IADLs without difficulty including driving.  Continues on Eliquis 2.5 mg twice daily and aspirin 81 mg daily without bleeding or bruising.  He is currently on simvastatin 40 mg daily and denies myalgias.  He denies being placed on atorvastatin at hospital discharge as recommended by neurology team.  He has not had any repeat  levels at this time.  Blood pressure today satisfactory 138/88.  He does endorse sleeping for long period of time throughout the night since his TIA previously going to bed between 11:30 PM-12AM and will awaken around 6-7AM but now has been sleeping until 8-9AM and has difficulty getting out of bed.  He does endorse sleeping well throughout the night without any concerns of nocturia or insomnia.  He does admit to napping frequently throughout the day.  He does endorse prior history of sleep evaluation and underwent "palate surgery" but he is unable to verify exact year or reason for procedure.  He continues to smoke tobacco approximately 3/4 pack/day and has attempted to decrease but is concerned regarding increasing fluid intake at that time.  No further concerns at this time.  Stroke admission 03/18/2019: Mr. HJALMER DANOWSKI is a 86 y.o. male with history of atrial fibrillation on Eliquis last dose taken at 9 AM 11/4, tachybradycardia syndrome status post pacemaker, hypertension, hyperlipidemia, diabetes, prostate cancer, who presented on 03/18/2019 with sudden onset of inability to talk and right-sided weakness x 1 hr.  Evaluated by Dr. 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  He did not receive IV tPA due to resolution of symptoms.  CT head negative for acute abnormality with evidence of advanced small vessel disease, atrophy and multiple old and questionable age-related bilateral basal ganglia and thalamic lacunes.  CTA head showed significant arthrosclerosis with moderate/severe paraclinoid left ICA, moderate paraclinoid right ICA, focal stenosis bilateral M1, and fetal origin  left PCA with significant arthrosclerosis PCom.  CTA neck significant right VA stenosis.  MRI no acute infarct.  2D echo showed an EF of 60 to 65% without cardiac source of embolus identified.  As per Dr. Sallyanne Kuster, pacemaker  interrogation showed very low AF burden.  Previously on Eliquis and recommended continuation of Eliquis in addition to aspirin 81 mg daily.  Due to intracranial ICA stenosis, recommended blood pressure goal 130-150 with avoidance of low BP and adding aspirin 81 mg daily on top of Eliquis.  History of HTN stable.  LDL 100 and recommended switching statin from simvastatin to atorvastatin 40 mg daily.  A1c 6.1 without prior history of DM.  Current tobacco use with smoking cessation counseling provided.  Other stroke risk factors include advanced age and tachybradycardia status post pacer placement in 2014 followed by cardiology and prior stroke on imaging.  Other active problems include CKD stage III, history of prostate cancer and 2.2 cm left thyroid nodule and recommended ultrasound follow-up.       ROS:   14 system review of systems performed and negative with exception of wheezing, urination problems, joint pain, joint swelling, runny nose, memory loss, weakness, too much sleep and decreased energy  PMH:  Past Medical History:  Diagnosis Date  . Disturbances of sulphur-bearing amino-acid metabolism   . Malignant neoplasm of prostate ([place])   . Obstructive sleep apnea (adult) (pediatric) 2004   surgery to coorrect  . Osteoarthrosis, unspecified whether generalized or localized, unspecified site   . Other and unspecified hyperlipidemia   . Other specified cardiac dysrhythmias(427.89)   . 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 Paroxysmal atrial fibrillation (HCC)   . Unspecified cerebral artery occlusion with cerebral infarction   . Unspecified disorder resulting from impaired renal function   . Unspecified essential hypertension   . Wears glasses   . Wears partial dentures    upper partial    PSH:  Past Surgical History:  Procedure Laterality Date  . APPLICATION OF A-CELL OF EXTREMITY Right 08/12/2013   Procedure: APPLICATION OF A-CELL OF EXTREMITY;  Surgeon: Theodoro Kos,  DO;  Location: [place] [facility];  Service: Plastics;  Laterality: Right;  . CHOLECYSTECTOMY  1987  . COLONOSCOPY    . ent surgery  2004   correct snoring  . INGUINAL HERNIA REPAIR     left  . INSERT / REPLACE / REMOVE PACEMAKER  04/14/2013   dual chamber   / Dr Sallyanne Kuster  . MINOR IRRIGATION AND DEBRIDEMENT OF WOUND Right 03/04/2013   Procedure: MINOR IRRIGATION AND DEBRIDEMENT OF WOUND;  Surgeon: Theodoro Kos, DO;  Location: [facility];  Service: Plastics;  Laterality: Right;  . ORCHIECTOMY  2002  . PERMANENT PACEMAKER INSERTION N/A 04/14/2013   Procedure: PERMANENT PACEMAKER INSERTION;  Surgeon: [name], MD;  Location: [facility] CATH [facility];  Service: Cardiovascular;  Laterality: N/A;  . SKIN SPLIT GRAFT Right 08/12/2013   Procedure: SKIN GRAFT SPLIT THICKNESS WITH PLACEMENT OF VAC TO RIGHT LOWER LEG/PLACEMENT OF ACELL TO RIGHT UPPER THIGH AREA (HARVEST SITE);  Surgeon: Theodoro Kos, DO;  Location: [facility];  Service: Plastics;  Laterality: Right;    Social History:  Social History   Socioeconomic History  . Marital status: Married    Spouse name: Consulting civil engineer  . Number of children: Not on file  . Years of education: Not on file  . Highest education level: Not on file  Occupational History  . Occupation: Retired  .  Occupation: RETIRED    Employer: RETIRED  Tobacco Use  . Smoking status: Current Some Day Smoker    Packs/day: 0.50    Years: 30.00    Pack years: 15.00    Types: Cigarettes  . Smokeless tobacco: Never Used  . Tobacco comment:  pt stated he smokes 1 pack every 2 wks  Substance and Sexual Activity  . Alcohol use: No  . Drug use: No  . Sexual activity: Not on file    Comment: started smoking 2-3 cig daily  Other Topics Concern  . Not on file  Social History Narrative  . Not on file   Social Determinants of Health   Financial Resource Strain:   . Difficulty of Paying Living Expenses:   Food Insecurity:   . Worried  About Charity fundraiser in the Last Year:   . Arboriculturist in the Last Year:   Transportation Needs:   . Film/video editor (Medical):   Marland Kitchen Lack of Transportation (Non-Medical):   Physical Activity:   . Days of Exercise per Week:   . Minutes of Exercise per Session:   Stress:   . Feeling of Stress :   Social Connections:   . Frequency of Communication with Friends and Family:   . Frequency of Social Gatherings with Friends and Family:   . Attends Religious Services:   . Active Member of Clubs or Organizations:   . Attends Archivist Meetings:   Marland Kitchen Marital Status:   Intimate Partner Violence:   . Fear of Current or Ex-Partner:   . Emotionally Abused:   Marland Kitchen Physically Abused:   . Sexually Abused:     Family History:  Family History  Problem Relation Age of Onset  . Prostate cancer Father     Medications:   Current Outpatient Medications on File Prior to Visit  Medication Sig Dispense Refill  . albuterol (VENTOLIN HFA) 108 (90 Base) MCG/ACT inhaler Inhale 2 puffs into the lungs every 6 (six) hours as needed for wheezing or shortness of breath. 18 g 3  . amLODipine (NORVASC) 5 MG tablet Take 1 tablet (5 mg total) by mouth daily. 30 tablet 5  . apixaban (ELIQUIS) 2.5 MG TABS tablet Take 1 tablet (2.5 mg total) by mouth 2 (two) times daily. 180 tablet 3  . Ascorbic Acid (VITAMIN C) 1000 MG tablet Take 1,000 mg by mouth daily.    Marland Kitchen aspirin EC 81 MG EC tablet Take 1 tablet (81 mg total) by mouth daily. 30 tablet 0  . atorvastatin (LIPITOR) 40 MG tablet Take 1 tablet (40 mg total) by mouth daily at 6 PM. 90 tablet 1  . cholecalciferol (VITAMIN D3) 25 MCG (1000 UNIT) tablet Take 1,000 Units by mouth daily.    . metoprolol tartrate (LOPRESSOR) 50 MG tablet Take 1 tablet (50 mg total) by mouth 2 (two) times daily. 180 tablet 1  . Multiple Vitamin (MULTIVITAMIN WITH MINERALS) TABS tablet Take 1 tablet by mouth daily.    [name] 40 MG capsule Take 160 mg by mouth daily.       No current facility-administered medications on file prior to visit.    Allergies:  No Known Allergies   Physical Exam  There were no vitals filed for this visit. There is no height or weight on file to calculate BMI. No exam data present   General: well developed, well nourished,  pleasant elderly African-American male, seated, in no evident distress Head: head normocephalic and atraumatic.  Neck: supple with no carotid or supraclavicular bruits Cardiovascular: irregular rate and rhythm, no murmurs Musculoskeletal: no deformity Skin:  no rash/petichiae Vascular:  Normal pulses all extremities   Neurologic Exam Mental Status: Awake and fully alert.   Normal speech and language.  Oriented to place and time. Recent and remote memory intact. Attention span, concentration and fund of knowledge appropriate. Mood and affect appropriate.  Cranial Nerves: Fundoscopic exam reveals sharp disc margins. Pupils equal, briskly reactive to light. Extraocular movements full without nystagmus. Visual fields full to confrontation. Hearing intact. Facial sensation intact. Face, tongue, palate moves normally and symmetrically.  Motor: Normal bulk and tone. Normal strength in all tested extremity muscles except mild bilateral hip flexor weakness. Sensory.: intact to touch , pinprick , position and vibratory sensation.  Coordination: Rapid alternating movements normal in all extremities. Finger-to-nose and heel-to-shin performed accurately bilaterally. Gait and Station: Arises from chair without difficulty. Stance is normal. Gait demonstrates  short shuffled steps but is able to ambulate without assistive device or evidence of imbalance Reflexes: 1+ and symmetric. Toes downgoing.        ASSESSMENT: MICHAELPAUL FOLKER is a 86 y.o. year old male presented with sudden onset of inability to talk and right-sided weakness x1 hour on 03/18/2019 with stroke work-up likely left brain TIA likely secondary to  large vessel source due to bilateral ICA siphon stenosis left more than right. Vascular risk factors include atrial fibrillation on Eliquis, intracranial ICA stenosis, HTN, HLD, current tobacco use, advanced age and tachybradycardia status post pacer placement in 2014.  Recent diagnosis of mixed sleep apnea with Cheyenne Stokes respiration requiring use of ASV currently awaiting insurance approval to initiate treatment.  Stable from stroke standpoint without new or recurring stroke/TIA symptoms.    PLAN:  1. Left brain TIA: Continue aspirin 81 mg daily and Eliquis (apixaban) daily  and atorvastatin 40 mg daily for secondary stroke prevention. Maintain strict control of hypertension with blood pressure goal between 1 30-1 50, diabetes with hemoglobin A1c goal below 6.5% and cholesterol with LDL cholesterol (bad cholesterol) goal below 70 mg/dL.  I also advised the patient to eat a healthy diet with plenty of whole grains, cereals, fruits and vegetables, exercise regularly with at least 30 minutes of continuous activity daily and maintain ideal body weight. 2. Intracranial ICA stenosis: Likely etiology of recent TIA.  Discussion regarding blood pressure with blood pressure goal 1 30-1 50 and continuation of aspirin 81 mg daily.  Recommend repeating imaging 6 months post TIA.  Highly encourage smoking cessation due to increased risk of worsening stenosis 3. Atrial fibrillation: Continuation of Eliquis and ongoing follow-up with cardiology 4. HTN: Advised to continue current treatment regimen.  Today's BP stable.  Advised to continue to monitor at home along with continued follow-up with PCP for management 5. HLD: Advised to continue current treatment regimen at this time but will repeat lipid panel at today's visit and if LDL>70, it will be recommended to switch to atorvastatin 6. Tobacco use: Discussion regarding importance of complete tobacco cessation due to extensive medical history and intracranial  stenosis which will likely worsen with ongoing tobacco use.  He verbalized understanding and is agreeable to quitting 7. Mixed sleep apnea, severe: Recent diagnosis and currently awaiting insurance approval to initiate ASV.  Initial compliance visit scheduled on 11/30/2019    Will follow up on 11/30/2019 previously scheduled for initial CPAP compliance visit as well as stroke follow .   I spent *** minutes of face-to-face and  non-face-to-face time with patient.  This included previsit chart review, lab review, study review, order entry, electronic health record documentation, patient education regarding prior TIA, importance of managing stroke risk factors and review of sleep study with importance of initiating ASV for management and answered all questions to patient satisfaction    [name], [facility]  [facility] [address] [place], [place] [address]  Phone [phone] Fax [phone] Note: This document was prepared with digital dictation and possible smart phrase technology. Any transcriptional errors that result from this process are unintentional.

## 2019-09-10 NOTE — Chronic Care Management (AMB) (Signed)
  Chronic Care Management   Note  09/10/2019 Name: [name] MRN: [id number] DOB: 1932/08/27  [name] is a 86 y.o. year old male who is a primary care patient of Burchette, Alinda Sierras, MD. I reached out to [name] by phone today in response to a referral sent by [name]'s PCP, [name], MD.   [name] was given information about Chronic Care Management services today including:  1. CCM service includes personalized support from designated clinical staff supervised by his physician, including individualized plan of care and coordination with other care providers 2. 24/7 contact phone numbers for assistance for urgent and routine care needs. 3. Service will only be billed when office clinical staff spend 20 minutes or more in a month to coordinate care. 4. Only one practitioner may furnish and bill the service in a calendar month. 5. The patient may stop CCM services at any time (effective at the end of the month) by phone call to the office staff.   Patient agreed to services and verbal consent obtained.   Follow up plan:   [place]

## 2019-09-15 ENCOUNTER — Telehealth: Payer: Medicare HMO

## 2019-09-28 IMAGING — PT NM PET NOPR SKULL BASE TO THIGH
8 series · 25 of 25 positions shown · non-contrast
Comparison: Bone scan 03/23/2015

CLINICAL DATA: Prostate cancer with elevated PSA

EXAM:
NUCLEAR MEDICINE PET SKULL BASE TO THIGH
TECHNIQUE: 9.1 mCi F-18 Fluciclovine was injected intravenously. Full-ring PET
imaging was performed from the skull base to thigh after the
radiotracer. CT data was obtained and used for attenuation
correction and anatomic localization.

[Series 3: pet sk_thigh ac · axial · 5.0mm · 4.07mm/px · z∈[+549,+1437]mm · 4 of 223 slices shown]
[im 1/223]
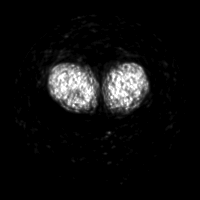
[im 75/223]
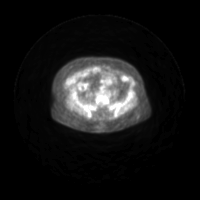
[im 149/223]
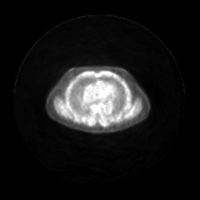
[im 223/223]
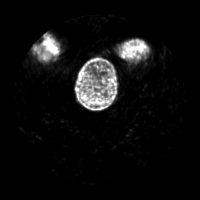

[Series 4: ct sk_thigh 5.0 b31f · axial · 5.0mm · 0.98mm/px · z∈[+549,+1437]mm · 5 of 223 slices shown]
[im 1/223]
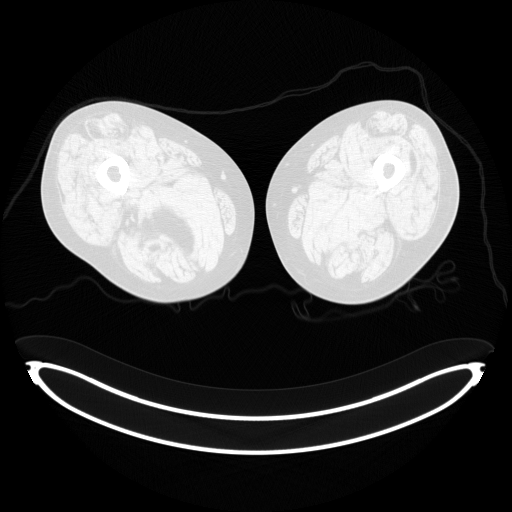
[im 56/223]
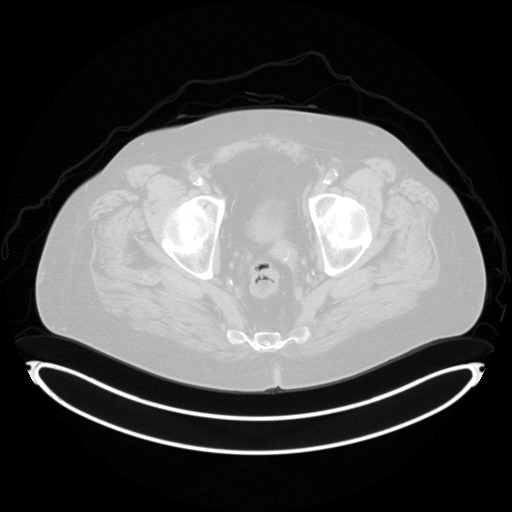
[im 112/223]
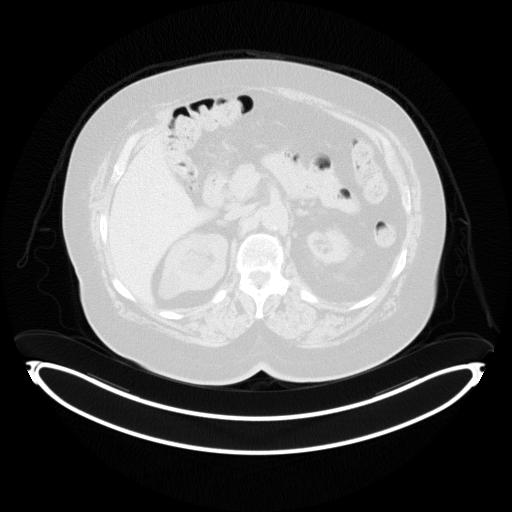
[im 167/223]
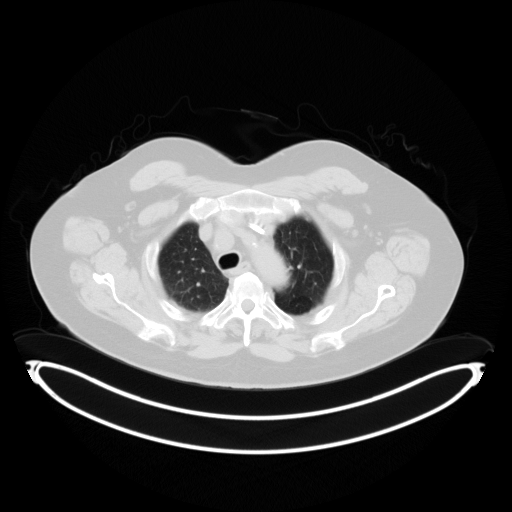
[im 223/223  brain]
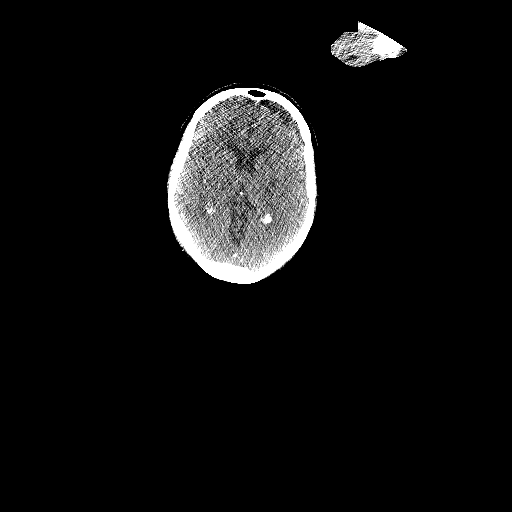

[Series 5: pet sk_thigh nac · axial · 5.0mm · 4.07mm/px · z∈[+549,+1437]mm · 5 of 223 slices shown]
[im 1/223]
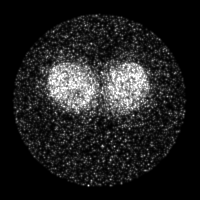
[im 56/223]
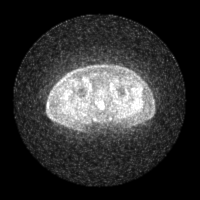
[im 112/223]
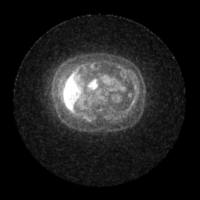
[im 167/223]
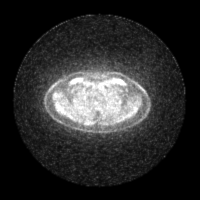
[im 223/223]
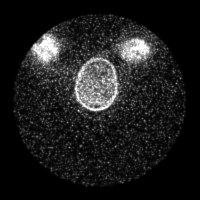

[Series 8: ct sk_thigh 5.0 b70f lung_bone · axial · 5.0mm · 0.73mm/px · z∈[+991,+1275]mm · 2 of 72 slices shown]
[im 1/72  bone]
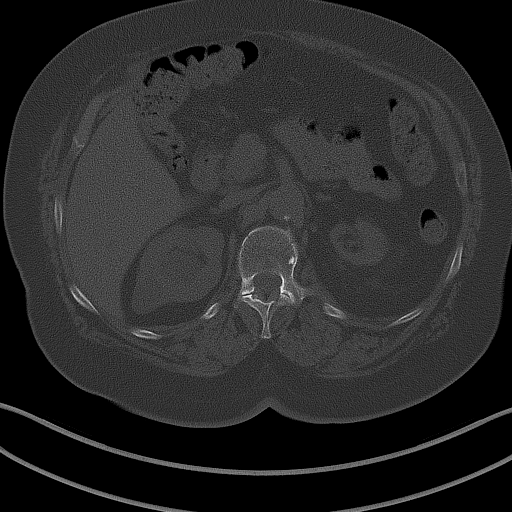
[im 72/72  bone]
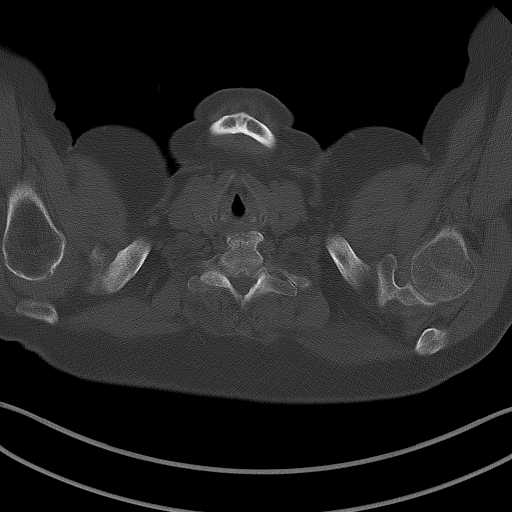

[Series 603: mip range 3 · coronal · 1.84mm/px · 1 of 32 slices shown]
[im 1/32]
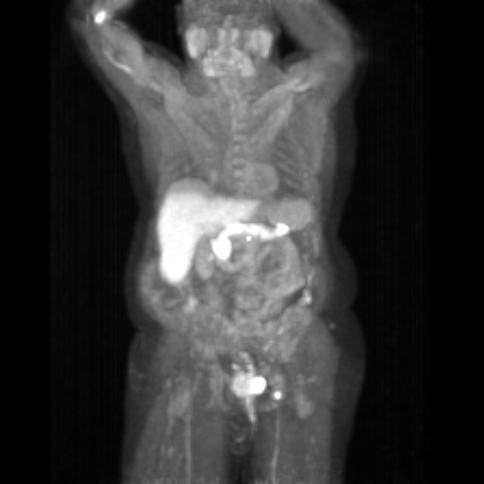

[Series 604: range-ct sk_thigh 5.0 (id)<alpha range> · 2 of 69 slices shown (1 of 2)]
[im 1/69]
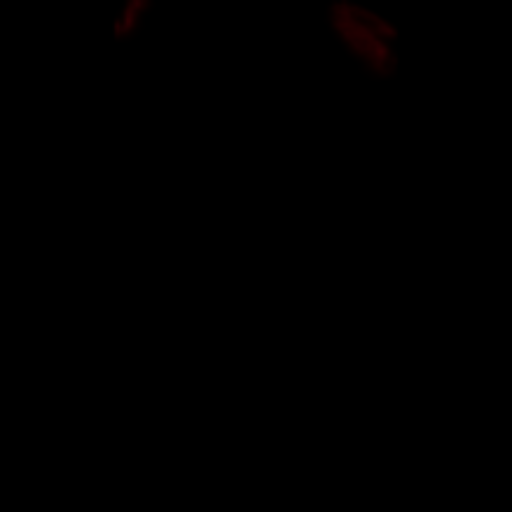
[im 69/69]
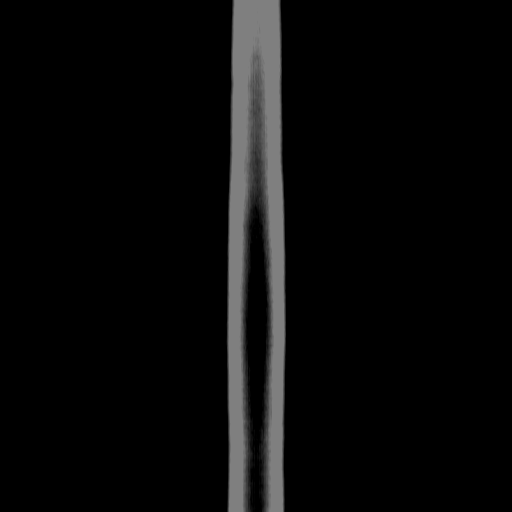

[Series 605: range-ct sk_thigh 5.0 (id)<alpha range> · 5 of 214 slices shown (2 of 2)]
[im 1/214]
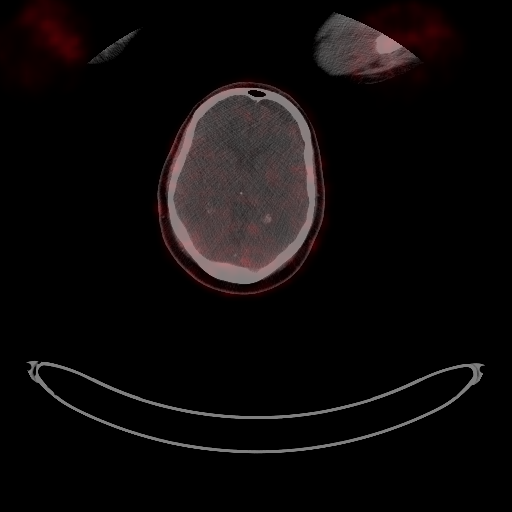
[im 54/214]
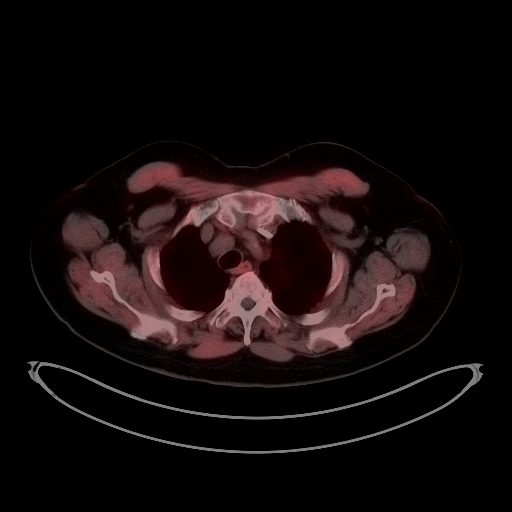
[im 107/214]
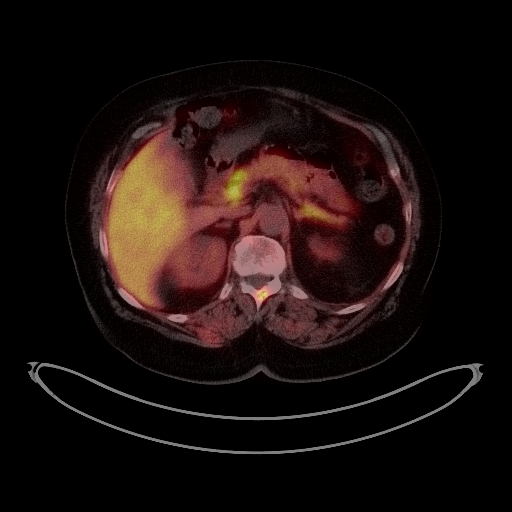
[im 160/214]
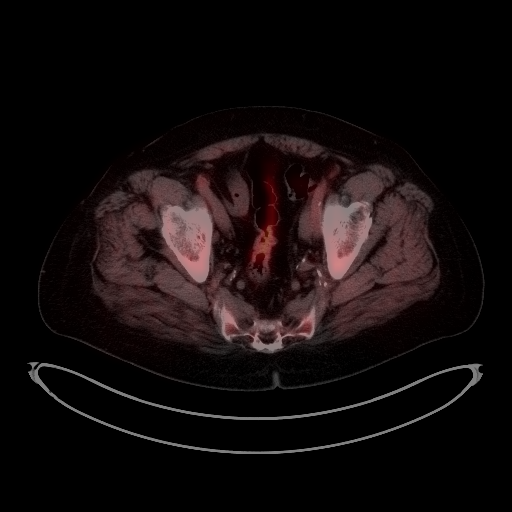
[im 214/214]
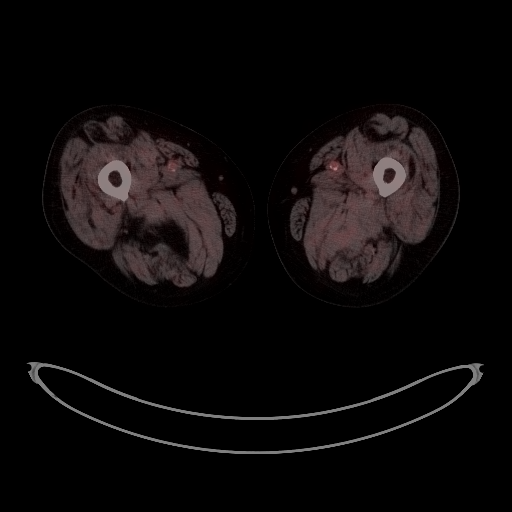

[Series 1077: results mm oncology reading · 1.0mm · 0.53mm/px · 1 of 4 slices shown]
[im 1/4]
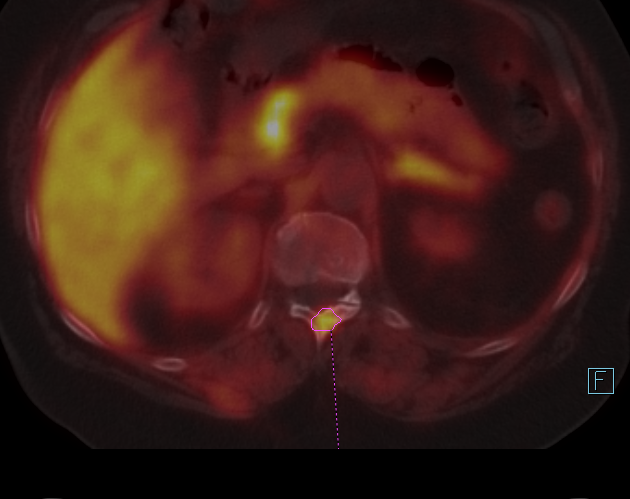

[25 of 25 positions shown; findings below may reference images not displayed]

FINDINGS: NECK

No radiotracer activity in neck lymph nodes.

CHEST

No radiotracer accumulation within mediastinal or hilar lymph nodes.
No suspicious pulmonary nodules on the CT scan.

ABDOMEN/PELVIS

Asymmetric bulge along the LEFT aspect of the prostate gland
measures 4.3 x 3.0 cm image 77, series 3 and has intense radiotracer
accumulation with SUV max equal 18.6.

Moderate activity associated with a small lymph node adjacent to the
RIGHT seminal vesicle measuring 6 mm - image 170, series 4 with SUV
max equal 6.2..

No additional evidence of abnormal radiotracer accumulation within
pelvic lymph nodes or periaortic lymph nodes.

No abnormal activity in liver.

LEFT kidney is atrophic

SKELETON

Focal activity within the inferior aspect of the LEFT inferior pubic
ramus with SUV max equal 16.4. This small lesion measures
approximately 1 cm but not identified on CT portion.

Second similar lesion within the neural arch of the T12 vertebral
body with SUV max equal 12.2. Small lytic lesion at this level.
IMPRESSION: 1. Asymmetric bulge along the LEFT aspect of the prostate gland
measuring up to 4 cm and with intense radiotracer accumulation
consistent with local prostate cancer recurrence.
2. Probable small deep RIGHT pelvic metastatic lymph node.
3. Clear evidence skeletal metastasis with small discrete lesions in
the LEFT inferior pubic ramus and neural arch at T12.

## 2019-09-29 DIAGNOSIS — R063 Periodic breathing: Secondary | ICD-10-CM | POA: Diagnosis not present

## 2019-09-29 DIAGNOSIS — G4733 Obstructive sleep apnea (adult) (pediatric): Secondary | ICD-10-CM | POA: Diagnosis not present

## 2019-09-29 DIAGNOSIS — G4737 Central sleep apnea in conditions classified elsewhere: Secondary | ICD-10-CM | POA: Diagnosis not present

## 2019-10-05 ENCOUNTER — Other Ambulatory Visit: Payer: Self-pay

## 2019-10-05 DIAGNOSIS — I10 Essential (primary) hypertension: Secondary | ICD-10-CM

## 2019-10-05 DIAGNOSIS — I480 Paroxysmal atrial fibrillation: Secondary | ICD-10-CM

## 2019-10-05 NOTE — Telephone Encounter (Signed)
Referral has been placed. 

## 2019-10-07 ENCOUNTER — Other Ambulatory Visit: Payer: Self-pay | Admitting: Adult Health

## 2019-10-07 ENCOUNTER — Other Ambulatory Visit: Payer: Self-pay

## 2019-10-07 ENCOUNTER — Ambulatory Visit: Payer: Medicare HMO

## 2019-10-07 DIAGNOSIS — E7800 Pure hypercholesterolemia, unspecified: Secondary | ICD-10-CM

## 2019-10-07 DIAGNOSIS — N1832 Chronic kidney disease, stage 3b: Secondary | ICD-10-CM

## 2019-10-07 DIAGNOSIS — I10 Essential (primary) hypertension: Secondary | ICD-10-CM

## 2019-10-07 DIAGNOSIS — I480 Paroxysmal atrial fibrillation: Secondary | ICD-10-CM

## 2019-10-08 NOTE — Chronic Care Management (AMB) (Signed)
Chronic Care Management Pharmacy  Name: [name]  MRN: [id number] DOB: 1932-12-03  Initial Questions: 1. Have you seen any other providers since your last visit? NA 2. Any changes in your medicines or health? No   Chief Complaint/ HPI  [name],  86 y.o. , male presents for their Initial CCM visit with the clinical pharmacist via telephone due to COVID-19 Pandemic.  PCP : [name], MD   Both patient and spouse were on the call.   Their chronic conditions include: Afib, HTN, HLD, Hxof CVA/ carotid artery stenosis, shortness of breath, metastatic prostate cancer, CKD stage III   Office Visits: 08/25/2019- Carolann Littler, MD- patient presented for office visit for follow up. OV BP: 164/98. Patient was advised to follow up closely with neurology regarding working up for sleep apnea. Discussed to add back amlodipine 5mg  once daily. Follow up in 1 month to reassess.   08/03/2019- Carolann Littler, MD- patient presented for office visit for annual exam. Patient to obtain BMET and TSH. Patient encouraged to step up activity levels. Patient to monitor BP and reach back if consistently > 140/90.   05/27/2019- Carolann Littler, MD- patient presented for virtual visit for epistaxis. Patient cautioned to avoid blowing nose. Patient to continue Eliquis due to high risk of complication with discontinuation of anticoagulation. Patient instructed if bleeding persisted to follow up in office to see if candidate for cauterization if bleed can be localized.   Consult Visit: 08/28/2019- Cardiology- [name], MD- Patient presented for office visit for follow up. OV BP: 155/94. Metoprolol increased to 50mg  twice daily for target BP: 130/80. Next follow up in 3 months.   06/02/2019- Neurology- [name], MD- patient presented for office visit for initial evaluation. Patient to proceed with OSA screening. Patient to be evaluated by [facility] or attended sleep study. Patient to  follow up with NP within 2 to 3 months.   Medications: Outpatient Encounter Medications as of 10/07/2019  Medication Sig  . albuterol (VENTOLIN HFA) 108 (90 Base) MCG/ACT inhaler Inhale 2 puffs into the lungs every 6 (six) hours as needed for wheezing or shortness of breath.  Marland Kitchen amLODipine (NORVASC) 5 MG tablet Take 1 tablet (5 mg total) by mouth daily.  Marland Kitchen apixaban (ELIQUIS) 2.5 MG TABS tablet Take 1 tablet (2.5 mg total) by mouth 2 (two) times daily.  . Ascorbic Acid (VITAMIN C) 1000 MG tablet Take 1,000 mg by mouth daily.  Marland Kitchen aspirin EC 81 MG EC tablet Take 1 tablet (81 mg total) by mouth daily.  . cholecalciferol (VITAMIN D3) 25 MCG (1000 UNIT) tablet Take 1,000 Units by mouth daily.  . metoprolol tartrate (LOPRESSOR) 50 MG tablet Take 1 tablet (50 mg total) by mouth 2 (two) times daily.  . Multiple Vitamin (MULTIVITAMIN WITH MINERALS) TABS tablet Take 1 tablet by mouth daily.  [name] 40 MG capsule Take 80 mg by mouth daily.   . [DISCONTINUED] atorvastatin (LIPITOR) 40 MG tablet Take 1 tablet (40 mg total) by mouth daily at 6 PM.   No facility-administered encounter medications on file as of 10/07/2019.     Current Diagnosis/Assessment:  Goals Addressed            This Visit's Progress   . Pharmacy Care Plan       CARE PLAN ENTRY  Current Barriers:  . Chronic Disease Management support, education, and care coordination needs related to Hypertension, Hyperlipidemia, Atrial Fibrillation, and Chronic Kidney Disease   Hypertension . Pharmacist Clinical Goal(s):  o Over the next 30 days, patient will work with PharmD and providers to maintain BP goal <130/80 . Current regimen:   Amlodipine 5mg , 1 tablet once daily (night)   Metoprolol tartrate 50mg , 1 tablet twice daily  . Interventions: o Recommend increase of amlodipine to 10 mg once daily.  . Patient self care activities - Over the next 180 days, patient will: o Check BP twice daily, document, and provide at future  appointments  Hyperlipidemia . Pharmacist Clinical Goal(s): o Over the next 180 days, patient will work with PharmD and providers to achieve LDL goal < 70 . Current regimen:  o Atorvastatin 40mg , 1 tablet daily  . Interventions: o We discussed how a diet high in plant sterols (fruits/vegetables/nuts/whole grains/legumes) may reduce your cholesterol.  Encouraged increasing fiber to a daily intake of 10-25g/day  . Patient self care activities - Over the next 180 days, patient will: o Continue current medications  Atrial fibrillation . Pharmacist Clinical Goal(s) o Over the next 180 days, patient will work with PharmD and providers to Maintain normal sinus rhythm.  . Current regimen:  o Metoprolol tartrate 50mg , 1 tablet twice daily o apixaban (Eliquis) 2.5mg , 1 tablet twice daily  . Interventions: o We discussed:  monitoring for signs and symptoms for bleeding (coughing up blood, prolonged nose bleeds, black, tarry stools). . Patient self care activities - Over the next 180 days, patient will: o Continue current medications and monitor pulse.   Chronic Kidney Disease, stage 3  . Pharmacist Clinical Goal(s) o Over the next 180 days, patient will work with PharmD and providers to maintain kidney function  . Interventions: o Make sure to stay hydrate and avoid medications like NSAIDs.   Medication management . Pharmacist Clinical Goal(s): o Over the next 180 days, patient will work with PharmD and providers to maintain optimal medication adherence . Current pharmacy: [facility] order/ [facility]  . Interventions o Comprehensive medication review performed. o Continue current medication management strategy o For assistance for medication cost, consider applying to Extra Help by:  - Calling: [phone] - Website:  [email or web address]  . Patient self care activities - Over the next 180 days, patient will: o Take medications as prescribed o Report any questions or  concerns to PharmD and/or provider(s)  Initial goal documentation       SDOH Interventions     Most Recent Value  SDOH Interventions  Financial Strain Interventions  Other (Comment) [Sending ExtraHelp information to apply]       AFIB    Patient is currently rate controlled. HR: 70 BPM  Patient has failed these meds in past: spouse reports patient has been on 2 blood thinners in the past   Patient is currently controlled on the following medications:   Metoprolol tartrate 50mg , 1 tablet twice daily   Anticoagulation:  This patients CHA2DS2-VASc Score and unadjusted Ischemic Stroke Rate (% per year) is equal to 7.2 % stroke rate/year from a score of 5 Above score calculated as 1 point each if present [CHF, HTN, DM, Vascular=MI/PAD/Aortic Plaque, Age if 65-74, or Male] Above score calculated as 2 points each if present [Age > 75, or Stroke/TIA/TE]  apixaban (Eliquis) 2.5mg , 1 tablet twice daily   We discussed:  monitoring for signs and symptoms for bleeding (coughing up blood, prolonged nose bleeds, black, tarry stools).  Plan Continue current medications  Hypertension  Per cardiology notes, BP goal <130/80   Office blood pressures are  BP Readings from Last 3 Encounters:  08/28/19 Marland Kitchen)  155/94  08/25/19 (!) 164/98  08/03/19 134/72   Patient has failed these meds in the past: losartan   Patient checks BP at home twice daily  Patient home BP readings are ranging: 140-150/ 76 Reports checking AM BP before taking medications  Patient is uncontrolled on:   Amlodipine 5mg , 1 tablet once daily (night)   Metoprolol tartrate 50mg , 1 tablet twice daily   We discussed diet and exercise extensively . Discussed diet modifications. DASH diet:  following a diet emphasizing fruits and vegetables and low-fat dairy products along with whole grains, fish, poultry, and nuts.  . Reducing the amount of salt intake to 1500mg /per day. (spouse states do not like salt)  . Exercise:  limited from feeling "sluggish" reported from [place]. Dose of Xtandi was decreased and states feeling better. Sits in recliner for hours and don't exercise legs.   Plan Consult with Dr. Elease Hashimoto on further management on BP.     Hyperlipidemia  Recent switch to atorvastatin (since last blood work)   Lipid Panel     Component Value Date/Time   CHOL 181 05/12/2019 0945   TRIG 206 (H) 05/12/2019 0945   TRIG 77 04/11/2006 0906   HDL 42 05/12/2019 0945   LDLCALC 103 (H) 05/12/2019 0945   LDLDIRECT 112.0 05/17/2017 0855    The ASCVD Risk score (Goff DC Jr., et al., 2013) failed to calculate for the following reasons:   The 2013 ASCVD risk score is only valid for ages 3 to 9   The patient has a prior MI or stroke diagnosis   Patient has failed these meds in past: simvastatin   Patient is currently uncontrolled on the following medications:  . Atorvastatin 40mg , 1 tablet daily   We discussed:  diet and exercise extensively . We discussed how a diet high in plant sterols (fruits/vegetables/nuts/whole grains/legumes) may reduce your cholesterol.  Encouraged increasing fiber to a daily intake of 10-25g/day   Plan Recommend lipid panel since statin change.  Continue current medications  Hx of CVA, carotid artery stenosis     Patient is currently on the following medications:  . Aspirin 81mg , 1 tablet once daily   Plan Continue current medications  Shortness of breath   Denied asthma/ copd diagnosis.   Patient is currently controlled on the following medications:  Albuterol (Ventolin HFA) 108 mcg/act, 2 puffs every six hours as needed for wheezing or shortness of breath   Uses PRN- reports not using everyday especially since patient does not much activity. Uses it more often during allergy season.   Plan Continue current medications  Metastatic prostate cancer   Reports being on this over 2 years.   Patient has failed these meds in past: none  Patient is currently on  the following medications:   Xtandi 40mg , 160mg  once daily   Plan Continue current medications  Vitamin D supplementation   Vit D, 25-Hydroxy  Date Value Ref Range Status  07/03/2012 28 (L) 30 - 89 ng/mL Final    Comment:    This assay accurately quantifies Vitamin D, which is the sum of the 25-Hydroxy forms of Vitamin D2 and D3.  Studies have shown that the optimum concentration of 25-Hydroxy Vitamin D is 30 ng/mL or higher.  Concentrations of Vitamin D between 20 and 29 ng/mL are considered to be insufficient and concentrations less than 20 ng/mL are considered to be deficient for Vitamin D.    Patient is currently on the following medications:   Vitamin D (cholecalciferol) 10,000 units,  1 tablet once daily   Plan Recommend Vitamin D level (due to patient taking daily high dose vitamin D).   Supplements    Patient is currently on the following medications:   Ascorbic acid (vitamin C) 1000mg , 1 tablet once daily  Multivitamin, 1 tablet once daily  We discussed:  - daily intake of vitamin C and drinking plenty of water   Plan Continue current medications  CKD, Stage III   Kidney Function Lab Results  Component Value Date/Time   CREATININE 1.92 (H) 08/03/2019 01:53 PM   CREATININE 2.00 (H) 03/18/2019 01:15 PM   CREATININE 1.98 (H) 04/06/2013 01:42 PM   GFR 40.54 (L) 08/03/2019 01:53 PM   GFRNONAA 30 (L) 03/18/2019 01:08 PM   GFRAA 35 (L) 03/18/2019 01:08 PM  Kidney function stable.   Plan Continue to monitor and adjust medications as needed.    Vaccines   Reviewed and discussed patient's vaccination history.    Immunization History  Administered Date(s) Administered  . Fluad Quad(high Dose 65+) 02/24/2019  . Influenza Split 03/22/2011, 04/01/2012  . Influenza Whole 02/18/2009, 04/13/2010  . Influenza, High Dose Seasonal PF 05/10/2014, 05/17/2015, 03/28/2016, 03/20/2017, 02/17/2018  . Influenza,inj,Quad PF,6+ Mos 02/16/2013  . Pneumococcal  Conjugate-13 11/11/2013  . Pneumococcal Polysaccharide-23 05/18/2016  . Tdap 11/22/2011    Plan Patient to bring in Covid vaccination information to be added to chart.   Medication Management  Patient organizes medications: spouse organizes medications. Uses pill box.  Primary pharmacy: [facility] order  Adherence: no gaps in refill history (per medication dispense history from 04/11/2019 to 10/07/2019)     Follow up Follow up visit with PharmD in September. Follow up call in 1 to 2 weeks for BP recheck.     Anson Crofts, PharmD Clinical Pharmacist [facility] [place] [phone]

## 2019-10-08 NOTE — Patient Instructions (Addendum)
Visit Information  Goals Addressed            This Visit's Progress   . Pharmacy Care Plan       CARE PLAN ENTRY  Current Barriers:  . Chronic Disease Management support, education, and care coordination needs related to Hypertension, Hyperlipidemia, Atrial Fibrillation, and Chronic Kidney Disease   Hypertension . Pharmacist Clinical Goal(s): o Over the next 30 days, patient will work with PharmD and providers to maintain BP goal <130/80 . Current regimen:   Amlodipine 5mg , 1 tablet once daily (night)   Metoprolol tartrate 50mg , 1 tablet twice daily  . Interventions: o Recommend increase of amlodipine to 10 mg once daily.  . Patient self care activities - Over the next 180 days, patient will: o Check BP twice daily, document, and provide at future appointments  Hyperlipidemia . Pharmacist Clinical Goal(s): o Over the next 180 days, patient will work with PharmD and providers to achieve LDL goal < 70 . Current regimen:  o Atorvastatin 40mg , 1 tablet daily  . Interventions: o We discussed how a diet high in plant sterols (fruits/vegetables/nuts/whole grains/legumes) may reduce your cholesterol.  Encouraged increasing fiber to a daily intake of 10-25g/day  . Patient self care activities - Over the next 180 days, patient will: o Continue current medications  Atrial fibrillation . Pharmacist Clinical Goal(s) o Over the next 180 days, patient will work with PharmD and providers to Maintain normal sinus rhythm.  . Current regimen:  o Metoprolol tartrate 50mg , 1 tablet twice daily o apixaban (Eliquis) 2.5mg , 1 tablet twice daily  . Interventions: o We discussed:  monitoring for signs and symptoms for bleeding (coughing up blood, prolonged nose bleeds, black, tarry stools). . Patient self care activities - Over the next 180 days, patient will: o Continue current medications and monitor pulse.   Chronic Kidney Disease, stage 3  . Pharmacist Clinical Goal(s) o Over the next 180  days, patient will work with PharmD and providers to maintain kidney function  . Interventions: o Make sure to stay hydrate and avoid medications like NSAIDs.   Medication management . Pharmacist Clinical Goal(s): o Over the next 180 days, patient will work with PharmD and providers to maintain optimal medication adherence . Current pharmacy: [facility]/ [facility]  . Interventions o Comprehensive medication review performed. o Continue current medication management strategy o For assistance for medication cost, consider applying to Extra Help by:  - Calling: [phone] - Website:  [email or web address]  . Patient self care activities - Over the next 180 days, patient will: o Take medications as prescribed o Report any questions or concerns to PharmD and/or provider(s)  Initial goal documentation        [name] was given information about Chronic Care Management services today including:  1. CCM service includes personalized support from designated clinical staff supervised by his physician, including individualized plan of care and coordination with other care providers 2. 24/7 contact phone numbers for assistance for urgent and routine care needs. 3. Standard insurance, coinsurance, copays and deductibles apply for chronic care management only during months in which we provide at least 20 minutes of these services. Most insurances cover these services at 100%, however patients may be responsible for any copay, coinsurance and/or deductible if applicable. This service may help you avoid the need for more expensive face-to-face services. 4. Only one practitioner may furnish and bill the service in a calendar month. 5. The patient may stop CCM services at any time (effective at  the end of the month) by phone call to the office staff.  Patient agreed to services and verbal consent obtained.   The patient verbalized understanding of instructions provided today  and agreed to receive a mailed copy of patient instruction and/or educational materials. Telephone follow up appointment with pharmacy team member scheduled for: 01/21/2020  Anson Crofts, PharmD Clinical Pharmacist [facility] [place] [phone]    Managing Your Hypertension Hypertension is commonly called high blood pressure. This is when the force of your blood pressing against the walls of your arteries is too strong. Arteries are blood vessels that carry blood from your heart throughout your body. Hypertension forces the heart to work harder to pump blood, and may cause the arteries to become narrow or stiff. Having untreated or uncontrolled hypertension can cause heart attack, stroke, kidney disease, and other problems. What are blood pressure readings? A blood pressure reading consists of a higher number over a lower number. Ideally, your blood pressure should be below 120/80. The first ("top") number is called the systolic pressure. It is a measure of the pressure in your arteries as your heart beats. The second ("bottom") number is called the diastolic pressure. It is a measure of the pressure in your arteries as the heart relaxes. What does my blood pressure reading mean? Blood pressure is classified into four stages. Based on your blood pressure reading, your health care provider may use the following stages to determine what type of treatment you need, if any. Systolic pressure and diastolic pressure are measured in a unit called mm Hg. Normal  Systolic pressure: below 123456.  Diastolic pressure: below 80. Elevated  Systolic pressure: [id number].  Diastolic pressure: below 80. Hypertension stage 1  Systolic pressure: 0000000.  Diastolic pressure: [id number]. Hypertension stage 2  Systolic pressure: [id number] or above.  Diastolic pressure: 90 or above. What health risks are associated with hypertension? Managing your hypertension is an important  responsibility. Uncontrolled hypertension can lead to:  A heart attack.  A stroke.  A weakened blood vessel (aneurysm).  Heart failure.  Kidney damage.  Eye damage.  Metabolic syndrome.  Memory and concentration problems. What changes can I make to manage my hypertension? Hypertension can be managed by making lifestyle changes and possibly by taking medicines. Your health care provider will help you make a plan to bring your blood pressure within a normal range. Eating and drinking   Eat a diet that is high in fiber and potassium, and low in salt (sodium), added sugar, and fat. An example eating plan is called the DASH (Dietary Approaches to Stop Hypertension) diet. To eat this way: ? Eat plenty of fresh fruits and vegetables. Try to fill half of your plate at each meal with fruits and vegetables. ? Eat whole grains, such as whole wheat pasta, brown rice, or whole grain bread. Fill about one quarter of your plate with whole grains. ? Eat low-fat diary products. ? Avoid fatty cuts of meat, processed or cured meats, and poultry with skin. Fill about one quarter of your plate with lean proteins such as fish, chicken without skin, beans, eggs, and tofu. ? Avoid premade and processed foods. These tend to be higher in sodium, added sugar, and fat.  Reduce your daily sodium intake. Most people with hypertension should eat less than 1,500 mg of sodium a day.  Limit alcohol intake to no more than 1 drink a day for nonpregnant women and 2 drinks a day for men. One drink  equals 12 oz of beer, 5 oz of wine, or 1 oz of hard liquor. Lifestyle  Work with your health care provider to maintain a healthy body weight, or to lose weight. Ask what an ideal weight is for you.  Get at least 30 minutes of exercise that causes your heart to beat faster (aerobic exercise) most days of the week. Activities may include walking, swimming, or biking.  Include exercise to strengthen your muscles (resistance  exercise), such as weight lifting, as part of your weekly exercise routine. Try to do these types of exercises for 30 minutes at least 3 days a week.  Do not use any products that contain nicotine or tobacco, such as cigarettes and e-cigarettes. If you need help quitting, ask your health care provider.  Control any long-term (chronic) conditions you have, such as high cholesterol or diabetes. Monitoring  Monitor your blood pressure at home as told by your health care provider. Your personal target blood pressure may vary depending on your medical conditions, your age, and other factors.  Have your blood pressure checked regularly, as often as told by your health care provider. Working with your health care provider  Review all the medicines you take with your health care provider because there may be side effects or interactions.  Talk with your health care provider about your diet, exercise habits, and other lifestyle factors that may be contributing to hypertension.  Visit your health care provider regularly. Your health care provider can help you create and adjust your plan for managing hypertension. Will I need medicine to control my blood pressure? Your health care provider may prescribe medicine if lifestyle changes are not enough to get your blood pressure under control, and if:  Your systolic blood pressure is 130 or higher.  Your diastolic blood pressure is 80 or higher. Take medicines only as told by your health care provider. Follow the directions carefully. Blood pressure medicines must be taken as prescribed. The medicine does not work as well when you skip doses. Skipping doses also puts you at risk for problems. Contact a health care provider if:  You think you are having a reaction to medicines you have taken.  You have repeated (recurrent) headaches.  You feel dizzy.  You have swelling in your ankles.  You have trouble with your vision. Get help right away  if:  You develop a severe headache or confusion.  You have unusual weakness or numbness, or you feel faint.  You have severe pain in your chest or abdomen.  You vomit repeatedly.  You have trouble breathing. Summary  Hypertension is when the force of blood pumping through your arteries is too strong. If this condition is not controlled, it may put you at risk for serious complications.  Your personal target blood pressure may vary depending on your medical conditions, your age, and other factors. For most people, a normal blood pressure is less than 120/80.  Hypertension is managed by lifestyle changes, medicines, or both. Lifestyle changes include weight loss, eating a healthy, low-sodium diet, exercising more, and limiting alcohol. This information is not intended to replace advice given to you by your health care provider. Make sure you discuss any questions you have with your health care provider. Document Revised: 08/22/2018 Document Reviewed: 03/28/2016 Elsevier Patient Education  [place].

## 2019-10-22 ENCOUNTER — Telehealth: Payer: Self-pay

## 2019-10-22 ENCOUNTER — Ambulatory Visit: Payer: Self-pay

## 2019-10-22 DIAGNOSIS — N1832 Chronic kidney disease, stage 3b: Secondary | ICD-10-CM

## 2019-10-22 DIAGNOSIS — I10 Essential (primary) hypertension: Secondary | ICD-10-CM

## 2019-10-22 DIAGNOSIS — E7800 Pure hypercholesterolemia, unspecified: Secondary | ICD-10-CM

## 2019-10-22 DIAGNOSIS — I480 Paroxysmal atrial fibrillation: Secondary | ICD-10-CM

## 2019-10-22 NOTE — Telephone Encounter (Signed)
Spoke with wife Remo Lipps to remind of missed remote transmission

## 2019-10-22 NOTE — Chronic Care Management (AMB) (Signed)
Chronic Care Management Pharmacy  Name: [name]  MRN: [id number] DOB: June 09, 1932  Initial Questions: 1. Have you seen any other providers since your last visit? Yes  2. Any changes in your medicines or health? Yes - dose of Xtandi was decreased.   Chief Complaint/ HPI  [name],  86 y.o. , male presents for their Follow-Up CCM visit with the clinical pharmacist via telephone due to COVID-19 Pandemic.  PCP : [name], MD   Both patient and spouse were on the call. This was a follow up call for HTN management. At previous CCM call, Dr. Elease Hashimoto was consulted on increasing amlodipine dose (caution due to previous concerns of edema). Called patient to follow up on BP readings.   Their chronic conditions include: Afib, HTN, HLD, Hxof CVA/ carotid artery stenosis, shortness of breath, metastatic prostate cancer, CKD stage III   Office Visits: 08/25/2019- Carolann Littler, MD- patient presented for office visit for follow up. OV BP: 164/98. Patient was advised to follow up closely with neurology regarding working up for sleep apnea. Discussed to add back amlodipine 5mg  once daily. Follow up in 1 month to reassess.   08/03/2019- Carolann Littler, MD- patient presented for office visit for annual exam. Patient to obtain BMET and TSH. Patient encouraged to step up activity levels. Patient to monitor BP and reach back if consistently > 140/90.   05/27/2019- Carolann Littler, MD- patient presented for virtual visit for epistaxis. Patient cautioned to avoid blowing nose. Patient to continue Eliquis due to high risk of complication with discontinuation of anticoagulation. Patient instructed if bleeding persisted to follow up in office to see if candidate for cauterization if bleed can be localized.   Consult Visit: 08/28/2019- Cardiology- [name], MD- Patient presented for office visit for follow up. OV BP: 155/94. Metoprolol increased to 50mg  twice daily for target BP:  130/80. Next follow up in 3 months.   06/02/2019- Neurology- [name], MD- patient presented for office visit for initial evaluation. Patient to proceed with OSA screening. Patient to be evaluated by [facility] or attended sleep study. Patient to follow up with NP within 2 to 3 months.   Medications: Outpatient Encounter Medications as of 10/22/2019  Medication Sig  . albuterol (VENTOLIN HFA) 108 (90 Base) MCG/ACT inhaler Inhale 2 puffs into the lungs every 6 (six) hours as needed for wheezing or shortness of breath.  Marland Kitchen amLODipine (NORVASC) 5 MG tablet Take 1 tablet (5 mg total) by mouth daily.  Marland Kitchen apixaban (ELIQUIS) 2.5 MG TABS tablet Take 1 tablet (2.5 mg total) by mouth 2 (two) times daily.  . Ascorbic Acid (VITAMIN C) 1000 MG tablet Take 1,000 mg by mouth daily.  Marland Kitchen aspirin EC 81 MG EC tablet Take 1 tablet (81 mg total) by mouth daily.  Marland Kitchen atorvastatin (LIPITOR) 40 MG tablet TAKE 1 TABLET (40 MG TOTAL) BY MOUTH DAILY AT 6 PM.  . cholecalciferol (VITAMIN D3) 25 MCG (1000 UNIT) tablet Take 1,000 Units by mouth daily.  . metoprolol tartrate (LOPRESSOR) 50 MG tablet Take 1 tablet (50 mg total) by mouth 2 (two) times daily.  . Multiple Vitamin (MULTIVITAMIN WITH MINERALS) TABS tablet Take 1 tablet by mouth daily.  [name] 40 MG capsule Take 80 mg by mouth daily.    No facility-administered encounter medications on file as of 10/22/2019.     Current Diagnosis/Assessment:  Goals Addressed            This Visit's Progress   . Pharmacy  Care Plan       CARE PLAN ENTRY  Current Barriers:  . Chronic Disease Management support, education, and care coordination needs related to Hypertension, Hyperlipidemia, Atrial Fibrillation, and Chronic Kidney Disease   Hypertension . Pharmacist Clinical Goal(s): o Over the next 90 days, patient will work with PharmD and providers to maintain BP goal <130/80 . Current regimen:   Amlodipine 5mg , 1 tablet once daily (night)   Metoprolol tartrate 50mg , 1  tablet twice daily  . Interventions: o Discussed proper techniques for home blood pressure monitoring.   . Patient self care activities - Over the next 90 days, patient will: o Check BP twice daily, document, and provide at future appointments and through [place].   Hyperlipidemia . Pharmacist Clinical Goal(s): o Over the next 90 days, patient will work with PharmD and providers to achieve LDL goal < 70 . Current regimen:  o Atorvastatin 40mg , 1 tablet daily  . Interventions: o We discussed how a diet high in plant sterols (fruits/vegetables/nuts/whole grains/legumes) may reduce your cholesterol.  Encouraged increasing fiber to a daily intake of 10-25g/day  . Patient self care activities - Over the next 90 days, patient will: o Continue current medications  Atrial fibrillation . Pharmacist Clinical Goal(s) o Over the next 90 days, patient will work with PharmD and providers to Maintain normal sinus rhythm.  . Current regimen:  o Metoprolol tartrate 50mg , 1 tablet twice daily o apixaban (Eliquis) 2.5mg , 1 tablet twice daily  . Interventions: o We discussed:  monitoring for signs and symptoms for bleeding (coughing up blood, prolonged nose bleeds, black, tarry stools). . Patient self care activities - Over the next 90 days, patient will: o Continue current medications and monitor pulse.   Chronic Kidney Disease, stage 3  . Pharmacist Clinical Goal(s) o Over the next 90 days, patient will work with PharmD and providers to maintain kidney function  . Interventions: o Make sure to stay hydrate and avoid medications like NSAIDs.   Medication management . Pharmacist Clinical Goal(s): o Over the next 90 days, patient will work with PharmD and providers to maintain optimal medication adherence . Current pharmacy: [facility] order/ [facility]  . Interventions o Comprehensive medication review performed. o Continue current medication management strategy o For assistance for medication  cost, consider applying to Extra Help by:  - Calling: [phone] - Website:  [email or web address]  . Patient self care activities - Over the next 90 days, patient will: o Take medications as prescribed o Report any questions or concerns to PharmD and/or provider(s)  Please see past updates related to this goal by clicking on the "Past Updates" button in the selected goal        SDOH Interventions     Most Recent Value  SDOH Interventions  Financial Strain Interventions Other (Comment)  [Patient and spouse working on applying for ExtraHelp/ Low income subsidy.]      SDOH Interventions     Most Recent Value  SDOH Interventions  Financial Strain Interventions Other (Comment)  [Patient and spouse working on applying for ExtraHelp/ Low income subsidy.]        AFIB    Patient is currently rate controlled. HR: 70 BPM  Patient has failed these meds in past: spouse reports patient has been on 2 blood thinners in the past   Patient is currently controlled on the following medications:   Metoprolol tartrate 50mg , 1 tablet twice daily   Anticoagulation:  This patients CHA2DS2-VASc Score and unadjusted Ischemic Stroke Rate (% per  year) is equal to 7.2 % stroke rate/year from a score of 5 Above score calculated as 1 point each if present [CHF, HTN, DM, Vascular=MI/PAD/Aortic Plaque, Age if 65-74, or Male] Above score calculated as 2 points each if present [Age > 75, or Stroke/TIA/TE]  apixaban (Eliquis) 2.5mg , 1 tablet twice daily   We discussed:  monitoring for signs and symptoms for bleeding (coughing up blood, prolonged nose bleeds, black, tarry stools).  Plan Continue current medications  Hypertension  Per cardiology notes, BP goal <130/80   Spouse reported, Per Dr. Tamera Stands?, not to increase blood pressure medications since Xtandi dose was decreased and want to avoid hypotension.   Office blood pressures are  BP Readings from Last 3 Encounters:    08/28/19 (!) 155/94  08/25/19 (!) 164/98  08/03/19 134/72   Patient has failed these meds in the past: losartan   Patient checks BP at home twice daily  Patient home BP readings are ranging:  112/63, 138/84, 141/56, 174/105, 161/98  (the higher BP readings, spouse reports checks BP while moving. Patient checks on his own. Spouse reports telling patient to check when he is calm and sitting down, but patient does not comply at times).   Patient is uncontrolled on (based on recent numbers):   Amlodipine 5mg , 1 tablet once daily (night)   Metoprolol tartrate 50mg , 1 tablet twice daily   We discussed  . Exercise: limited from feeling "sluggish" reported from [place]. Dose of Xtandi was decreased and states feeling better . Proper technique for BP monitoring and to report to ER/ call office if continues to have elevated BP readings after making sure patient is properly checking BP.   Plan Continue current medications and make sure using proper technique for home BP monitoring.   Spouse and patient to send BP readings in 2 weeks through MyChart.   Hyperlipidemia  Recent switch to atorvastatin (since last blood work)   Lipid Panel     Component Value Date/Time   CHOL 181 05/12/2019 0945   TRIG 206 (H) 05/12/2019 0945   TRIG 77 04/11/2006 0906   HDL 42 05/12/2019 0945   LDLCALC 103 (H) 05/12/2019 0945   LDLDIRECT 112.0 05/17/2017 0855    The ASCVD Risk score (Goff DC Jr., et al., 2013) failed to calculate for the following reasons:   The 2013 ASCVD risk score is only valid for ages 58 to 11   The patient has a prior MI or stroke diagnosis   Patient has failed these meds in past: simvastatin   Patient is currently uncontrolled on the following medications:  . Atorvastatin 40mg , 1 tablet daily   We discussed:  diet and exercise extensively . We discussed how a diet high in plant sterols (fruits/vegetables/nuts/whole grains/legumes) may reduce your cholesterol.  Encouraged  increasing fiber to a daily intake of 10-25g/day   Plan Continue current medications  Hx of CVA, carotid artery stenosis     Patient is currently on the following medications:  . Aspirin 81mg , 1 tablet once daily   Plan Continue current medications  Shortness of breath   Denied asthma/ copd diagnosis.   Patient is currently controlled on the following medications:  Albuterol (Ventolin HFA) 108 mcg/act, 2 puffs every six hours as needed for wheezing or shortness of breath   Uses PRN- reports not using everyday especially since patient does not much activity. Uses it more often during allergy season.   Plan Continue current medications  Metastatic prostate cancer   Dose was decreased  since last CCM visit, and spouse reports patient is not as "sluggish".   Patient has failed these meds in past: none  Patient is currently on the following medications:   Xtandi 40mg , 800mg  once daily   Plan Continue current medications  Vitamin D supplementation   Vit D, 25-Hydroxy  Date Value Ref Range Status  07/03/2012 28 (L) 30 - 89 ng/mL Final    Comment:    This assay accurately quantifies Vitamin D, which is the sum of the 25-Hydroxy forms of Vitamin D2 and D3.  Studies have shown that the optimum concentration of 25-Hydroxy Vitamin D is 30 ng/mL or higher.  Concentrations of Vitamin D between 20 and 29 ng/mL are considered to be insufficient and concentrations less than 20 ng/mL are considered to be deficient for Vitamin D.    Patient is currently on the following medications:   Vitamin D (cholecalciferol) 1,000 units, 1 tablet once daily   Plan Continue current medications.   Supplements    Patient is currently on the following medications:   Ascorbic acid (vitamin C) 1000mg , 1 tablet once daily  Multivitamin, 1 tablet once daily  We discussed:  - daily intake of vitamin C and drinking plenty of water   Plan Continue current medications  CKD, Stage III    Kidney Function Lab Results  Component Value Date/Time   CREATININE 1.92 (H) 08/03/2019 01:53 PM   CREATININE 2.00 (H) 03/18/2019 01:15 PM   CREATININE 1.98 (H) 04/06/2013 01:42 PM   GFR 40.54 (L) 08/03/2019 01:53 PM   GFRNONAA 30 (L) 03/18/2019 01:08 PM   GFRAA 35 (L) 03/18/2019 01:08 PM  Kidney function stable.   Plan Continue to monitor and adjust medications as needed.    Vaccines   Reviewed and discussed patient's vaccination history.    Immunization History  Administered Date(s) Administered  . Fluad Quad(high Dose 65+) 02/24/2019  . Influenza Split 03/22/2011, 04/01/2012  . Influenza Whole 02/18/2009, 04/13/2010  . Influenza, High Dose Seasonal PF 05/10/2014, 05/17/2015, 03/28/2016, 03/20/2017, 02/17/2018  . Influenza,inj,Quad PF,6+ Mos 02/16/2013  . Pneumococcal Conjugate-13 11/11/2013  . Pneumococcal Polysaccharide-23 05/18/2016  . Tdap 11/22/2011    Plan Patient to bring in Covid vaccination information to be added to chart.   Medication Management  Patient organizes medications: spouse organizes medications. Uses pill box.  Primary pharmacy: [facility]  Adherence: no gaps in refill history (per medication dispense history from 04/11/2019 to 10/07/2019)     Follow up Follow up visit with PharmD in September (address smoking cessation?)  Spouse and patient to work on sending BP readings through [place] in 2 weeks.      Anson Crofts, PharmD Clinical Pharmacist [facility] [place] [phone]

## 2019-10-22 NOTE — Chronic Care Management (AMB) (Signed)
Discussed with office staff and was given the following information to give to patient and spouse to set up [place] on their phones.   Patient can call [phone], this is the [place] number and IT can email the patient and give a activation code to set up from phone if they like! Just have patient call the number and IT will help them get set up.   Called patient and relayed above information.

## 2019-10-22 NOTE — Patient Instructions (Signed)
Visit Information  Goals Addressed            This Visit's Progress   . Pharmacy Care Plan       CARE PLAN ENTRY  Current Barriers:  . Chronic Disease Management support, education, and care coordination needs related to Hypertension, Hyperlipidemia, Atrial Fibrillation, and Chronic Kidney Disease   Hypertension . Pharmacist Clinical Goal(s): o Over the next 90 days, patient will work with PharmD and providers to maintain BP goal <130/80 . Current regimen:   Amlodipine 5mg , 1 tablet once daily (night)   Metoprolol tartrate 50mg , 1 tablet twice daily  . Interventions: o Discussed proper techniques for home blood pressure monitoring.   . Patient self care activities - Over the next 90 days, patient will: o Check BP twice daily, document, and provide at future appointments and through [place].   Hyperlipidemia . Pharmacist Clinical Goal(s): o Over the next 90 days, patient will work with PharmD and providers to achieve LDL goal < 70 . Current regimen:  o Atorvastatin 40mg , 1 tablet daily  . Interventions: o We discussed how a diet high in plant sterols (fruits/vegetables/nuts/whole grains/legumes) may reduce your cholesterol.  Encouraged increasing fiber to a daily intake of 10-25g/day  . Patient self care activities - Over the next 90 days, patient will: o Continue current medications  Atrial fibrillation . Pharmacist Clinical Goal(s) o Over the next 90 days, patient will work with PharmD and providers to Maintain normal sinus rhythm.  . Current regimen:  o Metoprolol tartrate 50mg , 1 tablet twice daily o apixaban (Eliquis) 2.5mg , 1 tablet twice daily  . Interventions: o We discussed:  monitoring for signs and symptoms for bleeding (coughing up blood, prolonged nose bleeds, black, tarry stools). . Patient self care activities - Over the next 90 days, patient will: o Continue current medications and monitor pulse.   Chronic Kidney Disease, stage 3  . Pharmacist Clinical  Goal(s) o Over the next 90 days, patient will work with PharmD and providers to maintain kidney function  . Interventions: o Make sure to stay hydrate and avoid medications like NSAIDs.   Medication management . Pharmacist Clinical Goal(s): o Over the next 90 days, patient will work with PharmD and providers to maintain optimal medication adherence . Current pharmacy: [facility]  . Interventions o Comprehensive medication review performed. o Continue current medication management strategy o For assistance for medication cost, consider applying to Extra Help by:  - Calling: [phone] - Website:  [email or web address]  . Patient self care activities - Over the next 90 days, patient will: o Take medications as prescribed o Report any questions or concerns to PharmD and/or provider(s)  Please see past updates related to this goal by clicking on the "Past Updates" button in the selected goal         Mr. Linden was given information about Chronic Care Management services today including:  1. CCM service includes personalized support from designated clinical staff supervised by his physician, including individualized plan of care and coordination with other care providers 2. 24/7 contact phone numbers for assistance for urgent and routine care needs. 3. Standard insurance, coinsurance, copays and deductibles apply for chronic care management only during months in which we provide at least 20 minutes of these services. Most insurances cover these services at 100%, however patients may be responsible for any copay, coinsurance and/or deductible if applicable. This service may help you avoid the need for more expensive face-to-face services. 4. Only one practitioner may  furnish and bill the service in a calendar month. 5. The patient may stop CCM services at any time (effective at the end of the month) by phone call to the office staff.  Patient agreed to  services and verbal consent obtained.   The patient verbalized understanding of instructions provided today and agreed to receive a mailed copy of patient instruction and/or educational materials. Telephone follow up appointment with pharmacy team member scheduled for: 01/21/2020  Anson Crofts, PharmD Clinical Pharmacist [facility] [place] [phone]

## 2019-10-30 DIAGNOSIS — G4737 Central sleep apnea in conditions classified elsewhere: Secondary | ICD-10-CM | POA: Diagnosis not present

## 2019-10-30 DIAGNOSIS — R063 Periodic breathing: Secondary | ICD-10-CM | POA: Diagnosis not present

## 2019-10-30 DIAGNOSIS — G4733 Obstructive sleep apnea (adult) (pediatric): Secondary | ICD-10-CM | POA: Diagnosis not present

## 2019-11-23 DIAGNOSIS — C61 Malignant neoplasm of prostate: Secondary | ICD-10-CM | POA: Diagnosis not present

## 2019-11-29 DIAGNOSIS — G4733 Obstructive sleep apnea (adult) (pediatric): Secondary | ICD-10-CM | POA: Diagnosis not present

## 2019-11-29 DIAGNOSIS — R063 Periodic breathing: Secondary | ICD-10-CM | POA: Diagnosis not present

## 2019-11-30 ENCOUNTER — Ambulatory Visit: Payer: Self-pay | Admitting: Adult Health

## 2019-11-30 DIAGNOSIS — C775 Secondary and unspecified malignant neoplasm of intrapelvic lymph nodes: Secondary | ICD-10-CM | POA: Diagnosis not present

## 2019-11-30 DIAGNOSIS — R8279 Other abnormal findings on microbiological examination of urine: Secondary | ICD-10-CM | POA: Diagnosis not present

## 2019-11-30 DIAGNOSIS — C61 Malignant neoplasm of prostate: Secondary | ICD-10-CM | POA: Diagnosis not present

## 2019-12-10 ENCOUNTER — Other Ambulatory Visit: Payer: Self-pay | Admitting: Cardiovascular Disease

## 2019-12-30 DIAGNOSIS — G4733 Obstructive sleep apnea (adult) (pediatric): Secondary | ICD-10-CM | POA: Diagnosis not present

## 2019-12-30 DIAGNOSIS — R063 Periodic breathing: Secondary | ICD-10-CM | POA: Diagnosis not present

## 2020-01-13 ENCOUNTER — Ambulatory Visit: Payer: Medicare HMO | Admitting: Adult Health

## 2020-01-13 ENCOUNTER — Encounter: Payer: Self-pay | Admitting: Adult Health

## 2020-01-13 ENCOUNTER — Other Ambulatory Visit: Payer: Self-pay

## 2020-01-13 VITALS — BP 161/100 | HR 87 | Ht 71.0 in | Wt 209.0 lb

## 2020-01-13 DIAGNOSIS — G4731 Primary central sleep apnea: Secondary | ICD-10-CM

## 2020-01-13 DIAGNOSIS — G459 Transient cerebral ischemic attack, unspecified: Secondary | ICD-10-CM

## 2020-01-13 NOTE — Progress Notes (Signed)
[facility] [address] [place]. [place] [address] [phone]       OFFICE FOLLOW UP NOTE  Mr. JARRAH BABICH Date of Birth:  [birth year] Medical Record Number:  [id number]   Reason for visit: Initial ASV and stroke follow-up    SUBJECTIVE:   CHIEF COMPLAINT:  Chief Complaint  Patient presents with  . Follow-up    rm 5  . Transient Ischemic Attack    Pt said he is not having any new sx.  . Sleep Apnea    HPI:   Mr. Marsalis is an 86 year old male with PMH of history of TIA 03/2019, atrial fibrillation, HTN, s/p pacer 2014 who is being seen today for initial ASV compliance visit.  He was previously seen through the stroke clinic on 05/12/2019 with complaints of excessive daytime fatigue and prior history of prior sleep study s/p " palate surgery".  Initial sleep study on 07/26/2019 showed mixed sleep apnea, severe degree with total AHI at 28.3 with cyclic breathing pattern more severe in NREM then and REM sleep and qualifies as Cheyne-Stokes respiration as well as mild PLMD.  Underwent CPAP titration on 08/31/2019 with central sleep apnea failing to respond to CPAP, BiPAP and BiPAP with ST function finally ASV of 15/6/ over 4 cmH2O showed remarkable reduction in AHI therefore ASV initiated.  Being seen today, 01/13/2020, for initial ASV compliance visit as well as TIA follow-up Compliance report from 12/14/2019 -01/12/2020 shows 30 out of 30 usage days with 30 days greater than 4 hours 400% compliance.  Average usage 9 hours and 0 minutes.  Residual AHI 2.4.  Leaks in the 95 percentile 0.1.  Settings min PS 6 and max PS 15 with EPAP level 4.  Reports doing well on ASV treatment without any difficulties. He reports improvement of daytime fatigue  Epworth Sleepiness Scale 7 Initial Epworth Sleepiness Scale 15   He has been stable from a stroke/TIA standpoint without new or reoccurring stroke/TIA symptoms He remains on Eliquis 2.5 mg twice daily and aspirin  81 mg daily without bleeding or bruising Continues on simvastatin 40 mg daily without myalgias Blood pressure today 161/100 -reports elevated blood pressure with cancer medication Continues to follow closely with PCP for HTN and HLD management Continues to follow closely with cardiology for atrial fibrillation and Eliquis management  No concerns at this time    ROS:   14 system review of systems performed and negative with exception of no concerns  PMH:  Past Medical History:  Diagnosis Date  . Disturbances of sulphur-bearing amino-acid metabolism   . Malignant neoplasm of prostate ([place])   . Obstructive sleep apnea (adult) (pediatric) 2004   surgery to coorrect  . Osteoarthrosis, unspecified whether generalized or localized, unspecified site   . Other and unspecified hyperlipidemia   . Other specified cardiac dysrhythmias(427.89)   . 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 Paroxysmal atrial fibrillation (HCC)   . Unspecified cerebral artery occlusion with cerebral infarction   . Unspecified disorder resulting from impaired renal function   . Unspecified essential hypertension   . Wears glasses   . Wears partial dentures    upper partial    PSH:  Past Surgical History:  Procedure Laterality Date  . APPLICATION OF A-CELL OF EXTREMITY Right 08/12/2013   Procedure: APPLICATION OF A-CELL OF EXTREMITY;  Surgeon: Theodoro Kos, DO;  Location: [facility];  Service: Plastics;  Laterality: Right;  . CHOLECYSTECTOMY  1987  . COLONOSCOPY    .  ent surgery  2004   correct snoring  . INGUINAL HERNIA REPAIR     left  . INSERT / REPLACE / REMOVE PACEMAKER  04/14/2013   dual chamber   / Dr Sallyanne Kuster  . MINOR IRRIGATION AND DEBRIDEMENT OF WOUND Right 03/04/2013   Procedure: MINOR IRRIGATION AND DEBRIDEMENT OF WOUND;  Surgeon: Theodoro Kos, DO;  Location: [facility];  Service: Plastics;  Laterality: Right;  . ORCHIECTOMY  2002  . PERMANENT PACEMAKER  INSERTION N/A 04/14/2013   Procedure: PERMANENT PACEMAKER INSERTION;  Surgeon: [name], MD;  Location: [facility] CATH [facility];  Service: Cardiovascular;  Laterality: N/A;  . SKIN SPLIT GRAFT Right 08/12/2013   Procedure: SKIN GRAFT SPLIT THICKNESS WITH PLACEMENT OF VAC TO RIGHT LOWER LEG/PLACEMENT OF ACELL TO RIGHT UPPER THIGH AREA (HARVEST SITE);  Surgeon: Theodoro Kos, DO;  Location: [facility];  Service: Plastics;  Laterality: Right;    Social History:  Social History   Socioeconomic History  . Marital status: Married    Spouse name: Consulting civil engineer  . Number of children: Not on file  . Years of education: Not on file  . Highest education level: Not on file  Occupational History  . Occupation: Retired  . Occupation: RETIRED    Employer: RETIRED  Tobacco Use  . Smoking status: Current Some Day Smoker    Packs/day: 0.50    Years: 30.00    Pack years: 15.00    Types: Cigarettes  . Smokeless tobacco: Never Used  . Tobacco comment:  pt stated he smokes 1 pack every 2 wks  Vaping Use  . Vaping Use: Never used  Substance and Sexual Activity  . Alcohol use: No  . Drug use: No  . Sexual activity: Not on file    Comment: started smoking 2-3 cig daily  Other Topics Concern  . Not on file  Social History Narrative  . Not on file   Social Determinants of Health   Financial Resource Strain: Medium Risk  . Difficulty of Paying Living Expenses: Somewhat hard  Food Insecurity:   . Worried About Charity fundraiser in the Last Year: Not on file  . Ran Out of Food in the Last Year: Not on file  Transportation Needs:   . Lack of Transportation (Medical): Not on file  . Lack of Transportation (Non-Medical): Not on file  Physical Activity:   . Days of Exercise per Week: Not on file  . Minutes of Exercise per Session: Not on file  Stress:   . Feeling of Stress : Not on file  Social Connections:   . Frequency of Communication with Friends and Family: Not on file  . Frequency  of Social Gatherings with Friends and Family: Not on file  . Attends Religious Services: Not on file  . Active Member of Clubs or Organizations: Not on file  . Attends Archivist Meetings: Not on file  . Marital Status: Not on file  Intimate Partner Violence:   . Fear of Current or Ex-Partner: Not on file  . Emotionally Abused: Not on file  . Physically Abused: Not on file  . Sexually Abused: Not on file    Family History:  Family History  Problem Relation Age of Onset  . Prostate cancer Father     Medications:   Current Outpatient Medications on File Prior to Visit  Medication Sig Dispense Refill  . albuterol (VENTOLIN HFA) 108 (90 Base) MCG/ACT inhaler Inhale 2 puffs into the lungs every  6 (six) hours as needed for wheezing or shortness of breath. 18 g 3  . amLODipine (NORVASC) 5 MG tablet Take 1 tablet (5 mg total) by mouth daily. 30 tablet 5  . Ascorbic Acid (VITAMIN C) 1000 MG tablet Take 1,000 mg by mouth daily.    Marland Kitchen aspirin EC 81 MG EC tablet Take 1 tablet (81 mg total) by mouth daily. 30 tablet 0  . atorvastatin (LIPITOR) 40 MG tablet TAKE 1 TABLET (40 MG TOTAL) BY MOUTH DAILY AT 6 PM. 90 tablet 1  . cholecalciferol (VITAMIN D3) 25 MCG (1000 UNIT) tablet Take 1,000 Units by mouth daily.    Marland Kitchen ELIQUIS 2.5 MG TABS tablet TAKE 1 TABLET(2.5 MG) BY MOUTH TWICE DAILY 180 tablet 3  . metoprolol tartrate (LOPRESSOR) 50 MG tablet Take 1 tablet (50 mg total) by mouth 2 (two) times daily. 180 tablet 1  . Multiple Vitamin (MULTIVITAMIN WITH MINERALS) TABS tablet Take 1 tablet by mouth daily.    [name] 40 MG capsule Take 80 mg by mouth daily.      No current facility-administered medications on file prior to visit.    Allergies:  No Known Allergies    OBJECTIVE:  Physical Exam  Vitals:   01/13/20 0755  BP: (!) 161/100  Pulse: 87  Weight: 209 lb (94.8 kg)  Height: 5\' 11"  (1.803 m)   Body mass index is 29.15 kg/m. No exam data present  General: well  developed, well nourished, seated, in no evident distress Head: head normocephalic and atraumatic.   Neck: supple with no carotid or supraclavicular bruits Cardiovascular: regular rate and rhythm, no murmurs    Neurologic Exam Mental Status: Awake and fully alert. Oriented to place and time. Recent and remote memory intact. Attention span, concentration and fund of knowledge appropriate. Mood and affect appropriate.  Cranial Nerves: Pupils equal, briskly reactive to light. Extraocular movements full without nystagmus. Visual fields full to confrontation. Hearing intact. Facial sensation intact. Face, tongue, palate moves normally and symmetrically.  Motor: Normal bulk and tone. Normal strength in all tested extremity muscles. Sensory.: intact to touch , pinprick , position and vibratory sensation.  Coordination: Rapid alternating movements normal in all extremities. Finger-to-nose and heel-to-shin performed accurately bilaterally. Gait and Station: Arises from chair without difficulty. Stance is normal. Gait demonstrates normal stride length and balance Reflexes: 1+ and symmetric. Toes downgoing.       ASSESSMENT: THERON CUMBIE is a 86 y.o. year old male with diagnosis of central sleep apnea after initial sleep study on 07/26/2019 with CPAP titration on 08/31/2019 central sleep apnea failing to respond to CPAP, BiPAP and BiPAP with ST function and finally ASV showed remarkable reduction in AHI.  Prior history of TIA in 03/2019  Excellent compliance with ASV and optimal residual AHI at 2.4 Continue current ASV settings  Stable from stroke standpoint Continue Eliquis for secondary stroke prevention history of atrial fibrillation as well as atorvastatin for secondary stroke prevention Continue to follow closely with PCP for HTN and HLD management Continue to follow closely with endocrinology for DM management Continue to follow closely with cardiology for atrial fibrillation and Eliquis  management    Follow up in 6 months or call earlier if needed   I spent 20 minutes of face-to-face and non-face-to-face time with patient.  This included previsit chart review, lab review, study review, order entry, electronic health record documentation, patient education regarding diagnosis of central sleep apnea, ongoing use of ASV therapy as well as review  of compliance report, discussed prior stroke history and ongoing importance of managing stroke risk factors and answered all the questions to patient satisfaction    [name], [facility]  [facility] [address] [place], [place] [address]  Phone [phone] Fax [phone] Note: This document was prepared with digital dictation and possible smart phrase technology. Any transcriptional errors that result from this process are unintentional.

## 2020-01-14 NOTE — Progress Notes (Signed)
I agree with the above plan 

## 2020-01-21 ENCOUNTER — Telehealth: Payer: Medicare HMO

## 2020-01-28 ENCOUNTER — Ambulatory Visit: Payer: Medicare HMO

## 2020-01-28 DIAGNOSIS — E7800 Pure hypercholesterolemia, unspecified: Secondary | ICD-10-CM

## 2020-01-28 DIAGNOSIS — I10 Essential (primary) hypertension: Secondary | ICD-10-CM

## 2020-01-28 NOTE — Chronic Care Management (AMB) (Signed)
Chronic Care Management Pharmacy  Name: [name]  MRN: [id number] DOB: Feb 27, 1933  Initial Questions: 1. Have you seen any other providers since your last visit? Yes  2. Any changes in your medicines or health? No   Chief Complaint/ HPI  [name],  86 y.o. , male presents for their Follow-Up CCM visit with the clinical pharmacist via telephone due to COVID-19 Pandemic.  PCP : [name], MD   Their chronic conditions include: Afib, HTN, HLD, Hxof CVA/ carotid artery stenosis, shortness of breath, metastatic prostate cancer, CKD stage III   Office Visits: 08/25/2019- Carolann Littler, MD- patient presented for office visit for follow up. OV BP: 164/98. Patient was advised to follow up closely with neurology regarding working up for sleep apnea. Discussed to add back amlodipine 5mg  once daily. Follow up in 1 month to reassess.   08/03/2019- Carolann Littler, MD- patient presented for office visit for annual exam. Patient to obtain BMET and TSH. Patient encouraged to step up activity levels. Patient to monitor BP and reach back if consistently > 140/90.   05/27/2019- Carolann Littler, MD- patient presented for virtual visit for epistaxis. Patient cautioned to avoid blowing nose. Patient to continue Eliquis due to high risk of complication with discontinuation of anticoagulation. Patient instructed if bleeding persisted to follow up in office to see if candidate for cauterization if bleed can be localized.   Consult Visit: 01/13/20 - Neuorology - [name], NP - Patient presented for follow up. No changes were made. Follow up in 6 months.   08/28/2019- Cardiology- [name], MD- Patient presented for office visit for follow up. OV BP: 155/94. Metoprolol increased to 50mg  twice daily for target BP: 130/80. Next follow up in 3 months.   06/02/2019- Neurology- [name], MD- patient presented for office visit for initial evaluation. Patient to proceed with OSA  screening. Patient to be evaluated by [facility] or attended sleep study. Patient to follow up with NP within 2 to 3 months.   Medications: Outpatient Encounter Medications as of 01/28/2020  Medication Sig  . albuterol (VENTOLIN HFA) 108 (90 Base) MCG/ACT inhaler Inhale 2 puffs into the lungs every 6 (six) hours as needed for wheezing or shortness of breath.  Marland Kitchen amLODipine (NORVASC) 5 MG tablet Take 1 tablet (5 mg total) by mouth daily.  . Ascorbic Acid (VITAMIN C) 1000 MG tablet Take 1,000 mg by mouth daily.  Marland Kitchen aspirin EC 81 MG EC tablet Take 1 tablet (81 mg total) by mouth daily.  Marland Kitchen atorvastatin (LIPITOR) 40 MG tablet TAKE 1 TABLET (40 MG TOTAL) BY MOUTH DAILY AT 6 PM.  . cholecalciferol (VITAMIN D3) 25 MCG (1000 UNIT) tablet Take 1,000 Units by mouth daily.  Marland Kitchen ELIQUIS 2.5 MG TABS tablet TAKE 1 TABLET(2.5 MG) BY MOUTH TWICE DAILY  . metoprolol tartrate (LOPRESSOR) 50 MG tablet Take 1 tablet (50 mg total) by mouth 2 (two) times daily.  . Multiple Vitamin (MULTIVITAMIN WITH MINERALS) TABS tablet Take 1 tablet by mouth daily.  [name] 40 MG capsule Take 80 mg by mouth daily.    No facility-administered encounter medications on file as of 01/28/2020.     Current Diagnosis/Assessment:  Goals Addressed            This Visit's Progress   . Pharmacy Care Plan       CARE PLAN ENTRY  Current Barriers:  . Chronic Disease Management support, education, and care coordination needs related to Hypertension, Hyperlipidemia, Atrial Fibrillation, and Chronic  Kidney Disease   Hypertension . Pharmacist Clinical Goal(s): o Over the next 90 days, patient will work with PharmD and providers to maintain BP goal <130/80 . Current regimen:   Amlodipine 5mg , 1 tablet once daily (night)   Metoprolol tartrate 50mg , 1 tablet twice daily  . Interventions: o Discussed proper techniques for home blood pressure monitoring.   . Patient self care activities - Over the next 90 days, patient will: o Check blood  pressure twice daily, document, and provide at future appointments.  Hyperlipidemia . Pharmacist Clinical Goal(s): o Over the next 90 days, patient will work with PharmD and providers to achieve LDL goal < 70 . Current regimen:  o Atorvastatin 40mg , 1 tablet daily  . Interventions: o We discussed how a diet high in plant sterols (fruits/vegetables/nuts/whole grains/legumes) may reduce your cholesterol.  Encouraged increasing fiber to a daily intake of 10-25g/day  . Patient self care activities - Over the next 90 days, patient will: o Continue current medications o Schedule 6 month follow up with Dr. Elease Hashimoto and plan to check cholesterol labs  Atrial fibrillation . Pharmacist Clinical Goal(s) o Over the next 90 days, patient will work with PharmD and providers to Maintain normal sinus rhythm.  . Current regimen:  o Metoprolol tartrate 50mg , 1 tablet twice daily o apixaban (Eliquis) 2.5mg , 1 tablet twice daily  . Interventions: o We discussed:  monitoring for signs and symptoms for bleeding (coughing up blood, prolonged nose bleeds, black, tarry stools). . Patient self care activities - Over the next 90 days, patient will: o Continue current medications and monitor pulse.  o Bring financial documents for patient assistance application to cardiology appointment on 9/20.  Chronic Kidney Disease, stage 3  . Pharmacist Clinical Goal(s) o Over the next 90 days, patient will work with PharmD and providers to maintain kidney function  . Interventions: o Make sure to stay hydrate and avoid medications like NSAIDs.   Medication management . Pharmacist Clinical Goal(s): o Over the next 90 days, patient will work with PharmD and providers to maintain optimal medication adherence . Current pharmacy: [facility] order/ [facility]  . Interventions o Comprehensive medication review performed. o Continue current medication management strategy o For assistance for medication cost, consider  applying to [employer] by:  - Calling: [phone] - Website:  [email or web address]  . Patient self care activities - Over the next 90 days, patient will: o Take medications as prescribed o Report any questions or concerns to PharmD and/or provider(s)  Please see past updates related to this goal by clicking on the "Past Updates" button in the selected goal           AFIB    Patient is currently rate controlled. HR: 70 BPM  Patient has failed these meds in past: spouse reports patient has been on 2 blood thinners in the past   Patient is currently controlled on the following medications:   Metoprolol tartrate 50mg , 1 tablet twice daily   Anticoagulation:  This patients CHA2DS2-VASc Score and unadjusted Ischemic Stroke Rate (% per year) is equal to 7.2 % stroke rate/year from a score of 5 Above score calculated as 1 point each if present [CHF, HTN, DM, Vascular=MI/PAD/Aortic Plaque, Age if 65-74, or Male] Above score calculated as 2 points each if present [Age > 75, or Stroke/TIA/TE]  apixaban (Eliquis) 2.5mg , 1 tablet twice daily   We discussed:  monitoring for signs and symptoms for bleeding (coughing up blood, prolonged nose bleeds, black, tarry stools).; patient assistance  for Eliquis  Plan Continue current medications  Bring financial documents to cardiology office for them to send to [facility] to complete Eliquis application.   Hypertension  Per cardiology notes, BP goal <130/80   Office blood pressures are  BP Readings from Last 3 Encounters:  01/13/20 (!) 161/100  08/28/19 (!) 155/94  08/25/19 (!) 164/98   Patient has failed these meds in the past: losartan   Patient checks BP at home daily   Patient home BP readings are ranging: 140/95 (today)    Patient is uncontrolled on (based on recent numbers):   Amlodipine 5mg , 1 tablet once daily (night)   Metoprolol tartrate 50mg , 1 tablet twice daily   We discussed  . Exercise: Adding in  exercise throughout the day - patient enjoys doing sit ups and walking . Recording blood pressure after checking and making sure to check after taking medicines.  Plan Continue current medications. Continue monitoring BP at home and make sure to keep a log of the readings. Follow up with CPA for BP assessment in 1 month.  Hyperlipidemia  Recent switch to atorvastatin (since last blood work).   LDL < 70   Lipid Panel     Component Value Date/Time   CHOL 181 05/12/2019 0945   TRIG 206 (H) 05/12/2019 0945   TRIG 77 04/11/2006 0906   HDL 42 05/12/2019 0945   LDLCALC 103 (H) 05/12/2019 0945   LDLDIRECT 112.0 05/17/2017 0855    The ASCVD Risk score (Goff DC Jr., et al., 2013) failed to calculate for the following reasons:   The 2013 ASCVD risk score is only valid for ages 81 to 76   The patient has a prior MI or stroke diagnosis   Patient has failed these meds in past: simvastatin   Patient is currently uncontrolled on the following medications:  . Atorvastatin 40mg , 1 tablet daily   We discussed:  diet and exercise extensively . We discussed how a diet high in plant sterols (fruits/vegetables/nuts/whole grains/legumes) may reduce your cholesterol.  Encouraged increasing fiber to a daily intake of 10-25g/day   Plan Continue current medications  Encouraged regular follow up with Dr. Elease Hashimoto and will recommend a lipid panel.  Hx of CVA, carotid artery stenosis     Patient is currently on the following medications:  . Aspirin 81mg , 1 tablet once daily   Plan Continue current medications  Shortness of breath   Denied asthma/ copd diagnosis.   Patient is currently controlled on the following medications:  Albuterol (Ventolin HFA) 108 mcg/act, 2 puffs every six hours as needed for wheezing or shortness of breath   Uses PRN- reports not using everyday especially since patient does not much activity. Uses it more often during allergy season.   Plan Continue current  medications  Metastatic prostate cancer   Dose was decreased since last CCM visit, and spouse reports patient is not as "sluggish" and able to exercise and do daily activities better.  Patient has failed these meds in past: none  Patient is currently on the following medications:   Xtandi 40mg , 800mg  once daily   Plan Continue current medications  Vitamin D supplementation   Vit D, 25-Hydroxy  Date Value Ref Range Status  07/03/2012 28 (L) 30 - 89 ng/mL Final    Comment:    This assay accurately quantifies Vitamin D, which is the sum of the 25-Hydroxy forms of Vitamin D2 and D3.  Studies have shown that the optimum concentration of 25-Hydroxy Vitamin D is 30  ng/mL or higher.  Concentrations of Vitamin D between 20 and 29 ng/mL are considered to be insufficient and concentrations less than 20 ng/mL are considered to be deficient for Vitamin D.    Patient is currently on the following medications:   Vitamin D (cholecalciferol) 1,000 units, 1 tablet once daily   Plan Continue current medications.  Recommend repeat vitamin D level with previous deficiency. Supplements    Patient is currently on the following medications:   Ascorbic acid (vitamin C) 1000mg , 1 tablet once daily  Multivitamin, 1 tablet once daily  Plan Continue current medications  CKD, Stage III   Kidney Function Lab Results  Component Value Date/Time   CREATININE 1.92 (H) 08/03/2019 01:53 PM   CREATININE 2.00 (H) 03/18/2019 01:15 PM   CREATININE 1.98 (H) 04/06/2013 01:42 PM   GFR 40.54 (L) 08/03/2019 01:53 PM   GFRNONAA 30 (L) 03/18/2019 01:08 PM   GFRAA 35 (L) 03/18/2019 01:08 PM   Kidney function stable.   Plan Continue to monitor and adjust medications as needed.    Vaccines   Reviewed and discussed patient's vaccination history.    Immunization History  Administered Date(s) Administered  . Fluad Quad(high Dose 65+) 02/24/2019  . Influenza Split 03/22/2011, 04/01/2012  . Influenza  Whole 02/18/2009, 04/13/2010  . Influenza, High Dose Seasonal PF 05/10/2014, 05/17/2015, 03/28/2016, 03/20/2017, 02/17/2018  . Influenza,inj,Quad PF,6+ Mos 02/16/2013  . Pneumococcal Conjugate-13 11/11/2013  . Pneumococcal Polysaccharide-23 05/18/2016  . Tdap 11/22/2011   Patient reported receiving COVID vaccine 2 dose series with [facility] on 2/13 & 07/22/19.   Plan Recommended patient receive Shingrix vaccine at pharmacy. Previously, patient reported it was too expensive but have not looked into cost this year. Plan to reach out to pharmacy to determine cost.  Medication Management  Patient organizes medications: spouse organizes medications. Uses pill box.  Primary pharmacy: [facility]  Adherence: no gaps in refill history (per medication dispense history from 08/02/2019 to 01/28/2020)   Follow up Follow up visit with PharmD in 3 months. Follow up BP assessment with CPA in 1 month.   [name], PharmD Clinical Pharmacist [place] [facility] [place]

## 2020-01-30 DIAGNOSIS — G4733 Obstructive sleep apnea (adult) (pediatric): Secondary | ICD-10-CM | POA: Diagnosis not present

## 2020-01-30 DIAGNOSIS — R063 Periodic breathing: Secondary | ICD-10-CM | POA: Diagnosis not present

## 2020-02-01 ENCOUNTER — Ambulatory Visit (INDEPENDENT_AMBULATORY_CARE_PROVIDER_SITE_OTHER): Payer: Medicare HMO | Admitting: Cardiovascular Disease

## 2020-02-01 ENCOUNTER — Encounter: Payer: Self-pay | Admitting: Cardiovascular Disease

## 2020-02-01 ENCOUNTER — Other Ambulatory Visit: Payer: Self-pay

## 2020-02-01 VITALS — BP 127/89 | HR 69 | Ht 71.0 in | Wt 208.0 lb

## 2020-02-01 DIAGNOSIS — I495 Sick sinus syndrome: Secondary | ICD-10-CM | POA: Diagnosis not present

## 2020-02-01 DIAGNOSIS — G4733 Obstructive sleep apnea (adult) (pediatric): Secondary | ICD-10-CM | POA: Diagnosis not present

## 2020-02-01 DIAGNOSIS — C61 Malignant neoplasm of prostate: Secondary | ICD-10-CM | POA: Diagnosis not present

## 2020-02-01 DIAGNOSIS — I480 Paroxysmal atrial fibrillation: Secondary | ICD-10-CM | POA: Diagnosis not present

## 2020-02-01 DIAGNOSIS — N1832 Chronic kidney disease, stage 3b: Secondary | ICD-10-CM

## 2020-02-01 DIAGNOSIS — Z950 Presence of cardiac pacemaker: Secondary | ICD-10-CM | POA: Diagnosis not present

## 2020-02-01 DIAGNOSIS — Z7901 Long term (current) use of anticoagulants: Secondary | ICD-10-CM

## 2020-02-01 DIAGNOSIS — I10 Essential (primary) hypertension: Secondary | ICD-10-CM | POA: Diagnosis not present

## 2020-02-01 DIAGNOSIS — C799 Secondary malignant neoplasm of unspecified site: Secondary | ICD-10-CM | POA: Diagnosis not present

## 2020-02-01 NOTE — Patient Instructions (Signed)

## 2020-02-01 NOTE — Progress Notes (Signed)
Cardiology Office Note    Date:  02/02/2020   ID:  [name], DOB 02-Sep-1932, MRN [id number]  PCP:  [name], MD  Cardiologist:  Quay Burow, M.D.;  [name], MD   Chief Complaint  Patient presents with  . Pacemaker Check  . Atrial Fibrillation    History of Present Illness:  [name] is a 86 y.o. male with sinus node dysfunction and paroxysmal atrial fibrillation, dual-chamber permanent pacemaker.  His device was implanted in 2014 (MRI conditional Medtronic Advisa dual-chamber pacemaker) for tachycardia-bradycardia syndrome. Additional medical problems include hyperlipidemia, hypertension, obstructive sleep apnea, renal insufficiency, metastatic prostate cancer followed by Dr. Jeffie Pollock.  He reports highly volatile blood pressure readings ever since starting the Xtandi.  He is not having a lot of problems with pain.  He has not had any bleeding issues.  He has obstructive sleep apnea.  Generally feels well, but complains of fatigue.  Denies dyspnea at rest or with activity, orthopnea, PND, edema, angina with activity, syncope or palpitations.  His most recent pacemaker download shows normal device function.  Estimated generator longevity is about 2.5 years.  He has 98.5 % atrial pacing with a good heart rate histogram distribution and only 5.4% ventricular pacing.  T as before, he has a very frequent (daily), but very brief bursts of paroxysmal atrial fibrillation,  so that the overall burden is only 0.2%.  Generally has good ventricular rate control during atrial fibrillation, but does have some episodes of atrial tachycardia with 1: 1 conduction.  His device has recorded 2 episodes of nonsustained ventricular tachycardia.  One was very brief consisting of 6 beats, but on July 24 he had an irregular rapid ventricular rhythm lasting for about 30 beats at 164 bpm on the average (had to "warm up" towards the end).   Past Medical History:  Diagnosis Date    . Disturbances of sulphur-bearing amino-acid metabolism   . Malignant neoplasm of prostate ([place])   . Obstructive sleep apnea (adult) (pediatric) 2004   surgery to coorrect  . Osteoarthrosis, unspecified whether generalized or localized, unspecified site   . Other and unspecified hyperlipidemia   . Other specified cardiac dysrhythmias(427.89)   . 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 Paroxysmal atrial fibrillation (HCC)   . Unspecified cerebral artery occlusion with cerebral infarction   . Unspecified disorder resulting from impaired renal function   . Unspecified essential hypertension   . Wears glasses   . Wears partial dentures    upper partial    Past Surgical History:  Procedure Laterality Date  . APPLICATION OF A-CELL OF EXTREMITY Right 08/12/2013   Procedure: APPLICATION OF A-CELL OF EXTREMITY;  Surgeon: Theodoro Kos, DO;  Location: [facility];  Service: Plastics;  Laterality: Right;  . CHOLECYSTECTOMY  1987  . COLONOSCOPY    . ent surgery  2004   correct snoring  . INGUINAL HERNIA REPAIR     left  . INSERT / REPLACE / REMOVE PACEMAKER  04/14/2013   dual chamber   / Dr Sallyanne Kuster  . MINOR IRRIGATION AND DEBRIDEMENT OF WOUND Right 03/04/2013   Procedure: MINOR IRRIGATION AND DEBRIDEMENT OF WOUND;  Surgeon: Theodoro Kos, DO;  Location: [facility];  Service: Plastics;  Laterality: Right;  . ORCHIECTOMY  2002  . PERMANENT PACEMAKER INSERTION N/A 04/14/2013   Procedure: PERMANENT PACEMAKER INSERTION;  Surgeon: [name], MD;  Location: [facility];  Service: Cardiovascular;  Laterality: N/A;  . SKIN  SPLIT GRAFT Right 08/12/2013   Procedure: SKIN GRAFT SPLIT THICKNESS WITH PLACEMENT OF VAC TO RIGHT LOWER LEG/PLACEMENT OF ACELL TO RIGHT UPPER THIGH AREA (HARVEST SITE);  Surgeon: Theodoro Kos, DO;  Location: [facility];  Service: Plastics;  Laterality: Right;    Current Medications: Outpatient Medications Prior to Visit   Medication Sig Dispense Refill  . albuterol (VENTOLIN HFA) 108 (90 Base) MCG/ACT inhaler Inhale 2 puffs into the lungs every 6 (six) hours as needed for wheezing or shortness of breath. 18 g 3  . amLODipine (NORVASC) 5 MG tablet Take 1 tablet (5 mg total) by mouth daily. 30 tablet 5  . Ascorbic Acid (VITAMIN C) 1000 MG tablet Take 1,000 mg by mouth daily.    Marland Kitchen aspirin EC 81 MG EC tablet Take 1 tablet (81 mg total) by mouth daily. 30 tablet 0  . atorvastatin (LIPITOR) 40 MG tablet TAKE 1 TABLET (40 MG TOTAL) BY MOUTH DAILY AT 6 PM. 90 tablet 1  . cholecalciferol (VITAMIN D3) 25 MCG (1000 UNIT) tablet Take 1,000 Units by mouth daily.    Marland Kitchen ELIQUIS 2.5 MG TABS tablet TAKE 1 TABLET(2.5 MG) BY MOUTH TWICE DAILY 180 tablet 3  . metoprolol tartrate (LOPRESSOR) 50 MG tablet Take 1 tablet (50 mg total) by mouth 2 (two) times daily. 180 tablet 1  . Multiple Vitamin (MULTIVITAMIN WITH MINERALS) TABS tablet Take 1 tablet by mouth daily.    [name] 40 MG capsule Take 80 mg by mouth daily.      No facility-administered medications prior to visit.     Allergies:   Patient has no known allergies.   Social History   Socioeconomic History  . Marital status: Married    Spouse name: Consulting civil engineer  . Number of children: Not on file  . Years of education: Not on file  . Highest education level: Not on file  Occupational History  . Occupation: Retired  . Occupation: RETIRED    Employer: RETIRED  Tobacco Use  . Smoking status: Current Some Day Smoker    Packs/day: 0.50    Years: 30.00    Pack years: 15.00    Types: Cigarettes  . Smokeless tobacco: Never Used  . Tobacco comment:  pt stated he smokes 1 pack every 2 wks  Vaping Use  . Vaping Use: Never used  Substance and Sexual Activity  . Alcohol use: No  . Drug use: No  . Sexual activity: Not on file    Comment: started smoking 2-3 cig daily  Other Topics Concern  . Not on file  Social History Narrative  . Not on file   Social Determinants  of Health   Financial Resource Strain: Medium Risk  . Difficulty of Paying Living Expenses: Somewhat hard  Food Insecurity:   . Worried About Charity fundraiser in the Last Year: Not on file  . Ran Out of Food in the Last Year: Not on file  Transportation Needs:   . Lack of Transportation (Medical): Not on file  . Lack of Transportation (Non-Medical): Not on file  Physical Activity:   . Days of Exercise per Week: Not on file  . Minutes of Exercise per Session: Not on file  Stress:   . Feeling of Stress : Not on file  Social Connections:   . Frequency of Communication with Friends and Family: Not on file  . Frequency of Social Gatherings with Friends and Family: Not on file  . Attends Religious Services: Not on  file  . Active Member of Clubs or Organizations: Not on file  . Attends Archivist Meetings: Not on file  . Marital Status: Not on file     Family History:  The patient's family history includes Prostate cancer in his father.   ROS:   Please see the history of present illness.    ROS All other systems are reviewed and are negative.   PHYSICAL EXAM:   VS:  BP 127/89   Pulse 69   Ht 5\' 11"  (1.803 m)   Wt 208 lb (94.3 kg)   SpO2 95%   BMI 29.01 kg/m    Vital signs reviewed.  Unable to examine.  Wt Readings from Last 3 Encounters:  02/01/20 208 lb (94.3 kg)  01/13/20 209 lb (94.8 kg)  08/28/19 204 lb (92.5 kg)     General: Alert, oriented x3, no distress, overweight, healthy left subclavian pacemaker site. Head: no evidence of trauma, PERRL, EOMI, no exophtalmos or lid lag, no myxedema, no xanthelasma; normal ears, nose and oropharynx Neck: normal jugular venous pulsations and no hepatojugular reflux; brisk carotid pulses without delay and no carotid bruits Chest: clear to auscultation, no signs of consolidation by percussion or palpation, normal fremitus, symmetrical and full respiratory excursions Cardiovascular: normal position and quality of the  apical impulse, regular rhythm, normal first and second heart sounds, no murmurs, rubs or gallops Abdomen: no tenderness or distention, no masses by palpation, no abnormal pulsatility or arterial bruits, normal bowel sounds, no hepatosplenomegaly Extremities: no clubbing, cyanosis or edema; 2+ radial, ulnar and brachial pulses bilaterally; 2+ right femoral, posterior tibial and dorsalis pedis pulses; 2+ left femoral, posterior tibial and dorsalis pedis pulses; no subclavian or femoral bruits Neurological: grossly nonfocal Psych: Normal mood and affect   Studies/Labs Reviewed:   EKG: Ordered today and shows atrial paced, ventricular sensed rhythm with flattened T waves in leads V4-V6 and 1 all the precordial leads, but otherwise normal.  QTc 397 ms  Recent Labs: 03/18/2019: ALT 12; Hemoglobin 13.9; Platelets 184 08/03/2019: BUN 25; Creatinine, Ser 1.92; Potassium 4.5; Sodium 143; TSH 1.77  11/23/2019 Creatinine 1.4, hemoglobin 14.1, potassium 4.3 Lipid Panel    Component Value Date/Time   CHOL 181 05/12/2019 0945   TRIG 206 (H) 05/12/2019 0945   TRIG 77 04/11/2006 0906   HDL 42 05/12/2019 0945   CHOLHDL 4.3 05/12/2019 0945   CHOLHDL 3.6 03/19/2019 0715   VLDL 21 03/19/2019 0715   LDLCALC 103 (H) 05/12/2019 0945   LDLDIRECT 112.0 05/17/2017 0855     ASSESSMENT:    1. Paroxysmal atrial fibrillation (HCC)   2. Long term (current) use of anticoagulants   3. Essential hypertension   4. SSS (sick sinus syndrome) ([place])   5. Pacemaker - Medtronic, implanted 04/14/13   6. OSA (obstructive sleep apnea)   7. Metastasis from malignant neoplasm of prostate ([place])   8. Stage 3b chronic kidney disease      PLAN:  In order of problems listed above:  1. AFib: His episodes are very frequent but also very brief.  Rate control on the average is good.  No bleeding problems on Eliquis.  He does have a history of stroke.  (CHADSVasc 5: age 26, stroke 2, hypertension).   2. Anticoagulation:  Anticoagulant dose adjusted for age and renal dysfunction.  No bleeding complications. 3. HTN: Adequate blood pressure control. 4. SSS: Appropriate heart rate histogram distribution based on current sensor settings.  Virtually 100% atrial pacing, although he complains of  fatigue, his heart rate histogram is appropriate and does not support a diagnosis of chronotropic incompetence. 5. PPM: Remote downloads every 3 months. 6. OSA: More likely explanation for his fatigue. 7. Metastatic prostate cancer: Denies pain and generally has good quality of life. 8. CKD 3: Previous creatinine baseline appears to be 1.9-2.0, but in July his creatinine was much better at 1.4.  For the time being would not recommend change in his dose of Eliquis.  Medication Adjustments/Labs and Tests Ordered: Current medicines are reviewed at length with the patient today.  Concerns regarding medicines are outlined above.  Medication changes, Labs and Tests ordered today are listed in the Patient Instructions below. Patient Instructions  Medication Instructions:  No changes *If you need a refill on your cardiac medications before your next appointment, please call your pharmacy*   Lab Work: None ordered If you have labs (blood work) drawn today and your tests are completely normal, you will receive your results only by: Marland Kitchen MyChart Message (if you have MyChart) OR . A paper copy in the mail If you have any lab test that is abnormal or we need to change your treatment, we will call you to review the results.   Testing/Procedures: None ordered   Follow-Up: At [facility], you and your health needs are our priority.  As part of our continuing mission to provide you with exceptional heart care, we have created designated Provider Care Teams.  These Care Teams include your primary Cardiologist (physician) and Advanced Practice Providers (APPs -  Physician Assistants and Nurse Practitioners) who all work together to provide  you with the care you need, when you need it.  We recommend signing up for the patient portal called "MyChart".  Sign up information is provided on this After Visit Summary.  MyChart is used to connect with patients for Virtual Visits (Telemedicine).  Patients are able to view lab/test results, encounter notes, upcoming appointments, etc.  Non-urgent messages can be sent to your provider as well.   To learn more about what you can do with MyChart, go to [email or web address].    Your next appointment:   12 month(s)  The format for your next appointment:   In Person  Provider:   [name], MD      Signed, [name], MD  02/02/2020 11:01 AM    [facility] [address], [place], [place]  [address] Phone: [phone]; Fax: [phone]

## 2020-02-11 ENCOUNTER — Other Ambulatory Visit: Payer: Self-pay | Admitting: General Practice

## 2020-02-18 ENCOUNTER — Other Ambulatory Visit: Payer: Self-pay | Admitting: Family Medicine

## 2020-02-29 DIAGNOSIS — G4733 Obstructive sleep apnea (adult) (pediatric): Secondary | ICD-10-CM | POA: Diagnosis not present

## 2020-02-29 DIAGNOSIS — R063 Periodic breathing: Secondary | ICD-10-CM | POA: Diagnosis not present

## 2020-03-04 DIAGNOSIS — C775 Secondary and unspecified malignant neoplasm of intrapelvic lymph nodes: Secondary | ICD-10-CM | POA: Diagnosis not present

## 2020-03-04 DIAGNOSIS — C61 Malignant neoplasm of prostate: Secondary | ICD-10-CM | POA: Diagnosis not present

## 2020-03-04 DIAGNOSIS — N183 Chronic kidney disease, stage 3 unspecified: Secondary | ICD-10-CM | POA: Diagnosis not present

## 2020-03-04 DIAGNOSIS — Z192 Hormone resistant malignancy status: Secondary | ICD-10-CM | POA: Diagnosis not present

## 2020-03-04 DIAGNOSIS — I509 Heart failure, unspecified: Secondary | ICD-10-CM | POA: Diagnosis not present

## 2020-03-04 LAB — PSA: PSA: <0.015

## 2020-03-07 ENCOUNTER — Ambulatory Visit: Payer: Self-pay | Admitting: Adult Health

## 2020-03-15 ENCOUNTER — Telehealth: Payer: Self-pay | Admitting: Family Medicine

## 2020-03-15 NOTE — Telephone Encounter (Signed)
Left message for patient to call back and schedule Medicare Annual Wellness Visit (AWV) either virtually or in office.  Last AWV 07/31/2017  Please schedule at any time with Advanced Pain Management Nurse Health Advisor 1.  This should be a 45 minute visit.

## 2020-03-31 DIAGNOSIS — R063 Periodic breathing: Secondary | ICD-10-CM | POA: Diagnosis not present

## 2020-03-31 DIAGNOSIS — G4733 Obstructive sleep apnea (adult) (pediatric): Secondary | ICD-10-CM | POA: Diagnosis not present

## 2020-04-21 ENCOUNTER — Other Ambulatory Visit: Payer: Self-pay | Admitting: Adult Health

## 2020-04-21 ENCOUNTER — Other Ambulatory Visit: Payer: Self-pay | Admitting: Family Medicine

## 2020-04-21 NOTE — Telephone Encounter (Signed)
Lats OV 08/25/19 Last refill 04/21/20 (by [name], NP) Next OV not scheduled

## 2020-04-27 ENCOUNTER — Telehealth: Payer: Self-pay | Admitting: Pharmacist

## 2020-04-27 NOTE — Chronic Care Management (AMB) (Signed)
I left the patient a message about his upcoming appointment on 04-28-2020 @ 10:00 AM with the clinical pharmacist. He was asked to please have all medication on hand to review the pharmacist.  Maia Breslow, [place] Assistant [phone]

## 2020-04-28 ENCOUNTER — Telehealth: Payer: Medicare HMO

## 2020-04-28 NOTE — Chronic Care Management (AMB) (Deleted)
Chronic Care Management Pharmacy  Name: [name]  MRN: [id number] DOB: [birth year]  Initial Questions: 1. Have you seen any other providers since your last visit? Yes  2. Any changes in your medicines or health? No   Chief Complaint/ HPI  [name],  86 y.o. , male presents for their Follow-Up CCM visit with the clinical pharmacist via telephone due to COVID-19 Pandemic.  PCP : Joseph Post, MD   Their chronic conditions include: Afib, HTN, HLD, Hxof CVA/ carotid artery stenosis, shortness of breath, metastatic prostate cancer, CKD stage III   Office Visits: 08/25/2019- Joseph Littler, MD- patient presented for office visit for follow up. OV BP: 164/98. Patient was advised to follow up closely with neurology regarding working up for sleep apnea. Discussed to add back amlodipine 5mg  once daily. Follow up in 1 month to reassess.   08/03/2019- Joseph Littler, MD- patient presented for office visit for annual exam. Patient to obtain BMET and TSH. Patient encouraged to step up activity levels. Patient to monitor BP and reach back if consistently > 140/90.   05/27/2019- Joseph Littler, MD- patient presented for virtual visit for epistaxis. Patient cautioned to avoid blowing nose. Patient to continue Eliquis due to high risk of complication with discontinuation of anticoagulation. Patient instructed if bleeding persisted to follow up in office to see if candidate for cauterization if bleed can be localized.   Consult Visit: 03/04/20 Joseph Crocker, NP: Patient presented for follow up. Unable to access notes.  02/01/20 Joseph Klein, MD (cardiology): Patient presented for Afib follow up. No changes made. Follow up in 1 year.   01/13/20 - Neuorology - Joseph Rider, NP - Patient presented for follow up. No changes were made. Follow up in 6 months.   08/28/2019- Cardiology- Joseph Klein, MD- Patient presented for office visit for follow up. OV BP: 155/94. Metoprolol  increased to 50mg  twice daily for target BP: 130/80. Next follow up in 3 months.   06/02/2019- Neurology- Joseph Seat, MD- patient presented for office visit for initial evaluation. Patient to proceed with OSA screening. Patient to be evaluated by [name] or attended sleep study. Patient to follow up with NP within 2 to 3 months.   Medications: Outpatient Encounter Medications as of 04/28/2020  Medication Sig  . albuterol (VENTOLIN HFA) 108 (90 Base) MCG/ACT inhaler Inhale 2 puffs into the lungs every 6 (six) hours as needed for wheezing or shortness of breath.  Marland Kitchen amLODipine (NORVASC) 5 MG tablet TAKE 1 TABLET(5 MG) BY MOUTH DAILY  . Ascorbic Acid (VITAMIN C) 1000 MG tablet Take 1,000 mg by mouth daily.  Marland Kitchen aspirin EC 81 MG EC tablet Take 1 tablet (81 mg total) by mouth daily.  Marland Kitchen atorvastatin (LIPITOR) 40 MG tablet Take 1 tablet (40 mg total) by mouth daily at 6 PM. Please have primary care provide further refills.  . cholecalciferol (VITAMIN D3) 25 MCG (1000 UNIT) tablet Take 1,000 Units by mouth daily.  Marland Kitchen ELIQUIS 2.5 MG TABS tablet TAKE 1 TABLET(2.5 MG) BY MOUTH TWICE DAILY  . metoprolol tartrate (LOPRESSOR) 50 MG tablet Take 1 tablet (50 mg total) by mouth 2 (two) times daily.  . Multiple Vitamin (MULTIVITAMIN WITH MINERALS) TABS tablet Take 1 tablet by mouth daily.  [name] 40 MG capsule Take 80 mg by mouth daily.    No facility-administered encounter medications on file as of 04/28/2020.     Current Diagnosis/Assessment:  Goals Addressed   None       AFIB  Patient is currently rate controlled. HR: 70 BPM  Patient has failed these meds in past: spouse reports patient has been on 2 blood thinners in the past   Patient is currently controlled on the following medications:   Metoprolol tartrate 50mg , 1 tablet twice daily   Anticoagulation:  This patients CHA2DS2-VASc Score and unadjusted Ischemic Stroke Rate (% per year) is equal to 7.2 % stroke rate/year from a score of  5 Above score calculated as 1 point each if present [CHF, HTN, DM, Vascular=MI/PAD/Aortic Plaque, Age if 65-74, or Male] Above score calculated as 2 points each if present [Age > 75, or Stroke/TIA/TE]  apixaban (Eliquis) 2.5mg , 1 tablet twice daily   We discussed:  monitoring for signs and symptoms for bleeding (coughing up blood, prolonged nose bleeds, black, tarry stools).; patient assistance for Eliquis  Plan Continue current medications  Bring financial documents to cardiology office for them to send to [facility] to complete Eliquis application.   Hypertension  Per cardiology notes, BP goal <130/80   Office blood pressures are  BP Readings from Last 3 Encounters:  02/01/20 127/89  01/13/20 (!) 161/100  08/28/19 (!) 155/94   Patient has failed these meds in the past: losartan   Patient checks BP at home daily   Patient home BP readings are ranging: 140/95 (today)    Patient is uncontrolled on (based on recent numbers):   Amlodipine 5mg , 1 tablet once daily (night)   Metoprolol tartrate 50mg , 1 tablet twice daily   We discussed  . Exercise: Adding in exercise throughout the day - patient enjoys doing sit ups and walking . Recording blood pressure after checking and making sure to check after taking medicines.  Plan Continue current medications. Continue monitoring BP at home and make sure to keep a log of the readings. Follow up with CPA for BP assessment in 1 month.  Hyperlipidemia  Recent switch to atorvastatin (since last blood work).   LDL < 70   Lipid Panel     Component Value Date/Time   CHOL 181 05/12/2019 0945   TRIG 206 (H) 05/12/2019 0945   TRIG 77 04/11/2006 0906   HDL 42 05/12/2019 0945   LDLCALC 103 (H) 05/12/2019 0945   LDLDIRECT 112.0 05/17/2017 0855    The ASCVD Risk score (Goff DC Jr., et al., 2013) failed to calculate for the following reasons:   The 2013 ASCVD risk score is only valid for ages 47 to 47   The patient has a prior MI or stroke  diagnosis   Patient has failed these meds in past: simvastatin   Patient is currently uncontrolled on the following medications:  . Atorvastatin 40mg , 1 tablet daily   We discussed:  diet and exercise extensively . We discussed how a diet high in plant sterols (fruits/vegetables/nuts/whole grains/legumes) may reduce your cholesterol.  Encouraged increasing fiber to a daily intake of 10-25g/day   Plan Continue current medications  Encouraged regular follow up with Dr. Elease Hashimoto and will recommend a lipid panel.  Hx of CVA, carotid artery stenosis     Patient is currently on the following medications:  . Aspirin 81mg , 1 tablet once daily   Plan Continue current medications  Shortness of breath   Denied asthma/ copd diagnosis.   Patient is currently controlled on the following medications:  Albuterol (Ventolin HFA) 108 mcg/act, 2 puffs every six hours as needed for wheezing or shortness of breath   Uses PRN- reports not using everyday especially since patient does not much  activity. Uses it more often during allergy season.   Plan Continue current medications  Metastatic prostate cancer   Dose was decreased since last CCM visit, and spouse reports patient is not as "sluggish" and able to exercise and do daily activities better.  Patient has failed these meds in past: none  Patient is currently on the following medications:   Xtandi 40mg , 800mg  once daily   Plan Continue current medications  Vitamin D supplementation   Vit D, 25-Hydroxy  Date Value Ref Range Status  07/03/2012 28 (L) 30 - 89 ng/mL Final    Comment:    This assay accurately quantifies Vitamin D, which is the sum of the 25-Hydroxy forms of Vitamin D2 and D3.  Studies have shown that the optimum concentration of 25-Hydroxy Vitamin D is 30 ng/mL or higher.  Concentrations of Vitamin D between 20 and 29 ng/mL are considered to be insufficient and concentrations less than 20 ng/mL are considered to be  deficient for Vitamin D.    Patient is currently on the following medications:   Vitamin D (cholecalciferol) 1,000 units, 1 tablet once daily   Plan Continue current medications.  Recommend repeat vitamin D level with previous deficiency. Supplements    Patient is currently on the following medications:   Ascorbic acid (vitamin C) 1000mg , 1 tablet once daily  Multivitamin, 1 tablet once daily  Plan Continue current medications  CKD, Stage III   Kidney Function Lab Results  Component Value Date/Time   CREATININE 1.92 (H) 08/03/2019 01:53 PM   CREATININE 2.00 (H) 03/18/2019 01:15 PM   CREATININE 1.98 (H) 04/06/2013 01:42 PM   GFR 40.54 (L) 08/03/2019 01:53 PM   GFRNONAA 30 (L) 03/18/2019 01:08 PM   GFRAA 35 (L) 03/18/2019 01:08 PM   Kidney function stable.   Plan Continue to monitor and adjust medications as needed.    Vaccines   Reviewed and discussed patient's vaccination history.    Immunization History  Administered Date(s) Administered  . Fluad Quad(high Dose 65+) 02/24/2019  . Influenza Split 03/22/2011, 04/01/2012  . Influenza Whole 02/18/2009, 04/13/2010  . Influenza, High Dose Seasonal PF 05/10/2014, 05/17/2015, 03/28/2016, 03/20/2017, 02/17/2018  . Influenza,inj,Quad PF,6+ Mos 02/16/2013  . Pneumococcal Conjugate-13 11/11/2013  . Pneumococcal Polysaccharide-23 05/18/2016  . Tdap 11/22/2011   Patient reported receiving COVID vaccine 2 dose series with [facility] on 2/13 & 07/22/19.   Plan Recommended patient receive Shingrix vaccine at pharmacy. Previously, patient reported it was too expensive but have not looked into cost this year. Plan to reach out to pharmacy to determine cost.  Medication Management  Patient organizes medications: spouse organizes medications. Uses pill box.  Primary pharmacy: [facility] order  Adherence: no gaps in refill history (per medication dispense history from 08/02/2019 to 01/28/2020)   Follow up Follow up  visit with PharmD in 3 months. Follow up BP assessment with CPA in 1 month.   [name], PharmD Clinical Pharmacist [facility]

## 2020-04-30 DIAGNOSIS — G4733 Obstructive sleep apnea (adult) (pediatric): Secondary | ICD-10-CM | POA: Diagnosis not present

## 2020-04-30 DIAGNOSIS — R063 Periodic breathing: Secondary | ICD-10-CM | POA: Diagnosis not present

## 2020-05-28 ENCOUNTER — Other Ambulatory Visit: Payer: Self-pay | Admitting: Family Medicine

## 2020-05-30 ENCOUNTER — Other Ambulatory Visit: Payer: Self-pay | Admitting: Family Medicine

## 2020-05-31 DIAGNOSIS — R063 Periodic breathing: Secondary | ICD-10-CM | POA: Diagnosis not present

## 2020-05-31 DIAGNOSIS — G4733 Obstructive sleep apnea (adult) (pediatric): Secondary | ICD-10-CM | POA: Diagnosis not present

## 2020-06-22 ENCOUNTER — Other Ambulatory Visit: Payer: Self-pay | Admitting: Cardiovascular Disease

## 2020-06-24 DIAGNOSIS — G4737 Central sleep apnea in conditions classified elsewhere: Secondary | ICD-10-CM | POA: Diagnosis not present

## 2020-06-24 DIAGNOSIS — R063 Periodic breathing: Secondary | ICD-10-CM | POA: Diagnosis not present

## 2020-07-01 DIAGNOSIS — G4733 Obstructive sleep apnea (adult) (pediatric): Secondary | ICD-10-CM | POA: Diagnosis not present

## 2020-07-01 DIAGNOSIS — R063 Periodic breathing: Secondary | ICD-10-CM | POA: Diagnosis not present

## 2020-07-05 ENCOUNTER — Other Ambulatory Visit: Payer: Self-pay | Admitting: Adult Health

## 2020-07-29 DIAGNOSIS — G4733 Obstructive sleep apnea (adult) (pediatric): Secondary | ICD-10-CM | POA: Diagnosis not present

## 2020-07-29 DIAGNOSIS — R063 Periodic breathing: Secondary | ICD-10-CM | POA: Diagnosis not present

## 2020-08-04 ENCOUNTER — Other Ambulatory Visit: Payer: Self-pay | Admitting: Adult Health

## 2020-08-05 DIAGNOSIS — R063 Periodic breathing: Secondary | ICD-10-CM | POA: Diagnosis not present

## 2020-08-05 DIAGNOSIS — G4737 Central sleep apnea in conditions classified elsewhere: Secondary | ICD-10-CM | POA: Diagnosis not present

## 2020-08-24 DIAGNOSIS — R063 Periodic breathing: Secondary | ICD-10-CM | POA: Diagnosis not present

## 2020-08-24 DIAGNOSIS — G4737 Central sleep apnea in conditions classified elsewhere: Secondary | ICD-10-CM | POA: Diagnosis not present

## 2020-08-29 DIAGNOSIS — G4733 Obstructive sleep apnea (adult) (pediatric): Secondary | ICD-10-CM | POA: Diagnosis not present

## 2020-08-29 DIAGNOSIS — R063 Periodic breathing: Secondary | ICD-10-CM | POA: Diagnosis not present

## 2020-08-31 DIAGNOSIS — C61 Malignant neoplasm of prostate: Secondary | ICD-10-CM | POA: Diagnosis not present

## 2020-09-05 DIAGNOSIS — R531 Weakness: Secondary | ICD-10-CM | POA: Diagnosis not present

## 2020-09-05 DIAGNOSIS — N183 Chronic kidney disease, stage 3 unspecified: Secondary | ICD-10-CM | POA: Diagnosis not present

## 2020-09-05 DIAGNOSIS — I509 Heart failure, unspecified: Secondary | ICD-10-CM | POA: Diagnosis not present

## 2020-09-05 DIAGNOSIS — C775 Secondary and unspecified malignant neoplasm of intrapelvic lymph nodes: Secondary | ICD-10-CM | POA: Diagnosis not present

## 2020-09-05 DIAGNOSIS — C7951 Secondary malignant neoplasm of bone: Secondary | ICD-10-CM | POA: Diagnosis not present

## 2020-09-05 DIAGNOSIS — C61 Malignant neoplasm of prostate: Secondary | ICD-10-CM | POA: Diagnosis not present

## 2020-09-06 ENCOUNTER — Other Ambulatory Visit: Payer: Self-pay

## 2020-09-06 ENCOUNTER — Ambulatory Visit (INDEPENDENT_AMBULATORY_CARE_PROVIDER_SITE_OTHER): Payer: Medicare HMO

## 2020-09-06 VITALS — BP 138/88 | HR 91 | Temp 98.2°F | Resp 98 | Wt 215.9 lb

## 2020-09-06 DIAGNOSIS — Z0000 Encounter for general adult medical examination without abnormal findings: Secondary | ICD-10-CM

## 2020-09-06 NOTE — Patient Instructions (Signed)
[name] , Thank you for taking time to come for your Medicare Wellness Visit. I appreciate your ongoing commitment to your health goals. Please review the following plan we discussed and let me know if I can assist you in the future.   Screening recommendations/referrals: Colonoscopy: No longer required  Recommended yearly ophthalmology/optometry visit for glaucoma screening and checkup Recommended yearly dental visit for hygiene and checkup  Vaccinations: Influenza vaccine: Due and discussed Pneumococcal vaccine: Up to date Tdap vaccine: Up to date Shingles vaccine: Shingrix discussed. Please contact your pharmacy for coverage information.    Covid-19: Completed 2/13, 3/10, & 03/13/20  Advanced directives: Please bring a copy of your health care power of attorney and living will to the office at your convenience.  Conditions/risks identified: Get to walking better  Next appointment: Follow up in one year for your annual wellness visit.   Preventive Care 20 Years and Older, Male Preventive care refers to lifestyle choices and visits with your health care provider that can promote health and wellness. What does preventive care include?  A yearly physical exam. This is also called an annual well check.  Dental exams once or twice a year.  Routine eye exams. Ask your health care provider how often you should have your eyes checked.  Personal lifestyle choices, including:  Daily care of your teeth and gums.  Regular physical activity.  Eating a healthy diet.  Avoiding tobacco and drug use.  Limiting alcohol use.  Practicing safe sex.  Taking low doses of aspirin every day.  Taking vitamin and mineral supplements as recommended by your health care provider. What happens during an annual well check? The services and screenings done by your health care provider during your annual well check will depend on your age, overall health, lifestyle risk factors, and family  history of disease. Counseling  Your health care provider may ask you questions about your:  Alcohol use.  Tobacco use.  Drug use.  Emotional well-being.  Home and relationship well-being.  Sexual activity.  Eating habits.  History of falls.  Memory and ability to understand (cognition).  Work and work Statistician. Screening  You may have the following tests or measurements:  Height, weight, and BMI.  Blood pressure.  Lipid and cholesterol levels. These may be checked every 5 years, or more frequently if you are over 6 years old.  Skin check.  Lung cancer screening. You may have this screening every year starting at age 74 if you have a 30-pack-year history of smoking and currently smoke or have quit within the past 15 years.  Fecal occult blood test (FOBT) of the stool. You may have this test every year starting at age 34.  Flexible sigmoidoscopy or colonoscopy. You may have a sigmoidoscopy every 5 years or a colonoscopy every 10 years starting at age 36.  Prostate cancer screening. Recommendations will vary depending on your family history and other risks.  Hepatitis C blood test.  Hepatitis B blood test.  Sexually transmitted disease (STD) testing.  Diabetes screening. This is done by checking your blood sugar (glucose) after you have not eaten for a while (fasting). You may have this done every 1-3 years.  Abdominal aortic aneurysm (AAA) screening. You may need this if you are a current or former smoker.  Osteoporosis. You may be screened starting at age 73 if you are at high risk. Talk with your health care provider about your test results, treatment options, and if necessary, the need for more tests.  Vaccines  Your health care provider may recommend certain vaccines, such as:  Influenza vaccine. This is recommended every year.  Tetanus, diphtheria, and acellular pertussis (Tdap, Td) vaccine. You may need a Td booster every 10 years.  Zoster vaccine.  You may need this after age 22.  Pneumococcal 13-valent conjugate (PCV13) vaccine. One dose is recommended after age 3.  Pneumococcal polysaccharide (PPSV23) vaccine. One dose is recommended after age 80. Talk to your health care provider about which screenings and vaccines you need and how often you need them. This information is not intended to replace advice given to you by your health care provider. Make sure you discuss any questions you have with your health care provider. Document Released: 05/27/2015 Document Revised: 01/18/2016 Document Reviewed: 03/01/2015 Elsevier Interactive Patient Education  2017 [place] Prevention in the Home Falls can cause injuries. They can happen to people of all ages. There are many things you can do to make your home safe and to help prevent falls. What can I do on the outside of my home?  Regularly fix the edges of walkways and driveways and fix any cracks.  Remove anything that might make you trip as you walk through a door, such as a raised step or threshold.  Trim any bushes or trees on the path to your home.  Use bright outdoor lighting.  Clear any walking paths of anything that might make someone trip, such as rocks or tools.  Regularly check to see if handrails are loose or broken. Make sure that both sides of any steps have handrails.  Any raised decks and porches should have guardrails on the edges.  Have any leaves, snow, or ice cleared regularly.  Use sand or salt on walking paths during winter.  Clean up any spills in your garage right away. This includes oil or grease spills. What can I do in the bathroom?  Use night lights.  Install grab bars by the toilet and in the tub and shower. Do not use towel bars as grab bars.  Use non-skid mats or decals in the tub or shower.  If you need to sit down in the shower, use a plastic, non-slip stool.  Keep the floor dry. Clean up any water that spills on the floor as soon  as it happens.  Remove soap buildup in the tub or shower regularly.  Attach bath mats securely with double-sided non-slip rug tape.  Do not have throw rugs and other things on the floor that can make you trip. What can I do in the bedroom?  Use night lights.  Make sure that you have a light by your bed that is easy to reach.  Do not use any sheets or blankets that are too big for your bed. They should not hang down onto the floor.  Have a firm chair that has side arms. You can use this for support while you get dressed.  Do not have throw rugs and other things on the floor that can make you trip. What can I do in the kitchen?  Clean up any spills right away.  Avoid walking on wet floors.  Keep items that you use a lot in easy-to-reach places.  If you need to reach something above you, use a strong step stool that has a grab bar.  Keep electrical cords out of the way.  Do not use floor polish or wax that makes floors slippery. If you must use wax, use non-skid floor wax.  Do not have throw rugs and other things on the floor that can make you trip. What can I do with my stairs?  Do not leave any items on the stairs.  Make sure that there are handrails on both sides of the stairs and use them. Fix handrails that are broken or loose. Make sure that handrails are as long as the stairways.  Check any carpeting to make sure that it is firmly attached to the stairs. Fix any carpet that is loose or worn.  Avoid having throw rugs at the top or bottom of the stairs. If you do have throw rugs, attach them to the floor with carpet tape.  Make sure that you have a light switch at the top of the stairs and the bottom of the stairs. If you do not have them, ask someone to add them for you. What else can I do to help prevent falls?  Wear shoes that:  Do not have high heels.  Have rubber bottoms.  Are comfortable and fit you well.  Are closed at the toe. Do not wear sandals.  If  you use a stepladder:  Make sure that it is fully opened. Do not climb a closed stepladder.  Make sure that both sides of the stepladder are locked into place.  Ask someone to hold it for you, if possible.  Clearly mark and make sure that you can see:  Any grab bars or handrails.  First and last steps.  Where the edge of each step is.  Use tools that help you move around (mobility aids) if they are needed. These include:  Canes.  Walkers.  Scooters.  Crutches.  Turn on the lights when you go into a dark area. Replace any light bulbs as soon as they burn out.  Set up your furniture so you have a clear path. Avoid moving your furniture around.  If any of your floors are uneven, fix them.  If there are any pets around you, be aware of where they are.  Review your medicines with your doctor. Some medicines can make you feel dizzy. This can increase your chance of falling. Ask your doctor what other things that you can do to help prevent falls. This information is not intended to replace advice given to you by your health care provider. Make sure you discuss any questions you have with your health care provider. Document Released: 02/24/2009 Document Revised: 10/06/2015 Document Reviewed: 06/04/2014 Elsevier Interactive Patient Education  2017 [employer].

## 2020-09-06 NOTE — Progress Notes (Signed)
Subjective:   NESTER BACHUS is a 87 y.o. male who presents for Medicare Annual/Subsequent preventive examination.  Review of Systems     Cardiac Risk Factors include: advanced age (>64men, >31 women);dyslipidemia;hypertension;male gender;obesity (BMI >30kg/m2)     Objective:    Today's Vitals   09/06/20 1103  BP: 138/88  Pulse: 91  Resp: (!) 98  Temp: 98.2 F (36.8 C)  Weight: 215 lb 14.4 oz (97.9 kg)   Body mass index is 30.11 kg/m.  Advanced Directives 09/06/2020 03/19/2019 03/18/2019 08/06/2013 04/14/2013 03/04/2013 02/27/2013  Does Patient Have a Medical Advance Directive? Yes - No Patient does not have advance directive;Patient would not like information Patient does not have advance directive Patient does not have advance directive Patient does not have advance directive;Patient would not like information  Type of Advance Directive Living will - - - - - -  Would patient like information on creating a medical advance directive? - No - Patient declined No - Patient declined - - - -  Pre-existing out of facility DNR order (yellow form or pink MOST form) - - - - No No -    Current Medications (verified) Outpatient Encounter Medications as of 09/06/2020  Medication Sig  . albuterol (VENTOLIN HFA) 108 (90 Base) MCG/ACT inhaler Inhale 2 puffs into the lungs every 6 (six) hours as needed for wheezing or shortness of breath.  Marland Kitchen amLODipine (NORVASC) 5 MG tablet TAKE 1 TABLET(5 MG) BY MOUTH DAILY  . Ascorbic Acid (VITAMIN C) 1000 MG tablet Take 1,000 mg by mouth daily.  Marland Kitchen aspirin EC 81 MG EC tablet Take 1 tablet (81 mg total) by mouth daily.  Marland Kitchen atorvastatin (LIPITOR) 40 MG tablet Take 1 tablet (40 mg total) by mouth daily at 6 PM. Please have primary care provide further refills.  . cholecalciferol (VITAMIN D3) 25 MCG (1000 UNIT) tablet Take 1,000 Units by mouth daily.  Marland Kitchen ELIQUIS 2.5 MG TABS tablet TAKE 1 TABLET(2.5 MG) BY MOUTH TWICE DAILY  . metoprolol tartrate (LOPRESSOR) 50  MG tablet TAKE 1 TABLET TWICE DAILY  . Multiple Vitamin (MULTIVITAMIN WITH MINERALS) TABS tablet Take 1 tablet by mouth daily.  [name] 40 MG capsule Take 80 mg by mouth daily.   Marland Kitchen FLUAD QUADRIVALENT 0.5 ML injection    No facility-administered encounter medications on file as of 09/06/2020.    Allergies (verified) Patient has no known allergies.   History: Past Medical History:  Diagnosis Date  . Disturbances of sulphur-bearing amino-acid metabolism   . Malignant neoplasm of prostate ([facility])   . Obstructive sleep apnea (adult) (pediatric) 2004   surgery to coorrect  . Osteoarthrosis, unspecified whether generalized or localized, unspecified site   . Other and unspecified hyperlipidemia   . Other specified cardiac dysrhythmias(427.89)   . 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 Paroxysmal atrial fibrillation (HCC)   . Unspecified cerebral artery occlusion with cerebral infarction   . Unspecified disorder resulting from impaired renal function   . Unspecified essential hypertension   . Wears glasses   . Wears partial dentures    upper partial   Past Surgical History:  Procedure Laterality Date  . APPLICATION OF A-CELL OF EXTREMITY Right 08/12/2013   Procedure: APPLICATION OF A-CELL OF EXTREMITY;  Surgeon: Theodoro Kos, DO;  Location: [facility];  Service: Plastics;  Laterality: Right;  . CHOLECYSTECTOMY  1987  . COLONOSCOPY    . ent surgery  2004   correct snoring  . INGUINAL HERNIA REPAIR  left  . INSERT / REPLACE / REMOVE PACEMAKER  04/14/2013   dual chamber   / Dr Sallyanne Kuster  . MINOR IRRIGATION AND DEBRIDEMENT OF WOUND Right 03/04/2013   Procedure: MINOR IRRIGATION AND DEBRIDEMENT OF WOUND;  Surgeon: Theodoro Kos, DO;  Location: [facility];  Service: Plastics;  Laterality: Right;  . ORCHIECTOMY  2002  . PERMANENT PACEMAKER INSERTION N/A 04/14/2013   Procedure: PERMANENT PACEMAKER INSERTION;  Surgeon: [name], MD;  Location:  [facility];  Service: Cardiovascular;  Laterality: N/A;  . SKIN SPLIT GRAFT Right 08/12/2013   Procedure: SKIN GRAFT SPLIT THICKNESS WITH PLACEMENT OF VAC TO RIGHT LOWER LEG/PLACEMENT OF ACELL TO RIGHT UPPER THIGH AREA (HARVEST SITE);  Surgeon: Theodoro Kos, DO;  Location: [facility];  Service: Plastics;  Laterality: Right;   Family History  Problem Relation Age of Onset  . Prostate cancer Father    Social History   Socioeconomic History  . Marital status: Married    Spouse name: Consulting civil engineer  . Number of children: Not on file  . Years of education: Not on file  . Highest education level: Not on file  Occupational History  . Occupation: Retired  . Occupation: RETIRED    Employer: RETIRED  Tobacco Use  . Smoking status: Current Some Day Smoker    Packs/day: 0.50    Years: 30.00    Pack years: 15.00    Types: Cigarettes  . Smokeless tobacco: Never Used  . Tobacco comment:  pt stated he smokes 1 pack every 2 wks  Vaping Use  . Vaping Use: Never used  Substance and Sexual Activity  . Alcohol use: No  . Drug use: No  . Sexual activity: Not on file    Comment: started smoking 2-3 cig daily  Other Topics Concern  . Not on file  Social History Narrative  . Not on file   Social Determinants of Health   Financial Resource Strain: Low Risk   . Difficulty of Paying Living Expenses: Not hard at all  Food Insecurity: No Food Insecurity  . Worried About Charity fundraiser in the Last Year: Never true  . Ran Out of Food in the Last Year: Never true  Transportation Needs: No Transportation Needs  . Lack of Transportation (Medical): No  . Lack of Transportation (Non-Medical): No  Physical Activity: Inactive  . Days of Exercise per Week: 0 days  . Minutes of Exercise per Session: 0 min  Stress: No Stress Concern Present  . Feeling of Stress : Not at all  Social Connections: Moderately Isolated  . Frequency of Communication with Friends and Family: Twice a week   . Frequency of Social Gatherings with Friends and Family: Never  . Attends Religious Services: More than 4 times per year  . Active Member of Clubs or Organizations: No  . Attends Archivist Meetings: Never  . Marital Status: Married    Tobacco Counseling Ready to quit: Not Answered Counseling given: Not Answered Comment:  pt stated he smokes 1 pack every 2 wks   Clinical Intake:  Pre-visit preparation completed: Yes  Pain : No/denies pain     BMI - recorded: 30.11 Nutritional Status: BMI > 30  Obese Nutritional Risks: None Diabetes: No  How often do you need to have someone help you when you read instructions, pamphlets, or other written materials from your doctor or pharmacy?: 1 - Never  Diabetic?No  Interpreter Needed?: No  Information entered by :: Otila Kluver  Carold Eisner, LPN   Activities of Daily Living In your present state of health, do you have any difficulty performing the following activities: 09/06/2020  Hearing? Y  Comment mild loss  Vision? N  Difficulty concentrating or making decisions? Y  Comment memory at times  Walking or climbing stairs? N  Dressing or bathing? N  Doing errands, shopping? N  Preparing Food and eating ? N  Using the Toilet? N  In the past six months, have you accidently leaked urine? N  Do you have problems with loss of bowel control? N  Managing your Medications? N  Managing your Finances? N  Housekeeping or managing your Housekeeping? N  Some recent data might be hidden    Patient Care Team: [name], MD as PCP - General (Family Medicine) Croitoru, Dani Gobble, MD as PCP - Cardiology (Cardiology) Viona Gilmore, [facility] as Pharmacist (Pharmacist)  Indicate any recent Medical Services you may have received from other than [facility] providers in the past year (date may be approximate).     Assessment:   This is a routine wellness examination for [place].  Hearing/Vision screen  Hearing Screening   125Hz  250Hz   500Hz  1000Hz  2000Hz  3000Hz  4000Hz  6000Hz  8000Hz   Right ear:           Left ear:           Comments: Pt states mild loss  Vision Screening Comments: Pt follows up with eye dr on [address] for annual exams   Dietary issues and exercise activities discussed: Current Exercise Habits: The patient does not participate in regular exercise at present  Goals    . Patient Stated     Improve walking     . Pharmacy Care Plan     CARE PLAN ENTRY  Current Barriers:  . Chronic Disease Management support, education, and care coordination needs related to Hypertension, Hyperlipidemia, Atrial Fibrillation, and Chronic Kidney Disease   Hypertension . Pharmacist Clinical Goal(s): o Over the next 90 days, patient will work with PharmD and providers to maintain BP goal <130/80 . Current regimen:   Amlodipine 5mg , 1 tablet once daily (night)   Metoprolol tartrate 50mg , 1 tablet twice daily  . Interventions: o Discussed proper techniques for home blood pressure monitoring.   . Patient self care activities - Over the next 90 days, patient will: o Check blood pressure twice daily, document, and provide at future appointments.  Hyperlipidemia . Pharmacist Clinical Goal(s): o Over the next 90 days, patient will work with PharmD and providers to achieve LDL goal < 70 . Current regimen:  o Atorvastatin 40mg , 1 tablet daily  . Interventions: o We discussed how a diet high in plant sterols (fruits/vegetables/nuts/whole grains/legumes) may reduce your cholesterol.  Encouraged increasing fiber to a daily intake of 10-25g/day  . Patient self care activities - Over the next 90 days, patient will: o Continue current medications o Schedule 6 month follow up with Dr. Elease Hashimoto and plan to check cholesterol labs  Atrial fibrillation . Pharmacist Clinical Goal(s) o Over the next 90 days, patient will work with PharmD and providers to Maintain normal sinus rhythm.  . Current regimen:  o Metoprolol tartrate  50mg , 1 tablet twice daily o apixaban (Eliquis) 2.5mg , 1 tablet twice daily  . Interventions: o We discussed:  monitoring for signs and symptoms for bleeding (coughing up blood, prolonged nose bleeds, black, tarry stools). . Patient self care activities - Over the next 90 days, patient will: o Continue current medications and monitor pulse.  o Bring financial documents for patient assistance application to cardiology appointment on 9/20.  Chronic Kidney Disease, stage 3  . Pharmacist Clinical Goal(s) o Over the next 90 days, patient will work with PharmD and providers to maintain kidney function  . Interventions: o Make sure to stay hydrate and avoid medications like NSAIDs.   Medication management . Pharmacist Clinical Goal(s): o Over the next 90 days, patient will work with PharmD and providers to maintain optimal medication adherence . Current pharmacy: [facility] order/ [facility]  . Interventions o Comprehensive medication review performed. o Continue current medication management strategy o For assistance for medication cost, consider applying to Extra Help by:  - Calling: [phone] - Website:  [email or web address]  . Patient self care activities - Over the next 90 days, patient will: o Take medications as prescribed o Report any questions or concerns to PharmD and/or provider(s)  Please see past updates related to this goal by clicking on the "Past Updates" button in the selected goal      . Quit Smoking     What are the options to eating;  One option would be to chew gum Another options would be computer        Depression Screen PHQ 2/9 Scores 09/06/2020 05/12/2019 07/31/2017 11/16/2016 05/17/2015 11/11/2013 12/31/2012  PHQ - 2 Score 0 0 0 0 0 0 0    Fall Risk Fall Risk  09/06/2020 08/03/2019 07/31/2017 11/16/2016 05/17/2015  Falls in the past year? 0 0 No No No  Number falls in past yr: 0 - - - -  Injury with Fall? 0 - - - -  Risk for fall due to : Impaired  vision - - - -  Follow up Falls prevention discussed Education provided - - -    FALL RISK PREVENTION PERTAINING TO THE HOME:  Any stairs in or around the home? Yes  If so, are there any without handrails? No  Home free of loose throw rugs in walkways, pet beds, electrical cords, etc? Yes  Adequate lighting in your home to reduce risk of falls? Yes   ASSISTIVE DEVICES UTILIZED TO PREVENT FALLS:  Life alert? No  Use of a cane, walker or w/c? No  Grab bars in the bathroom? No  Shower chair or bench in shower? No  Elevated toilet seat or a handicapped toilet? No   TIMED UP AND GO:  Was the test performed? Yes .  Length of time to ambulate 10 feet: 15 sec.   Gait slow and steady without use of assistive device  Cognitive Function:     6CIT Screen 09/06/2020  What Year? 0 points  What month? 0 points  Count back from 20 0 points  Months in reverse 0 points  Repeat phrase 10 points    Immunizations Immunization History  Administered Date(s) Administered  . Fluad Quad(high Dose 65+) 02/24/2019  . Influenza Split 03/22/2011, 04/01/2012  . Influenza Whole 02/18/2009, 04/13/2010  . Influenza, High Dose Seasonal PF 05/10/2014, 05/17/2015, 03/28/2016, 03/20/2017, 02/17/2018  . Influenza,inj,Quad PF,6+ Mos 02/16/2013  . PFIZER(Purple Top)SARS-COV-2 Vaccination 06/27/2019, 07/22/2019, 03/13/2020  . Pneumococcal Conjugate-13 11/11/2013  . Pneumococcal Polysaccharide-23 05/18/2016  . Tdap 11/22/2011    TDAP status: Up to date  Flu Vaccine status: Due, Education has been provided regarding the importance of this vaccine. Advised may receive this vaccine at local pharmacy or Health Dept. Aware to provide a copy of the vaccination record if obtained from local pharmacy or Health Dept. Verbalized acceptance and understanding.  Pneumococcal  vaccine status: Up to date  Covid-19 vaccine status: Completed vaccines  Qualifies for Shingles Vaccine? Yes   Zostavax completed No    Shingrix Completed?: No.    Education has been provided regarding the importance of this vaccine. Patient has been advised to call insurance company to determine out of pocket expense if they have not yet received this vaccine. Advised may also receive vaccine at local pharmacy or Health Dept. Verbalized acceptance and understanding.  Screening Tests Health Maintenance  Topic Date Due  . COVID-19 Vaccine (4 - Booster for Pfizer series) 09/10/2020  . INFLUENZA VACCINE  12/12/2020  . TETANUS/TDAP  11/21/2021  . PNA vac Low Risk Adult  Completed  . HPV VACCINES  Aged Out    Health Maintenance  Health Maintenance Due  Topic Date Due  . COVID-19 Vaccine (4 - Booster for Pfizer series) 09/10/2020    Colorectal cancer screening: No longer required.    Additional Screening:   Vision Screening: Recommended annual ophthalmology exams for early detection of glaucoma and other disorders of the eye. Is the patient up to date with their annual eye exam?  Yes  Who is the provider or what is the name of the office in which the patient attends annual eye exams? Dr Herbert Deaner  On [address]  If pt is not established with a provider, would they like to be referred to a provider to establish care? No .   Dental Screening: Recommended annual dental exams for proper oral hygiene  Community Resource Referral / Chronic Care Management: CRR required this visit?  No   CCM required this visit?  No      Plan:     I have personally reviewed and noted the following in the patient's chart:   . Medical and social history . Use of alcohol, tobacco or illicit drugs  . Current medications and supplements . Functional ability and status . Nutritional status . Physical activity . Advanced directives . List of other physicians . Hospitalizations, surgeries, and ER visits in previous 12 months . Vitals . Screenings to include cognitive, depression, and falls . Referrals and appointments  In addition, I  have reviewed and discussed with patient certain preventive protocols, quality metrics, and best practice recommendations. A written personalized care plan for preventive services as well as general preventive health recommendations were provided to patient.     [name], LPN   [id number]   Nurse Notes: None

## 2020-09-08 ENCOUNTER — Other Ambulatory Visit (HOSPITAL_COMMUNITY): Payer: Self-pay | Admitting: Urology

## 2020-09-08 ENCOUNTER — Other Ambulatory Visit: Payer: Self-pay | Admitting: Urology

## 2020-09-08 DIAGNOSIS — C7951 Secondary malignant neoplasm of bone: Secondary | ICD-10-CM

## 2020-09-08 DIAGNOSIS — R531 Weakness: Secondary | ICD-10-CM

## 2020-09-13 ENCOUNTER — Telehealth: Payer: Self-pay | Admitting: Pharmacist

## 2020-09-14 NOTE — Chronic Care Management (AMB) (Signed)
    Chronic Care Management Pharmacy Assistant   Name: [name]  MRN: [id number] DOB: 06/19/1932  Reason for Encounter: General Adherence Call   Recent office visits:  . 04.26.2022 [name], LPN Medicare Wellness Visit Recent consult visits:  . 09.20.2021 [name], MD Cardiology- patient present for pacemaker check and AF follow-up  Hospital visits:  None in previous 6 months  Medications: Outpatient Encounter Medications as of 09/13/2020  Medication Sig  . albuterol (VENTOLIN HFA) 108 (90 Base) MCG/ACT inhaler Inhale 2 puffs into the lungs every 6 (six) hours as needed for wheezing or shortness of breath.  Marland Kitchen amLODipine (NORVASC) 5 MG tablet TAKE 1 TABLET(5 MG) BY MOUTH DAILY  . Ascorbic Acid (VITAMIN C) 1000 MG tablet Take 1,000 mg by mouth daily.  Marland Kitchen aspirin EC 81 MG EC tablet Take 1 tablet (81 mg total) by mouth daily.  Marland Kitchen atorvastatin (LIPITOR) 40 MG tablet Take 1 tablet (40 mg total) by mouth daily at 6 PM. Please have primary care provide further refills.  . cholecalciferol (VITAMIN D3) 25 MCG (1000 UNIT) tablet Take 1,000 Units by mouth daily.  Marland Kitchen ELIQUIS 2.5 MG TABS tablet TAKE 1 TABLET(2.5 MG) BY MOUTH TWICE DAILY  . FLUAD QUADRIVALENT 0.5 ML injection   . metoprolol tartrate (LOPRESSOR) 50 MG tablet TAKE 1 TABLET TWICE DAILY  . Multiple Vitamin (MULTIVITAMIN WITH MINERALS) TABS tablet Take 1 tablet by mouth daily.  [name] 40 MG capsule Take 80 mg by mouth daily.    No facility-administered encounter medications on file as of 09/13/2020.   I spoke with the patient and his wife and discuss medication adherence. She states that he has been doing well. Unfortunately, he has not been recording his blood pressure. There have not been any changes in his medications. He is not seeing his PCP in over a year. A new appointment for his PCP was made for Oct 03, 2020, at 9:30 AM. A new CCM appointment was made for June 14th,2022 at 2:00 PM. He has not had any  emergency department visits or urgent care visits since his last CPP or PCP visit. He is not experiencing any issues with his pharmacy.  Star Rating Drugs:  Dispensed Quantity Pharmacy  Atorvastatin 40 mg 12.19.2021 [id number] [name], [place] [phone]

## 2020-09-28 DIAGNOSIS — R063 Periodic breathing: Secondary | ICD-10-CM | POA: Diagnosis not present

## 2020-09-28 DIAGNOSIS — G4733 Obstructive sleep apnea (adult) (pediatric): Secondary | ICD-10-CM | POA: Diagnosis not present

## 2020-10-03 ENCOUNTER — Other Ambulatory Visit: Payer: Self-pay

## 2020-10-03 ENCOUNTER — Ambulatory Visit (INDEPENDENT_AMBULATORY_CARE_PROVIDER_SITE_OTHER): Payer: Medicare HMO | Admitting: Family Medicine

## 2020-10-03 ENCOUNTER — Encounter: Payer: Self-pay | Admitting: Family Medicine

## 2020-10-03 VITALS — BP 138/88 | HR 88 | Temp 98.1°F | Ht 71.0 in | Wt 215.5 lb

## 2020-10-03 DIAGNOSIS — R739 Hyperglycemia, unspecified: Secondary | ICD-10-CM | POA: Diagnosis not present

## 2020-10-03 DIAGNOSIS — I10 Essential (primary) hypertension: Secondary | ICD-10-CM

## 2020-10-03 DIAGNOSIS — Z0000 Encounter for general adult medical examination without abnormal findings: Secondary | ICD-10-CM | POA: Diagnosis not present

## 2020-10-03 DIAGNOSIS — E7800 Pure hypercholesterolemia, unspecified: Secondary | ICD-10-CM | POA: Diagnosis not present

## 2020-10-03 DIAGNOSIS — F17200 Nicotine dependence, unspecified, uncomplicated: Secondary | ICD-10-CM

## 2020-10-03 LAB — BASIC METABOLIC PANEL
BUN: 21 mg/dL (ref 6–23)
CO2: 26 mEq/L (ref 19–32)
Calcium: 10.1 mg/dL (ref 8.4–10.5)
Chloride: 112 mEq/L (ref 96–112)
Creatinine, Ser: 2.03 mg/dL — ABNORMAL HIGH (ref 0.40–1.50)
GFR: 29.34 mL/min — ABNORMAL LOW (ref >60.00)
Glucose, Bld: 103 mg/dL — ABNORMAL HIGH (ref 70–99)
Potassium: 4.3 mEq/L (ref 3.5–5.1)
Sodium: 145 mEq/L (ref 135–145)

## 2020-10-03 LAB — HEPATIC FUNCTION PANEL
ALT: 10 U/L (ref 0–53)
AST: 14 U/L (ref 0–37)
Albumin: 3.7 g/dL (ref 3.5–5.2)
Alkaline Phosphatase: 126 U/L — ABNORMAL HIGH (ref 39–117)
Bilirubin, Direct: 0.1 mg/dL (ref 0.0–0.3)
Total Bilirubin: 0.5 mg/dL (ref 0.2–1.2)
Total Protein: 6.3 g/dL (ref 6.0–8.3)

## 2020-10-03 LAB — LIPID PANEL
Cholesterol: 122 mg/dL (ref 0–200)
HDL: 45.9 mg/dL (ref >39.00)
LDL Cholesterol: 55 mg/dL (ref 0–99)
NonHDL: 76.21
Total CHOL/HDL Ratio: 3
Triglycerides: 106 mg/dL (ref 0.0–149.0)
VLDL: 21.2 mg/dL (ref 0.0–40.0)

## 2020-10-03 LAB — HEMOGLOBIN A1C: Hgb A1c MFr Bld: 6.4 % (ref 4.6–6.5)
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
## 2020-10-03 NOTE — Patient Instructions (Signed)

## 2020-10-14 ENCOUNTER — Ambulatory Visit (HOSPITAL_COMMUNITY): Admission: RE | Admit: 2020-10-14 | Payer: Medicare HMO | Source: Ambulatory Visit

## 2020-10-24 ENCOUNTER — Telehealth: Payer: Self-pay | Admitting: Pharmacist

## 2020-10-24 NOTE — Chronic Care Management (AMB) (Signed)
    Chronic Care Management Pharmacy Assistant   Name: [name]  MRN: [id number] DOB: 03-13-1933  10/24/20 -  Called patient to remind of appointment with [name]) on (10/25/20 by phone at 2pm)  No answer, left message of appointment date, time and type of appointment (either telephone or in person). Left message to have all medications, supplements, blood pressure and/or blood sugar logs available during appointment and to return call if need to reschedule.  Star Rating Drug:  Atorvastatin 40mg  - last filled on 02/23/20 90DS at [facility].  Any gaps in medications fill history? Yes.   [name]  Clinical Pharmacist Assistant [phone]

## 2020-10-25 ENCOUNTER — Ambulatory Visit (INDEPENDENT_AMBULATORY_CARE_PROVIDER_SITE_OTHER): Payer: Medicare HMO | Admitting: Pharmacist

## 2020-10-25 DIAGNOSIS — I10 Essential (primary) hypertension: Secondary | ICD-10-CM | POA: Diagnosis not present

## 2020-10-25 DIAGNOSIS — I480 Paroxysmal atrial fibrillation: Secondary | ICD-10-CM | POA: Diagnosis not present
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
## 2020-10-27 ENCOUNTER — Other Ambulatory Visit: Payer: Self-pay

## 2020-10-27 MED ORDER — ATORVASTATIN CALCIUM 40 MG PO TABS: 40.0000 mg | ORAL_TABLET | Freq: Every day | ORAL | 0 refills | Status: DC

## 2020-10-27 MED ORDER — AMLODIPINE BESYLATE 5 MG PO TABS: ORAL_TABLET | ORAL | 1 refills | Status: DC

## 2020-10-31 ENCOUNTER — Other Ambulatory Visit: Payer: Self-pay

## 2020-11-02 ENCOUNTER — Ambulatory Visit: Payer: Medicare HMO | Admitting: Family Medicine

## 2020-11-02 DIAGNOSIS — Z0289 Encounter for other administrative examinations: Secondary | ICD-10-CM

## 2020-11-14 ENCOUNTER — Other Ambulatory Visit: Payer: Self-pay | Admitting: Cardiovascular Disease

## 2020-11-15 ENCOUNTER — Other Ambulatory Visit: Payer: Self-pay

## 2020-11-15 ENCOUNTER — Ambulatory Visit (HOSPITAL_COMMUNITY)
Admission: RE | Admit: 2020-11-15 | Discharge: 2020-11-15 | Disposition: A | Discharge status: Home / Self Care | Payer: Medicare HMO | Source: Ambulatory Visit | Attending: Urology | Admitting: Urology

## 2020-11-15 DIAGNOSIS — R531 Weakness: Secondary | ICD-10-CM | POA: Insufficient documentation

## 2020-11-15 DIAGNOSIS — M4807 Spinal stenosis, lumbosacral region: Secondary | ICD-10-CM | POA: Diagnosis not present

## 2020-11-15 DIAGNOSIS — M48061 Spinal stenosis, lumbar region without neurogenic claudication: Secondary | ICD-10-CM | POA: Diagnosis not present

## 2020-11-15 DIAGNOSIS — M5126 Other intervertebral disc displacement, lumbar region: Secondary | ICD-10-CM | POA: Diagnosis not present

## 2020-11-15 NOTE — Progress Notes (Signed)
Patient here today at [facility] for MRI lumbar spine WO contrast. Patient has a medtronic device. Carelink express sent to Mike-medtronic rep and Renee-Cardiology PA. Orders received for DOO 80, surescan on. Will re-program once scan is completed.

## 2020-11-16 ENCOUNTER — Telehealth: Payer: Self-pay | Admitting: Pharmacist

## 2020-11-16 NOTE — Chronic Care Management (AMB) (Signed)
    Chronic Care Management Pharmacy Assistant   Name: [name]  MRN: [id number] DOB: [birth year]   Completed [place] application for Eliquis per [place] pharmacist's instructions. Sent application to [employer] PTM to print and mail to patient. Patient is aware application will be mailed per [name].  [place]  Clinical Pharmacist Assistant [phone]  Medications: Outpatient Encounter Medications as of 11/16/2020  Medication Sig   albuterol (VENTOLIN HFA) 108 (90 Base) MCG/ACT inhaler Inhale 2 puffs into the lungs every 6 (six) hours as needed for wheezing or shortness of breath.   amLODipine (NORVASC) 5 MG tablet TAKE 1 TABLET(5 MG) BY MOUTH DAILY   Ascorbic Acid (VITAMIN C) 1000 MG tablet Take 1,000 mg by mouth daily.   aspirin EC 81 MG EC tablet Take 1 tablet (81 mg total) by mouth daily.   atorvastatin (LIPITOR) 40 MG tablet Take 1 tablet (40 mg total) by mouth daily at 6 PM. Please have primary care provide further refills.   cholecalciferol (VITAMIN D3) 25 MCG (1000 UNIT) tablet Take 1,000 Units by mouth daily.   ELIQUIS 2.5 MG TABS tablet TAKE 1 TABLET(2.5 MG) BY MOUTH TWICE DAILY   metoprolol tartrate (LOPRESSOR) 50 MG tablet TAKE 1 TABLET TWICE DAILY   Multiple Vitamin (MULTIVITAMIN WITH MINERALS) TABS tablet Take 1 tablet by mouth daily.   XTANDI 40 MG capsule Take 80 mg by mouth daily.    No facility-administered encounter medications on file as of 11/16/2020.   Notes: Spoke with [place] at [facility] and she states patients star rated medication atorvastatin was actually last filled on 11/02/20 for 90 day supply.  Star Rating Drugs:  Atorvastatin 40mg  - last filled on 02/23/20 90DS at [facility].  [place]  Clinical Pharmacist Assistant [phone]

## 2020-11-21 DIAGNOSIS — R269 Unspecified abnormalities of gait and mobility: Secondary | ICD-10-CM | POA: Diagnosis not present

## 2020-11-21 DIAGNOSIS — I10 Essential (primary) hypertension: Secondary | ICD-10-CM | POA: Diagnosis not present

## 2020-11-21 DIAGNOSIS — Z6828 Body mass index (BMI) 28.0-28.9, adult: Secondary | ICD-10-CM | POA: Diagnosis not present

## 2020-12-13 ENCOUNTER — Telehealth: Payer: Self-pay | Admitting: Pharmacist

## 2020-12-13 NOTE — Chronic Care Management (AMB) (Signed)
    Chronic Care Management Pharmacy Assistant   Name: [name]  MRN: [id number] DOB: [birth year]  Reason for Encounter: Disease State/ Hypertension Assessment Call   Conditions to be addressed/monitored: HTN  Recent office visits:  None  Recent consult visits:  None  Hospital visits:  None in previous 6 months  Medications: Outpatient Encounter Medications as of 12/13/2020  Medication Sig   albuterol (VENTOLIN HFA) 108 (90 Base) MCG/ACT inhaler Inhale 2 puffs into the lungs every 6 (six) hours as needed for wheezing or shortness of breath.   amLODipine (NORVASC) 5 MG tablet TAKE 1 TABLET(5 MG) BY MOUTH DAILY   Ascorbic Acid (VITAMIN C) 1000 MG tablet Take 1,000 mg by mouth daily.   aspirin EC 81 MG EC tablet Take 1 tablet (81 mg total) by mouth daily.   atorvastatin (LIPITOR) 40 MG tablet Take 1 tablet (40 mg total) by mouth daily at 6 PM. Please have primary care provide further refills.   cholecalciferol (VITAMIN D3) 25 MCG (1000 UNIT) tablet Take 1,000 Units by mouth daily.   ELIQUIS 2.5 MG TABS tablet TAKE 1 TABLET(2.5 MG) BY MOUTH TWICE DAILY   metoprolol tartrate (LOPRESSOR) 50 MG tablet TAKE 1 TABLET TWICE DAILY   Multiple Vitamin (MULTIVITAMIN WITH MINERALS) TABS tablet Take 1 tablet by mouth daily.   XTANDI 40 MG capsule Take 80 mg by mouth daily.    No facility-administered encounter medications on file as of 12/13/2020.   Reviewed chart prior to disease state call. Spoke with patient regarding BP  Recent Office Vitals: BP Readings from Last 3 Encounters:  10/03/20 138/88  09/06/20 138/88  02/01/20 127/89   Pulse Readings from Last 3 Encounters:  10/03/20 88  09/06/20 91  02/01/20 69    Wt Readings from Last 3 Encounters:  10/03/20 215 lb 8 oz (97.8 kg)  09/06/20 215 lb 14.4 oz (97.9 kg)  02/01/20 208 lb (94.3 kg)     Kidney Function Lab Results  Component Value Date/Time   CREATININE 2.03 (H) 10/03/2020 10:04 AM   CREATININE 1.92 (H)  08/03/2019 01:53 PM   CREATININE 1.98 (H) 04/06/2013 01:42 PM   GFR 29.34 (L) 10/03/2020 10:04 AM   GFRNONAA 30 (L) 03/18/2019 01:08 PM   GFRAA 35 (L) 03/18/2019 01:08 PM    BMP Latest Ref Rng & Units 10/03/2020 08/03/2019 03/18/2019  Glucose 70 - 99 mg/dL 103(H) 94 110(H)  BUN 6 - 23 mg/dL 21 25(H) 22  Creatinine 0.40 - 1.50 mg/dL 2.03(H) 1.92(H) 2.00(H)  Sodium 135 - 145 mEq/L 145 143 146(H)  Potassium 3.5 - 5.1 mEq/L 4.3 4.5 4.0  Chloride 96 - 112 mEq/L 112 110 112(H)  CO2 19 - 32 mEq/L 26 27 -  Calcium 8.4 - 10.5 mg/dL 10.1 10.0 -    Current antihypertensive regimen:  Metoprolol tartrate '50mg'$ , 1 tablet twice daily Amlodipine '5mg'$ , 1 tablet once daily (night)   Fill History Gaps :  atorvastatin (LIPITOR) tablet 05/01/2020 90    Care Gaps:  CCM follow up call - Scheduled 04-29-21 1pm  13-13-22 2pm AWV - Scheduled 09-12-21 11a, Zoster Vaccine - Overdue Flu Vaccine - Overdue  Star Rating Drugs:  Atorvastatin '40mg'$  - Last filled 05-01-20 90DS  Notes: 12-15-2020 3rd attempt to reach patient on today not successful   [place] Clinical Pharmacist Assistant [phone]

## 2020-12-19 ENCOUNTER — Other Ambulatory Visit: Payer: Self-pay | Admitting: Cardiovascular Disease

## 2020-12-19 NOTE — Telephone Encounter (Signed)
Prescription refill request for Eliquis received. Indication:atrial fib Last office visit:9/21 Scr:2.0 Age: 87 Weight:97.8 kg  Prescription refilled

## 2021-01-03 ENCOUNTER — Other Ambulatory Visit: Payer: Self-pay | Admitting: Cardiovascular Disease

## 2021-01-04 ENCOUNTER — Telehealth: Payer: Self-pay | Admitting: Pharmacist

## 2021-01-04 NOTE — Progress Notes (Signed)
    Chronic Care Management Pharmacy Assistant   Name: [name]  MRN: [id number] DOB: 10-16-1932   Reason for Encounter: Patient Assistance Documentation :Eliquis  Call to [facility] to check on the status of his application they advised there was none on file for him. Left msg with patient to see if he ever did receive the copy that was sent out to him. Will  mail him another  Eliquis application with instructions to mail back his completed portions to Dr Sallyanne Kuster  for further review.  Medications: Outpatient Encounter Medications as of 01/04/2021  Medication Sig   albuterol (VENTOLIN HFA) 108 (90 Base) MCG/ACT inhaler Inhale 2 puffs into the lungs every 6 (six) hours as needed for wheezing or shortness of breath.   amLODipine (NORVASC) 5 MG tablet TAKE 1 TABLET(5 MG) BY MOUTH DAILY   apixaban (ELIQUIS) 2.5 MG TABS tablet TAKE 1 TABLET(2.5 MG) BY MOUTH TWICE DAILY   Ascorbic Acid (VITAMIN C) 1000 MG tablet Take 1,000 mg by mouth daily.   aspirin EC 81 MG EC tablet Take 1 tablet (81 mg total) by mouth daily.   atorvastatin (LIPITOR) 40 MG tablet TAKE 1 TABLET EVERY DAY  AT  6PM   cholecalciferol (VITAMIN D3) 25 MCG (1000 UNIT) tablet Take 1,000 Units by mouth daily.   metoprolol tartrate (LOPRESSOR) 50 MG tablet TAKE 1 TABLET TWICE DAILY   Multiple Vitamin (MULTIVITAMIN WITH MINERALS) TABS tablet Take 1 tablet by mouth daily.   XTANDI 40 MG capsule Take 80 mg by mouth daily.    No facility-administered encounter medications on file as of 01/04/2021.    Care Gaps: CCM follow up call - Scheduled 04-29-21 1pm  13-13-22 2pm AWV - Scheduled 09-12-21 11a, Zoster Vaccine - Overdue Flu Vaccine - Overdue   Star Rating Drugs: Atorvastatin '40mg'$  - Last filled 11-02-20 [place] Clinical Pharmacist Assistant [phone]

## 2021-01-17 ENCOUNTER — Other Ambulatory Visit: Payer: Self-pay | Admitting: Cardiovascular Disease

## 2021-01-26 ENCOUNTER — Telehealth: Payer: Self-pay | Admitting: Pharmacist

## 2021-01-26 NOTE — Chronic Care Management (AMB) (Signed)
Chronic Care Management Pharmacy Assistant   Name: [name]  MRN: [id number] DOB: 19-Jul-1932   Reason for Encounter: Disease State/ Hypertension Assessment Call    Conditions to be addressed/monitored: HTN   Recent office visits:  none  Recent consult visits:  none  Hospital visits:  None in previous 6 months  Medications: Outpatient Encounter Medications as of 01/26/2021  Medication Sig   albuterol (VENTOLIN HFA) 108 (90 Base) MCG/ACT inhaler Inhale 2 puffs into the lungs every 6 (six) hours as needed for wheezing or shortness of breath.   amLODipine (NORVASC) 5 MG tablet TAKE 1 TABLET EVERY DAY   apixaban (ELIQUIS) 2.5 MG TABS tablet TAKE 1 TABLET(2.5 MG) BY MOUTH TWICE DAILY   Ascorbic Acid (VITAMIN C) 1000 MG tablet Take 1,000 mg by mouth daily.   aspirin EC 81 MG EC tablet Take 1 tablet (81 mg total) by mouth daily.   atorvastatin (LIPITOR) 40 MG tablet TAKE 1 TABLET EVERY DAY  AT  6PM   cholecalciferol (VITAMIN D3) 25 MCG (1000 UNIT) tablet Take 1,000 Units by mouth daily.   metoprolol tartrate (LOPRESSOR) 50 MG tablet TAKE 1 TABLET TWICE DAILY   Multiple Vitamin (MULTIVITAMIN WITH MINERALS) TABS tablet Take 1 tablet by mouth daily.   XTANDI 40 MG capsule Take 80 mg by mouth daily.    No facility-administered encounter medications on file as of 01/26/2021.  Reviewed chart prior to disease state call. Spoke with patient regarding BP  Recent Office Vitals: BP Readings from Last 3 Encounters:  10/03/20 138/88  09/06/20 138/88  02/01/20 127/89   Pulse Readings from Last 3 Encounters:  10/03/20 88  09/06/20 91  02/01/20 69    Wt Readings from Last 3 Encounters:  10/03/20 215 lb 8 oz (97.8 kg)  09/06/20 215 lb 14.4 oz (97.9 kg)  02/01/20 208 lb (94.3 kg)     Kidney Function Lab Results  Component Value Date/Time   CREATININE 2.03 (H) 10/03/2020 10:04 AM   CREATININE 1.92 (H) 08/03/2019 01:53 PM   CREATININE 1.98 (H) 04/06/2013 01:42 PM   GFR  29.34 (L) 10/03/2020 10:04 AM   GFRNONAA 30 (L) 03/18/2019 01:08 PM   GFRAA 35 (L) 03/18/2019 01:08 PM    BMP Latest Ref Rng & Units 10/03/2020 08/03/2019 03/18/2019  Glucose 70 - 99 mg/dL 103(H) 94 110(H)  BUN 6 - 23 mg/dL 21 25(H) 22  Creatinine 0.40 - 1.50 mg/dL 2.03(H) 1.92(H) 2.00(H)  Sodium 135 - 145 mEq/L 145 143 146(H)  Potassium 3.5 - 5.1 mEq/L 4.3 4.5 4.0  Chloride 96 - 112 mEq/L 112 110 112(H)  CO2 19 - 32 mEq/L 26 27 -  Calcium 8.4 - 10.5 mg/dL 10.1 10.0 -   Spoke to Wife during the call   Current antihypertensive regimen:  Metoprolol tartrate '50mg'$ , 1 tablet twice daily Amlodipine '5mg'$ , 1 tablet once daily (night) How often are you checking your Blood Pressure? Per patient every other day Current home BP readings: Wife reports he has been running 160/80 or in that range. Last weekend had gotten as high as 180/90 wife reports they are going through some difficulties with his children, son was in the hospital and daughter dealing with CHF. She also reports he had been off his XTANDI for some time and just started taking it 2 weeks ago again as the pharmacy had been out of stock of the medication. Asked that she check it for me while I was on the phone, she reports that he  had just waken up and had taken his medication less than an hour ago the reading as 146/94 she waited a few min and rechecked was 158/95 she will check later this eve and call me back with the reading. What recent interventions/DTPs have been made by any provider to improve Blood Pressure control since last CPP Visit: Wife reports none Any recent hospitalizations or ED visits since last visit with CPP? Wife reports none What diet changes have been made to improve Blood Pressure Control?  Wife reports he is not eating much processed foods, they like popcorn and for breakfast will have steak egg and cheese from [employer]. Other meals are prepared at home and not using any salt. What exercise is being done to improve  your Blood Pressure Control?  Wife reports he is not getting in much physical activity, she reports he was supposed to have PT/OT scheduled from his neurologist but never heard anything about it and was not contacted to start. She also reports he has a bulging disc but it does not cause him pain.  Adherence Review: Is the patient currently on ACE/ARB medication? No Does the patient have >5 day gap between last estimated fill dates? No  Notes: Patient's wife noted that he is often complaining of being cold and she states his hands and feet are always cold to touch to her. She reports she also did get the eliquis application that was resent to them and she is in the process of gathering his financial information required and will send it off afterwards.    Care Gaps: CCM follow up call - Scheduled 04-29-21 1pm  13-13-22 2pm AWV - Scheduled 09-12-21 11a, Zoster Vaccine - Overdue Flu Vaccine - Overdue  Star Rating Drugs: Atorvastatin '40mg'$  - Last filled 11-02-2020 [facility] Clinical Pharmacist Assistant [phone]

## 2021-02-17 DIAGNOSIS — C7951 Secondary malignant neoplasm of bone: Secondary | ICD-10-CM | POA: Diagnosis not present

## 2021-02-17 DIAGNOSIS — C775 Secondary and unspecified malignant neoplasm of intrapelvic lymph nodes: Secondary | ICD-10-CM | POA: Diagnosis not present

## 2021-02-17 DIAGNOSIS — C61 Malignant neoplasm of prostate: Secondary | ICD-10-CM | POA: Diagnosis not present

## 2021-03-09 ENCOUNTER — Other Ambulatory Visit: Payer: Self-pay

## 2021-03-10 ENCOUNTER — Ambulatory Visit (INDEPENDENT_AMBULATORY_CARE_PROVIDER_SITE_OTHER): Payer: Medicare HMO | Admitting: Family Medicine

## 2021-03-10 VITALS — BP 140/80 | HR 85 | Temp 98.7°F | Wt 207.7 lb

## 2021-03-10 DIAGNOSIS — R059 Cough, unspecified: Secondary | ICD-10-CM

## 2021-03-10 MED ORDER — PREDNISONE 20 MG PO TABS: ORAL_TABLET | ORAL | 0 refills | Status: DC

## 2021-03-10 MED ORDER — CEFUROXIME AXETIL 250 MG PO TABS: 250.0000 mg | ORAL_TABLET | Freq: Two times a day (BID) | ORAL | 0 refills | Status: AC

## 2021-03-10 MED ORDER — ALBUTEROL SULFATE HFA 108 (90 BASE) MCG/ACT IN AERS: 2.0000 | INHALATION_SPRAY | Freq: Four times a day (QID) | RESPIRATORY_TRACT | 3 refills | Status: AC | PRN

## 2021-03-10 NOTE — Progress Notes (Signed)
Established Patient Office Visit  Subjective:  Patient ID: [name], male    DOB: 1933-03-23  Age: 87 y.o. MRN: [id number]  CC:  Chief Complaint  Patient presents with   Cough    Productive cough, head cold, chest congestion.    HPI [name] presents for upper respiratory symptoms.  Started about a week ago.  Really does not have much in the way of nasal congestion but does have some loose cough.  Cough productive of thick yellow sputum.  No hemoptysis.  No dyspnea.  No fevers or chills.  Has had flu vaccine.  No sick contacts.  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   Paroxysmal atrial fibrillation ([place])    Unspecified cerebral artery occlusion with cerebral infarction    Unspecified disorder resulting from impaired renal function    Unspecified essential hypertension    Wears glasses    Wears partial dentures    upper partial    Past Surgical History:  Procedure Laterality Date   APPLICATION OF A-CELL OF EXTREMITY Right 08/12/2013   Procedure: APPLICATION OF A-CELL OF EXTREMITY;  Surgeon: Theodoro Kos, DO;  Location: [facility];  Service: Plastics;  Laterality: Right;   CHOLECYSTECTOMY  1987   COLONOSCOPY     ent surgery  2004   correct snoring   INGUINAL HERNIA REPAIR     left   INSERT / REPLACE / REMOVE PACEMAKER  04/14/2013   dual chamber   / Dr Sallyanne Kuster   MINOR IRRIGATION AND DEBRIDEMENT OF WOUND Right 03/04/2013   Procedure: MINOR IRRIGATION AND DEBRIDEMENT OF WOUND;  Surgeon: Theodoro Kos, DO;  Location: [facility];  Service:  Plastics;  Laterality: Right;   ORCHIECTOMY  2002   PERMANENT PACEMAKER INSERTION N/A 04/14/2013   Procedure: PERMANENT PACEMAKER INSERTION;  Surgeon: [name], MD;  Location: [facility];  Service: Cardiovascular;  Laterality: N/A;   SKIN SPLIT GRAFT Right 08/12/2013   Procedure: SKIN GRAFT SPLIT THICKNESS WITH PLACEMENT OF VAC TO RIGHT LOWER LEG/PLACEMENT OF ACELL TO RIGHT UPPER THIGH AREA (HARVEST SITE);  Surgeon: Theodoro Kos, DO;  Location: [facility];  Service: Plastics;  Laterality: Right;    Family History  Problem Relation Age of Onset   Prostate cancer Father    Cancer Father        prostate cancer   Cancer Sister        ovarian cancer    Social History   Socioeconomic History   Marital status: Married    Spouse name: Consulting civil engineer   Number of children: Not on file   Years of education: Not on file   Highest education level: Not on file  Occupational History   Occupation: Retired   Occupation: RETIRED    Employer: RETIRED  Tobacco Use   Smoking status: Some Days    Packs/day: 0.50    Years: 30.00    Pack years: 15.00    Types: Cigarettes   Smokeless tobacco: Never   Tobacco comments:     pt stated he smokes 1 pack every 2 wks  Vaping Use  Vaping Use: Never used  Substance and Sexual Activity   Alcohol use: No   Drug use: No   Sexual activity: Not on file    Comment: started smoking 2-3 cig daily  Other Topics Concern   Not on file  Social History Narrative   Not on file   Social Determinants of Health   Financial Resource Strain: Low Risk    Difficulty of Paying Living Expenses: Not hard at all  Food Insecurity: No Food Insecurity   Worried About Charity fundraiser in the Last Year: Never true   [place] in the Last Year: Never true  Transportation Needs: No Transportation Needs   Lack of Transportation (Medical): No   Lack of Transportation (Non-Medical): No  Physical Activity: Inactive   Days of Exercise per Week: 0  days   Minutes of Exercise per Session: 0 min  Stress: No Stress Concern Present   Feeling of Stress : Not at all  Social Connections: Moderately Isolated   Frequency of Communication with Friends and Family: Twice a week   Frequency of Social Gatherings with Friends and Family: Never   Attends Religious Services: More than 4 times per year   Active Member of [employer] or Organizations: No   Attends Music therapist: Never   Marital Status: Married  Human resources officer Violence: Not At Risk   Fear of Current or Ex-Partner: No   Emotionally Abused: No   Physically Abused: No   Sexually Abused: No    Outpatient Medications Prior to Visit  Medication Sig Dispense Refill   amLODipine (NORVASC) 5 MG tablet TAKE 1 TABLET EVERY DAY 60 tablet 1   apixaban (ELIQUIS) 2.5 MG TABS tablet TAKE 1 TABLET(2.5 MG) BY MOUTH TWICE DAILY 180 tablet 1   Ascorbic Acid (VITAMIN C) 1000 MG tablet Take 1,000 mg by mouth daily.     aspirin EC 81 MG EC tablet Take 1 tablet (81 mg total) by mouth daily. 30 tablet 0   atorvastatin (LIPITOR) 40 MG tablet TAKE 1 TABLET EVERY DAY  AT  6PM 90 tablet 0   cholecalciferol (VITAMIN D3) 25 MCG (1000 UNIT) tablet Take 1,000 Units by mouth daily.     metoprolol tartrate (LOPRESSOR) 50 MG tablet TAKE 1 TABLET TWICE DAILY 180 tablet 1   Multiple Vitamin (MULTIVITAMIN WITH MINERALS) TABS tablet Take 1 tablet by mouth daily.     XTANDI 40 MG capsule Take 80 mg by mouth daily.      albuterol (VENTOLIN HFA) 108 (90 Base) MCG/ACT inhaler Inhale 2 puffs into the lungs every 6 (six) hours as needed for wheezing or shortness of breath. 18 g 3   No facility-administered medications prior to visit.    No Known Allergies  ROS Review of Systems  Constitutional:  Negative for chills and fever.  HENT:  Negative for sinus pain.   Respiratory:  Positive for cough and wheezing.   Cardiovascular:  Negative for chest pain and leg swelling.     Objective:    Physical  Exam Vitals reviewed.  Cardiovascular:     Rate and Rhythm: Normal rate.  Pulmonary:     Effort: Pulmonary effort is normal.     Comments: Somewhat diminished breath sounds throughout.  No rales.  No retractions.  Pulse oximetry 98% Neurological:     Mental Status: He is alert.    BP 140/80 (BP Location: Left Arm, Patient Position: Sitting, Cuff Size: Normal)   Pulse 85   Temp  98.7 F (37.1 C) (Oral)   Wt 207 lb 11.2 oz (94.2 kg)   SpO2 98%   BMI 28.97 kg/m  Wt Readings from Last 3 Encounters:  03/10/21 207 lb 11.2 oz (94.2 kg)  10/03/20 215 lb 8 oz (97.8 kg)  09/06/20 215 lb 14.4 oz (97.9 kg)     Health Maintenance Due  Topic Date Due   Zoster Vaccines- Shingrix (1 of 2) Never done   COVID-19 Vaccine (5 - Booster for Pfizer series) 11/27/2020   INFLUENZA VACCINE  12/12/2020    There are no preventive care reminders to display for this patient.  Lab Results  Component Value Date   TSH 1.77 08/03/2019   Lab Results  Component Value Date   WBC 9.9 03/18/2019   HGB 13.9 03/18/2019   HCT 41.0 03/18/2019   MCV 96.0 03/18/2019   PLT 184 03/18/2019   Lab Results  Component Value Date   NA 145 10/03/2020   K 4.3 10/03/2020   CO2 26 10/03/2020   GLUCOSE 103 (H) 10/03/2020   BUN 21 10/03/2020   CREATININE 2.03 (H) 10/03/2020   BILITOT 0.5 10/03/2020   ALKPHOS 126 (H) 10/03/2020   AST 14 10/03/2020   ALT 10 10/03/2020   PROT 6.3 10/03/2020   ALBUMIN 3.7 10/03/2020   CALCIUM 10.1 10/03/2020   ANIONGAP 12 03/18/2019   GFR 29.34 (L) 10/03/2020   Lab Results  Component Value Date   CHOL 122 10/03/2020   Lab Results  Component Value Date   HDL 45.90 10/03/2020   Lab Results  Component Value Date   LDLCALC 55 10/03/2020   Lab Results  Component Value Date   TRIG 106.0 10/03/2020   Lab Results  Component Value Date   CHOLHDL 3 10/03/2020   Lab Results  Component Value Date   HGBA1C 6.4 10/03/2020      Assessment & Plan:   Cough.  Suspect  acute upper respiratory bronchial infection.  Patient does have increased risk because of his age at 39 and ongoing nicotine use.  He is in no respiratory distress at this time.  Flu vaccine already given.  -Follow-up promptly for any increased dyspnea, fever or for any persistent cough -Refilled albuterol inhaler to use as needed every 4-6 hours -Prednisone 20 mg take 2 tablets once daily for 5 days -Ceftin 250 mg 1 p.o. twice daily for 7 days  Meds ordered this encounter  Medications   albuterol (VENTOLIN HFA) 108 (90 Base) MCG/ACT inhaler    Sig: Inhale 2 puffs into the lungs every 6 (six) hours as needed for wheezing or shortness of breath.    Dispense:  18 g    Refill:  3   cefUROXime (CEFTIN) 250 MG tablet    Sig: Take 1 tablet (250 mg total) by mouth 2 (two) times daily with a meal for 10 days.    Dispense:  14 tablet    Refill:  0   predniSONE (DELTASONE) 20 MG tablet    Sig: Take two tablets once daily for 5 days    Dispense:  10 tablet    Refill:  0    Follow-up: No follow-ups on file.    Carolann Littler, MD

## 2021-04-13 ENCOUNTER — Encounter (HOSPITAL_BASED_OUTPATIENT_CLINIC_OR_DEPARTMENT_OTHER): Payer: Self-pay | Admitting: Obstetrics and Gynecology

## 2021-04-13 ENCOUNTER — Emergency Department (HOSPITAL_BASED_OUTPATIENT_CLINIC_OR_DEPARTMENT_OTHER): Payer: Medicare HMO | Admitting: Radiology

## 2021-04-13 ENCOUNTER — Other Ambulatory Visit: Payer: Self-pay

## 2021-04-13 ENCOUNTER — Emergency Department (HOSPITAL_BASED_OUTPATIENT_CLINIC_OR_DEPARTMENT_OTHER)
Admission: EM | Admit: 2021-04-13 | Discharge: 2021-04-13 | Disposition: A | Discharge status: Home / Self Care | Payer: Medicare HMO | Attending: Emergency Medicine | Admitting: Emergency Medicine

## 2021-04-13 ENCOUNTER — Emergency Department (HOSPITAL_BASED_OUTPATIENT_CLINIC_OR_DEPARTMENT_OTHER): Payer: Medicare HMO

## 2021-04-13 DIAGNOSIS — T148XXA Other injury of unspecified body region, initial encounter: Secondary | ICD-10-CM

## 2021-04-13 DIAGNOSIS — W19XXXA Unspecified fall, initial encounter: Secondary | ICD-10-CM | POA: Diagnosis not present

## 2021-04-13 DIAGNOSIS — Z8546 Personal history of malignant neoplasm of prostate: Secondary | ICD-10-CM | POA: Diagnosis not present

## 2021-04-13 DIAGNOSIS — N183 Chronic kidney disease, stage 3 unspecified: Secondary | ICD-10-CM | POA: Insufficient documentation

## 2021-04-13 DIAGNOSIS — I480 Paroxysmal atrial fibrillation: Secondary | ICD-10-CM | POA: Insufficient documentation

## 2021-04-13 DIAGNOSIS — Z23 Encounter for immunization: Secondary | ICD-10-CM | POA: Diagnosis not present

## 2021-04-13 DIAGNOSIS — S0990XA Unspecified injury of head, initial encounter: Secondary | ICD-10-CM | POA: Diagnosis not present

## 2021-04-13 DIAGNOSIS — S0031XA Abrasion of nose, initial encounter: Secondary | ICD-10-CM | POA: Diagnosis not present

## 2021-04-13 DIAGNOSIS — S0083XA Contusion of other part of head, initial encounter: Secondary | ICD-10-CM | POA: Diagnosis not present

## 2021-04-13 DIAGNOSIS — M25511 Pain in right shoulder: Secondary | ICD-10-CM | POA: Diagnosis not present

## 2021-04-13 DIAGNOSIS — S0003XA Contusion of scalp, initial encounter: Secondary | ICD-10-CM | POA: Diagnosis not present

## 2021-04-13 DIAGNOSIS — Z79899 Other long term (current) drug therapy: Secondary | ICD-10-CM | POA: Insufficient documentation

## 2021-04-13 DIAGNOSIS — Z950 Presence of cardiac pacemaker: Secondary | ICD-10-CM | POA: Diagnosis not present

## 2021-04-13 DIAGNOSIS — F17210 Nicotine dependence, cigarettes, uncomplicated: Secondary | ICD-10-CM | POA: Diagnosis not present

## 2021-04-13 DIAGNOSIS — S00531A Contusion of lip, initial encounter: Secondary | ICD-10-CM | POA: Diagnosis not present

## 2021-04-13 DIAGNOSIS — I129 Hypertensive chronic kidney disease with stage 1 through stage 4 chronic kidney disease, or unspecified chronic kidney disease: Secondary | ICD-10-CM | POA: Diagnosis not present

## 2021-04-13 DIAGNOSIS — Z7982 Long term (current) use of aspirin: Secondary | ICD-10-CM | POA: Diagnosis not present

## 2021-04-13 DIAGNOSIS — M25562 Pain in left knee: Secondary | ICD-10-CM | POA: Diagnosis not present

## 2021-04-13 DIAGNOSIS — M7981 Nontraumatic hematoma of soft tissue: Secondary | ICD-10-CM | POA: Diagnosis not present

## 2021-04-13 MED ORDER — ACETAMINOPHEN 325 MG PO TABS: 650.0000 mg | ORAL_TABLET | Freq: Four times a day (QID) | ORAL | 0 refills | Status: AC | PRN

## 2021-04-13 MED ORDER — ACETAMINOPHEN 325 MG PO TABS: 650.0000 mg | ORAL_TABLET | Freq: Four times a day (QID) | ORAL | 0 refills | Status: DC | PRN

## 2021-04-13 MED ORDER — ACETAMINOPHEN 325 MG PO TABS
650.0000 mg | ORAL_TABLET | Freq: Once | ORAL | Status: AC
  Administered 2021-04-13: 650 mg via ORAL
  Filled 2021-04-13: qty 2

## 2021-04-13 MED ORDER — METHOCARBAMOL 500 MG PO TABS: 500.0000 mg | ORAL_TABLET | Freq: Two times a day (BID) | ORAL | 0 refills | Status: DC

## 2021-04-13 MED ORDER — TETANUS-DIPHTH-ACELL PERTUSSIS 5-2.5-18.5 LF-MCG/0.5 IM SUSY
0.5000 mL | PREFILLED_SYRINGE | Freq: Once | INTRAMUSCULAR | Status: AC
  Administered 2021-04-13: 0.5 mL via INTRAMUSCULAR
  Filled 2021-04-13: qty 0.5

## 2021-04-13 MED ORDER — METHOCARBAMOL 500 MG PO TABS: 500.0000 mg | ORAL_TABLET | Freq: Two times a day (BID) | ORAL | 0 refills | Status: AC

## 2021-04-13 NOTE — ED Notes (Signed)
Pt verbalizes understanding of discharge instructions. Opportunity for questioning and answers were provided. Pt discharged from ED to home. Pt ambulatory to wheelchair and wheeled out to discharge desk

## 2021-04-13 NOTE — ED Triage Notes (Signed)
Patient reports to the ER for fall. Patient reports his right shoulder hurts as does his head. Denies LOC. Patient denies blood thinners, reports that his knee "went out" causing him to fall.

## 2021-04-13 NOTE — Discharge Instructions (Addendum)
Based on the events which brought you to the ER today, it is possible that you may have a concussion. A concussion occurs when there is a blow to the head or body, with enough force to shake the brain and disrupt how the brain functions. You may experience symptoms such as headaches, sensitivity to light/noise, dizziness, cognitive slowing, difficulty concentrating / remembering, trouble sleeping and drowsiness. These symptoms may last anywhere from hours/days to potentially weeks/months. While these symptoms are very frustrating and perhaps debilitating, it is important that you remember that they will improve over time. Everyone has a different rate of recovery; it is difficult to predict when your symptoms will resolve. In order to allow for your brain to heal after the injury, we recommend that you see your primary physician or a physician knowledgeable in concussion management. We also advise you to let your body and brain rest: avoid physical activities (sports, gym, and exercise) and reduce cognitive demands (reading, texting, TV watching, computer use, video games, etc). School attendance, after-school activities and work may need to be modified to avoid increasing symptoms. We recommend against driving until until all symptoms have resolved. You should take 650mg  of Acetaminophen (Tylenol) every 4 hours as needed for pain control; however, taking anti-inflammatory medication (Motrin/Advil/Ibuprofen) is not advised. Come back to the ER right away if you are having repeated episodes of vomiting, severe/worsening headache/dizziness or any other symptom that alarms you. We recommended that someone stay with you for the next 24 hours to monitor for these worrisome symptoms.
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
## 2021-04-18 ENCOUNTER — Telehealth: Payer: Self-pay | Admitting: Pharmacist

## 2021-04-18 NOTE — Chronic Care Management (AMB) (Signed)
    Chronic Care Management Pharmacy Assistant   Name: [name]  MRN: [id number] DOB: 1933/01/16  04/18/21 APPOINTMENT REMINDER   Called Patient No answer, left message of appointment on 04/19/21 at 1 via telephone visit with [name], Pharm D.   Notified to have all medications, supplements, blood pressure and/or blood sugar logs available during appointment and to return call if need to reschedule.  Care Gaps: AWV - Scheduled 5/23, Zoster Vaccine - Overdue Flu Vaccine - Overdue Covid Vaccines - Overdue  Star Rating Drug: Atorvastatin 40mg  - Last filled 04/07/2021 90DS  Any gaps in medications fill history? None    Medications: Outpatient Encounter Medications as of 04/18/2021  Medication Sig   acetaminophen (TYLENOL) 325 MG tablet Take 2 tablets (650 mg total) by mouth every 6 (six) hours as needed.   albuterol (VENTOLIN HFA) 108 (90 Base) MCG/ACT inhaler Inhale 2 puffs into the lungs every 6 (six) hours as needed for wheezing or shortness of breath.   amLODipine (NORVASC) 5 MG tablet TAKE 1 TABLET EVERY DAY   apixaban (ELIQUIS) 2.5 MG TABS tablet TAKE 1 TABLET(2.5 MG) BY MOUTH TWICE DAILY   Ascorbic Acid (VITAMIN C) 1000 MG tablet Take 1,000 mg by mouth daily.   aspirin EC 81 MG EC tablet Take 1 tablet (81 mg total) by mouth daily.   atorvastatin (LIPITOR) 40 MG tablet TAKE 1 TABLET EVERY DAY  AT  6PM   cholecalciferol (VITAMIN D3) 25 MCG (1000 UNIT) tablet Take 1,000 Units by mouth daily.   methocarbamol (ROBAXIN) 500 MG tablet Take 1 tablet (500 mg total) by mouth 2 (two) times daily for 5 days.   metoprolol tartrate (LOPRESSOR) 50 MG tablet TAKE 1 TABLET TWICE DAILY   Multiple Vitamin (MULTIVITAMIN WITH MINERALS) TABS tablet Take 1 tablet by mouth daily.   predniSONE (DELTASONE) 20 MG tablet Take two tablets once daily for 5 days   XTANDI 40 MG capsule Take 80 mg by mouth daily.    No facility-administered encounter medications on file as of 04/18/2021.      [place] Clinical Pharmacist Assistant [phone]

## 2021-04-19 ENCOUNTER — Telehealth: Payer: Self-pay | Admitting: Pharmacist

## 2021-04-19 ENCOUNTER — Telehealth: Payer: Medicare HMO
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
## 2021-04-19 NOTE — Telephone Encounter (Signed)
  Chronic Care Management   Outreach Note  04/19/2021 Name: [name] MRN: [id number] DOB: May 09, 1933  Referred by: [name], MD  Patient had a phone appointment scheduled with clinical pharmacist today.  An unsuccessful telephone outreach was attempted today. The patient was referred to the pharmacist for assistance with care management and care coordination.   If possible, a message was left to return call to: [phone] or to [facility]: Markle, PharmD, [place] [facility] [place]

## 2021-04-24 ENCOUNTER — Telehealth: Payer: Self-pay | Admitting: Pharmacist

## 2021-04-24 NOTE — Chronic Care Management (AMB) (Signed)
    Chronic Care Management Pharmacy Assistant   Name: [name]  MRN: [id number] DOB: [birth year]  04/24/21 APPOINTMENT REMINDER   Called Patient No answer, left message of appointment on 04/25/21 at 2 via telephone visit with [name], Pharm D.   Notified to have all medications, supplements, blood pressure and/or blood sugar logs available during appointment and to return call if need to reschedule.    Care Gaps: AWV - Scheduled 5/23, Zoster Vaccine - Overdue Flu Vaccine - Overdue Covid Vaccines - Overdue BP 140/80 (03/10/21)  Star Rating Drug: Atorvastatin 40mg  - Last filled 04/07/2021 90DS    Medications: Outpatient Encounter Medications as of 04/24/2021  Medication Sig   acetaminophen (TYLENOL) 325 MG tablet Take 2 tablets (650 mg total) by mouth every 6 (six) hours as needed.   albuterol (VENTOLIN HFA) 108 (90 Base) MCG/ACT inhaler Inhale 2 puffs into the lungs every 6 (six) hours as needed for wheezing or shortness of breath.   amLODipine (NORVASC) 5 MG tablet TAKE 1 TABLET EVERY DAY   apixaban (ELIQUIS) 2.5 MG TABS tablet TAKE 1 TABLET(2.5 MG) BY MOUTH TWICE DAILY   Ascorbic Acid (VITAMIN C) 1000 MG tablet Take 1,000 mg by mouth daily.   aspirin EC 81 MG EC tablet Take 1 tablet (81 mg total) by mouth daily.   atorvastatin (LIPITOR) 40 MG tablet TAKE 1 TABLET EVERY DAY  AT  6PM   cholecalciferol (VITAMIN D3) 25 MCG (1000 UNIT) tablet Take 1,000 Units by mouth daily.   metoprolol tartrate (LOPRESSOR) 50 MG tablet TAKE 1 TABLET TWICE DAILY   Multiple Vitamin (MULTIVITAMIN WITH MINERALS) TABS tablet Take 1 tablet by mouth daily.   predniSONE (DELTASONE) 20 MG tablet Take two tablets once daily for 5 days   XTANDI 40 MG capsule Take 80 mg by mouth daily.    No facility-administered encounter medications on file as of 04/24/2021.      [place] Clinical Pharmacist Assistant [phone]

## 2021-04-25 ENCOUNTER — Telehealth: Payer: Medicare HMO

## 2021-05-20 IMAGING — DX DG KNEE COMPLETE 4+V*L*
4 series · 4 of 4 positions shown · non-contrast
Comparison: None.

CLINICAL DATA: Chronic knee pain

EXAM:
LEFT KNEE - COMPLETE 4+ VIEW

[knee ap (1 of 3)]
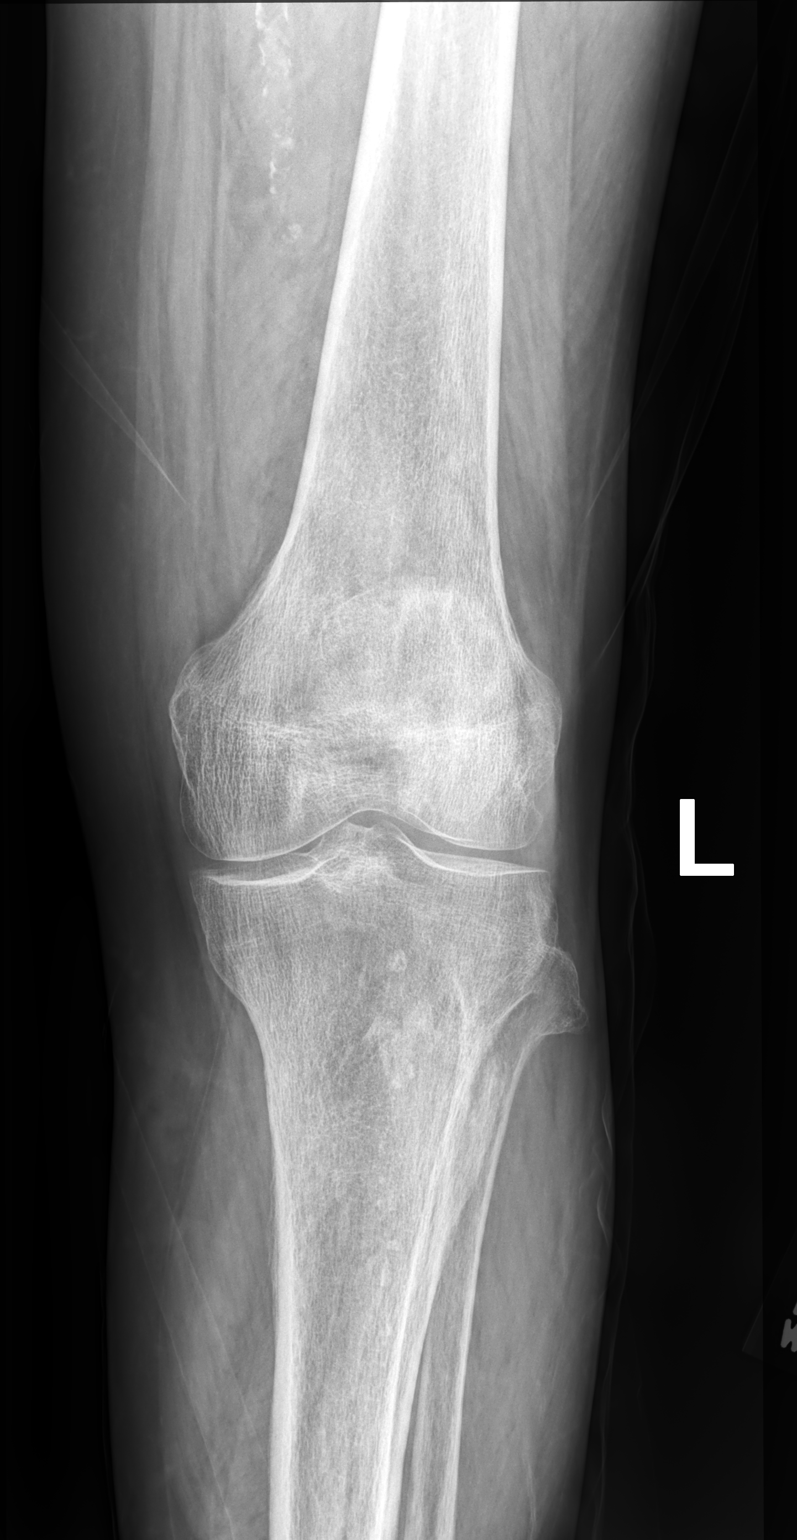

[knee ap (2 of 3)]
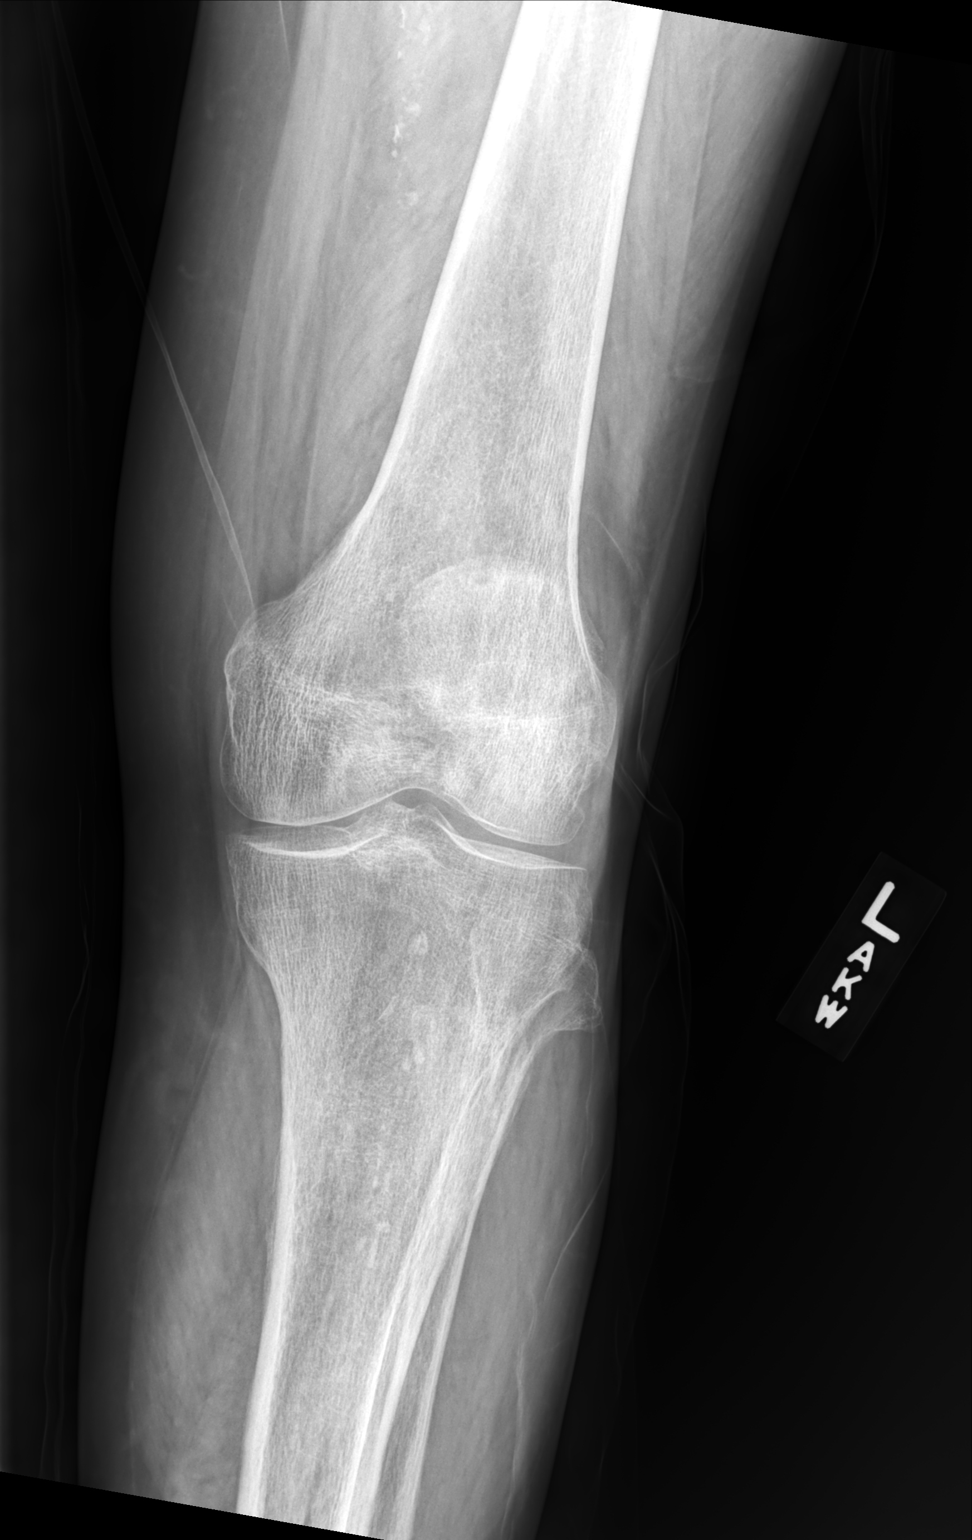

[knee ap (3 of 3)]
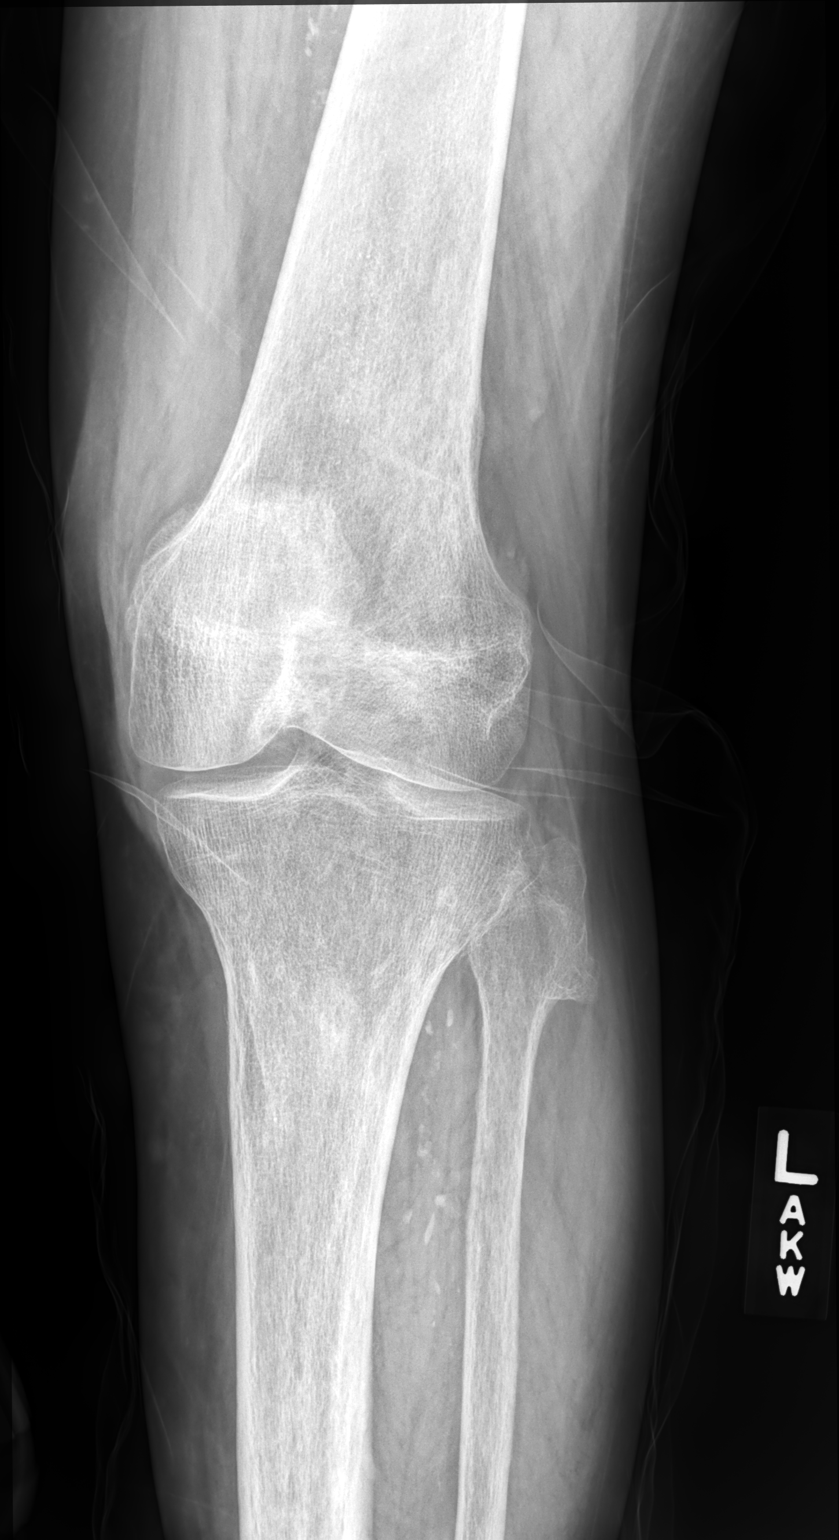

[knee lat]
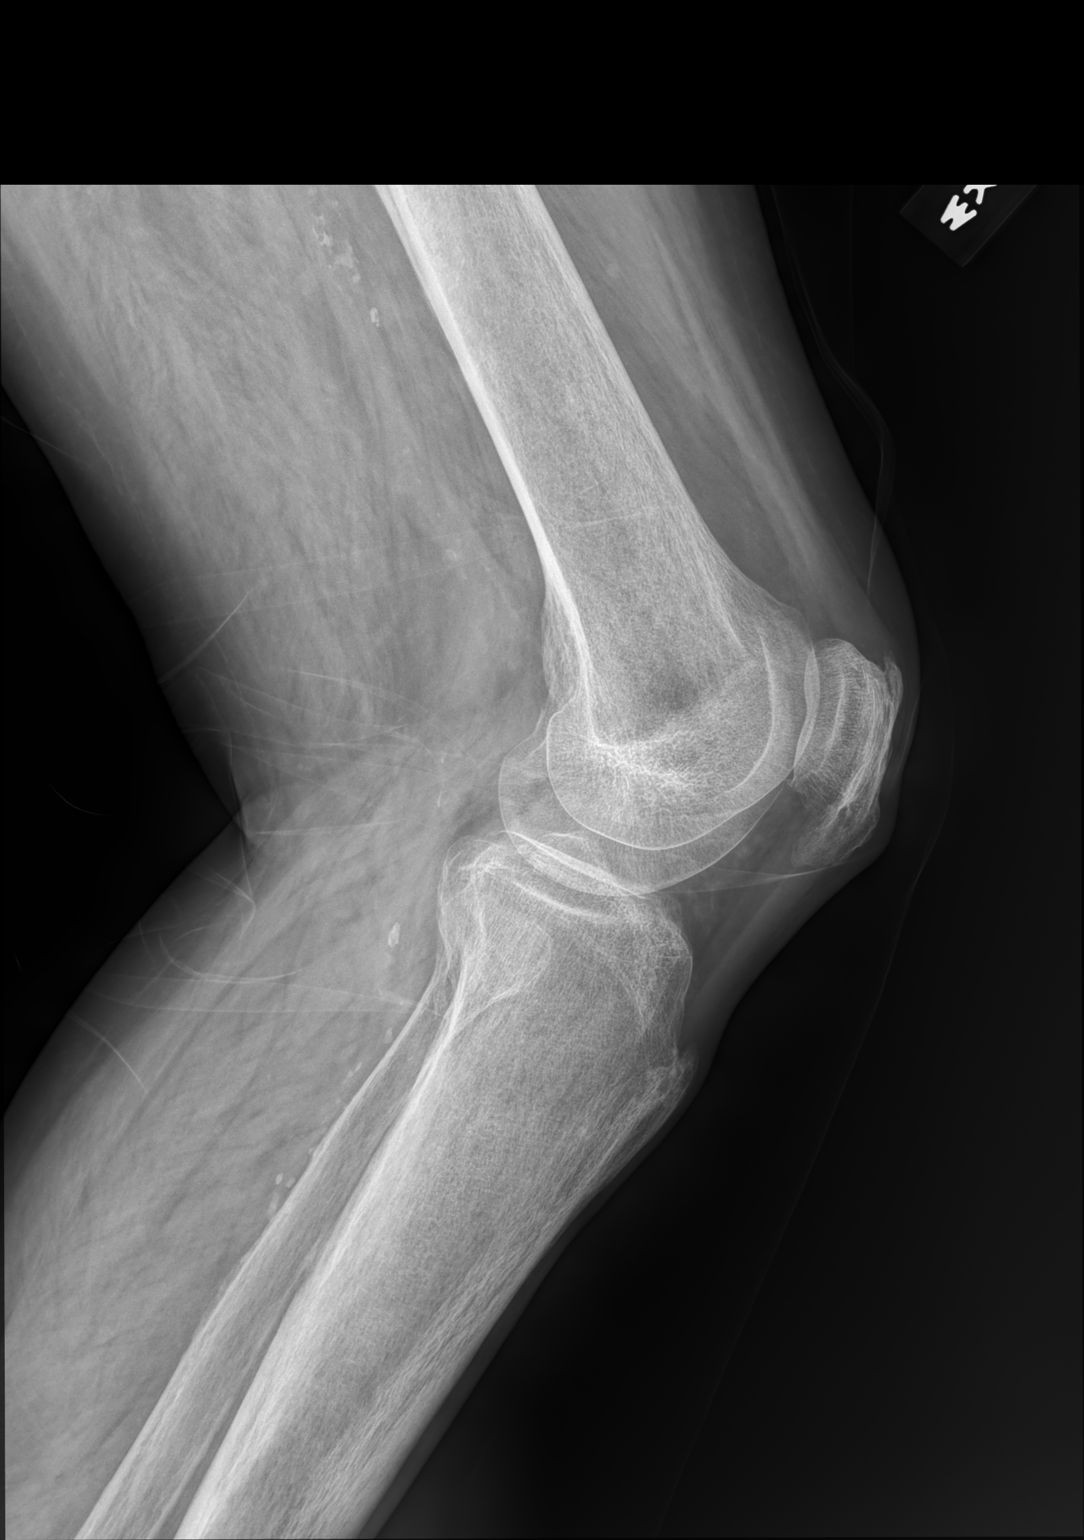

[4 of 4 positions shown; findings below may reference images not displayed]

FINDINGS: Tricompartment degenerative changes with joint space narrowing an
early spurring, most notable in the patellofemoral compartment. No
joint effusion. No acute bony abnormality. Specifically, no
fracture, subluxation, or dislocation. Vascular calcifications
noted.
IMPRESSION: Mild tricompartment degenerative changes. No acute bony abnormality.

## 2021-06-22 ENCOUNTER — Other Ambulatory Visit: Payer: Self-pay | Admitting: Cardiovascular Disease

## 2021-07-19 ENCOUNTER — Telehealth: Payer: Self-pay | Admitting: Pharmacist

## 2021-07-19 NOTE — Chronic Care Management (AMB) (Unsigned)
° ° °  Chronic Care Management Pharmacy Assistant   Name: [name]  MRN: [id number] DOB: Jul 18, 1932  Reason for Encounter: Disease State Hypertension Assessment   Conditions to be addressed/monitored: HTN  Recent office visits:  03/10/21 Joseph Post, MD - Patient presented for cough unspecified. Prescribed Cefuroxime and Prednisone  Recent consult visits:  02/17/21 [name] (Urology) - Patient presented for Malignant neoplasm of prostate and other concerns. No other visit details available.  Hospital visits:  Medication Reconciliation was completed by comparing discharge summary, patients EMR and Pharmacy list, and upon discussion with patient.  Patient presented to [place] ED on 04/13/21 due to Hematoma and other concerns. Patient was present for 1 hour  New?Medications Started at [facility] Discharge:?? -started  acetaminophen 325 MG tablet methocarbamol 500 MG tablet  Medication Changes at Hospital Discharge: -Changed  none  Medications Discontinued at Hospital Discharge: -Stopped  none  Medications that remain the same after Hospital Discharge:??  -All other medications will remain the same.    Medications: Outpatient Encounter Medications as of 07/19/2021  Medication Sig   acetaminophen (TYLENOL) 325 MG tablet Take 2 tablets (650 mg total) by mouth every 6 (six) hours as needed.   albuterol (VENTOLIN HFA) 108 (90 Base) MCG/ACT inhaler Inhale 2 puffs into the lungs every 6 (six) hours as needed for wheezing or shortness of breath.   amLODipine (NORVASC) 5 MG tablet TAKE 1 TABLET EVERY DAY   apixaban (ELIQUIS) 2.5 MG TABS tablet TAKE 1 TABLET(2.5 MG) BY MOUTH TWICE DAILY   Ascorbic Acid (VITAMIN C) 1000 MG tablet Take 1,000 mg by mouth daily.   aspirin EC 81 MG EC tablet Take 1 tablet (81 mg total) by mouth daily.   atorvastatin (LIPITOR) 40 MG tablet TAKE 1 TABLET EVERY DAY  AT  6PM   cholecalciferol (VITAMIN D3) 25 MCG (1000 UNIT)  tablet Take 1,000 Units by mouth daily.   metoprolol tartrate (LOPRESSOR) 50 MG tablet TAKE 1 TABLET TWICE DAILY   Multiple Vitamin (MULTIVITAMIN WITH MINERALS) TABS tablet Take 1 tablet by mouth daily.   predniSONE (DELTASONE) 20 MG tablet Take two tablets once daily for 5 days   XTANDI 40 MG capsule Take 80 mg by mouth daily.    No facility-administered encounter medications on file as of 07/19/2021.    Care Gaps: Zoster Vaccine - Overdue COVID Booster - Overdue Flu Vaccine - Overdue BP- AWV- CCM-  Star Rating Drugs: Atorvastatin 40 mg - Last filled 06/23/21 90 DS at [place] Pharmacist Assistant [phone]

## 2021-08-14 DIAGNOSIS — C61 Malignant neoplasm of prostate: Secondary | ICD-10-CM | POA: Diagnosis not present

## 2021-08-28 DIAGNOSIS — C775 Secondary and unspecified malignant neoplasm of intrapelvic lymph nodes: Secondary | ICD-10-CM | POA: Diagnosis not present

## 2021-08-28 DIAGNOSIS — N183 Chronic kidney disease, stage 3 unspecified: Secondary | ICD-10-CM | POA: Diagnosis not present

## 2021-08-28 DIAGNOSIS — I509 Heart failure, unspecified: Secondary | ICD-10-CM | POA: Diagnosis not present

## 2021-08-28 DIAGNOSIS — C61 Malignant neoplasm of prostate: Secondary | ICD-10-CM | POA: Diagnosis not present

## 2021-09-01 ENCOUNTER — Telehealth: Payer: Self-pay | Admitting: Pharmacist

## 2021-09-01 NOTE — Chronic Care Management (AMB) (Signed)
? ? ?  Chronic Care Management ?Pharmacy Assistant  ? ?Name: [name]  MRN: [id number] DOB: 03-08-1933 ? ?Reason for Encounter: Offer Follow up with [name] Clinical Pharmacist  ?  ?Recent office visits:  ?None ? ?Recent consult visits:  ?None ? ?Hospital visits:  ?Medication Reconciliation was completed by comparing discharge summary, patient?s EMR and Pharmacy list, and upon discussion with patient. ?  ?Patient presented to [place] ED on 04/13/21 due to Hematoma and other concerns. Patient was present for 1 hour ?  ?New?Medications Started at [facility] Discharge:?? ?-started  ?acetaminophen 325 MG tablet ?methocarbamol 500 MG tablet ?  ?Medication Changes at Hospital Discharge: ?-Changed  ?none ?  ?Medications Discontinued at Hospital Discharge: ?-Stopped  ?none ?  ?Medications that remain the same after Hospital Discharge:??  ?-All other medications will remain the same.  ? ?Medications: ?Outpatient Encounter Medications as of 09/01/2021  ?Medication Sig  ? acetaminophen (TYLENOL) 325 MG tablet Take 2 tablets (650 mg total) by mouth every 6 (six) hours as needed.  ? albuterol (VENTOLIN HFA) 108 (90 Base) MCG/ACT inhaler Inhale 2 puffs into the lungs every 6 (six) hours as needed for wheezing or shortness of breath.  ? amLODipine (NORVASC) 5 MG tablet TAKE 1 TABLET EVERY DAY  ? apixaban (ELIQUIS) 2.5 MG TABS tablet TAKE 1 TABLET(2.5 MG) BY MOUTH TWICE DAILY  ? Ascorbic Acid (VITAMIN C) 1000 MG tablet Take 1,000 mg by mouth daily.  ? aspirin EC 81 MG EC tablet Take 1 tablet (81 mg total) by mouth daily.  ? atorvastatin (LIPITOR) 40 MG tablet TAKE 1 TABLET EVERY DAY  AT  6PM  ? cholecalciferol (VITAMIN D3) 25 MCG (1000 UNIT) tablet Take 1,000 Units by mouth daily.  ? metoprolol tartrate (LOPRESSOR) 50 MG tablet TAKE 1 TABLET TWICE DAILY  ? Multiple Vitamin (MULTIVITAMIN WITH MINERALS) TABS tablet Take 1 tablet by mouth daily.  ? predniSONE (DELTASONE) 20 MG tablet Take two tablets once  daily for 5 days  ? XTANDI 40 MG capsule Take 80 mg by mouth daily.   ? ?No facility-administered encounter medications on file as of 09/01/2021.  ?Notes: ?Call to patient to offer follow up appointment with [name] did not reach left voicemail with my contact information for return call. ? ?Care Gaps: ?Zoster Vaccine - Overdue ?COVID Booster - Overdue ?Flu Vaccine - Overdue ?BP-  177/106 (04/13/21 Hosp) ?AWV- 4/22 ? ?Star Rating Drugs: ?Atorvastatin 40 mg - Last filled 06/23/21 90 DS at [facility] ? ? ? ? ?Ned Clines CMA ?Clinical Pharmacist Assistant ?[phone] ? ?

## 2021-09-12 ENCOUNTER — Ambulatory Visit (INDEPENDENT_AMBULATORY_CARE_PROVIDER_SITE_OTHER): Payer: Medicare HMO

## 2021-09-12 VITALS — BP 130/80 | HR 84 | Temp 97.7°F | Ht 69.0 in | Wt 207.2 lb

## 2021-09-12 DIAGNOSIS — Z0000 Encounter for general adult medical examination without abnormal findings: Secondary | ICD-10-CM

## 2021-09-12 NOTE — Progress Notes (Signed)
?This visit occurred during the SARS-CoV-2 public health emergency.  Safety protocols were in place, including screening questions prior to the visit, additional usage of staff PPE, and extensive cleaning of exam room while observing appropriate contact time as indicated for disinfecting solutions. ? ?Subjective:  ? [name] is a 88 y.o. male who presents for Medicare Annual/Subsequent preventive examination. ? ?Review of Systems    ? ?Cardiac Risk Factors include: advanced age (>42mn, >>71women);dyslipidemia;hypertension;male gender;obesity (BMI >30kg/m2) ? ?   ?Objective:  ?  ?Today's Vitals  ? 09/12/21 1103 09/12/21 1130  ?BP: 140/90 130/80  ?Pulse: 84   ?Temp: 97.7 ?F (36.5 ?C)   ?TempSrc: Oral   ?SpO2: 97%   ?Weight: 207 lb 3.2 oz (94 kg)   ?Height: '5\' 9"'$  (1.753 m)   ? ?Body mass index is 30.6 kg/m?. ? ? ?  09/12/2021  ? 11:15 AM 04/13/2021  ?  7:02 PM 09/06/2020  ? 11:08 AM 03/19/2019  ?  5:40 PM 03/18/2019  ?  1:19 PM 08/06/2013  ? 12:34 PM 04/14/2013  ? 10:36 AM  ?Advanced Directives  ?Does Patient Have a Medical Advance Directive? Yes No Yes  No Patient does not have advance directive;Patient would not like information Patient does not have advance directive  ?Type of [place]of [place] will  Living will      ?Copy of HMalagain Chart? No - copy requested        ?Would patient like information on creating a medical advance directive?  No - Patient declined  No - Patient declined No - Patient declined    ?Pre-existing out of facility DNR order (yellow form or pink MOST form)       No  ? ? ?Current Medications (verified) ?Outpatient Encounter Medications as of 09/12/2021  ?Medication Sig  ? acetaminophen (TYLENOL) 325 MG tablet Take 2 tablets (650 mg total) by mouth every 6 (six) hours as needed.  ? albuterol (VENTOLIN HFA) 108 (90 Base) MCG/ACT inhaler Inhale 2 puffs into the lungs every 6 (six) hours as needed for wheezing or shortness of breath.  ?  amLODipine (NORVASC) 5 MG tablet TAKE 1 TABLET EVERY DAY  ? apixaban (ELIQUIS) 2.5 MG TABS tablet TAKE 1 TABLET(2.5 MG) BY MOUTH TWICE DAILY  ? Ascorbic Acid (VITAMIN C) 1000 MG tablet Take 1,000 mg by mouth daily.  ? aspirin EC 81 MG EC tablet Take 1 tablet (81 mg total) by mouth daily.  ? atorvastatin (LIPITOR) 40 MG tablet TAKE 1 TABLET EVERY DAY  AT  6PM  ? cholecalciferol (VITAMIN D3) 25 MCG (1000 UNIT) tablet Take 1,000 Units by mouth daily.  ? metoprolol tartrate (LOPRESSOR) 50 MG tablet TAKE 1 TABLET TWICE DAILY  ? Multiple Vitamin (MULTIVITAMIN WITH MINERALS) TABS tablet Take 1 tablet by mouth daily.  ? XTANDI 40 MG capsule Take 80 mg by mouth daily.   ? predniSONE (DELTASONE) 20 MG tablet Take two tablets once daily for 5 days (Patient not taking: Reported on 09/12/2021)  ? ?No facility-administered encounter medications on file as of 09/12/2021.  ? ? ?Allergies (verified) ?Patient has no known allergies.  ? ?History: ?Past Medical History:  ?Diagnosis Date  ? Disturbances of sulphur-bearing amino-acid metabolism   ? Malignant neoplasm of prostate ([place]   ? Obstructive sleep apnea (adult) (pediatric) 2004  ? surgery to coorrect  ? Osteoarthrosis, unspecified whether generalized or localized, unspecified site   ? Other and unspecified hyperlipidemia   ?  Other specified cardiac dysrhythmias(427.89)   ? Pacemaker 04/14/2013  ? DUAL CHAMBER PACEMAKER  ? Paroxysmal atrial fibrillation (HCC)   ? Unspecified cerebral artery occlusion with cerebral infarction   ? Unspecified disorder resulting from impaired renal function   ? Unspecified essential hypertension   ? Wears glasses   ? Wears partial dentures   ? upper partial  ? ?Past Surgical History:  ?Procedure Laterality Date  ? APPLICATION OF A-CELL OF EXTREMITY Right 08/12/2013  ? Procedure: APPLICATION OF A-CELL OF EXTREMITY;  Surgeon: Theodoro Kos, DO;  Location: [facility];  Service: Plastics;  Laterality: Right;  ? CHOLECYSTECTOMY  1987  ?  COLONOSCOPY    ? ent surgery  2004  ? correct snoring  ? INGUINAL HERNIA REPAIR    ? left  ? INSERT / REPLACE / REMOVE PACEMAKER  04/14/2013  ? dual chamber   / Dr Sallyanne Kuster  ? MINOR IRRIGATION AND DEBRIDEMENT OF WOUND Right 03/04/2013  ? Procedure: MINOR IRRIGATION AND DEBRIDEMENT OF WOUND;  Surgeon: Theodoro Kos, DO;  Location: [facility];  Service: Plastics;  Laterality: Right;  ? ORCHIECTOMY  2002  ? PERMANENT PACEMAKER INSERTION N/A 04/14/2013  ? Procedure: PERMANENT PACEMAKER INSERTION;  Surgeon: [name], MD;  Location: [facility];  Service: Cardiovascular;  Laterality: N/A;  ? SKIN SPLIT GRAFT Right 08/12/2013  ? Procedure: SKIN GRAFT SPLIT THICKNESS WITH PLACEMENT OF VAC TO RIGHT LOWER LEG/PLACEMENT OF ACELL TO RIGHT UPPER THIGH AREA (HARVEST SITE);  Surgeon: Theodoro Kos, DO;  Location: [facility];  Service: Plastics;  Laterality: Right;  ? ?Family History  ?Problem Relation Age of Onset  ? Prostate cancer Father   ? Cancer Father   ?     prostate cancer  ? Cancer Sister   ?     ovarian cancer  ? ?Social History  ? ?Socioeconomic History  ? Marital status: Married  ?  Spouse name: joan Westby  ? Number of children: Not on file  ? Years of education: Not on file  ? Highest education level: Not on file  ?Occupational History  ? Occupation: Retired  ? Occupation: RETIRED  ?  Employer: RETIRED  ?Tobacco Use  ? Smoking status: Some Days  ?  Packs/day: 0.50  ?  Years: 30.00  ?  Pack years: 15.00  ?  Types: Cigarettes  ? Smokeless tobacco: Never  ? Tobacco comments:  ?   pt stated he smokes 1 pack every 2 wks  ?Vaping Use  ? Vaping Use: Never used  ?Substance and Sexual Activity  ? Alcohol use: No  ? Drug use: No  ? Sexual activity: Not on file  ?  Comment: started smoking 2-3 cig daily  ?Other Topics Concern  ? Not on file  ?Social History Narrative  ? Not on file  ? ?Social Determinants of Health  ? ?Financial Resource Strain: Low Risk   ? Difficulty of Paying Living  Expenses: Not hard at all  ?Food Insecurity: No Food Insecurity  ? Worried About Charity fundraiser in the Last Year: Never true  ? Ran Out of Food in the Last Year: Never true  ?Transportation Needs: No Transportation Needs  ? Lack of Transportation (Medical): No  ? Lack of Transportation (Non-Medical): No  ?Physical Activity: Inactive  ? Days of Exercise per Week: 0 days  ? Minutes of Exercise per Session: 0 min  ?Stress: No Stress Concern Present  ? Feeling of Stress : Not at all  ?  Social Connections: Not on file  ? ? ?Tobacco Counseling ?Ready to quit: Yes ?Counseling given: Not Answered ?Tobacco comments:  pt stated he smokes 1 pack every 2 wks ? ? ?Clinical Intake: ? ?Pre-visit preparation completed: Yes ? ?Pain : No/denies pain ? ?  ? ?Nutritional Status: BMI > 30  Obese ?Nutritional Risks: None ?Diabetes: No ? ?How often do you need to have someone help you when you read instructions, pamphlets, or other written materials from your doctor or pharmacy?: 1 - Never ?What is the last grade level you completed in school?: 12th grade ? ?Diabetic? no ? ?Interpreter Needed?: No ? ?Information entered by :: NAllen LPN ? ? ?Activities of Daily Living ? ?  09/12/2021  ? 11:17 AM  ?In your present state of health, do you have any difficulty performing the following activities:  ?Hearing? 1  ?Vision? 0  ?Difficulty concentrating or making decisions? 1  ?Walking or climbing stairs? 0  ?Dressing or bathing? 0  ?Doing errands, shopping? 0  ?Preparing Food and eating ? N  ?Using the Toilet? N  ?In the past six months, have you accidently leaked urine? N  ?Do you have problems with loss of bowel control? N  ?Managing your Medications? N  ?Managing your Finances? N  ?Housekeeping or managing your Housekeeping? N  ? ? ?Patient Care Team: ?[name], MD as PCP - General (Family Medicine) ?Croitoru, Dani Gobble, MD as PCP - Cardiology (Cardiology) ?Viona Gilmore, [facility] as Pharmacist (Pharmacist) ? ?Indicate any recent Medical  Services you may have received from other than [facility] providers in the past year (date may be approximate). ? ?   ?Assessment:  ? This is a routine wellness examination for [name]. ? ?Hearing/Vision screen ?Vision S

## 2021-09-12 NOTE — Patient Instructions (Signed)
[name] , ?Thank you for taking time to come for your Medicare Wellness Visit. I appreciate your ongoing commitment to your health goals. Please review the following plan we discussed and let me know if I can assist you in the future.  ? ?Screening recommendations/referrals: ?Colonoscopy: not required ?Recommended yearly ophthalmology/optometry visit for glaucoma screening and checkup ?Recommended yearly dental visit for hygiene and checkup ? ?Vaccinations: ?Influenza vaccine: due 12/12/2021 ?Pneumococcal vaccine: completed 05/18/2016 ?Tdap vaccine: completed 04/13/2021, due 04/14/2031 ?Shingles vaccine: discussed   ?Covid-19:  10/02/2020, 03/13/2020, 07/22/2019, 06/27/2019 ? ?Advanced directives: Please bring a copy of your POA (Power of Attorney) and/or Living Will to your next appointment.  ? ?Conditions/risks identified: none ? ?Next appointment: Follow up in one year for your annual wellness visit.  ? ?Preventive Care 42 Years and Older, Male ?Preventive care refers to lifestyle choices and visits with your health care provider that can promote health and wellness. ?What does preventive care include? ?A yearly physical exam. This is also called an annual well check. ?Dental exams once or twice a year. ?Routine eye exams. Ask your health care provider how often you should have your eyes checked. ?Personal lifestyle choices, including: ?Daily care of your teeth and gums. ?Regular physical activity. ?Eating a healthy diet. ?Avoiding tobacco and drug use. ?Limiting alcohol use. ?Practicing safe sex. ?Taking low doses of aspirin every day. ?Taking vitamin and mineral supplements as recommended by your health care provider. ?What happens during an annual well check? ?The services and screenings done by your health care provider during your annual well check will depend on your age, overall health, lifestyle risk factors, and family history of disease. ?Counseling  ?Your health care provider may ask you questions about  your: ?Alcohol use. ?Tobacco use. ?Drug use. ?Emotional well-being. ?Home and relationship well-being. ?Sexual activity. ?Eating habits. ?History of falls. ?Memory and ability to understand (cognition). ?Work and work Statistician. ?Screening  ?You may have the following tests or measurements: ?Height, weight, and BMI. ?Blood pressure. ?Lipid and cholesterol levels. These may be checked every 5 years, or more frequently if you are over 64 years old. ?Skin check. ?Lung cancer screening. You may have this screening every year starting at age 65 if you have a 30-pack-year history of smoking and currently smoke or have quit within the past 15 years. ?Fecal occult blood test (FOBT) of the stool. You may have this test every year starting at age 76. ?Flexible sigmoidoscopy or colonoscopy. You may have a sigmoidoscopy every 5 years or a colonoscopy every 10 years starting at age 66. ?Prostate cancer screening. Recommendations will vary depending on your family history and other risks. ?Hepatitis C blood test. ?Hepatitis B blood test. ?Sexually transmitted disease (STD) testing. ?Diabetes screening. This is done by checking your blood sugar (glucose) after you have not eaten for a while (fasting). You may have this done every 1-3 years. ?Abdominal aortic aneurysm (AAA) screening. You may need this if you are a current or former smoker. ?Osteoporosis. You may be screened starting at age 61 if you are at high risk. ?Talk with your health care provider about your test results, treatment options, and if necessary, the need for more tests. ?Vaccines  ?Your health care provider may recommend certain vaccines, such as: ?Influenza vaccine. This is recommended every year. ?Tetanus, diphtheria, and acellular pertussis (Tdap, Td) vaccine. You may need a Td booster every 10 years. ?Zoster vaccine. You may need this after age 50. ?Pneumococcal 13-valent conjugate (PCV13) vaccine. One dose is  recommended after age 56. ?Pneumococcal  polysaccharide (PPSV23) vaccine. One dose is recommended after age 70. ?Talk to your health care provider about which screenings and vaccines you need and how often you need them. ?This information is not intended to replace advice given to you by your health care provider. Make sure you discuss any questions you have with your health care provider. ?Document Released: 05/27/2015 Document Revised: 01/18/2016 Document Reviewed: 03/01/2015 ?Elsevier Interactive Patient Education ? 2017 [employer]. ? ?Fall Prevention in the Home ?Falls can cause injuries. They can happen to people of all ages. There are many things you can do to make your home safe and to help prevent falls. ?What can I do on the outside of my home? ?Regularly fix the edges of walkways and driveways and fix any cracks. ?Remove anything that might make you trip as you walk through a door, such as a raised step or threshold. ?Trim any bushes or trees on the path to your home. ?Use bright outdoor lighting. ?Clear any walking paths of anything that might make someone trip, such as rocks or tools. ?Regularly check to see if handrails are loose or broken. Make sure that both sides of any steps have handrails. ?Any raised decks and porches should have guardrails on the edges. ?Have any leaves, snow, or ice cleared regularly. ?Use sand or salt on walking paths during winter. ?Clean up any spills in your garage right away. This includes oil or grease spills. ?What can I do in the bathroom? ?Use night lights. ?Install grab bars by the toilet and in the tub and shower. Do not use towel bars as grab bars. ?Use non-skid mats or decals in the tub or shower. ?If you need to sit down in the shower, use a plastic, non-slip stool. ?Keep the floor dry. Clean up any water that spills on the floor as soon as it happens. ?Remove soap buildup in the tub or shower regularly. ?Attach bath mats securely with double-sided non-slip rug tape. ?Do not have throw rugs and other  things on the floor that can make you trip. ?What can I do in the bedroom? ?Use night lights. ?Make sure that you have a light by your bed that is easy to reach. ?Do not use any sheets or blankets that are too big for your bed. They should not hang down onto the floor. ?Have a firm chair that has side arms. You can use this for support while you get dressed. ?Do not have throw rugs and other things on the floor that can make you trip. ?What can I do in the kitchen? ?Clean up any spills right away. ?Avoid walking on wet floors. ?Keep items that you use a lot in easy-to-reach places. ?If you need to reach something above you, use a strong step stool that has a grab bar. ?Keep electrical cords out of the way. ?Do not use floor polish or wax that makes floors slippery. If you must use wax, use non-skid floor wax. ?Do not have throw rugs and other things on the floor that can make you trip. ?What can I do with my stairs? ?Do not leave any items on the stairs. ?Make sure that there are handrails on both sides of the stairs and use them. Fix handrails that are broken or loose. Make sure that handrails are as long as the stairways. ?Check any carpeting to make sure that it is firmly attached to the stairs. Fix any carpet that is loose or worn. ?Avoid having  throw rugs at the top or bottom of the stairs. If you do have throw rugs, attach them to the floor with carpet tape. ?Make sure that you have a light switch at the top of the stairs and the bottom of the stairs. If you do not have them, ask someone to add them for you. ?What else can I do to help prevent falls? ?Wear shoes that: ?Do not have high heels. ?Have rubber bottoms. ?Are comfortable and fit you well. ?Are closed at the toe. Do not wear sandals. ?If you use a stepladder: ?Make sure that it is fully opened. Do not climb a closed stepladder. ?Make sure that both sides of the stepladder are locked into place. ?Ask someone to hold it for you, if possible. ?Clearly  mark and make sure that you can see: ?Any grab bars or handrails. ?First and last steps. ?Where the edge of each step is. ?Use tools that help you move around (mobility aids) if they are needed. These include: ?C

## 2021-11-23 ENCOUNTER — Telehealth: Payer: Self-pay | Admitting: Pharmacist

## 2021-11-23 NOTE — Chronic Care Management (AMB) (Signed)
Chronic Care Management Pharmacy Assistant   Name: [name]  MRN: [id number] DOB: May 14, 1933  Reason for Encounter: General Assessment per MP   Recent office visits:  09/12/21 Kellie Simmering, LPN - Patient presented for Medicare Annual wellness exam. No medication changes.  Recent consult visits:  None   Hospital visits:  Medication Reconciliation was completed by comparing discharge summary, patient's EMR and Pharmacy list, and upon discussion with patient.   Patient presented to [place] ED on 04/13/21 due to Hematoma and other concerns. Patient was present for 1 hour   New?Medications Started at [facility] Discharge:?? -started  acetaminophen 325 MG tablet methocarbamol 500 MG tablet   Medication Changes at Hospital Discharge: -Changed  none   Medications Discontinued at Hospital Discharge: -Stopped  none   Medications that remain the same after Hospital Discharge:??  -All other medications will remain the same.     Medications: Outpatient Encounter Medications as of 11/23/2021  Medication Sig   acetaminophen (TYLENOL) 325 MG tablet Take 2 tablets (650 mg total) by mouth every 6 (six) hours as needed.   albuterol (VENTOLIN HFA) 108 (90 Base) MCG/ACT inhaler Inhale 2 puffs into the lungs every 6 (six) hours as needed for wheezing or shortness of breath.   amLODipine (NORVASC) 5 MG tablet TAKE 1 TABLET EVERY DAY   apixaban (ELIQUIS) 2.5 MG TABS tablet TAKE 1 TABLET(2.5 MG) BY MOUTH TWICE DAILY   Ascorbic Acid (VITAMIN C) 1000 MG tablet Take 1,000 mg by mouth daily.   aspirin EC 81 MG EC tablet Take 1 tablet (81 mg total) by mouth daily.   atorvastatin (LIPITOR) 40 MG tablet TAKE 1 TABLET EVERY DAY  AT  6PM   cholecalciferol (VITAMIN D3) 25 MCG (1000 UNIT) tablet Take 1,000 Units by mouth daily.   metoprolol tartrate (LOPRESSOR) 50 MG tablet TAKE 1 TABLET TWICE DAILY   Multiple Vitamin (MULTIVITAMIN WITH MINERALS) TABS tablet Take 1 tablet by  mouth daily.   predniSONE (DELTASONE) 20 MG tablet Take two tablets once daily for 5 days (Patient not taking: Reported on 09/12/2021)   XTANDI 40 MG capsule Take 80 mg by mouth daily.    No facility-administered encounter medications on file as of 11/23/2021.   Contacted [name] for General Review Call   Adherence Review:  Does the Clinical Pharmacist Assistant have access to adherence rates? Yes Adherence rates for STAR metric medications  Atorvastatin 40 mg - Last filled 09/18/21 90 DS at [facility] Atorvastatin 40 mg - Last filled 06/22/21 90 DS at [facility]  Does the patient have >5 day gap between last estimated fill dates for any of the above medications or other medication gaps? No  Disease State Questions:  Able to connect with Patient? Spoke to patients wife Did patient have any problems with their health recently? No  Have you had any admissions or emergency room visits or worsening of your condition(s) since last visit? No  Have you had any visits with new specialists or providers since your last visit? No  Have you had any new health care problem(s) since your last visit? No  Have you run out of any of your medications since you last spoke with clinical pharmacist? No  Are there any medications you are not taking as prescribed? No  Are you having any issues or side effects with your medications? No  Do you have any other health concerns or questions you want to discuss with your Clinical Pharmacist before your next  visit? No  Are there any health concerns that you feel we can do a better job addressing? No  Are you having any problems with any of the following since the last visit: (select all that apply)  None  12. Any falls since last visit? No  13. Any increased or uncontrolled pain since last visit? No   14. Next visit Type: office       Visit with: AWV 09/2022        15. Additional Details? No   Decl Pharm follow up at this time   Care  Gaps: Zoster Vaccine - Overdue COVID Booster - Overdue BP- 130/80 09/12/21 AWV- 4/22 CCM- Decl AWV- 5/23  Star Rating Drugs: Atorvastatin 40 mg - Last filled 09/18/21 90 DS at [facility] Pharmacist Assistant [phone]

## 2022-01-30 ENCOUNTER — Other Ambulatory Visit: Payer: Self-pay

## 2022-01-30 MED ORDER — METOPROLOL TARTRATE 50 MG PO TABS: 50.0000 mg | ORAL_TABLET | Freq: Two times a day (BID) | ORAL | 0 refills | Status: DC

## 2022-01-30 MED ORDER — AMLODIPINE BESYLATE 5 MG PO TABS: 5.0000 mg | ORAL_TABLET | Freq: Every day | ORAL | 0 refills | Status: DC

## 2022-01-30 MED ORDER — METOPROLOL TARTRATE 50 MG PO TABS: 50.0000 mg | ORAL_TABLET | Freq: Two times a day (BID) | ORAL | 2 refills | Status: DC

## 2022-02-05 ENCOUNTER — Telehealth: Payer: Self-pay | Admitting: Cardiovascular Disease

## 2022-02-05 NOTE — Telephone Encounter (Signed)
If he is not compliant, then warfarin would be even worse than Eliquis. Can try getting him on the Xarelto patient assistance plan, which is better than Eliquis.

## 2022-02-05 NOTE — Telephone Encounter (Signed)
Spoke to patient 's wife. She states that  her husband has not been taking Eliqius for over a year.   She states patient is on a Cancer drug called  Xtandi.- which is very expensive.  Wife states patient does not follow up  very well with appointment or issue that come up with his health. She called in today to  set up appointment and found out about medication. Patient last cardiology appointment was 02/01/20.   Options are to see if patient and get Patient assistance  or need to changed another medication or warfarin.  RN moved  office appointment to 02/07/22 . Wife states she will have patient here  Wednesday 02/07/22.

## 2022-02-05 NOTE — Telephone Encounter (Signed)
Pt c/o medication issue:  1. Name of Medication:   apixaban (ELIQUIS) 2.5 MG TABS tablet    2. How are you currently taking this medication (dosage and times per day)? N/A  3. Are you having a reaction (difficulty breathing--STAT)? No  4. What is your medication issue? Spouse states that pt has not been taking medication due to the cost. They would like to know if pt is able to get some type of assistance to help pay for medication. Please advise

## 2022-02-06 ENCOUNTER — Encounter: Payer: Self-pay | Admitting: Family Medicine

## 2022-02-06 ENCOUNTER — Ambulatory Visit (INDEPENDENT_AMBULATORY_CARE_PROVIDER_SITE_OTHER): Payer: Medicare HMO | Admitting: Family Medicine

## 2022-02-06 VITALS — BP 144/86 | HR 82 | Temp 97.5°F | Ht 68.9 in | Wt 208.3 lb

## 2022-02-06 DIAGNOSIS — I10 Essential (primary) hypertension: Secondary | ICD-10-CM | POA: Diagnosis not present

## 2022-02-06 DIAGNOSIS — Z23 Encounter for immunization: Secondary | ICD-10-CM

## 2022-02-06 DIAGNOSIS — E7800 Pure hypercholesterolemia, unspecified: Secondary | ICD-10-CM | POA: Diagnosis not present

## 2022-02-06 DIAGNOSIS — Z79899 Other long term (current) drug therapy: Secondary | ICD-10-CM | POA: Diagnosis not present

## 2022-02-06 DIAGNOSIS — N1832 Chronic kidney disease, stage 3b: Secondary | ICD-10-CM

## 2022-02-06 DIAGNOSIS — R739 Hyperglycemia, unspecified: Secondary | ICD-10-CM

## 2022-02-06 DIAGNOSIS — R531 Weakness: Secondary | ICD-10-CM

## 2022-02-06 LAB — CBC WITH DIFFERENTIAL/PLATELET
Basophils Absolute: 0 10*3/uL (ref 0.0–0.1)
Basophils Relative: 0.5 % (ref 0.0–3.0)
Eosinophils Absolute: 0.2 10*3/uL (ref 0.0–0.7)
Eosinophils Relative: 2.8 % (ref 0.0–5.0)
HCT: 39.8 % (ref 39.0–52.0)
Hemoglobin: 13 g/dL (ref 13.0–17.0)
Lymphocytes Relative: 26.3 % (ref 12.0–46.0)
Lymphs Abs: 2.3 10*3/uL (ref 0.7–4.0)
MCHC: 32.8 g/dL (ref 30.0–36.0)
MCV: 89.1 fl (ref 78.0–100.0)
Monocytes Absolute: 0.8 10*3/uL (ref 0.1–1.0)
Monocytes Relative: 9.2 % (ref 3.0–12.0)
Neutro Abs: 5.4 10*3/uL (ref 1.4–7.7)
Neutrophils Relative %: 61.2 % (ref 43.0–77.0)
Platelets: 187 10*3/uL (ref 150.0–400.0)
RBC: 4.47 Mil/uL (ref 4.22–5.81)
RDW: 16 % — ABNORMAL HIGH (ref 11.5–15.5)
WBC: 8.8 10*3/uL (ref 4.0–10.5)

## 2022-02-06 LAB — COMPREHENSIVE METABOLIC PANEL
ALT: 12 U/L (ref 0–53)
AST: 16 U/L (ref 0–37)
Albumin: 3.7 g/dL (ref 3.5–5.2)
Alkaline Phosphatase: 132 U/L — ABNORMAL HIGH (ref 39–117)
BUN: 24 mg/dL — ABNORMAL HIGH (ref 6–23)
CO2: 28 mEq/L (ref 19–32)
Calcium: 10.4 mg/dL (ref 8.4–10.5)
Chloride: 111 mEq/L (ref 96–112)
Creatinine, Ser: 2.13 mg/dL — ABNORMAL HIGH (ref 0.40–1.50)
GFR: 27.43 mL/min — ABNORMAL LOW (ref >60.00)
Glucose, Bld: 119 mg/dL — ABNORMAL HIGH (ref 70–99)
Potassium: 4.2 mEq/L (ref 3.5–5.1)
Sodium: 144 mEq/L (ref 135–145)
Total Bilirubin: 0.3 mg/dL (ref 0.2–1.2)
Total Protein: 7.1 g/dL (ref 6.0–8.3)

## 2022-02-06 LAB — LIPID PANEL
Cholesterol: 149 mg/dL (ref 0–200)
HDL: 48 mg/dL (ref >39.00)
LDL Cholesterol: 71 mg/dL (ref 0–99)
NonHDL: 101
Total CHOL/HDL Ratio: 3
Triglycerides: 149 mg/dL (ref 0.0–149.0)
VLDL: 29.8 mg/dL (ref 0.0–40.0)

## 2022-02-06 LAB — HEMOGLOBIN A1C: Hgb A1c MFr Bld: 6.6 % — ABNORMAL HIGH (ref 4.6–6.5)

## 2022-02-06 NOTE — Progress Notes (Signed)
Established Patient Office Visit  Subjective   Patient ID: [name], male    DOB: 12-03-1932  Age: 88 y.o. MRN: [id number]  Chief Complaint  Patient presents with   Annual Exam    HPI   [name] is here with wife for medical follow-up.  He was seen about a year ago for cough but has not had regular medical follow-up in over a year.  He has scheduled follow-up with cardiology tomorrow.  He basically has run out of several medications including Eliquis and metoprolol.  His wife reports that he has not taken Eliquis for over a year.  They plan to discuss with cardiology tomorrow.  He has past medical history significant for hypertension, paroxysmal atrial fibrillation, sick sinus syndrome, history of stroke, hyperlipidemia, ongoing nicotine use.  Medications reviewed.  Currently out of Eliquis and metoprolol.  He ran out of metoprolol about a week ago and out of Eliquis over a year ago.  He has had some myalgias which is a wonder if this is related to the statin.  Takes Lipitor 40 mg daily.  He does remain on Xtandi per urology and they understand that this also could cause general weakness and musculoskeletal complaints.  No recent falls.  Wife states that he is very sedentary and inactive.  She would like to set up some physical therapy if possible.  Past Medical History:  Diagnosis Date   Disturbances of sulphur-bearing amino-acid metabolism    Malignant neoplasm of prostate ([place])    Obstructive sleep apnea (adult) (pediatric) 2004   surgery to coorrect   Osteoarthrosis, unspecified whether generalized or localized, unspecified site    Other and unspecified hyperlipidemia    Other specified cardiac dysrhythmias(427.89)    Pacemaker 04/14/2013   DUAL CHAMBER PACEMAKER   Paroxysmal atrial fibrillation ([place])    Unspecified cerebral artery occlusion with cerebral infarction    Unspecified disorder resulting from impaired renal function    Unspecified essential  hypertension    Wears glasses    Wears partial dentures    upper partial   Past Surgical History:  Procedure Laterality Date   APPLICATION OF A-CELL OF EXTREMITY Right 08/12/2013   Procedure: APPLICATION OF A-CELL OF EXTREMITY;  Surgeon: Theodoro Kos, DO;  Location: [place];  Service: Plastics;  Laterality: Right;   CHOLECYSTECTOMY  1987   COLONOSCOPY     ent surgery  2004   correct snoring   INGUINAL HERNIA REPAIR     left   INSERT / REPLACE / REMOVE PACEMAKER  04/14/2013   dual chamber   / Dr Sallyanne Kuster   MINOR IRRIGATION AND DEBRIDEMENT OF WOUND Right 03/04/2013   Procedure: MINOR IRRIGATION AND DEBRIDEMENT OF WOUND;  Surgeon: Theodoro Kos, DO;  Location: [place];  Service: Plastics;  Laterality: Right;   ORCHIECTOMY  2002   PERMANENT PACEMAKER INSERTION N/A 04/14/2013   Procedure: PERMANENT PACEMAKER INSERTION;  Surgeon: [name], MD;  Location: [facility] CATH LAB;  Service: Cardiovascular;  Laterality: N/A;   SKIN SPLIT GRAFT Right 08/12/2013   Procedure: SKIN GRAFT SPLIT THICKNESS WITH PLACEMENT OF VAC TO RIGHT LOWER LEG/PLACEMENT OF ACELL TO RIGHT UPPER THIGH AREA (HARVEST SITE);  Surgeon: Theodoro Kos, DO;  Location: [place];  Service: Plastics;  Laterality: Right;    reports that he has been smoking cigarettes. He has a 15.00 pack-year smoking history. He has never used smokeless tobacco. He reports that he does not drink alcohol and does not use  drugs. family history includes Cancer in his father and sister; Prostate cancer in his father. No Known Allergies  Review of Systems  Constitutional:  Positive for malaise/fatigue. Negative for chills and fever.  Eyes:  Negative for blurred vision.  Respiratory:  Negative for cough and hemoptysis.   Cardiovascular:  Negative for chest pain.  Neurological:  Positive for weakness. Negative for dizziness, tremors, focal weakness and headaches.      Objective:     BP (!) 144/86  (BP Location: Left Arm, Patient Position: Sitting, Cuff Size: Normal)   Pulse 82   Temp (!) 97.5 F (36.4 C) (Oral)   Ht 5' 8.9" (1.75 m)   Wt 208 lb 4.8 oz (94.5 kg)   SpO2 99%   BMI 30.85 kg/m  BP Readings from Last 3 Encounters:  02/06/22 (!) 144/86  09/12/21 130/80  04/13/21 (!) 177/106   Wt Readings from Last 3 Encounters:  02/06/22 208 lb 4.8 oz (94.5 kg)  09/12/21 207 lb 3.2 oz (94 kg)  03/10/21 207 lb 11.2 oz (94.2 kg)      Physical Exam Vitals reviewed.  Cardiovascular:     Comments: Irregular rhythm with rate around 88 Pulmonary:     Effort: Pulmonary effort is normal.     Comments: Slightly diminished breath sounds throughout.  No rales. Musculoskeletal:     Comments: Trace edema feet and ankles bilaterally.  No pitting edema.  Neurological:     Mental Status: He is alert.      No results found for any visits on 02/06/22.    The ASCVD Risk score (Arnett DK, et al., 2019) failed to calculate for the following reasons:   The 2019 ASCVD risk score is only valid for ages 34 to 66   The patient has a prior MI or stroke diagnosis    Assessment & Plan:   #1 history of atrial fibrillation.  Appears to be in atrial fibrillation at this time.  Has recently run out of metoprolol and has been off Eliquis for over a year.  We discussed his substantial risk of stroke and they know the importance of getting back on Eliquis.  They will discuss with cardiology tomorrow They also plan to get refills of his metoprolol tomorrow -Check CBC and chemistries  #2 hypertension.  Slightly up today.  Patient off metoprolol.  Continue amlodipine and he will get back on metoprolol after evaluation with cardiology tomorrow  #3 history of prediabetes range blood sugars.  Recheck A1c with labs   #4 hyperlipidemia treated with atorvastatin 40 mg daily.  Check lipids and hepatic panel  #5 generalized weakness.  Some of this could potentially be related to his [name] as far as his  musculoskeletal complaints but he has been sedentary for some time.  Set up outpatient physical therapy for general strengthening and mobility  Health maintenance-flu vaccine given.  Patient already had RSV  -Set up 29-monthfollow-up   Return in about 6 months (around 08/07/2022).    BCarolann Littler MD

## 2022-02-06 NOTE — Addendum Note (Signed)
Addended by: Nilda Riggs on: 02/06/2022 09:47 AM   Modules accepted: Orders

## 2022-02-06 NOTE — Patient Instructions (Signed)
Could try OTC Coenzyme Q 10 100 to 200 mg once daily.    We have placed referral to physical therapy.    You need to get back on the Eliquis!- discuss with cardiology.

## 2022-02-06 NOTE — Progress Notes (Unsigned)
Cardiology Clinic Note   Patient Name: [name] Date of Encounter: 02/07/2022  Primary Care Provider:  [name], MD Primary Cardiologist:  [name], MD  Patient Profile    [name] 88 year old male presents to the clinic today for follow-up evaluation of his essential hypertension and paroxysmal atrial fibrillation.  Past Medical History    Past Medical History:  Diagnosis Date   Disturbances of sulphur-bearing amino-acid metabolism    Malignant neoplasm of prostate ([place])    Obstructive sleep apnea (adult) (pediatric) 2004   surgery to coorrect   Osteoarthrosis, unspecified whether generalized or localized, unspecified site    Other and unspecified hyperlipidemia    Other specified cardiac dysrhythmias(427.89)    Pacemaker 04/14/2013   DUAL CHAMBER PACEMAKER   Paroxysmal atrial fibrillation ([place])    Unspecified cerebral artery occlusion with cerebral infarction    Unspecified disorder resulting from impaired renal function    Unspecified essential hypertension    Wears glasses    Wears partial dentures    upper partial   Past Surgical History:  Procedure Laterality Date   APPLICATION OF A-CELL OF EXTREMITY Right 08/12/2013   Procedure: APPLICATION OF A-CELL OF EXTREMITY;  Surgeon: Theodoro Kos, DO;  Location: [place];  Service: Plastics;  Laterality: Right;   CHOLECYSTECTOMY  1987   COLONOSCOPY     ent surgery  2004   correct snoring   INGUINAL HERNIA REPAIR     left   INSERT / REPLACE / REMOVE PACEMAKER  04/14/2013   dual chamber   / Dr Sallyanne Kuster   MINOR IRRIGATION AND DEBRIDEMENT OF WOUND Right 03/04/2013   Procedure: MINOR IRRIGATION AND DEBRIDEMENT OF WOUND;  Surgeon: Theodoro Kos, DO;  Location: [place];  Service: Plastics;  Laterality: Right;   ORCHIECTOMY  2002   PERMANENT PACEMAKER INSERTION N/A 04/14/2013   Procedure: PERMANENT PACEMAKER INSERTION;  Surgeon: [name], MD;   Location: [facility] CATH LAB;  Service: Cardiovascular;  Laterality: N/A;   SKIN SPLIT GRAFT Right 08/12/2013   Procedure: SKIN GRAFT SPLIT THICKNESS WITH PLACEMENT OF VAC TO RIGHT LOWER LEG/PLACEMENT OF ACELL TO RIGHT UPPER THIGH AREA (HARVEST SITE);  Surgeon: Theodoro Kos, DO;  Location: [place];  Service: Plastics;  Laterality: Right;    Allergies  No Known Allergies  History of Present Illness    [name] has a PMH of essential hypertension, PAF(on eliquis), tachybradycardia syndrome, bilateral carotid artery stenosis, OSA, PPM ([employer] 04/14/13), and edema.   His PMH also includes HTN, HLD, OSA and prior TIA.  He was admitted 03/18/2019-03/20/2019 with right arm weakness and aphasia.   He presented to the emergency department on 03/18/2019 via EMS after an episode of slurred speech and right arm weakness that started around 11 AM.  His wife was concerned for CVA and called EMS.  When EMS arrived his symptoms had resolved.  At that time he did not have any other complaint.  No seizure activity was noted.  He was noted to be slightly lethargic and had generalized weakness.  His speech had returned to normal.  He had had no recent travel or any sick contacts.   He was seen by Dr. Sallyanne Kuster on 03/20/2019 during his hospital admission.  At that time he was feeling well, had no chest pain, shortness of breath, headaches, lightheadedness or dizziness.  His anticoagulation was continued.  A download from his pacemaker showed he had been in atrial fibrillation less than  1% over the past year and his last episode was over a month ago.  His pacemaker was adjusted so that he may have an MRI.  He was continued on his metoprolol, amlodipine, and losartan.  His creatinine was stable at 2.4.   Hewas seen virtually 07/14/2019 and stated he felt well.  He had been very sedentary and his wife stated that he was "lazy".  He had been taking naps through the day, had been less active, and was  sleeping late.  He  used CPAP in the past and was in the process of starting to use his CPAP again with Dr. Brett Fairy.  He was also been having some knee discomfort and asked about bilateral knee injections.  His blood pressure was  elevated.  When asked about his blood pressure medication he stated he had not taken it that day and that he had been out of it for the past couple of days.  He presented to the pharmacy that morning however the medication had not been refilled.  He contacted Dr. Anastasio Auerbach office for the refill.  We refilled his metoprolol tartrate.   He was seen virtually 07/28/2019 in follow-up and stated he felt well.  He had been taking his metoprolol as prescribed and noticed that his blood pressure was in the 150s over 80s.  He had increased his physical activity to 15 minutes of walking per day and continued to eat a low-sodium diet.  He was in the process of being reevaluated for sleep apnea.  On 07/26/2019 he repeated his sleep study and is waiting on results from Dr. Edwena Felty office.  I  increased his metoprolol to 37.5 twice daily, gave him a scale, gave salty 6 and planned follow-up in 1 month.   He was seen in follow-up by Dr. Sallyanne Kuster on 02/01/2020.  His pacemaker was functioning normally.  Estimated longevity was 2.5 years.  His device has been implanted in 2014.  He reported labile blood pressures since starting Xtandi.  He denied issues with pain.  He denied bleeding issues.  He had been diagnosed with OSA.  His blood pressure was 127/89.  His EKG showed atrial paced, ventricular sensed rhythm.  His episodes of atrial fibrillation were frequent but brief.  He was rate controlled.  He denied bleeding issues.  CHA2DS2-VASc score 5.  His every 74-monthdownloads were continued.  It was felt that his fatigue was related to his OSA.  His creatinine was improved to 1.4.  His Eliquis was changed to reduced dosing.  He presents to the clinic today for follow-up evaluation states he feels  fairly well.  His wife reports that he has not been taking apixaban for quite some time.  She reports that their medications are too expensive.  They are pain [facility]6000 [facility] not able to pay for Eliquis as well.  I will give them the patient assistance paperwork and samples today.  We reviewed options for starting Coumadin.  He has been sedentary at home.  He did enjoy playing golf.  I have encouraged him to go out and play golf/hit balls and move more.  His EKG today shows a heart rate of 105, atrial fibrillation with RVR.  He has not yet taken his metoprolol this morning.  He denies bleeding issues.  We will plan follow-up in 3 months.  Today he denies chest pain, shortness of breath, lower extremity edema, fatigue, palpitations, melena, hematuria, hemoptysis, diaphoresis, weakness, presyncope, syncope, orthopnea, and PND.  Home Medications    Prior to Admission medications   Medication Sig Start Date End Date Taking? Authorizing Provider  acetaminophen (TYLENOL) 325 MG tablet Take 2 tablets (650 mg total) by mouth every 6 (six) hours as needed. 04/13/21   Jeanell Sparrow, DO  albuterol (VENTOLIN HFA) 108 (90 Base) MCG/ACT inhaler Inhale 2 puffs into the lungs every 6 (six) hours as needed for wheezing or shortness of breath. 03/10/21   Burchette, Alinda Sierras, MD  amLODipine (NORVASC) 5 MG tablet Take 1 tablet (5 mg total) by mouth daily. 01/30/22   Croitoru, Mihai, MD  apixaban (ELIQUIS) 2.5 MG TABS tablet TAKE 1 TABLET(2.5 MG) BY MOUTH TWICE DAILY 12/19/20   Croitoru, Mihai, MD  Ascorbic Acid (VITAMIN C) 1000 MG tablet Take 1,000 mg by mouth daily.    [provider]  aspirin EC 81 MG EC tablet Take 1 tablet (81 mg total) by mouth daily. 03/20/19   Geradine Girt, DO  atorvastatin (LIPITOR) 40 MG tablet TAKE 1 TABLET EVERY DAY  AT  6PM 06/22/21   Croitoru, Mihai, MD  cholecalciferol (VITAMIN D3) 25 MCG (1000 UNIT) tablet Take 1,000 Units by mouth daily.    [provider]  metoprolol tartrate (LOPRESSOR) 50 MG tablet Take 1 tablet (50 mg total) by mouth 2 (two) times daily. 01/30/22   Croitoru, Mihai, MD  Multiple Vitamin (MULTIVITAMIN WITH MINERALS) TABS tablet Take 1 tablet by mouth daily.    [provider]  predniSONE (DELTASONE) 20 MG tablet Take two tablets once daily for 5 days Patient not taking: Reported on 09/12/2021 03/10/21   [name], MD  XTANDI 40 MG capsule Take 80 mg by mouth daily.  11/13/17   [provider]    Family History    Family History  Problem Relation Age of Onset   Prostate cancer Father    Cancer Father        prostate cancer   Cancer Sister        ovarian cancer   He indicated that his mother is deceased. He indicated that his father is deceased. He indicated that his sister is deceased.  Social History    Social History   Socioeconomic History   Marital status: Married    Spouse name: Consulting civil engineer   Number of children: Not on file   Years of education: Not on file   Highest education level: Not on file  Occupational History   Occupation: Retired   Occupation: RETIRED    Employer: RETIRED  Tobacco Use   Smoking status: Some Days    Packs/day: 0.50    Years: 30.00    Total pack years: 15.00    Types: Cigarettes   Smokeless tobacco: Never   Tobacco comments:     pt stated he smokes 1 pack every 2 wks  Vaping Use   Vaping Use: Never used  Substance and Sexual Activity   Alcohol use: No   Drug use: No   Sexual activity: Not on file    Comment: started smoking 2-3 cig daily  Other Topics Concern   Not on file  Social History Narrative   Not on file   Social Determinants of Health   Financial Resource Strain: Low Risk  (09/12/2021)   Overall Financial Resource Strain (CARDIA)    Difficulty of Paying Living Expenses: Not hard at all  Food Insecurity: No Food Insecurity (09/12/2021)   Hunger Vital Sign    Worried About Running Out of Food  in the Last Year: Never true    [place] in the Last Year: Never true  Transportation Needs: No Transportation Needs (09/12/2021)   PRAPARE - Hydrologist (Medical): No    Lack of Transportation (Non-Medical): No  Physical Activity: Inactive (09/12/2021)   Exercise Vital Sign    Days of Exercise per Week: 0 days    Minutes of Exercise per Session: 0 min  Stress: No Stress Concern Present (09/12/2021)   [place]    Feeling of Stress : Not at all  Social Connections: Moderately Isolated (09/06/2020)   Social Connection and Isolation Panel [NHANES]    Frequency of Communication with Friends and Family: Twice a week    Frequency of Social Gatherings with Friends and Family: Never    Attends Religious Services: More than 4 times per year    Active Member of [employer] or Organizations: No    Attends Archivist Meetings: Never    Marital Status: Married  Human resources officer Violence: Not At Risk (09/06/2020)   Humiliation, Afraid, Rape, and Kick questionnaire    Fear of Current or Ex-Partner: No    Emotionally Abused: No    Physically Abused: No    Sexually Abused: No     Review of Systems    General:  No chills, fever, night sweats or weight changes.  Cardiovascular:  No chest pain, dyspnea on exertion, edema, orthopnea, palpitations, paroxysmal nocturnal dyspnea. Dermatological: No rash, lesions/masses Respiratory: No cough, dyspnea Urologic: No hematuria, dysuria Abdominal:   No nausea, vomiting, diarrhea, bright red blood per rectum, melena, or hematemesis Neurologic:  No visual changes, wkns, changes in mental status. All other systems reviewed and are otherwise negative except as noted above.  Physical Exam    VS:  BP 136/84   Pulse (!) 105   Ht '5\' 9"'$  (1.753 m)   Wt 209 lb (94.8 kg)   SpO2 97%   BMI 30.86 kg/m  , BMI Body mass index is 30.86 kg/m. GEN: Well nourished, well developed, in no acute  distress. HEENT: normal. Neck: Supple, no JVD, carotid bruits, or masses. Cardiac: RRR, no murmurs, rubs, or gallops. No clubbing, cyanosis, ankle edema.  Radials/DP/PT 2+ and equal bilaterally.  Respiratory:  Respirations regular and unlabored, clear to auscultation bilaterally. GI: Soft, nontender, nondistended, BS + x 4. MS: no deformity or atrophy. Skin: warm and dry, no rash. Neuro:  Strength and sensation are intact. Psych: Normal affect.  Accessory Clinical Findings    Recent Labs: 02/06/2022: ALT 12; BUN 24; Creatinine, Ser 2.13; Hemoglobin 13.0; Platelets 187.0; Potassium 4.2; Sodium 144   Recent Lipid Panel    Component Value Date/Time   CHOL 149 02/06/2022 0932   CHOL 181 05/12/2019 0945   TRIG 149.0 02/06/2022 0932   TRIG 77 04/11/2006 0906   HDL 48.00 02/06/2022 0932   HDL 42 05/12/2019 0945   CHOLHDL 3 02/06/2022 0932   VLDL 29.8 02/06/2022 0932   LDLCALC 71 02/06/2022 0932   LDLCALC 103 (H) 05/12/2019 0945   LDLDIRECT 112.0 05/17/2017 0855         ECG personally reviewed by me today-demand pacemaker atrial fibrillation with RVR with premature aberrantly conducted complexes 105 bpm  EKG 03/19/2019 Sinus rhythm 71 bpm   Echocardiogram 03/18/19: IMPRESSIONS      1. Left ventricular ejection fraction, by visual estimation, is 60 to 65%. The left ventricle has normal function. There is  moderately increased left ventricular hypertrophy of the basal septum.  2. There is akinesis of the basal inferior wall.  3. Left ventricular diastolic parameters are consistent with Grade I diastolic dysfunction (impaired relaxation).  4. The right ventricular size is not well visualized. No increase in right ventricular wall thickness.  5. Left atrial size was normal.  6. Right atrial size was normal.  7. The mitral valve is normal in structure. Mild mitral valve regurgitation. No evidence of mitral stenosis.  8. The tricuspid valve is normal in structure. Tricuspid valve  regurgitation is not demonstrated.  9. The aortic valve is normal in structure. Aortic valve regurgitation is mild. Mild aortic valve sclerosis without stenosis. 10. The pulmonic valve was normal in structure. Pulmonic valve regurgitation is trivial. 11. The inferior vena cava is normal in size with greater than 50% respiratory variability, suggesting right atrial pressure of 3 mmHg. 12. TR signal is inadequate for assessing pulmonary artery systolic pressure.  Assessment & Plan   1.  Essential hypertension-BP today  136/84.     Continue metoprolol Continue to ncrease physical activity as tolerated-15 minutes of mild to moderate physical activity daily. Daily weights-scale given.    Paroxysmal atrial fibrillation/sick sinus syndrome -PPM 04/2013.  Heart rate today .  Reports that with other medications he is unable to afford Eliquis.  He has been off of it for some time.  He brings in his remote device download machine today for evaluation.  I will give to Dr. Loletha Grayer. Continue Eliquis 2.5 mg, metoprolol  Heart healthy low-sodium diet Increase physical activity as tolerated Eliquis samples given and patient assistance paperwork     Obstructive sleep apnea-compliant with CPAP.  Waking up well rested.     Working with Dr. Brett Fairy o Sleep hygiene reviewed   TIA/CVA -03/18/2019 presented to the emergency department after an a bout of slurred speech and right arm weakness.  Thought to be left brain TIA with source believed to be ICA siphon stenosis, left more than right.  MRI showed punctate acute cortical infarct versus artifact within the left superior frontal gyrus. Continue atorvastatin 40 mg daily Followed by neurology  Disposition: Follow-up with Dr. Loletha Grayer in 3 months.   Jossie Ng. Holman Bonsignore NP-C     02/07/2022, 8:43 AM [address] Office [phone] Fax [phone]  Notice: This dictation was prepared with Dragon dictation along with  smaller phrase technology. Any transcriptional errors that result from this process are unintentional and may not be corrected upon review.  I spent 14 minutes examining this patient, reviewing medications, and using patient centered shared decision making involving her cardiac care.  Prior to her visit I spent greater than 20 minutes reviewing her past medical history,  medications, and prior cardiac tests.

## 2022-02-07 ENCOUNTER — Telehealth: Payer: Self-pay | Admitting: *Deleted

## 2022-02-07 ENCOUNTER — Encounter: Payer: Self-pay | Admitting: General Practice

## 2022-02-07 ENCOUNTER — Ambulatory Visit: Payer: Medicare HMO | Attending: General Practice | Admitting: General Practice

## 2022-02-07 ENCOUNTER — Telehealth: Payer: Self-pay | Admitting: Family Medicine

## 2022-02-07 VITALS — BP 136/84 | HR 105 | Ht 69.0 in | Wt 209.0 lb

## 2022-02-07 DIAGNOSIS — G4733 Obstructive sleep apnea (adult) (pediatric): Secondary | ICD-10-CM

## 2022-02-07 DIAGNOSIS — I10 Essential (primary) hypertension: Secondary | ICD-10-CM

## 2022-02-07 DIAGNOSIS — I495 Sick sinus syndrome: Secondary | ICD-10-CM

## 2022-02-07 DIAGNOSIS — I480 Paroxysmal atrial fibrillation: Secondary | ICD-10-CM

## 2022-02-07 DIAGNOSIS — G459 Transient cerebral ischemic attack, unspecified: Secondary | ICD-10-CM

## 2022-02-07 NOTE — Telephone Encounter (Signed)
Pt's wife called to say Pt was just here yesterday and forgot to tell MD he needs a Cortisone Shot.  Please advise.

## 2022-02-07 NOTE — Patient Instructions (Signed)
Medication Instructions:  MAKE SURE TO TAKE YOU ELIQUIS 2.'5MG'$  TWICE DAILY *If you need a refill on your cardiac medications before your next appointment, please call your pharmacy*   Lab Work: NONE  Other Instructions PLEASE FILL OUT PT ASSISTANCE PAPERWORK AND PROVIDE PROOF OF HOUSEHOLD INCOME  INCREASE PHYSICAL ACTIVITY-AS TOLERATED, MOVE AROUND MORE  Follow-Up: At [facility], you and your health needs are our priority.  As part of our continuing mission to provide you with exceptional heart care, we have created designated Provider Care Teams.  These Care Teams include your primary Cardiologist (physician) and Advanced Practice Providers (APPs -  Physician Assistants and Nurse Practitioners) who all work together to provide you with the care you need, when you need it.  Your next appointment:   3 month(s)  The format for your next appointment:   In Person  Provider:   [name], MD    Important Information About Sugar

## 2022-02-07 NOTE — Telephone Encounter (Signed)
Made aware that the patient needs a new transmitter for his device. [employer] rep has been notified and will have one sent to the patient. Patient has been made aware.

## 2022-02-08 NOTE — Telephone Encounter (Signed)
Per Leanna Sato, [employer] rep the patient needs a new monitor. I called the patient and verified his address. I ordered the monitor. The patient should receive it in 7-10 business days.

## 2022-02-09 NOTE — Telephone Encounter (Signed)
Left message for the patient to return my call.

## 2022-02-12 NOTE — Telephone Encounter (Signed)
Left a message for the pt to return my call.  

## 2022-02-13 NOTE — Progress Notes (Deleted)
Cardiology Clinic Note   Patient Name: [name] Date of Encounter: 02/13/2022  Primary Care Provider:  [name], MD Primary Cardiologist:  [name], MD  Patient Profile    [name] 88 year old male presents to the clinic today for follow-up evaluation of his essential hypertension and paroxysmal atrial fibrillation.  Past Medical History    Past Medical History:  Diagnosis Date   Disturbances of sulphur-bearing amino-acid metabolism    Malignant neoplasm of prostate ([place])    Obstructive sleep apnea (adult) (pediatric) 2004   surgery to coorrect   Osteoarthrosis, unspecified whether generalized or localized, unspecified site    Other and unspecified hyperlipidemia    Other specified cardiac dysrhythmias(427.89)    Pacemaker 04/14/2013   DUAL CHAMBER PACEMAKER   Paroxysmal atrial fibrillation ([place])    Unspecified cerebral artery occlusion with cerebral infarction    Unspecified disorder resulting from impaired renal function    Unspecified essential hypertension    Wears glasses    Wears partial dentures    upper partial   Past Surgical History:  Procedure Laterality Date   APPLICATION OF A-CELL OF EXTREMITY Right 08/12/2013   Procedure: APPLICATION OF A-CELL OF EXTREMITY;  Surgeon: Theodoro Kos, DO;  Location: [place];  Service: Plastics;  Laterality: Right;   CHOLECYSTECTOMY  1987   COLONOSCOPY     ent surgery  2004   correct snoring   INGUINAL HERNIA REPAIR     left   INSERT / REPLACE / REMOVE PACEMAKER  04/14/2013   dual chamber   / Dr Sallyanne Kuster   MINOR IRRIGATION AND DEBRIDEMENT OF WOUND Right 03/04/2013   Procedure: MINOR IRRIGATION AND DEBRIDEMENT OF WOUND;  Surgeon: Theodoro Kos, DO;  Location: [place];  Service: Plastics;  Laterality: Right;   ORCHIECTOMY  2002   PERMANENT PACEMAKER INSERTION N/A 04/14/2013   Procedure: PERMANENT PACEMAKER INSERTION;  Surgeon: [name], MD;   Location: [place] CATH [facility];  Service: Cardiovascular;  Laterality: N/A;   SKIN SPLIT GRAFT Right 08/12/2013   Procedure: SKIN GRAFT SPLIT THICKNESS WITH PLACEMENT OF VAC TO RIGHT LOWER LEG/PLACEMENT OF ACELL TO RIGHT UPPER THIGH AREA (HARVEST SITE);  Surgeon: Theodoro Kos, DO;  Location: [place];  Service: Plastics;  Laterality: Right;    Allergies  No Known Allergies  History of Present Illness    [name] has a PMH of essential hypertension, PAF(on eliquis), tachybradycardia syndrome, bilateral carotid artery stenosis, OSA, PPM ([employer] 04/14/13), and edema.   His PMH also includes HTN, HLD, OSA and prior TIA.  He was admitted 03/18/2019-03/20/2019 with right arm weakness and aphasia.   He presented to the emergency department on 03/18/2019 via EMS after an episode of slurred speech and right arm weakness that started around 11 AM.  His wife was concerned for CVA and called EMS.  When EMS arrived his symptoms had resolved.  At that time he did not have any other complaint.  No seizure activity was noted.  He was noted to be slightly lethargic and had generalized weakness.  His speech had returned to normal.  He had had no recent travel or any sick contacts.   He was seen by Dr. Sallyanne Kuster on 03/20/2019 during his hospital admission.  At that time he was feeling well, had no chest pain, shortness of breath, headaches, lightheadedness or dizziness.  His anticoagulation was continued.  A download from his pacemaker showed he had been in atrial fibrillation less than  1% over the past year and his last episode was over a month ago.  His pacemaker was adjusted so that he may have an MRI.  He was continued on his metoprolol, amlodipine, and losartan.  His creatinine was stable at 2.4.   Hewas seen virtually 07/14/2019 and stated he felt well.  He had been very sedentary and his wife stated that he was "lazy".  He had been taking naps through the day, had been less active, and was  sleeping late.  He  used CPAP in the past and was in the process of starting to use his CPAP again with Dr. Brett Fairy.  He was also been having some knee discomfort and asked about bilateral knee injections.  His blood pressure was  elevated.  When asked about his blood pressure medication he stated he had not taken it that day and that he had been out of it for the past couple of days.  He presented to the pharmacy that morning however the medication had not been refilled.  He contacted Dr. Anastasio Auerbach office for the refill.  We refilled his metoprolol tartrate.   He was seen virtually 07/28/2019 in follow-up and stated he felt well.  He had been taking his metoprolol as prescribed and noticed that his blood pressure was in the 150s over 80s.  He had increased his physical activity to 15 minutes of walking per day and continued to eat a low-sodium diet.  He was in the process of being reevaluated for sleep apnea.  On 07/26/2019 he repeated his sleep study and is waiting on results from Dr. Edwena Felty office.  I  increased his metoprolol to 37.5 twice daily, gave him a scale, gave salty 6 and planned follow-up in 1 month.   He was seen in follow-up by Dr. Sallyanne Kuster on 02/01/2020.  His pacemaker was functioning normally.  Estimated longevity was 2.5 years.  His device has been implanted in 2014.  He reported labile blood pressures since starting Xtandi.  He denied issues with pain.  He denied bleeding issues.  He had been diagnosed with OSA.  His blood pressure was 127/89.  His EKG showed atrial paced, ventricular sensed rhythm.  His episodes of atrial fibrillation were frequent but brief.  He was rate controlled.  He denied bleeding issues.  CHA2DS2-VASc score 5.  His every 41-monthdownloads were continued.  It was felt that his fatigue was related to his OSA.  His creatinine was improved to 1.4.  His Eliquis was changed to reduced dosing.  He presents to the clinic today for follow-up evaluation states he feels  fairly well.  His wife reports that he has not been taking apixaban for quite some time.  She reports that their medications are too expensive.  [facility] Eliquis as well.  I will give them the patient assistance paperwork and samples today.  We reviewed options for starting Coumadin.  He has been sedentary at home.  He did enjoy playing golf.  I have encouraged him to go out and play golf/hit balls and move more.  His EKG today shows a heart rate of 105, atrial fibrillation with RVR.  He has not yet taken his metoprolol this morning.  He denies bleeding issues.  We will plan follow-up in 3 months.  Today he denies chest pain, shortness of breath, lower extremity edema, fatigue, palpitations, melena, hematuria, hemoptysis, diaphoresis, weakness, presyncope, syncope, orthopnea, and PND.  Home Medications    Prior to Admission medications   Medication Sig Start Date End Date Taking? Authorizing Provider  acetaminophen (TYLENOL) 325 MG tablet Take 2 tablets (650 mg total) by mouth every 6 (six) hours as needed. 04/13/21   Jeanell Sparrow, DO  albuterol (VENTOLIN HFA) 108 (90 Base) MCG/ACT inhaler Inhale 2 puffs into the lungs every 6 (six) hours as needed for wheezing or shortness of breath. 03/10/21   Burchette, Alinda Sierras, MD  amLODipine (NORVASC) 5 MG tablet Take 1 tablet (5 mg total) by mouth daily. 01/30/22   Croitoru, Mihai, MD  apixaban (ELIQUIS) 2.5 MG TABS tablet TAKE 1 TABLET(2.5 MG) BY MOUTH TWICE DAILY 12/19/20   Croitoru, Mihai, MD  Ascorbic Acid (VITAMIN C) 1000 MG tablet Take 1,000 mg by mouth daily.    [provider]  aspirin EC 81 MG EC tablet Take 1 tablet (81 mg total) by mouth daily. 03/20/19   Geradine Girt, DO  atorvastatin (LIPITOR) 40 MG tablet TAKE 1 TABLET EVERY DAY  AT  6PM 06/22/21   Croitoru, Mihai, MD  cholecalciferol (VITAMIN D3) 25 MCG (1000 UNIT) tablet Take 1,000 Units by mouth daily.    [provider]  metoprolol tartrate (LOPRESSOR) 50 MG tablet Take 1 tablet (50 mg total) by mouth 2 (two) times daily. 01/30/22   Croitoru, Mihai, MD  Multiple Vitamin (MULTIVITAMIN WITH MINERALS) TABS tablet Take 1 tablet by mouth daily.    [provider]  predniSONE (DELTASONE) 20 MG tablet Take two tablets once daily for 5 days Patient not taking: Reported on 09/12/2021 03/10/21   [name], MD  XTANDI 40 MG capsule Take 80 mg by mouth daily.  11/13/17   [provider]    Family History    Family History  Problem Relation Age of Onset   Prostate cancer Father    Cancer Father        prostate cancer   Cancer Sister        ovarian cancer   He indicated that his mother is deceased. He indicated that his father is deceased. He indicated that his sister is deceased.  Social History    Social History   Socioeconomic History   Marital status: Married    Spouse name: Consulting civil engineer   Number of children: Not on file   Years of education: Not on file   Highest education level: Not on file  Occupational History   Occupation: Retired   Occupation: RETIRED    Employer: RETIRED  Tobacco Use   Smoking status: Some Days    Packs/day: 0.50    Years: 30.00    Total pack years: 15.00    Types: Cigarettes   Smokeless tobacco: Never   Tobacco comments:     pt stated he smokes 1 pack every 2 wks  Vaping Use   Vaping Use: Never used  Substance and Sexual Activity   Alcohol use: No   Drug use: No   Sexual activity: Not on file    Comment: started smoking 2-3 cig daily  Other Topics Concern   Not on file  Social History Narrative   Not on file   Social Determinants of Health   Financial Resource Strain: Low Risk  (09/12/2021)   Overall Financial Resource Strain (CARDIA)    Difficulty of Paying Living Expenses: Not hard at all  Food Insecurity: No Food Insecurity (09/12/2021)   Hunger Vital Sign    Worried About Running Out of Food  in the Last Year: Never true    [place] in the Last Year: Never true  Transportation Needs: No Transportation Needs (09/12/2021)   PRAPARE - Hydrologist (Medical): No    Lack of Transportation (Non-Medical): No  Physical Activity: Inactive (09/12/2021)   Exercise Vital Sign    Days of Exercise per Week: 0 days    Minutes of Exercise per Session: 0 min  Stress: No Stress Concern Present (09/12/2021)   [place]    Feeling of Stress : Not at all  Social Connections: Moderately Isolated (09/06/2020)   Social Connection and Isolation Panel [NHANES]    Frequency of Communication with Friends and Family: Twice a week    Frequency of Social Gatherings with Friends and Family: Never    Attends Religious Services: More than 4 times per year    Active Member of [employer] or Organizations: No    Attends Archivist Meetings: Never    Marital Status: Married  Human resources officer Violence: Not At Risk (09/06/2020)   Humiliation, Afraid, Rape, and Kick questionnaire    Fear of Current or Ex-Partner: No    Emotionally Abused: No    Physically Abused: No    Sexually Abused: No     Review of Systems    General:  No chills, fever, night sweats or weight changes.  Cardiovascular:  No chest pain, dyspnea on exertion, edema, orthopnea, palpitations, paroxysmal nocturnal dyspnea. Dermatological: No rash, lesions/masses Respiratory: No cough, dyspnea Urologic: No hematuria, dysuria Abdominal:   No nausea, vomiting, diarrhea, bright red blood per rectum, melena, or hematemesis Neurologic:  No visual changes, wkns, changes in mental status. All other systems reviewed and are otherwise negative except as noted above.  Physical Exam    VS:  There were no vitals taken for this visit. , BMI There is no height or weight on file to calculate BMI. GEN: Well nourished, well developed, in no acute distress. HEENT: normal. Neck: Supple, no JVD,  carotid bruits, or masses. Cardiac: RRR, no murmurs, rubs, or gallops. No clubbing, cyanosis, ankle edema.  Radials/DP/PT 2+ and equal bilaterally.  Respiratory:  Respirations regular and unlabored, clear to auscultation bilaterally. GI: Soft, nontender, nondistended, BS + x 4. MS: no deformity or atrophy. Skin: warm and dry, no rash. Neuro:  Strength and sensation are intact. Psych: Normal affect.  Accessory Clinical Findings    Recent Labs: 02/06/2022: ALT 12; BUN 24; Creatinine, Ser 2.13; Hemoglobin 13.0; Platelets 187.0; Potassium 4.2; Sodium 144   Recent Lipid Panel    Component Value Date/Time   CHOL 149 02/06/2022 0932   CHOL 181 05/12/2019 0945   TRIG 149.0 02/06/2022 0932   TRIG 77 04/11/2006 0906   HDL 48.00 02/06/2022 0932   HDL 42 05/12/2019 0945   CHOLHDL 3 02/06/2022 0932   VLDL 29.8 02/06/2022 0932   LDLCALC 71 02/06/2022 0932   LDLCALC 103 (H) 05/12/2019 0945   LDLDIRECT 112.0 05/17/2017 0855    No BP recorded.  {Refresh Note OR Click here to enter BP  :1}***    ECG personally reviewed by me today-demand pacemaker atrial fibrillation with RVR with premature aberrantly conducted complexes 105 bpm  EKG 03/19/2019 Sinus rhythm 71 bpm   Echocardiogram 03/18/19: IMPRESSIONS      1. Left ventricular ejection fraction, by visual estimation, is 60 to 65%. The left ventricle has normal function. There is moderately increased left ventricular hypertrophy of  the basal septum.  2. There is akinesis of the basal inferior wall.  3. Left ventricular diastolic parameters are consistent with Grade I diastolic dysfunction (impaired relaxation).  4. The right ventricular size is not well visualized. No increase in right ventricular wall thickness.  5. Left atrial size was normal.  6. Right atrial size was normal.  7. The mitral valve is normal in structure. Mild mitral valve regurgitation. No evidence of mitral stenosis.  8. The tricuspid valve is normal in structure.  Tricuspid valve regurgitation is not demonstrated.  9. The aortic valve is normal in structure. Aortic valve regurgitation is mild. Mild aortic valve sclerosis without stenosis. 10. The pulmonic valve was normal in structure. Pulmonic valve regurgitation is trivial. 11. The inferior vena cava is normal in size with greater than 50% respiratory variability, suggesting right atrial pressure of 3 mmHg. 12. TR signal is inadequate for assessing pulmonary artery systolic pressure.  Assessment & Plan   1.  Essential hypertension-BP today  136/84.     Continue metoprolol Continue to ncrease physical activity as tolerated-15 minutes of mild to moderate physical activity daily. Daily weights-scale given.    Paroxysmal atrial fibrillation/sick sinus syndrome -PPM 04/2013.  Heart rate today .  Reports that with other medications he is unable to afford Eliquis.  He has been off of it for some time.  He brings in his remote device download machine today for evaluation.  I will give to Dr. Loletha Grayer. Continue Eliquis 2.5 mg, metoprolol  Heart healthy low-sodium diet Increase physical activity as tolerated Eliquis samples given and patient assistance paperwork     Obstructive sleep apnea-compliant with CPAP.  Waking up well rested.     Working with Dr. Brett Fairy o Sleep hygiene reviewed   TIA/CVA -03/18/2019 presented to the emergency department after an a bout of slurred speech and right arm weakness.  Thought to be left brain TIA with source believed to be ICA siphon stenosis, left more than right.  MRI showed punctate acute cortical infarct versus artifact within the left superior frontal gyrus. Continue atorvastatin 40 mg daily Followed by neurology  Disposition: Follow-up with Dr. Loletha Grayer in 3 months.   Jossie Ng. Glynnis Gavel NP-C     02/13/2022, 7:30 AM [facility] [address] Office [phone] Fax [phone]  Notice: This dictation was prepared with Dragon  dictation along with smaller phrase technology. Any transcriptional errors that result from this process are unintentional and may not be corrected upon review.  I spent 14 minutes examining this patient, reviewing medications, and using patient centered shared decision making involving her cardiac care.  Prior to her visit I spent greater than 20 minutes reviewing her past medical history,  medications, and prior cardiac tests.

## 2022-02-13 NOTE — Telephone Encounter (Signed)
Patient's wife informed of the message and expressed understanding

## 2022-02-13 NOTE — Telephone Encounter (Signed)
I spoke with the patient's wife and she stated patient would like to receive right knee injection. OV scheduled.

## 2022-02-14 ENCOUNTER — Ambulatory Visit (INDEPENDENT_AMBULATORY_CARE_PROVIDER_SITE_OTHER): Payer: Medicare HMO | Admitting: Family Medicine

## 2022-02-14 ENCOUNTER — Encounter: Payer: Self-pay | Admitting: Family Medicine

## 2022-02-14 VITALS — BP 130/80 | HR 87 | Temp 98.3°F | Ht 69.0 in | Wt 207.1 lb

## 2022-02-14 DIAGNOSIS — M25561 Pain in right knee: Secondary | ICD-10-CM

## 2022-02-14 MED ORDER — METHYLPREDNISOLONE ACETATE 40 MG/ML IJ SUSP
40.0000 mg | Freq: Once | INTRAMUSCULAR | Status: AC
  Administered 2022-02-14: 40 mg via INTRAMUSCULAR

## 2022-02-14 NOTE — Patient Instructions (Signed)
Let me know if injection not helping in 1-2 weeks

## 2022-02-14 NOTE — Progress Notes (Signed)
Established Patient Office Visit  Subjective   Patient ID: ZAYN SELLEY, male    DOB: [birth year]  Age: 88 y.o. MRN: [id number]  Chief Complaint  Patient presents with   Knee Pain    HPI   Mr. Repetto is here requesting injection right knee.  He has history of some osteoarthritis involving multiple joints.  He had some stiffness and pain in the right knee for several weeks.  Denies any recent fall or injury.  He has taken some Tylenol occasionally without much improvement.  Cannot take nonsteroidals with his age and Eliquis therapy.  Denies any recent ecchymosis.  No warmth.  No history of gout.  No recent steroid injection but has benefited from this in the past.  Denies any locking or giving way of the knee.  Past Medical History:  Diagnosis Date   Disturbances of sulphur-bearing amino-acid metabolism    Malignant neoplasm of prostate ([place])    Obstructive sleep apnea (adult) (pediatric) 2004   surgery to coorrect   Osteoarthrosis, unspecified whether generalized or localized, unspecified site    Other and unspecified hyperlipidemia    Other specified cardiac dysrhythmias(427.89)    Pacemaker 04/14/2013   DUAL CHAMBER PACEMAKER   Paroxysmal atrial fibrillation ([place])    Unspecified cerebral artery occlusion with cerebral infarction    Unspecified disorder resulting from impaired renal function    Unspecified essential hypertension    Wears glasses    Wears partial dentures    upper partial   Past Surgical History:  Procedure Laterality Date   APPLICATION OF A-CELL OF EXTREMITY Right 08/12/2013   Procedure: APPLICATION OF A-CELL OF EXTREMITY;  Surgeon: Theodoro Kos, DO;  Location: [place];  Service: Plastics;  Laterality: Right;   CHOLECYSTECTOMY  1987   COLONOSCOPY     ent surgery  2004   correct snoring   INGUINAL HERNIA REPAIR     left   INSERT / REPLACE / REMOVE PACEMAKER  04/14/2013   dual chamber   / Dr Sallyanne Kuster   MINOR IRRIGATION AND  DEBRIDEMENT OF WOUND Right 03/04/2013   Procedure: MINOR IRRIGATION AND DEBRIDEMENT OF WOUND;  Surgeon: Theodoro Kos, DO;  Location: [place];  Service: Plastics;  Laterality: Right;   ORCHIECTOMY  2002   PERMANENT PACEMAKER INSERTION N/A 04/14/2013   Procedure: PERMANENT PACEMAKER INSERTION;  Surgeon: [name], MD;  Location: [place] CATH LAB;  Service: Cardiovascular;  Laterality: N/A;   SKIN SPLIT GRAFT Right 08/12/2013   Procedure: SKIN GRAFT SPLIT THICKNESS WITH PLACEMENT OF VAC TO RIGHT LOWER LEG/PLACEMENT OF ACELL TO RIGHT UPPER THIGH AREA (HARVEST SITE);  Surgeon: Theodoro Kos, DO;  Location: [place];  Service: Plastics;  Laterality: Right;    reports that he has been smoking cigarettes. He has a 15.00 pack-year smoking history. He has never used smokeless tobacco. He reports that he does not drink alcohol and does not use drugs. family history includes Cancer in his father and sister; Prostate cancer in his father. No Known Allergies  Review of Systems  Constitutional:  Negative for chills and fever.  Neurological:  Negative for weakness.      Objective:     BP 130/80 (BP Location: Left Arm, Patient Position: Sitting, Cuff Size: Large)   Pulse 87   Temp 98.3 F (36.8 C) (Oral)   Ht '5\' 9"'$  (1.753 m)   Wt 207 lb 1.6 oz (93.9 kg)   SpO2 99%   BMI 30.58 kg/m  Physical Exam Vitals reviewed.  Constitutional:      Appearance: Normal appearance.  Cardiovascular:     Rate and Rhythm: Normal rate.  Musculoskeletal:     Comments: Right knee reveals no effusion.  No warmth.  No ecchymosis.  No erythema.  Mild crepitus with flexion and extension.  Mild medial joint line tenderness.  Neurological:     Mental Status: He is alert.      No results found for any visits on 02/14/22.    The ASCVD Risk score (Arnett DK, et al., 2019) failed to calculate for the following reasons:   The 2019 ASCVD risk score is only valid for ages 36 to 38    The patient has a prior MI or stroke diagnosis    Assessment & Plan:   Right knee pain.  History of degenerative arthritis.  Patient requesting steroid injection right knee.  We discussed risk of injection including bleeding, bruising, low risk of infection.  We particularly mentioned risk of bleeding on Eliquis.  Patient has not taken Eliquis since dose yesterday morning which should reduce risk.  After discussion of risk patient consented.  -Knee was prepped with Betadine. -We injected 40 mg of Depo-Medrol and 2 cc of plain Xylocaine using 23-gauge 1-1/2 inch needle inferior and medial to the patella region.  Patient tolerated well.  No visible bruising or swelling afterwards. -Touch base if knee pain not improving over the next 1 to 2 weeks. -Obtain plain films of knee if not improved in the next couple weeks  No follow-ups on file.    Carolann Littler, MD

## 2022-02-19 ENCOUNTER — Ambulatory Visit: Payer: Medicare HMO | Admitting: General Practice

## 2022-02-21 DIAGNOSIS — C61 Malignant neoplasm of prostate: Secondary | ICD-10-CM | POA: Diagnosis not present

## 2022-03-12 DIAGNOSIS — C61 Malignant neoplasm of prostate: Secondary | ICD-10-CM | POA: Diagnosis not present

## 2022-03-12 DIAGNOSIS — R3121 Asymptomatic microscopic hematuria: Secondary | ICD-10-CM | POA: Diagnosis not present

## 2022-03-14 ENCOUNTER — Other Ambulatory Visit: Payer: Self-pay

## 2022-03-14 ENCOUNTER — Encounter: Payer: Self-pay | Admitting: Rehabilitative and Restorative Service Providers"

## 2022-03-14 ENCOUNTER — Ambulatory Visit: Payer: Medicare HMO | Attending: Family Medicine | Admitting: Rehabilitative and Restorative Service Providers"

## 2022-03-14 DIAGNOSIS — M6281 Muscle weakness (generalized): Secondary | ICD-10-CM | POA: Diagnosis not present

## 2022-03-14 DIAGNOSIS — R262 Difficulty in walking, not elsewhere classified: Secondary | ICD-10-CM | POA: Diagnosis not present

## 2022-03-14 DIAGNOSIS — R2689 Other abnormalities of gait and mobility: Secondary | ICD-10-CM | POA: Diagnosis not present

## 2022-03-14 DIAGNOSIS — M25561 Pain in right knee: Secondary | ICD-10-CM | POA: Diagnosis not present
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
## 2022-03-19 ENCOUNTER — Telehealth: Payer: Self-pay

## 2022-03-19 ENCOUNTER — Encounter: Payer: Self-pay | Admitting: Physical Therapy

## 2022-03-19 ENCOUNTER — Ambulatory Visit: Payer: Medicare HMO | Admitting: Physical Therapy

## 2022-03-19 DIAGNOSIS — M6281 Muscle weakness (generalized): Secondary | ICD-10-CM

## 2022-03-19 DIAGNOSIS — M25561 Pain in right knee: Secondary | ICD-10-CM

## 2022-03-19 DIAGNOSIS — R262 Difficulty in walking, not elsewhere classified: Secondary | ICD-10-CM

## 2022-03-19 DIAGNOSIS — R2689 Other abnormalities of gait and mobility: Secondary | ICD-10-CM

## 2022-03-19 NOTE — Telephone Encounter (Signed)
Pt's wife Remo Lipps walks into office this morning to return pt assistance forms and request samples.  Medication samples have been provided to the patient.  Drug name: Eliquis 2.'5mg'$  Qty: 3 boxes LOT: [id number] Exp.Date: 08/2022  Samples provided.
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
## 2022-03-19 NOTE — Telephone Encounter (Signed)
Spoke with pt's wife (ok per DPR) regarding pt assistance forms. Explained that we need a print out from their pharmacy showing their out of pocket expenses. [name] verbalizes understanding.
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
## 2022-03-22 ENCOUNTER — Encounter: Payer: Self-pay | Admitting: Physical Therapy

## 2022-03-22 ENCOUNTER — Ambulatory Visit: Payer: Medicare HMO | Admitting: Physical Therapy

## 2022-03-22 DIAGNOSIS — M25561 Pain in right knee: Secondary | ICD-10-CM | POA: Diagnosis not present

## 2022-03-22 DIAGNOSIS — M6281 Muscle weakness (generalized): Secondary | ICD-10-CM

## 2022-03-22 DIAGNOSIS — R262 Difficulty in walking, not elsewhere classified: Secondary | ICD-10-CM

## 2022-03-22 DIAGNOSIS — R2689 Other abnormalities of gait and mobility: Secondary | ICD-10-CM | POA: Diagnosis not present
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
## 2022-03-27 ENCOUNTER — Ambulatory Visit: Payer: Medicare HMO

## 2022-03-27 ENCOUNTER — Telehealth: Payer: Self-pay

## 2022-03-27 DIAGNOSIS — M6281 Muscle weakness (generalized): Secondary | ICD-10-CM

## 2022-03-27 DIAGNOSIS — R2689 Other abnormalities of gait and mobility: Secondary | ICD-10-CM

## 2022-03-27 DIAGNOSIS — R262 Difficulty in walking, not elsewhere classified: Secondary | ICD-10-CM | POA: Diagnosis not present

## 2022-03-27 DIAGNOSIS — M25561 Pain in right knee: Secondary | ICD-10-CM | POA: Diagnosis not present

## 2022-03-27 MED ORDER — APIXABAN 2.5 MG PO TABS: ORAL_TABLET | ORAL | 1 refills | Status: DC

## 2022-03-27 NOTE — Telephone Encounter (Signed)
[name] pt assistance forms filled out and signed per Dr. Sallyanne Kuster. Faxed to [employer] with confirmation received.
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
## 2022-03-30 ENCOUNTER — Ambulatory Visit: Payer: Medicare HMO | Admitting: Rehabilitative and Restorative Service Providers"

## 2022-03-30 ENCOUNTER — Encounter: Payer: Self-pay | Admitting: Rehabilitative and Restorative Service Providers"

## 2022-03-30 DIAGNOSIS — R262 Difficulty in walking, not elsewhere classified: Secondary | ICD-10-CM

## 2022-03-30 DIAGNOSIS — R2689 Other abnormalities of gait and mobility: Secondary | ICD-10-CM

## 2022-03-30 DIAGNOSIS — M6281 Muscle weakness (generalized): Secondary | ICD-10-CM | POA: Diagnosis not present

## 2022-03-30 DIAGNOSIS — M25561 Pain in right knee: Secondary | ICD-10-CM
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
## 2022-04-02 ENCOUNTER — Ambulatory Visit: Payer: Medicare HMO | Admitting: Rehabilitative and Restorative Service Providers"

## 2022-04-02 ENCOUNTER — Telehealth: Payer: Self-pay | Admitting: Rehabilitative and Restorative Service Providers"

## 2022-04-02 NOTE — Telephone Encounter (Signed)
Patient called and cancelled appointment not felling well.

## 2022-04-04 ENCOUNTER — Ambulatory Visit: Payer: Medicare HMO | Admitting: Rehabilitative and Restorative Service Providers"

## 2022-04-10 ENCOUNTER — Encounter: Payer: Self-pay | Admitting: Rehabilitative and Restorative Service Providers"

## 2022-04-10 ENCOUNTER — Ambulatory Visit: Payer: Medicare HMO | Admitting: Rehabilitative and Restorative Service Providers"

## 2022-04-10 DIAGNOSIS — M25561 Pain in right knee: Secondary | ICD-10-CM

## 2022-04-10 DIAGNOSIS — M6281 Muscle weakness (generalized): Secondary | ICD-10-CM

## 2022-04-10 DIAGNOSIS — R262 Difficulty in walking, not elsewhere classified: Secondary | ICD-10-CM | POA: Diagnosis not present

## 2022-04-10 DIAGNOSIS — R2689 Other abnormalities of gait and mobility: Secondary | ICD-10-CM
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
## 2022-04-12 ENCOUNTER — Ambulatory Visit: Payer: Medicare HMO

## 2022-04-12 DIAGNOSIS — M25561 Pain in right knee: Secondary | ICD-10-CM

## 2022-04-12 DIAGNOSIS — R2689 Other abnormalities of gait and mobility: Secondary | ICD-10-CM | POA: Diagnosis not present

## 2022-04-12 DIAGNOSIS — M6281 Muscle weakness (generalized): Secondary | ICD-10-CM | POA: Diagnosis not present

## 2022-04-12 DIAGNOSIS — R262 Difficulty in walking, not elsewhere classified: Secondary | ICD-10-CM | POA: Diagnosis not present
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
## 2022-04-15 ENCOUNTER — Other Ambulatory Visit: Payer: Self-pay | Admitting: Cardiovascular Disease

## 2022-04-17 ENCOUNTER — Ambulatory Visit: Payer: Medicare HMO | Attending: Family Medicine | Admitting: Rehabilitative and Restorative Service Providers"

## 2022-04-17 ENCOUNTER — Encounter: Payer: Self-pay | Admitting: Rehabilitative and Restorative Service Providers"

## 2022-04-17 DIAGNOSIS — M6281 Muscle weakness (generalized): Secondary | ICD-10-CM | POA: Insufficient documentation

## 2022-04-17 DIAGNOSIS — R2689 Other abnormalities of gait and mobility: Secondary | ICD-10-CM | POA: Insufficient documentation

## 2022-04-17 DIAGNOSIS — R262 Difficulty in walking, not elsewhere classified: Secondary | ICD-10-CM | POA: Insufficient documentation

## 2022-04-17 DIAGNOSIS — M25561 Pain in right knee: Secondary | ICD-10-CM | POA: Diagnosis not present
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
## 2022-04-18 ENCOUNTER — Telehealth: Payer: Self-pay

## 2022-04-18 NOTE — Telephone Encounter (Signed)
Spoke to patient's wife Eliquis 5 mg samples left at [employer] office front desk.Advised take 1/2 tablet ( 2.5 mg ) twice a day.

## 2022-04-19 ENCOUNTER — Encounter: Payer: Self-pay | Admitting: Rehabilitative and Restorative Service Providers"

## 2022-04-19 ENCOUNTER — Other Ambulatory Visit: Payer: Self-pay | Admitting: Cardiovascular Disease

## 2022-04-19 ENCOUNTER — Ambulatory Visit (INDEPENDENT_AMBULATORY_CARE_PROVIDER_SITE_OTHER): Payer: Medicare HMO

## 2022-04-19 ENCOUNTER — Ambulatory Visit: Payer: Medicare HMO | Admitting: Rehabilitative and Restorative Service Providers"

## 2022-04-19 DIAGNOSIS — M25561 Pain in right knee: Secondary | ICD-10-CM | POA: Diagnosis not present

## 2022-04-19 DIAGNOSIS — R262 Difficulty in walking, not elsewhere classified: Secondary | ICD-10-CM | POA: Diagnosis not present

## 2022-04-19 DIAGNOSIS — I495 Sick sinus syndrome: Secondary | ICD-10-CM | POA: Diagnosis not present

## 2022-04-19 DIAGNOSIS — R2689 Other abnormalities of gait and mobility: Secondary | ICD-10-CM

## 2022-04-19 DIAGNOSIS — M6281 Muscle weakness (generalized): Secondary | ICD-10-CM | POA: Diagnosis not present
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
## 2022-04-24 ENCOUNTER — Ambulatory Visit: Payer: Medicare HMO | Admitting: Rehabilitative and Restorative Service Providers"

## 2022-04-24 ENCOUNTER — Telehealth: Payer: Self-pay

## 2022-04-24 LAB — CUP PACEART REMOTE DEVICE CHECK
Battery Remaining Longevity: 6 mo
Battery Voltage: 2.86 V
Brady Statistic AP VP Percent: 22.39 %
Brady Statistic AP VS Percent: 74.89 %
Brady Statistic AS VP Percent: 0.49 %
Brady Statistic AS VS Percent: 2.23 %
Brady Statistic RA Percent Paced: 96.33 %
Brady Statistic RV Percent Paced: 22.81 %
Date Time Interrogation Session: 20231211150647
Implantable Lead Connection Status: 753985
Implantable Lead Connection Status: 753985
Implantable Lead Implant Date: 20141202
Implantable Lead Implant Date: 20141202
Implantable Lead Location: 753859
Implantable Lead Location: 753860
Implantable Lead Model: 5076
Implantable Lead Model: 5076
Implantable Pulse Generator Implant Date: 20141202
Lead Channel Impedance Value: 342 Ohm
Lead Channel Impedance Value: 342 Ohm
Lead Channel Impedance Value: 380 Ohm
Lead Channel Impedance Value: 456 Ohm
Lead Channel Pacing Threshold Amplitude: 0.625 V
Lead Channel Pacing Threshold Amplitude: 1.625 V
Lead Channel Pacing Threshold Pulse Width: 0.4 ms
Lead Channel Pacing Threshold Pulse Width: 0.4 ms
Lead Channel Sensing Intrinsic Amplitude: 18.25 mV
Lead Channel Sensing Intrinsic Amplitude: 18.25 mV
Lead Channel Sensing Intrinsic Amplitude: 2.125 mV
Lead Channel Sensing Intrinsic Amplitude: 2.125 mV
Lead Channel Setting Pacing Amplitude: 2 V
Lead Channel Setting Pacing Amplitude: 3.25 V
Lead Channel Setting Pacing Pulse Width: 0.4 ms
Lead Channel Setting Sensing Sensitivity: 0.9 mV
Zone Setting Status: 755011
Zone Setting Status: 755011

## 2022-04-24 NOTE — Telephone Encounter (Signed)
Increased pacemaker remote checks to monthly. Patient called and made aware of dates below. Advised if he has further questions to please call. Patient voiced understanding.  05/25/2022 06/25/2022 07/26/2022 08/27/2022 09/27/2022 10/29/2022 11/29/2022 12/31/2022

## 2022-04-26 ENCOUNTER — Ambulatory Visit: Payer: Medicare HMO | Admitting: Rehabilitative and Restorative Service Providers"

## 2022-05-01 ENCOUNTER — Ambulatory Visit: Payer: Medicare HMO | Admitting: Rehabilitative and Restorative Service Providers"

## 2022-05-01 ENCOUNTER — Encounter: Payer: Self-pay | Admitting: Rehabilitative and Restorative Service Providers"

## 2022-05-01 DIAGNOSIS — R262 Difficulty in walking, not elsewhere classified: Secondary | ICD-10-CM

## 2022-05-01 DIAGNOSIS — M25561 Pain in right knee: Secondary | ICD-10-CM

## 2022-05-01 DIAGNOSIS — M6281 Muscle weakness (generalized): Secondary | ICD-10-CM | POA: Diagnosis not present

## 2022-05-01 DIAGNOSIS — R2689 Other abnormalities of gait and mobility: Secondary | ICD-10-CM
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
## 2022-05-03 ENCOUNTER — Ambulatory Visit: Payer: Medicare HMO | Admitting: Rehabilitative and Restorative Service Providers"

## 2022-05-03 ENCOUNTER — Encounter: Payer: Self-pay | Admitting: Rehabilitative and Restorative Service Providers"

## 2022-05-03 DIAGNOSIS — M25561 Pain in right knee: Secondary | ICD-10-CM | POA: Diagnosis not present

## 2022-05-03 DIAGNOSIS — M6281 Muscle weakness (generalized): Secondary | ICD-10-CM | POA: Diagnosis not present

## 2022-05-03 DIAGNOSIS — R262 Difficulty in walking, not elsewhere classified: Secondary | ICD-10-CM

## 2022-05-03 DIAGNOSIS — R2689 Other abnormalities of gait and mobility: Secondary | ICD-10-CM | POA: Diagnosis not present
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
## 2022-05-08 ENCOUNTER — Ambulatory Visit: Payer: Medicare HMO | Admitting: Rehabilitative and Restorative Service Providers"

## 2022-05-08 ENCOUNTER — Telehealth: Payer: Self-pay | Admitting: Rehabilitative and Restorative Service Providers"

## 2022-05-08 NOTE — Telephone Encounter (Signed)
Called pt about missed appointment on 05/08/2022 and left a message on answering machine to check on patient and remind of next scheduled appointment on 05/10/22.  Asked that pt call back if any appointments need to be rescheduled.

## 2022-05-10 ENCOUNTER — Ambulatory Visit: Payer: Medicare HMO

## 2022-05-10 DIAGNOSIS — M6281 Muscle weakness (generalized): Secondary | ICD-10-CM

## 2022-05-10 DIAGNOSIS — R262 Difficulty in walking, not elsewhere classified: Secondary | ICD-10-CM

## 2022-05-10 DIAGNOSIS — M25561 Pain in right knee: Secondary | ICD-10-CM | POA: Diagnosis not present

## 2022-05-10 DIAGNOSIS — R2689 Other abnormalities of gait and mobility: Secondary | ICD-10-CM | POA: Diagnosis not present
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
## 2022-05-11 NOTE — Progress Notes (Signed)
Remote pacemaker transmission.   

## 2022-05-16 ENCOUNTER — Ambulatory Visit (INDEPENDENT_AMBULATORY_CARE_PROVIDER_SITE_OTHER): Payer: Medicare HMO | Admitting: Family Medicine

## 2022-05-16 ENCOUNTER — Encounter: Payer: Self-pay | Admitting: Family Medicine

## 2022-05-16 VITALS — BP 140/84 | HR 79 | Temp 97.4°F | Ht 69.0 in | Wt 213.4 lb

## 2022-05-16 DIAGNOSIS — F17200 Nicotine dependence, unspecified, uncomplicated: Secondary | ICD-10-CM

## 2022-05-16 DIAGNOSIS — M79604 Pain in right leg: Secondary | ICD-10-CM | POA: Diagnosis not present

## 2022-05-16 DIAGNOSIS — M79605 Pain in left leg: Secondary | ICD-10-CM

## 2022-05-16 DIAGNOSIS — F17210 Nicotine dependence, cigarettes, uncomplicated: Secondary | ICD-10-CM | POA: Diagnosis not present

## 2022-05-16 DIAGNOSIS — E7800 Pure hypercholesterolemia, unspecified: Secondary | ICD-10-CM | POA: Diagnosis not present

## 2022-05-16 MED ORDER — ROSUVASTATIN CALCIUM 20 MG PO TABS: 20.0000 mg | ORAL_TABLET | Freq: Every day | ORAL | 3 refills | Status: DC

## 2022-05-16 NOTE — Progress Notes (Unsigned)
Established Patient Office Visit  Subjective   Patient ID: [name], male    DOB: 1932/08/22  Age: 89 y.o. MRN: [id number]  Chief Complaint  Patient presents with   Medication Reaction    Patient complains of medication reaction to Lipitor, Patient reports leg weakness,    HPI  {History (Optional):23778} [name] has chronic problems including history of hypertension, atrial fibrillation, tachybradycardia syndrome, history of cerebrovascular disease, obstructive sleep apnea, ongoing nicotine use, chronic kidney disease, metastatic prostate cancer, hyperlipidemia.  Seen today with concerns of bilateral leg pain.  His pain is mostly centered around the knees.  He is fairly sedentary.  Did recently complete some rehab exercises for mobility.  Denies recent fall or injury.  He specifically has concerns about Lipitor and is concerned it may be causing some knee and muscle aches.  He still smokes about 5 cigarettes/day.  Denies any active claudication type symptoms.  Past Medical History:  Diagnosis Date   Disturbances of sulphur-bearing amino-acid metabolism    Malignant neoplasm of prostate ([place])    Obstructive sleep apnea (adult) (pediatric) 2004   surgery to coorrect   Osteoarthrosis, unspecified whether generalized or localized, unspecified site    Other and unspecified hyperlipidemia    Other specified cardiac dysrhythmias(427.89)    Pacemaker 04/14/2013   DUAL CHAMBER PACEMAKER   Paroxysmal atrial fibrillation ([place])    Unspecified cerebral artery occlusion with cerebral infarction    Unspecified disorder resulting from impaired renal function    Unspecified essential hypertension    Wears glasses    Wears partial dentures    upper partial   Past Surgical History:  Procedure Laterality Date   APPLICATION OF A-CELL OF EXTREMITY Right 08/12/2013   Procedure: APPLICATION OF A-CELL OF EXTREMITY;  Surgeon: Theodoro Kos, DO;  Location: [facility];   Service: Plastics;  Laterality: Right;   CHOLECYSTECTOMY  1987   COLONOSCOPY     ent surgery  2004   correct snoring   INGUINAL HERNIA REPAIR     left   INSERT / REPLACE / REMOVE PACEMAKER  04/14/2013   dual chamber   / Dr Sallyanne Kuster   MINOR IRRIGATION AND DEBRIDEMENT OF WOUND Right 03/04/2013   Procedure: MINOR IRRIGATION AND DEBRIDEMENT OF WOUND;  Surgeon: Theodoro Kos, DO;  Location: [facility];  Service: Plastics;  Laterality: Right;   ORCHIECTOMY  2002   PERMANENT PACEMAKER INSERTION N/A 04/14/2013   Procedure: PERMANENT PACEMAKER INSERTION;  Surgeon: [name], MD;  Location: [facility];  Service: Cardiovascular;  Laterality: N/A;   SKIN SPLIT GRAFT Right 08/12/2013   Procedure: SKIN GRAFT SPLIT THICKNESS WITH PLACEMENT OF VAC TO RIGHT LOWER LEG/PLACEMENT OF ACELL TO RIGHT UPPER THIGH AREA (HARVEST SITE);  Surgeon: Theodoro Kos, DO;  Location: [facility];  Service: Plastics;  Laterality: Right;    reports that he has been smoking cigarettes. He has a 15.00 pack-year smoking history. He has never used smokeless tobacco. He reports that he does not drink alcohol and does not use drugs. family history includes Cancer in his father and sister; Prostate cancer in his father. No Known Allergies  Review of Systems  Constitutional:  Negative for fever and malaise/fatigue.  Eyes:  Negative for blurred vision.  Respiratory:  Negative for cough and shortness of breath.   Cardiovascular:  Negative for chest pain.  Neurological:  Negative for dizziness, weakness and headaches.      Objective:     BP (!) 140/84 (  BP Location: Left Arm, Patient Position: Sitting, Cuff Size: Normal)   Pulse 79   Temp (!) 97.4 F (36.3 C) (Oral)   Ht '5\' 9"'$  (1.753 m)   Wt 213 lb 6.4 oz (96.8 kg)   SpO2 96%   BMI 31.51 kg/m  {Vitals History (Optional):23777}  Physical Exam Vitals reviewed.  Constitutional:      Appearance: He is well-developed.  HENT:     Right Ear:  External ear normal.     Left Ear: External ear normal.  Eyes:     Pupils: Pupils are equal, round, and reactive to light.  Neck:     Thyroid: No thyromegaly.  Cardiovascular:     Rate and Rhythm: Normal rate and regular rhythm.     Comments: Feet are slightly cool to touch.  Slightly delayed capillary refill.  Difficult to palpate dorsalis pedis and posterior tibial pulses.  No ulcers   Pulmonary:     Effort: Pulmonary effort is normal. No respiratory distress.     Breath sounds: Normal breath sounds. No wheezing or rales.  Musculoskeletal:     Cervical back: Neck supple.     Right lower leg: No edema.     Left lower leg: No edema.  Neurological:     Mental Status: He is alert and oriented to person, place, and time.      No results found for any visits on 05/16/22.  {Labs (Optional):23779}  The ASCVD Risk score (Arnett DK, et al., 2019) failed to calculate for the following reasons:   The 2019 ASCVD risk score is only valid for ages 48 to 35   The patient has a prior MI or stroke diagnosis    Assessment & Plan:   #1 bilateral leg pain.  He has bilateral knee pain and suspect this is degenerative in nature.  He is specifically concerned about statin related arthralgia and myalgia.  We did agree to switch from atorvastatin to rosuvastatin 20 mg daily to see if this makes any difference.  He does have very high risk for peripheral vascular disease with his smoking status, diabetes, hyperlipidemia, etc.  We did discuss possible arterial Doppler scanning but at this point he is not describing any active claudication symptoms  #2 hyperlipidemia.  Patient requesting change from Lipitor for fear of side effects.  Not clear his bilateral leg pains are related.  Will try switching from atorvastatin to rosuvastatin 20 mg daily and reassess 2 months.  Recheck lipid and hepatic panel then.  #3 ongoing nicotine use.  Strongly encouraged to stop smoking completely.  Current motivation is  low.  Carolann Littler, MD

## 2022-05-16 NOTE — Patient Instructions (Signed)
Stop the Atorvastatin and start Rosuvastatin.  Set up 2 month follow up.

## 2022-05-22 ENCOUNTER — Ambulatory Visit: Payer: Medicare HMO | Attending: Cardiovascular Disease | Admitting: Cardiovascular Disease

## 2022-05-22 ENCOUNTER — Encounter: Payer: Self-pay | Admitting: Cardiovascular Disease

## 2022-05-22 VITALS — BP 118/82 | HR 65 | Ht 69.0 in | Wt 211.8 lb

## 2022-05-22 DIAGNOSIS — I480 Paroxysmal atrial fibrillation: Secondary | ICD-10-CM

## 2022-05-22 DIAGNOSIS — I495 Sick sinus syndrome: Secondary | ICD-10-CM | POA: Diagnosis not present

## 2022-05-22 DIAGNOSIS — D6869 Other thrombophilia: Secondary | ICD-10-CM

## 2022-05-22 DIAGNOSIS — I10 Essential (primary) hypertension: Secondary | ICD-10-CM

## 2022-05-22 DIAGNOSIS — N184 Chronic kidney disease, stage 4 (severe): Secondary | ICD-10-CM | POA: Diagnosis not present

## 2022-05-22 DIAGNOSIS — C61 Malignant neoplasm of prostate: Secondary | ICD-10-CM

## 2022-05-22 DIAGNOSIS — G4733 Obstructive sleep apnea (adult) (pediatric): Secondary | ICD-10-CM

## 2022-05-22 DIAGNOSIS — Z950 Presence of cardiac pacemaker: Secondary | ICD-10-CM | POA: Diagnosis not present

## 2022-05-22 NOTE — Patient Instructions (Signed)
   Follow-Up: At [facility], you and your health needs are our priority.  As part of our continuing mission to provide you with exceptional heart care, we have created designated Provider Care Teams.  These Care Teams include your primary Cardiologist (physician) and Advanced Practice Providers (APPs -  Physician Assistants and Nurse Practitioners) who all work together to provide you with the care you need, when you need it.  We recommend signing up for the patient portal called "MyChart".  Sign up information is provided on this After Visit Summary.  MyChart is used to connect with patients for Virtual Visits (Telemedicine).  Patients are able to view lab/test results, encounter notes, upcoming appointments, etc.  Non-urgent messages can be sent to your provider as well.   To learn more about what you can do with MyChart, go to [email or web address].    Your next appointment:   6 month(s)  The format for your next appointment:   In Person  Provider:   [name], MD

## 2022-05-22 NOTE — Progress Notes (Signed)
Cardiology Office Note    Date:  05/22/2022   ID:  [name], DOB 04/03/1933, MRN [id number]  PCP:  [name], MD  Cardiologist:  Quay Burow, M.D.;  [name], MD   No chief complaint on file.   History of Present Illness:  [name] is a 89 y.o. male with sinus node dysfunction and paroxysmal atrial fibrillation, dual-chamber permanent pacemaker.  His device was implanted in 2014 (MRI conditional Medtronic Advisa dual-chamber pacemaker) for tachycardia-bradycardia syndrome. Additional medical problems include hyperlipidemia, hypertension, obstructive sleep apnea, renal insufficiency, metastatic prostate cancer followed by Dr. Jeffie Pollock.    He generally feels well other than some fatigue.  He does not have dyspnea at rest or with activity and has never had complaints of angina.  Does not have orthopnea, PND, edema, syncope, palpitations.  His pacemaker shows daily episodes of brief atrial fibrillation but he is never aware of them.  For the most part he has good ventricular rate control.  The burden of atrial fibrillation has increased compared to years past and was around 2% in the last 18 months.  For the most part rate control is good.  Activity level remains unchanged at about 2.5-3 hours a day.  His pacemaker function is normal, but the device is approaching RRT, anticipated to occur in the next 6 months.  Battery voltage is 2.86 V (RT equals 2.83 V).  He has a Comptroller implanted in 2014 and both the atrial and ventricular leads are Medtronic [id number] leads.  He has 97% atrial pacing but only 23% ventricular pacing.  He is not device dependent.  His device has recorded 3 very brief episodes of nonsustained VT, at least some of which probably represent atrial tachycardia with 1: 1 AV conduction.  All his lipid parameters are in target range and his hemoglobin A1c is good at 6.6%.  Creatinine is unchanged at about 1.9% his usual  baseline.  Past Medical History:  Diagnosis Date   Disturbances of sulphur-bearing amino-acid metabolism    Malignant neoplasm of prostate ([place])    Obstructive sleep apnea (adult) (pediatric) 2004   surgery to coorrect   Osteoarthrosis, unspecified whether generalized or localized, unspecified site    Other and unspecified hyperlipidemia    Other specified cardiac dysrhythmias(427.89)    Pacemaker 04/14/2013   DUAL CHAMBER PACEMAKER   Paroxysmal atrial fibrillation ([place])    Unspecified cerebral artery occlusion with cerebral infarction    Unspecified disorder resulting from impaired renal function    Unspecified essential hypertension    Wears glasses    Wears partial dentures    upper partial    Past Surgical History:  Procedure Laterality Date   APPLICATION OF A-CELL OF EXTREMITY Right 08/12/2013   Procedure: APPLICATION OF A-CELL OF EXTREMITY;  Surgeon: Theodoro Kos, DO;  Location: [place];  Service: Plastics;  Laterality: Right;   CHOLECYSTECTOMY  1987   COLONOSCOPY     ent surgery  2004   correct snoring   INGUINAL HERNIA REPAIR     left   INSERT / REPLACE / REMOVE PACEMAKER  04/14/2013   dual chamber   / Dr Sallyanne Kuster   MINOR IRRIGATION AND DEBRIDEMENT OF WOUND Right 03/04/2013   Procedure: MINOR IRRIGATION AND DEBRIDEMENT OF WOUND;  Surgeon: Theodoro Kos, DO;  Location: [place];  Service: Plastics;  Laterality: Right;   ORCHIECTOMY  2002   PERMANENT PACEMAKER INSERTION N/A 04/14/2013   Procedure: PERMANENT PACEMAKER INSERTION;  Surgeon: [name], MD;  Location[facility] LAB;  Service: Cardiovascular;  Laterality: N/A;   SKIN SPLIT GRAFT Right 08/12/2013   Procedure: SKIN GRAFT SPLIT THICKNESS WITH PLACEMENT OF VAC TO RIGHT LOWER LEG/PLACEMENT OF ACELL TO RIGHT UPPER THIGH AREA (HARVEST SITE);  Surgeon: Theodoro Kos, DO;  Location: [place];  Service: Plastics;  Laterality: Right;    Current  Medications: Outpatient Medications Prior to Visit  Medication Sig Dispense Refill   amLODipine (NORVASC) 5 MG tablet TAKE 1 TABLET (5 MG TOTAL) BY MOUTH DAILY. (NEED MD APPOINTMENT) 90 tablet 3   apixaban (ELIQUIS) 2.5 MG TABS tablet TAKE 1 TABLET(2.5 MG) BY MOUTH TWICE DAILY 180 tablet 1   Ascorbic Acid (VITAMIN C) 1000 MG tablet Take 1,000 mg by mouth daily.     aspirin EC 81 MG EC tablet Take 1 tablet (81 mg total) by mouth daily. 30 tablet 0   cholecalciferol (VITAMIN D3) 25 MCG (1000 UNIT) tablet Take 1,000 Units by mouth daily.     metoprolol tartrate (LOPRESSOR) 50 MG tablet Take 1 tablet (50 mg total) by mouth 2 (two) times daily. 180 tablet 0   Multiple Vitamin (MULTIVITAMIN WITH MINERALS) TABS tablet Take 1 tablet by mouth daily.     acetaminophen (TYLENOL) 325 MG tablet Take 2 tablets (650 mg total) by mouth every 6 (six) hours as needed. (Patient not taking: Reported on 05/22/2022) 36 tablet 0   albuterol (VENTOLIN HFA) 108 (90 Base) MCG/ACT inhaler Inhale 2 puffs into the lungs every 6 (six) hours as needed for wheezing or shortness of breath. (Patient not taking: Reported on 05/22/2022) 18 g 3   rosuvastatin (CRESTOR) 20 MG tablet Take 1 tablet (20 mg total) by mouth daily. (Patient not taking: Reported on 05/22/2022) 90 tablet 3   XTANDI 40 MG capsule Take 80 mg by mouth daily.  (Patient not taking: Reported on 05/22/2022)     No facility-administered medications prior to visit.     Allergies:   Patient has no known allergies.   Social History   Socioeconomic History   Marital status: Married    Spouse name: Consulting civil engineer   Number of children: Not on file   Years of education: Not on file   Highest education level: Not on file  Occupational History   Occupation: Retired   Occupation: RETIRED    Employer: RETIRED  Tobacco Use   Smoking status: Some Days    Packs/day: 0.50    Years: 30.00    Total pack years: 15.00    Types: Cigarettes   Smokeless tobacco: Never   Tobacco  comments:     pt stated he smokes 1 pack every 2 wks  Vaping Use   Vaping Use: Never used  Substance and Sexual Activity   Alcohol use: No   Drug use: No   Sexual activity: Not on file    Comment: started smoking 2-3 cig daily  Other Topics Concern   Not on file  Social History Narrative   Not on file   Social Determinants of Health   Financial Resource Strain: Low Risk  (09/12/2021)   Overall Financial Resource Strain (CARDIA)    Difficulty of Paying Living Expenses: Not hard at all  Food Insecurity: No Food Insecurity (09/12/2021)   Hunger Vital Sign    Worried About Running Out of Food in the Last Year: Never true    Ran Out of Food in the Last Year: Never true  Transportation Needs: No Transportation Needs (  09/12/2021)   PRAPARE - Hydrologist (Medical): No    Lack of Transportation (Non-Medical): No  Physical Activity: Inactive (09/12/2021)   Exercise Vital Sign    Days of Exercise per Week: 0 days    Minutes of Exercise per Session: 0 min  Stress: No Stress Concern Present (09/12/2021)   [place]    Feeling of Stress : Not at all  Social Connections: Moderately Isolated (09/06/2020)   Social Connection and Isolation Panel [NHANES]    Frequency of Communication with Friends and Family: Twice a week    Frequency of Social Gatherings with Friends and Family: Never    Attends Religious Services: More than 4 times per year    Active Member of [employer] or Organizations: No    Attends Archivist Meetings: Never    Marital Status: Married     Family History:  The patient's family history includes Cancer in his father and sister; Prostate cancer in his father.   ROS:   Please see the history of present illness.    ROS All other systems are reviewed and are negative.   PHYSICAL EXAM:   VS:  BP 118/82   Pulse 65   Ht '5\' 9"'$  (1.753 m)   Wt 211 lb 12.8 oz (96.1 kg)   SpO2 93%    BMI 31.28 kg/m    Vital signs reviewed.  Unable to examine.  Wt Readings from Last 3 Encounters:  05/22/22 211 lb 12.8 oz (96.1 kg)  05/16/22 213 lb 6.4 oz (96.8 kg)  02/14/22 207 lb 1.6 oz (93.9 kg)     General: Alert, oriented x3, no distress, healthy left subclavian pacemaker site Head: no evidence of trauma, PERRL, EOMI, no exophtalmos or lid lag, no myxedema, no xanthelasma; normal ears, nose and oropharynx Neck: normal jugular venous pulsations and no hepatojugular reflux; brisk carotid pulses without delay and no carotid bruits Chest: clear to auscultation, no signs of consolidation by percussion or palpation, normal fremitus, symmetrical and full respiratory excursions Cardiovascular: normal position and quality of the apical impulse, regular rhythm, normal first and second heart sounds, no murmurs, rubs or gallops Abdomen: no tenderness or distention, no masses by palpation, no abnormal pulsatility or arterial bruits, normal bowel sounds, no hepatosplenomegaly Extremities: no clubbing, cyanosis or edema; 2+ radial, ulnar and brachial pulses bilaterally; 2+ right femoral, posterior tibial and dorsalis pedis pulses; 2+ left femoral, posterior tibial and dorsalis pedis pulses; no subclavian or femoral bruits Neurological: grossly nonfocal Psych: Normal mood and affect    Studies/Labs Reviewed:   EKG: Not ordered today, personally reviewed the tracing from 02/07/2022 which shows atrial fibrillation with borderline ventricular rate control and diffuse nonspecific T wave changes.  A single V paced beat is seen.    Recent Labs: 02/06/2022: ALT 12; BUN 24; Creatinine, Ser 2.13; Hemoglobin 13.0; Platelets 187.0; Potassium 4.2; Sodium 144   Lipid Panel    Component Value Date/Time   CHOL 149 02/06/2022 0932   CHOL 181 05/12/2019 0945   TRIG 149.0 02/06/2022 0932   TRIG 77 04/11/2006 0906   HDL 48.00 02/06/2022 0932   HDL 42 05/12/2019 0945   CHOLHDL 3 02/06/2022 0932   VLDL 29.8  02/06/2022 0932   LDLCALC 71 02/06/2022 0932   LDLCALC 103 (H) 05/12/2019 0945   LDLDIRECT 112.0 05/17/2017 0855     ASSESSMENT:    1. PAF (paroxysmal atrial fibrillation) ([place])   2. Acquired  thrombophilia ([place])   3. Essential hypertension   4. SSS (sick sinus syndrome) ([place])   5. Pacemaker - Medtronic, implanted 04/14/13   6. OSA (obstructive sleep apnea)   7. METASTATIC PROSTATE CANCER   8. CKD (chronic kidney disease) stage 4, GFR 15-29 ml/min (HCC)      PLAN:  In order of problems listed above:  AFib: Prevalence of atrial fibrillation has increased but remains relatively low at about 2% and rate control is adequate if not perfect.  On Eliquis, but has difficulty being compliant with the medicine due to cost.  Will try again to apply for patient assistance.  She does have a history of embolic stroke.  He has not had bleeding issues.   He does have a history of stroke.  (CHADSVasc 5: age 18, stroke 2, hypertension).   Anticoagulation: Dose adjusted for age and renal dysfunction. HTN: Well-controlled SSS: Nearly 100% atrial pacing when he is not in atrial fibrillation.  Underlying rhythm is severe sinus bradycardia around 40 bpm.  Heart rate histogram distribution appears appropriate. PPM: Normal device function.  Approaching RRT anticipated in 6 months.  Will start monthly downloads for battery status.  We discussed the pacemaker generator change procedure in some detail, including possible complications.  Schedule follow-up appointment 6 months. OSA: Recommended 100%compliance. Metastatic prostate cancer: Does not have pain.  Good quality of life. CKD 3B-4: Creatinine seems to be stable around 1.9-2.0.  GFR 25-30  Medication Adjustments/Labs and Tests Ordered: Current medicines are reviewed at length with the patient today.  Concerns regarding medicines are outlined above.  Medication changes, Labs and Tests ordered today are listed in the Patient Instructions below. Patient  Instructions    Follow-Up: At [facility], you and your health needs are our priority.  As part of our continuing mission to provide you with exceptional heart care, we have created designated Provider Care Teams.  These Care Teams include your primary Cardiologist (physician) and Advanced Practice Providers (APPs -  Physician Assistants and Nurse Practitioners) who all work together to provide you with the care you need, when you need it.  We recommend signing up for the patient portal called "MyChart".  Sign up information is provided on this After Visit Summary.  MyChart is used to connect with patients for Virtual Visits (Telemedicine).  Patients are able to view lab/test results, encounter notes, upcoming appointments, etc.  Non-urgent messages can be sent to your provider as well.   To learn more about what you can do with MyChart, go to [email or web address].    Your next appointment:   6 month(s)  The format for your next appointment:   In Person  Provider:   [name], MD               Signed, [name], MD  05/22/2022 10:32 AM    [facility] [place], [place], [place]  [address] Phone: [phone]; Fax: [phone]

## 2022-07-18 ENCOUNTER — Encounter: Payer: Self-pay | Admitting: Family Medicine

## 2022-07-18 ENCOUNTER — Ambulatory Visit (INDEPENDENT_AMBULATORY_CARE_PROVIDER_SITE_OTHER): Payer: Medicare HMO | Admitting: Family Medicine

## 2022-07-18 ENCOUNTER — Ambulatory Visit: Payer: Medicare HMO | Admitting: Family Medicine

## 2022-07-18 VITALS — BP 130/90 | HR 64 | Temp 97.9°F | Ht 69.0 in | Wt 211.7 lb

## 2022-07-18 DIAGNOSIS — J069 Acute upper respiratory infection, unspecified: Secondary | ICD-10-CM

## 2022-07-18 NOTE — Progress Notes (Signed)
Established Patient Office Visit  Subjective   Patient ID: [name], male    DOB: 1932-05-25  Age: 89 y.o. MRN: [id number]  Chief Complaint  Patient presents with   Hospitalization Follow-up   Cough    Patient complains of cough, x1.5 weeks, Non productive    HPI   [name] is here with some cough and congestion for about a week and a half.  His wife reportedly had positive influenza screen over a couple weeks ago.  He states around the last week of February he developed some diarrhea for about 3 to 4 days but resolved since then.  No nausea or vomiting.  No fever.  He had some sneezing followed by cough with clear mucus.  No hemoptysis.  No chills.  No dyspnea.  Longstanding history of smoking.  Not aware of any wheezing.  He feels like he is almost at baseline at this time.  Past Medical History:  Diagnosis Date   Disturbances of sulphur-bearing amino-acid metabolism    Malignant neoplasm of prostate ([place])    Obstructive sleep apnea (adult) (pediatric) 2004   surgery to coorrect   Osteoarthrosis, unspecified whether generalized or localized, unspecified site    Other and unspecified hyperlipidemia    Other specified cardiac dysrhythmias(427.89)    Pacemaker 04/14/2013   DUAL CHAMBER PACEMAKER   Paroxysmal atrial fibrillation ([place])    Unspecified cerebral artery occlusion with cerebral infarction    Unspecified disorder resulting from impaired renal function    Unspecified essential hypertension    Wears glasses    Wears partial dentures    upper partial   Past Surgical History:  Procedure Laterality Date   APPLICATION OF A-CELL OF EXTREMITY Right 08/12/2013   Procedure: APPLICATION OF A-CELL OF EXTREMITY;  Surgeon: Theodoro Kos, DO;  Location: [place];  Service: Plastics;  Laterality: Right;   CHOLECYSTECTOMY  1987   COLONOSCOPY     ent surgery  2004   correct snoring   INGUINAL HERNIA REPAIR     left   INSERT / REPLACE / REMOVE  PACEMAKER  04/14/2013   dual chamber   / Dr Sallyanne Kuster   MINOR IRRIGATION AND DEBRIDEMENT OF WOUND Right 03/04/2013   Procedure: MINOR IRRIGATION AND DEBRIDEMENT OF WOUND;  Surgeon: Theodoro Kos, DO;  Location: [place];  Service: Plastics;  Laterality: Right;   ORCHIECTOMY  2002   PERMANENT PACEMAKER INSERTION N/A 04/14/2013   Procedure: PERMANENT PACEMAKER INSERTION;  Surgeon: [name], MD;  Location: [place] CATH LAB;  Service: Cardiovascular;  Laterality: N/A;   SKIN SPLIT GRAFT Right 08/12/2013   Procedure: SKIN GRAFT SPLIT THICKNESS WITH PLACEMENT OF VAC TO RIGHT LOWER LEG/PLACEMENT OF ACELL TO RIGHT UPPER THIGH AREA (HARVEST SITE);  Surgeon: Theodoro Kos, DO;  Location: [place];  Service: Plastics;  Laterality: Right;    reports that he has been smoking cigarettes. He has a 15.00 pack-year smoking history. He has never used smokeless tobacco. He reports that he does not drink alcohol and does not use drugs. family history includes Cancer in his father and sister; Prostate cancer in his father. No Known Allergies  Review of Systems  Constitutional:  Negative for chills and fever.  Respiratory:  Positive for cough. Negative for hemoptysis and wheezing.   Cardiovascular:  Negative for chest pain.      Objective:     BP (!) 130/90 (BP Location: Left Arm, Patient Position: Sitting, Cuff Size: Normal)   Pulse 64  Temp 97.9 F (36.6 C) (Oral)   Ht '5\' 9"'$  (1.753 m)   Wt 211 lb 11.2 oz (96 kg)   SpO2 95%   BMI 31.26 kg/m  BP Readings from Last 3 Encounters:  07/18/22 (!) 130/90  05/22/22 118/82  05/16/22 (!) 140/84   Wt Readings from Last 3 Encounters:  07/18/22 211 lb 11.2 oz (96 kg)  05/22/22 211 lb 12.8 oz (96.1 kg)  05/16/22 213 lb 6.4 oz (96.8 kg)      Physical Exam Vitals reviewed.  Constitutional:      General: He is not in acute distress.    Appearance: He is not ill-appearing.  HENT:     Right Ear: Tympanic membrane normal.      Left Ear: Tympanic membrane normal.  Cardiovascular:     Rate and Rhythm: Normal rate and regular rhythm.  Pulmonary:     Comments: Diminished breath sounds throughout but no active wheezing Neurological:     Mental Status: He is alert.      No results found for any visits on 07/18/22.    The ASCVD Risk score (Arnett DK, et al., 2019) failed to calculate for the following reasons:   The 2019 ASCVD risk score is only valid for ages 3 to 86   The patient has a prior MI or stroke diagnosis    Assessment & Plan:   Recent upper respiratory infection with cough.  Sounded more likely viral.  His wife had influenza but Kalyb denied any fever and seems to be recovering and near baseline at this time.  Does not have any red flags such as fever or dyspnea.  -Observe for now. -He still smokes about a pack cigarettes per day and strongly advised quitting.  His motivation is low. -Follow-up immediately for any fever, increased shortness of breath, or other concerns  Carolann Littler, MD

## 2022-07-30 ENCOUNTER — Telehealth: Payer: Self-pay | Admitting: Cardiovascular Disease

## 2022-07-30 MED ORDER — APIXABAN 2.5 MG PO TABS: ORAL_TABLET | ORAL | 0 refills | Status: DC

## 2022-07-30 NOTE — Telephone Encounter (Signed)
Patient missed transmission according to [employer]art on 07/29/21. Could someone f/u with patient.

## 2022-07-30 NOTE — Telephone Encounter (Signed)
Patient calling the office for samples of medication:   1.  What medication and dosage are you requesting samples for?   apixaban (ELIQUIS) 2.5 MG TABS tablet    2.  Are you currently out of this medication? Yes pt has been off for a day

## 2022-07-30 NOTE — Telephone Encounter (Signed)
Samples placed at front desk for pickup.  Wife states they have not heard back from [facility] provided for patient to call company for update.      Wife also wondering if patient is overdue for device check/transmission.    Advised would send to device clinic to review.

## 2022-07-30 NOTE — Telephone Encounter (Signed)
Okay to give samples of 2.5 mg Eliquis

## 2022-07-31 NOTE — Telephone Encounter (Signed)
I spoke with the patient wife and she is going to make sure he send a manual transmission today.

## 2022-08-15 ENCOUNTER — Ambulatory Visit: Payer: Medicare HMO | Admitting: Family Medicine

## 2022-08-17 ENCOUNTER — Ambulatory Visit (INDEPENDENT_AMBULATORY_CARE_PROVIDER_SITE_OTHER): Payer: Medicare HMO

## 2022-08-17 ENCOUNTER — Telehealth: Payer: Self-pay

## 2022-08-17 DIAGNOSIS — I495 Sick sinus syndrome: Secondary | ICD-10-CM | POA: Diagnosis not present

## 2022-08-17 NOTE — Telephone Encounter (Signed)
Transmission received.  3 months left on battery to [facility].

## 2022-08-17 NOTE — Telephone Encounter (Signed)
Outreach made to Pt d/t missed transmission.  He will send one.  Advised would call him once received.

## 2022-08-18 LAB — CUP PACEART REMOTE DEVICE CHECK
Battery Remaining Longevity: 3 mo
Battery Voltage: 2.84 V
Brady Statistic AP VP Percent: 19.41 %
Brady Statistic AP VS Percent: 75.31 %
Brady Statistic AS VP Percent: 1.24 %
Brady Statistic AS VS Percent: 4.04 %
Brady Statistic RA Percent Paced: 92.09 %
Brady Statistic RV Percent Paced: 20.45 %
Date Time Interrogation Session: 20240405124442
Implantable Lead Connection Status: 753985
Implantable Lead Connection Status: 753985
Implantable Lead Implant Date: 20141202
Implantable Lead Implant Date: 20141202
Implantable Lead Location: 753859
Implantable Lead Location: 753860
Implantable Lead Model: 5076
Implantable Lead Model: 5076
Implantable Pulse Generator Implant Date: 20141202
Lead Channel Impedance Value: 380 Ohm
Lead Channel Impedance Value: 380 Ohm
Lead Channel Impedance Value: 399 Ohm
Lead Channel Impedance Value: 475 Ohm
Lead Channel Pacing Threshold Amplitude: 0.5 V
Lead Channel Pacing Threshold Amplitude: 1.25 V
Lead Channel Pacing Threshold Pulse Width: 0.4 ms
Lead Channel Pacing Threshold Pulse Width: 0.4 ms
Lead Channel Sensing Intrinsic Amplitude: 1.75 mV
Lead Channel Sensing Intrinsic Amplitude: 1.75 mV
Lead Channel Sensing Intrinsic Amplitude: 16.25 mV
Lead Channel Sensing Intrinsic Amplitude: 16.25 mV
Lead Channel Setting Pacing Amplitude: 2 V
Lead Channel Setting Pacing Amplitude: 3 V
Lead Channel Setting Pacing Pulse Width: 0.4 ms
Lead Channel Setting Sensing Sensitivity: 0.9 mV
Zone Setting Status: 755011
Zone Setting Status: 755011

## 2022-09-03 DIAGNOSIS — C61 Malignant neoplasm of prostate: Secondary | ICD-10-CM | POA: Diagnosis not present

## 2022-09-05 ENCOUNTER — Other Ambulatory Visit: Payer: Self-pay | Admitting: Cardiovascular Disease

## 2022-09-10 ENCOUNTER — Telehealth: Payer: Self-pay | Admitting: Pharmacist Clinician (PhC)/ Clinical Pharmacy Specialist

## 2022-09-10 DIAGNOSIS — C7951 Secondary malignant neoplasm of bone: Secondary | ICD-10-CM | POA: Diagnosis not present

## 2022-09-10 DIAGNOSIS — C775 Secondary and unspecified malignant neoplasm of intrapelvic lymph nodes: Secondary | ICD-10-CM | POA: Diagnosis not present

## 2022-09-10 DIAGNOSIS — C61 Malignant neoplasm of prostate: Secondary | ICD-10-CM | POA: Diagnosis not present

## 2022-09-10 DIAGNOSIS — R3121 Asymptomatic microscopic hematuria: Secondary | ICD-10-CM | POA: Diagnosis not present

## 2022-09-10 MED ORDER — APIXABAN 2.5 MG PO TABS: 2.5000 mg | ORAL_TABLET | Freq: Two times a day (BID) | ORAL | 0 refills | Status: AC

## 2022-09-10 NOTE — Telephone Encounter (Signed)
Patient came into the office looking for Eliquis samples.  Was informed that he needed to call ahead for this.    Dose verified - Eliquis 2.5 mg  2 weeks samples provided

## 2022-09-11 ENCOUNTER — Telehealth: Payer: Self-pay | Admitting: Family Medicine

## 2022-09-11 NOTE — Telephone Encounter (Signed)
Contacted [name] to schedule their annual wellness visit. Appointment made for 09/19/22.  [name] AWV direct phone # [phone]   Due to schedule AWV 09/18/22 r/s to 09/19/22 pt aware

## 2022-09-17 ENCOUNTER — Ambulatory Visit (INDEPENDENT_AMBULATORY_CARE_PROVIDER_SITE_OTHER): Payer: Medicare HMO

## 2022-09-17 DIAGNOSIS — I495 Sick sinus syndrome: Secondary | ICD-10-CM | POA: Diagnosis not present

## 2022-09-18 ENCOUNTER — Ambulatory Visit: Payer: Medicare HMO

## 2022-09-19 ENCOUNTER — Ambulatory Visit (INDEPENDENT_AMBULATORY_CARE_PROVIDER_SITE_OTHER): Payer: Medicare HMO

## 2022-09-19 VITALS — BP 120/76 | HR 74 | Temp 97.6°F | Ht 69.0 in | Wt 209.7 lb

## 2022-09-19 DIAGNOSIS — Z0000 Encounter for general adult medical examination without abnormal findings: Secondary | ICD-10-CM

## 2022-09-19 NOTE — Progress Notes (Signed)
Subjective:   [name] is a 89 y.o. male who presents for Medicare Annual/Subsequent preventive examination.  Review of Systems     Cardiac Risk Factors include: advanced age (>60men, >15 women);hypertension;smoking/ tobacco exposure;male gender     Objective:    Today's Vitals   09/19/22 1232  BP: 120/76  Pulse: 74  Temp: 97.6 F (36.4 C)  TempSrc: Oral  SpO2: 99%  Weight: 209 lb 11.2 oz (95.1 kg)  Height: 5\' 9"  (1.753 m)   Body mass index is 30.97 kg/m.     09/19/2022   12:48 PM 03/14/2022   11:20 AM 09/12/2021   11:15 AM 04/13/2021    7:02 PM 09/06/2020   11:08 AM 03/19/2019    5:40 PM 03/18/2019    1:19 PM  Advanced Directives  Does Patient Have a Medical Advance Directive? Yes Yes Yes No Yes  No  Type of Estate agent of [place];Living will Healthcare Power of [place];Living will Healthcare Power of [place];Living will  Living will    Does patient want to make changes to medical advance directive?  No - Patient declined       Copy of Healthcare Power of Attorney in Chart? No - copy requested No - copy requested No - copy requested      Would patient like information on creating a medical advance directive?    No - Patient declined  No - Patient declined No - Patient declined    Current Medications (verified) Outpatient Encounter Medications as of 09/19/2022  Medication Sig   acetaminophen (TYLENOL) 325 MG tablet Take 2 tablets (650 mg total) by mouth every 6 (six) hours as needed.   albuterol (VENTOLIN HFA) 108 (90 Base) MCG/ACT inhaler Inhale 2 puffs into the lungs every 6 (six) hours as needed for wheezing or shortness of breath.   amLODipine (NORVASC) 5 MG tablet TAKE 1 TABLET (5 MG TOTAL) BY MOUTH DAILY. (NEED MD APPOINTMENT)   apixaban (ELIQUIS) 2.5 MG TABS tablet TAKE 1 TABLET(2.5 MG) BY MOUTH TWICE DAILY   apixaban (ELIQUIS) 2.5 MG TABS tablet Take 1 tablet (2.5 mg total) by mouth 2 (two) times daily.   Ascorbic Acid (VITAMIN C)  1000 MG tablet Take 1,000 mg by mouth daily.   aspirin EC 81 MG EC tablet Take 1 tablet (81 mg total) by mouth daily.   cholecalciferol (VITAMIN D3) 25 MCG (1000 UNIT) tablet Take 1,000 Units by mouth daily.   metoprolol tartrate (LOPRESSOR) 50 MG tablet TAKE 1 TABLET TWICE DAILY   Multiple Vitamin (MULTIVITAMIN WITH MINERALS) TABS tablet Take 1 tablet by mouth daily.   rosuvastatin (CRESTOR) 20 MG tablet Take 1 tablet (20 mg total) by mouth daily.   XTANDI 40 MG capsule Take 80 mg by mouth daily.   No facility-administered encounter medications on file as of 09/19/2022.    Allergies (verified) Patient has no known allergies.   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/14/2013   DUAL CHAMBER PACEMAKER   Paroxysmal atrial fibrillation (HCC)    Unspecified cerebral artery occlusion with cerebral infarction    Unspecified disorder resulting from impaired renal function    Unspecified essential hypertension  Wears glasses    Wears partial dentures    upper partial   Past Surgical History:  Procedure Laterality Date   APPLICATION OF A-CELL OF EXTREMITY Right 08/12/2013   Procedure: APPLICATION OF A-CELL OF EXTREMITY;  Surgeon: Wayland Denis, DO;  Location: [facility];  Service: Plastics;  Laterality: Right;   CHOLECYSTECTOMY  1987   COLONOSCOPY     ent surgery  2004   correct snoring   INGUINAL HERNIA REPAIR     left   INSERT / REPLACE / REMOVE PACEMAKER  04/14/2013   dual chamber   / Dr Royann Shivers   MINOR IRRIGATION AND DEBRIDEMENT OF WOUND Right 03/04/2013   Procedure: MINOR IRRIGATION AND DEBRIDEMENT OF  WOUND;  Surgeon: Wayland Denis, DO;  Location: [facility];  Service: Plastics;  Laterality: Right;   ORCHIECTOMY  2002   PERMANENT PACEMAKER INSERTION N/A 04/14/2013   Procedure: PERMANENT PACEMAKER INSERTION;  Surgeon: Thurmon Fair, MD;  Location: [facility];  Service: Cardiovascular;  Laterality: N/A;   SKIN SPLIT GRAFT Right 08/12/2013   Procedure: SKIN GRAFT SPLIT THICKNESS WITH PLACEMENT OF VAC TO RIGHT LOWER LEG/PLACEMENT OF ACELL TO RIGHT UPPER THIGH AREA (HARVEST SITE);  Surgeon: Wayland Denis, DO;  Location: [facility];  Service: Plastics;  Laterality: Right;   Family History  Problem Relation Age of Onset   Prostate cancer Father    Cancer Father        prostate cancer   Cancer Sister        ovarian cancer   Social History   Socioeconomic History   Marital status: Married    Spouse name: Museum/gallery exhibitions officer   Number of children: Not on file   Years of education: Not on file   Highest education level: Not on file  Occupational History   Occupation: Retired   Occupation: RETIRED    Employer: RETIRED  Tobacco Use   Smoking status: Some Days    Packs/day: 0.50    Years: 30.00    Additional pack years: 0.00    Total pack years: 15.00    Types: Cigarettes   Smokeless tobacco: Never   Tobacco comments:     pt stated he smokes 1 pack every 2 wks  Vaping Use   Vaping Use: Never used  Substance and Sexual Activity   Alcohol use: No   Drug use: No   Sexual activity: Not on file    Comment: started smoking 2-3 cig daily  Other Topics Concern   Not on file  Social History Narrative   Not on file   Social Determinants of Health   Financial Resource Strain: Low Risk  (09/19/2022)   Overall Financial Resource Strain (CARDIA)    Difficulty of Paying Living Expenses: Not hard at all  Food Insecurity: No Food Insecurity (09/19/2022)   Hunger Vital Sign    Worried About Running Out of Food in the Last Year: Never true    Ran Out of Food in the Last  Year: Never true  Transportation Needs: No Transportation Needs (09/19/2022)   PRAPARE - Administrator, Civil Service (Medical): No    Lack of Transportation (Non-Medical): No  Physical Activity: Insufficiently Active (09/19/2022)   Exercise Vital Sign    Days of Exercise per Week: 2 days    Minutes of Exercise per Session: 20 min  Stress: No Stress Concern Present (09/19/2022)   [employer] of Occupational Health - Occupational Stress Questionnaire    Feeling  of Stress : Not at all  Social Connections: Socially Integrated (09/19/2022)   Social Connection and Isolation Panel [NHANES]    Frequency of Communication with Friends and Family: More than three times a week    Frequency of Social Gatherings with Friends and Family: More than three times a week    Attends Religious Services: More than 4 times per year    Active Member of [employer] or Organizations: Yes    Attends Engineer, structural: More than 4 times per year    Marital Status: Married    Tobacco Counseling Ready to quit: Yes Counseling given: Yes Tobacco comments:  pt stated he smokes 1 pack every 2 wks   Clinical Intake:  Pre-visit preparation completed: No  Pain : No/denies pain     BMI - recorded: 30.97 Nutritional Status: BMI > 30  Obese Nutritional Risks: None Diabetes: No  How often do you need to have someone help you when you read instructions, pamphlets, or other written materials from your doctor or pharmacy?: 1 - Never  Diabetic?  No  Interpreter Needed?: No  Information entered by :: Theresa Mulligan LPN   Activities of Daily Living    09/19/2022   12:46 PM  In your present state of health, do you have any difficulty performing the following activities:  Hearing? 0  Vision? 0  Difficulty concentrating or making decisions? 0  Walking or climbing stairs? 0  Dressing or bathing? 0  Doing errands, shopping? 0  Preparing Food and eating ? N  Using the Toilet? N  In the past  six months, have you accidently leaked urine? N  Do you have problems with loss of bowel control? N  Managing your Medications? N  Managing your Finances? N  Housekeeping or managing your Housekeeping? N    Patient Care Team: Kristian Covey, MD as PCP - General (Family Medicine) Croitoru, Rachelle Hora, MD as PCP - Cardiology (Cardiology) Verner Chol, [facility] (Inactive) as Pharmacist (Pharmacist)  Indicate any recent Medical Services you may have received from other than [facility] providers in the past year (date may be approximate).     Assessment:   This is a routine wellness examination for [name].  Hearing/Vision screen Hearing Screening - Comments:: Denies hearing difficulties   Vision Screening - Comments:: Wears rx glasses - up to date with routine eye exams with  [facility]  Dietary issues and exercise activities discussed: Exercise limited by: None identified   Goals Addressed               This Visit's Progress     Quit Smoking (pt-stated)         Depression Screen    09/19/2022   12:46 PM 02/14/2022    3:27 PM 02/06/2022    9:08 AM 09/12/2021   11:17 AM 09/06/2020   11:07 AM 05/12/2019    9:49 AM 07/31/2017    2:34 PM  PHQ 2/9 Scores  PHQ - 2 Score 0 1 0 0 0 0 0  PHQ- 9 Score  2 1 1        Fall Risk    09/19/2022   12:47 PM 02/14/2022    3:27 PM 02/06/2022    9:09 AM 09/12/2021   11:16 AM 09/06/2020   11:10 AM  Fall Risk   Falls in the past year? 0 0 0 0 0  Number falls in past yr: 0 0 0 0 0  Injury with Fall? 0 0 0 0 0  Risk for fall due to : No Fall Risks No Fall Risks No Fall Risks Medication side effect Impaired vision  Follow up Falls prevention discussed Falls evaluation completed Falls evaluation completed Falls evaluation completed;Education provided;Falls prevention discussed Falls prevention discussed    FALL RISK PREVENTION PERTAINING TO THE HOME:  Any stairs in or around the home? Yes  If so, are there any without handrails? No  Home free  of loose throw rugs in walkways, pet beds, electrical cords, etc? Yes  Adequate lighting in your home to reduce risk of falls? Yes   ASSISTIVE DEVICES UTILIZED TO PREVENT FALLS:  Life alert? Yes  Use of a cane, walker or w/c? No  Grab bars in the bathroom? No  Shower chair or bench in shower? No  Elevated toilet seat or a handicapped toilet? Yes   TIMED UP AND GO:  Was the test performed? Yes .  Length of time to ambulate 10 feet: 10 sec.   Gait slow and steady without use of assistive device  Cognitive Function:        09/19/2022   12:48 PM 09/12/2021   11:18 AM 09/06/2020   11:12 AM  6CIT Screen  What Year? 0 points 0 points 0 points  What month? 0 points 0 points 0 points  What time? 0 points 0 points   Count back from 20 0 points 0 points 0 points  Months in reverse 0 points 0 points 0 points  Repeat phrase 0 points 6 points 10 points  Total Score 0 points 6 points     Immunizations Immunization History  Administered Date(s) Administered   Fluad Quad(high Dose 65+) 02/24/2019, 02/06/2022   Influenza Split 03/22/2011, 04/01/2012   Influenza Whole 02/18/2009, 04/13/2010   Influenza, High Dose Seasonal PF 05/10/2014, 05/17/2015, 03/28/2016, 03/20/2017, 02/17/2018   Influenza,inj,Quad PF,6+ Mos 02/16/2013   PFIZER(Purple Top)SARS-COV-2 Vaccination 06/27/2019, 07/22/2019, 03/13/2020, 10/02/2020   Pneumococcal Conjugate-13 11/11/2013   Pneumococcal Polysaccharide-23 05/18/2016   Tdap 11/22/2011, 04/13/2021    TDAP status: Up to date  Flu Vaccine status: Up to date  Pneumococcal vaccine status: Up to date  Covid-19 vaccine status: Completed vaccines  Qualifies for Shingles Vaccine? Yes   Zostavax completed No   Shingrix Completed?: No.    Education has been provided regarding the importance of this vaccine. Patient has been advised to call insurance company to determine out of pocket expense if they have not yet received this vaccine. Advised may also receive  vaccine at local pharmacy or Health Dept. Verbalized acceptance and understanding.  Screening Tests Health Maintenance  Topic Date Due   COVID-19 Vaccine (5 - 2023-24 season) 10/05/2022 (Originally 01/12/2022)   Zoster Vaccines- Shingrix (1 of 2) 12/20/2022 (Originally 03/23/1952)   INFLUENZA VACCINE  12/13/2022   Medicare Annual Wellness (AWV)  09/19/2023   DTaP/Tdap/Td (3 - Td or Tdap) 04/14/2031   Pneumonia Vaccine 70+ Years old  Completed   HPV VACCINES  Aged Out    Health Maintenance  There are no preventive care reminders to display for this patient.   Colorectal cancer screening: No longer required.   Lung Cancer Screening: (Low Dose CT Chest recommended if Age 58-80 years, 30 pack-year currently smoking OR have quit w/in 15years.) does qualify.   Lung Cancer Screening Referral: Deferred  Additional Screening:  Hepatitis C Screening: does not qualify; Completed   Vision Screening: Recommended annual ophthalmology exams for early detection of glaucoma and other disorders of the eye. Is the patient up to date with their  annual eye exam?  Yes  Who is the provider or what is the name of the office in which the patient attends annual eye exams? [facility] If pt is not established with a provider, would they like to be referred to a provider to establish care? No .   Dental Screening: Recommended annual dental exams for proper oral hygiene  Community Resource Referral / Chronic Care Management:  CRR required this visit?  No   CCM required this visit?  No      Plan:     I have personally reviewed and noted the following in the patient's chart:   Medical and social history Use of alcohol, tobacco or illicit drugs  Current medications and supplements including opioid prescriptions. Patient is not currently taking opioid prescriptions. Functional ability and status Nutritional status Physical activity Advanced directives List of other  physicians Hospitalizations, surgeries, and ER visits in previous 12 months Vitals Screenings to include cognitive, depression, and falls Referrals and appointments  In addition, I have reviewed and discussed with patient certain preventive protocols, quality metrics, and best practice recommendations. A written personalized care plan for preventive services as well as general preventive health recommendations were provided to patient.     Tillie Rung, LPN   05/19/1094   Nurse Notes: None

## 2022-09-19 NOTE — Progress Notes (Signed)
Remote pacemaker transmission.   

## 2022-09-19 NOTE — Patient Instructions (Addendum)
[name] , Thank you for taking time to come for your Medicare Wellness Visit. I appreciate your ongoing commitment to your health goals. Please review the following plan we discussed and let me know if I can assist you in the future.   These are the goals we discussed:  Goals       Patient Stated      Improve walking       Patient Stated      09/12/2021, no goals      Quit Smoking      What are the options to eating;  One option would be to chew gum Another options would be computer        Quit Smoking (pt-stated)        This is a list of the screening recommended for you and due dates:  Health Maintenance  Topic Date Due   COVID-19 Vaccine (5 - 2023-24 season) 10/05/2022*   Zoster (Shingles) Vaccine (1 of 2) 12/20/2022*   Flu Shot  12/13/2022   Medicare Annual Wellness Visit  09/19/2023   DTaP/Tdap/Td vaccine (3 - Td or Tdap) 04/14/2031   Pneumonia Vaccine  Completed   HPV Vaccine  Aged Out  *Topic was postponed. The date shown is not the original due date.    Advanced directives: Please bring a copy of your health care power of attorney and living will to the office to be added to your chart at your convenience.   Conditions/risks identified: None  Next appointment: Follow up in one year for your annual wellness visit.   Preventive Care 17 Years and Older, Male  Preventive care refers to lifestyle choices and visits with your health care provider that can promote health and wellness. What does preventive care include? A yearly physical exam. This is also called an annual well check. Dental exams once or twice a year. Routine eye exams. Ask your health care provider how often you should have your eyes checked. Personal lifestyle choices, including: Daily care of your teeth and gums. Regular physical activity. Eating a healthy diet. Avoiding tobacco and drug use. Limiting alcohol use. Practicing safe sex. Taking low doses of aspirin every day. Taking vitamin  and mineral supplements as recommended by your health care provider. What happens during an annual well check? The services and screenings done by your health care provider during your annual well check will depend on your age, overall health, lifestyle risk factors, and family history of disease. Counseling  Your health care provider may ask you questions about your: Alcohol use. Tobacco use. Drug use. Emotional well-being. Home and relationship well-being. Sexual activity. Eating habits. History of falls. Memory and ability to understand (cognition). Work and work Astronomer. Screening  You may have the following tests or measurements: Height, weight, and BMI. Blood pressure. Lipid and cholesterol levels. These may be checked every 5 years, or more frequently if you are over 75 years old. Skin check. Lung cancer screening. You may have this screening every year starting at age 63 if you have a 30-pack-year history of smoking and currently smoke or have quit within the past 15 years. Fecal occult blood test (FOBT) of the stool. You may have this test every year starting at age 82. Flexible sigmoidoscopy or colonoscopy. You may have a sigmoidoscopy every 5 years or a colonoscopy every 10 years starting at age 41. Prostate cancer screening. Recommendations will vary depending on your family history and other risks. Hepatitis C blood test. Hepatitis B blood test.  Sexually transmitted disease (STD) testing. Diabetes screening. This is done by checking your blood sugar (glucose) after you have not eaten for a while (fasting). You may have this done every 1-3 years. Abdominal aortic aneurysm (AAA) screening. You may need this if you are a current or former smoker. Osteoporosis. You may be screened starting at age 48 if you are at high risk. Talk with your health care provider about your test results, treatment options, and if necessary, the need for more tests. Vaccines  Your health care  provider may recommend certain vaccines, such as: Influenza vaccine. This is recommended every year. Tetanus, diphtheria, and acellular pertussis (Tdap, Td) vaccine. You may need a Td booster every 10 years. Zoster vaccine. You may need this after age 67. Pneumococcal 13-valent conjugate (PCV13) vaccine. One dose is recommended after age 58. Pneumococcal polysaccharide (PPSV23) vaccine. One dose is recommended after age 41. Talk to your health care provider about which screenings and vaccines you need and how often you need them. This information is not intended to replace advice given to you by your health care provider. Make sure you discuss any questions you have with your health care provider. Document Released: 05/27/2015 Document Revised: 01/18/2016 Document Reviewed: 03/01/2015 Elsevier Interactive Patient Education  2017 [employer].  Fall Prevention in the Home Falls can cause injuries. They can happen to people of all ages. There are many things you can do to make your home safe and to help prevent falls. What can I do on the outside of my home? Regularly fix the edges of walkways and driveways and fix any cracks. Remove anything that might make you trip as you walk through a door, such as a raised step or threshold. Trim any bushes or trees on the path to your home. Use bright outdoor lighting. Clear any walking paths of anything that might make someone trip, such as rocks or tools. Regularly check to see if handrails are loose or broken. Make sure that both sides of any steps have handrails. Any raised decks and porches should have guardrails on the edges. Have any leaves, snow, or ice cleared regularly. Use sand or salt on walking paths during winter. Clean up any spills in your garage right away. This includes oil or grease spills. What can I do in the bathroom? Use night lights. Install grab bars by the toilet and in the tub and shower. Do not use towel bars as grab  bars. Use non-skid mats or decals in the tub or shower. If you need to sit down in the shower, use a plastic, non-slip stool. Keep the floor dry. Clean up any water that spills on the floor as soon as it happens. Remove soap buildup in the tub or shower regularly. Attach bath mats securely with double-sided non-slip rug tape. Do not have throw rugs and other things on the floor that can make you trip. What can I do in the bedroom? Use night lights. Make sure that you have a light by your bed that is easy to reach. Do not use any sheets or blankets that are too big for your bed. They should not hang down onto the floor. Have a firm chair that has side arms. You can use this for support while you get dressed. Do not have throw rugs and other things on the floor that can make you trip. What can I do in the kitchen? Clean up any spills right away. Avoid walking on wet floors. Keep items that you  use a lot in easy-to-reach places. If you need to reach something above you, use a strong step stool that has a grab bar. Keep electrical cords out of the way. Do not use floor polish or wax that makes floors slippery. If you must use wax, use non-skid floor wax. Do not have throw rugs and other things on the floor that can make you trip. What can I do with my stairs? Do not leave any items on the stairs. Make sure that there are handrails on both sides of the stairs and use them. Fix handrails that are broken or loose. Make sure that handrails are as long as the stairways. Check any carpeting to make sure that it is firmly attached to the stairs. Fix any carpet that is loose or worn. Avoid having throw rugs at the top or bottom of the stairs. If you do have throw rugs, attach them to the floor with carpet tape. Make sure that you have a light switch at the top of the stairs and the bottom of the stairs. If you do not have them, ask someone to add them for you. What else can I do to help prevent  falls? Wear shoes that: Do not have high heels. Have rubber bottoms. Are comfortable and fit you well. Are closed at the toe. Do not wear sandals. If you use a stepladder: Make sure that it is fully opened. Do not climb a closed stepladder. Make sure that both sides of the stepladder are locked into place. Ask someone to hold it for you, if possible. Clearly mark and make sure that you can see: Any grab bars or handrails. First and last steps. Where the edge of each step is. Use tools that help you move around (mobility aids) if they are needed. These include: Canes. Walkers. Scooters. Crutches. Turn on the lights when you go into a dark area. Replace any light bulbs as soon as they burn out. Set up your furniture so you have a clear path. Avoid moving your furniture around. If any of your floors are uneven, fix them. If there are any pets around you, be aware of where they are. Review your medicines with your doctor. Some medicines can make you feel dizzy. This can increase your chance of falling. Ask your doctor what other things that you can do to help prevent falls. This information is not intended to replace advice given to you by your health care provider. Make sure you discuss any questions you have with your health care provider. Document Released: 02/24/2009 Document Revised: 10/06/2015 Document Reviewed: 06/04/2014 Elsevier Interactive Patient Education  2017 [employer].

## 2022-09-20 LAB — CUP PACEART REMOTE DEVICE CHECK
Battery Remaining Longevity: 1 mo
Battery Voltage: 2.83 V
Brady Statistic AP VP Percent: 33.79 %
Brady Statistic AP VS Percent: 59.53 %
Brady Statistic AS VP Percent: 1.21 %
Brady Statistic AS VS Percent: 5.47 %
Brady Statistic RA Percent Paced: 90.86 %
Brady Statistic RV Percent Paced: 34.73 %
Date Time Interrogation Session: 20240508143730
Implantable Lead Connection Status: 753985
Implantable Lead Connection Status: 753985
Implantable Lead Implant Date: 20141202
Implantable Lead Implant Date: 20141202
Implantable Lead Location: 753859
Implantable Lead Location: 753860
Implantable Lead Model: 5076
Implantable Lead Model: 5076
Implantable Pulse Generator Implant Date: 20141202
Lead Channel Impedance Value: 361 Ohm
Lead Channel Impedance Value: 361 Ohm
Lead Channel Impedance Value: 399 Ohm
Lead Channel Impedance Value: 475 Ohm
Lead Channel Pacing Threshold Amplitude: 0.625 V
Lead Channel Pacing Threshold Amplitude: 1.125 V
Lead Channel Pacing Threshold Pulse Width: 0.4 ms
Lead Channel Pacing Threshold Pulse Width: 0.4 ms
Lead Channel Sensing Intrinsic Amplitude: 19.125 mV
Lead Channel Sensing Intrinsic Amplitude: 19.125 mV
Lead Channel Sensing Intrinsic Amplitude: 2.25 mV
Lead Channel Sensing Intrinsic Amplitude: 2.25 mV
Lead Channel Setting Pacing Amplitude: 2 V
Lead Channel Setting Pacing Amplitude: 3.25 V
Lead Channel Setting Pacing Pulse Width: 0.4 ms
Lead Channel Setting Sensing Sensitivity: 0.9 mV
Zone Setting Status: 755011
Zone Setting Status: 755011

## 2022-09-25 ENCOUNTER — Encounter: Payer: Self-pay | Admitting: General Practice

## 2022-10-05 ENCOUNTER — Telehealth: Payer: Self-pay | Admitting: Emergency Medicine

## 2022-10-05 NOTE — Telephone Encounter (Signed)
Spoke with the pt and informed him that Dr Royann Shivers wanted him to be seen in 2 months; Appt made for 12/06/22 at 0820. He verbalized understanding.

## 2022-10-16 ENCOUNTER — Ambulatory Visit (INDEPENDENT_AMBULATORY_CARE_PROVIDER_SITE_OTHER): Payer: Medicare HMO | Admitting: Family Medicine

## 2022-10-16 ENCOUNTER — Telehealth: Payer: Self-pay | Admitting: Cardiovascular Disease

## 2022-10-16 ENCOUNTER — Encounter: Payer: Self-pay | Admitting: Family Medicine

## 2022-10-16 VITALS — BP 130/82 | HR 88 | Temp 97.6°F | Ht 69.0 in | Wt 209.8 lb

## 2022-10-16 DIAGNOSIS — Z8546 Personal history of malignant neoplasm of prostate: Secondary | ICD-10-CM | POA: Diagnosis not present

## 2022-10-16 DIAGNOSIS — I10 Essential (primary) hypertension: Secondary | ICD-10-CM | POA: Diagnosis not present

## 2022-10-16 DIAGNOSIS — I480 Paroxysmal atrial fibrillation: Secondary | ICD-10-CM

## 2022-10-16 DIAGNOSIS — N184 Chronic kidney disease, stage 4 (severe): Secondary | ICD-10-CM

## 2022-10-16 DIAGNOSIS — E118 Type 2 diabetes mellitus with unspecified complications: Secondary | ICD-10-CM | POA: Diagnosis not present

## 2022-10-16 DIAGNOSIS — C61 Malignant neoplasm of prostate: Secondary | ICD-10-CM

## 2022-10-16 DIAGNOSIS — E7800 Pure hypercholesterolemia, unspecified: Secondary | ICD-10-CM | POA: Diagnosis not present

## 2022-10-16 LAB — POCT GLYCOSYLATED HEMOGLOBIN (HGB A1C): Hemoglobin A1C: 6.3 % — AB (ref 4.0–5.6)

## 2022-10-16 MED ORDER — APIXABAN 2.5 MG PO TABS: ORAL_TABLET | ORAL | 0 refills | Status: AC

## 2022-10-16 NOTE — Telephone Encounter (Signed)
Calling back to get clarification on what they need help with on the application.   Left message on home phone  My direct number until 5pm is [phone]

## 2022-10-16 NOTE — Telephone Encounter (Signed)
Wife was returning call. Please advise ?

## 2022-10-16 NOTE — Telephone Encounter (Signed)
Patient states he has been out of his Eliquis 2.5 mg and has not been able to afford medication. He would like samples and help with patient assistance application.

## 2022-10-16 NOTE — Telephone Encounter (Signed)
Ok to give 1-2 weeks of samples as long as pt fills out pt assistance application, cannot rely on samples long-term. 2.5mg  dose is correct.

## 2022-10-16 NOTE — Progress Notes (Signed)
Remote pacemaker transmission.   

## 2022-10-16 NOTE — Progress Notes (Signed)
Established Patient Office Visit  Subjective   Patient ID: [name], male    DOB: August 21, 1932  Age: 89 y.o. MRN: [id number]  No chief complaint on file.   HPI   [name] is here for routine medical follow-up.  He has multiple chronic medical problems including history of CVA, hypertension, atrial fibrillation, sick sinus syndrome, obstructive sleep apnea, tobacco use with ongoing smoking, chronic kidney disease stage IV, metastatic prostate cancer.  He has pacemaker and has follow-up scheduled with cardiology soon to reassess that.  He states his battery will have to be replaced very soon.  He remains on low-dose Eliquis 2.5 mg twice daily.  Other medications include amlodipine 5 mg daily, metoprolol 50 mg twice daily, rosuvastatin 20 mg daily, and Xtandi 40 mg 2 tablets daily.  Appetite and weight stable.  Denies any recent peripheral edema issues.  No chest pains.  Chronic dyspnea on exertion unchanged.  Still smokes about 1 pack cigarettes per day.  Denies chronic cough.  No recent hemoptysis.  Very sedentary and limited in activities.  Used to be an avid Teacher, English as a foreign language.  Has not played in about 3 years.  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/14/2013   DUAL CHAMBER PACEMAKER   Paroxysmal atrial fibrillation (HCC)    Unspecified cerebral artery occlusion with cerebral infarction    Unspecified disorder resulting from impaired renal function    Unspecified essential hypertension    Wears glasses    Wears partial dentures    upper partial   Past Surgical History:  Procedure Laterality Date   APPLICATION OF A-CELL OF  EXTREMITY Right 08/12/2013   Procedure: APPLICATION OF A-CELL OF EXTREMITY;  Surgeon: Wayland Denis, DO;  Location: [facility];  Service: Plastics;  Laterality: Right;   CHOLECYSTECTOMY  1987   COLONOSCOPY     ent surgery  2004   correct snoring   INGUINAL HERNIA REPAIR     left   INSERT / REPLACE / REMOVE PACEMAKER  04/14/2013   dual chamber   / Dr Royann Shivers   MINOR IRRIGATION AND DEBRIDEMENT OF WOUND Right 03/04/2013   Procedure: MINOR IRRIGATION AND DEBRIDEMENT OF WOUND;  Surgeon: Wayland Denis, DO;  Location: [facility];  Service: Plastics;  Laterality: Right;   ORCHIECTOMY  2002   PERMANENT PACEMAKER INSERTION N/A 04/14/2013   Procedure: PERMANENT PACEMAKER INSERTION;  Surgeon: Thurmon Fair, MD;  Location: [facility];  Service: Cardiovascular;  Laterality: N/A;   SKIN SPLIT GRAFT Right 08/12/2013   Procedure: SKIN GRAFT SPLIT THICKNESS WITH PLACEMENT OF VAC TO RIGHT LOWER LEG/PLACEMENT OF ACELL TO RIGHT UPPER THIGH AREA (HARVEST SITE);  Surgeon: Wayland Denis, DO;  Location: [facility];  Service: Plastics;  Laterality: Right;    reports that he has been smoking cigarettes. He has a 15.00 pack-year smoking history. He has never used smokeless tobacco. He reports that he does not drink alcohol and does not use drugs. family history includes Cancer in his father and sister; Prostate cancer in his father. No Known Allergies  Review of Systems  Constitutional:  Positive for malaise/fatigue. Negative for chills and fever.  Eyes:  Negative  for blurred vision.  Respiratory:  Positive for shortness of breath. Negative for cough and hemoptysis.   Cardiovascular:  Negative for chest pain.  Gastrointestinal:  Negative for abdominal pain.  Neurological:  Negative for dizziness, weakness and headaches.      Objective:     BP 130/82 (BP Location: Left Arm, Cuff Size: Normal)   Pulse 88   Temp 97.6 F (36.4 C) (Oral)   Ht 5\' 9"  (1.753 m)   Wt 209  lb 12.8 oz (95.2 kg)   SpO2 95%   BMI 30.98 kg/m  BP Readings from Last 3 Encounters:  10/16/22 130/82  09/19/22 120/76  07/18/22 (!) 130/90   Wt Readings from Last 3 Encounters:  10/16/22 209 lb 12.8 oz (95.2 kg)  09/19/22 209 lb 11.2 oz (95.1 kg)  07/18/22 211 lb 11.2 oz (96 kg)      Physical Exam Vitals reviewed.  Constitutional:      General: He is not in acute distress. Cardiovascular:     Rate and Rhythm: Normal rate and regular rhythm.  Pulmonary:     Comments: Slightly diminished breath sounds throughout. Musculoskeletal:     Comments: Trace edema lower legs bilaterally  Neurological:     Mental Status: He is alert.      Results for orders placed or performed in visit on 10/16/22  POC HgB A1c  Result Value Ref Range   Hemoglobin A1C 6.3 (A) 4.0 - 5.6 %   HbA1c POC (<> result, manual entry)     HbA1c, POC (prediabetic range)     HbA1c, POC (controlled diabetic range)      Last CBC Lab Results  Component Value Date   WBC 8.8 02/06/2022   HGB 13.0 02/06/2022   HCT 39.8 02/06/2022   MCV 89.1 02/06/2022   MCH 30.1 03/18/2019   RDW 16.0 (H) 02/06/2022   PLT 187.0 02/06/2022   Last metabolic panel Lab Results  Component Value Date   GLUCOSE 119 (H) 02/06/2022   NA 144 02/06/2022   K 4.2 02/06/2022   CL 111 02/06/2022   CO2 28 02/06/2022   BUN 24 (H) 02/06/2022   CREATININE 2.13 (H) 02/06/2022   GFRNONAA 30 (L) 03/18/2019   CALCIUM 10.4 02/06/2022   PROT 7.1 02/06/2022   ALBUMIN 3.7 02/06/2022   BILITOT 0.3 02/06/2022   ALKPHOS 132 (H) 02/06/2022   AST 16 02/06/2022   ALT 12 02/06/2022   ANIONGAP 12 03/18/2019   Last lipids Lab Results  Component Value Date   CHOL 149 02/06/2022   HDL 48.00 02/06/2022   LDLCALC 71 02/06/2022   LDLDIRECT 112.0 05/17/2017   TRIG 149.0 02/06/2022   CHOLHDL 3 02/06/2022   Last hemoglobin A1c Lab Results  Component Value Date   HGBA1C 6.3 (A) 10/16/2022      The ASCVD Risk score (Arnett DK, et al.,  2019) failed to calculate for the following reasons:   The 2019 ASCVD risk score is only valid for ages 73 to 9   The patient has a prior MI or stroke diagnosis    Assessment & Plan:   #1 type 2 diabetes well-controlled with A1c today 6.3%.  Continue low glycemic diet.  Reassess A1c within 6 months  #2 chronic kidney disease stage IV.  Avoid all nonsteroidals.  Needs follow-up labs soon.  He declines today.  Will plan follow-up in 3 months and recheck basic metabolic panel then.  #3 dyslipidemia.  Patient on rosuvastatin.  Fasting lipids at follow-up  #4  history of atrial fibrillation and sick sinus syndrome.  Patient on Eliquis and metoprolol.  Continue close follow-up with cardiology  #5 hypertension.  Fairly well-controlled on metoprolol and amlodipine.  Continue close monitoring at home  #6 history of metastatic prostate cancer followed by urology.   Return in about 3 months (around 01/16/2023).    Evelena Peat, MD

## 2022-10-16 NOTE — Telephone Encounter (Signed)
Pt c/o medication issue:  1. Name of Medication:   apixaban (ELIQUIS) 2.5 MG TABS tablet   2. How are you currently taking this medication (dosage and times per day)?  As prescribed  3. Are you having a reaction (difficulty breathing--STAT)?   Sometime hands and feet get cold  4. What is your medication issue?   Wife stated patient has been completely out of this medication for the last two weeks and she wants to get assistance with getting this medication.

## 2022-10-16 NOTE — Telephone Encounter (Signed)
Spoke with the patient. Spouse- who called earlier about needing help with the application is not there at this time.  I let the patient know that I left 2 weeks worth of samples and a new application at the front desk.  I just need to know what Joan-spouse is needing help with regarding the application.   Pt says to call back in a few minutes and his wife should be back.

## 2022-10-16 NOTE — Addendum Note (Signed)
Addended by: Scheryl Marten on: 10/16/2022 03:52 PM   Modules accepted: Orders

## 2022-10-17 ENCOUNTER — Ambulatory Visit (INDEPENDENT_AMBULATORY_CARE_PROVIDER_SITE_OTHER): Payer: Medicare HMO

## 2022-10-17 DIAGNOSIS — I495 Sick sinus syndrome: Secondary | ICD-10-CM

## 2022-10-18 ENCOUNTER — Telehealth: Payer: Self-pay

## 2022-10-18 LAB — CUP PACEART REMOTE DEVICE CHECK
Battery Remaining Longevity: 1 mo
Battery Voltage: 2.83 V
Brady Statistic AP VP Percent: 26.72 %
Brady Statistic AP VS Percent: 67.14 %
Brady Statistic AS VP Percent: 0.99 %
Brady Statistic AS VS Percent: 5.14 %
Brady Statistic RA Percent Paced: 91.2 %
Brady Statistic RV Percent Paced: 27.5 %
Date Time Interrogation Session: 20240606100148
Implantable Lead Connection Status: 753985
Implantable Lead Connection Status: 753985
Implantable Lead Implant Date: 20141202
Implantable Lead Implant Date: 20141202
Implantable Lead Location: 753859
Implantable Lead Location: 753860
Implantable Lead Model: 5076
Implantable Lead Model: 5076
Implantable Pulse Generator Implant Date: 20141202
Lead Channel Impedance Value: 361 Ohm
Lead Channel Impedance Value: 361 Ohm
Lead Channel Impedance Value: 399 Ohm
Lead Channel Impedance Value: 437 Ohm
Lead Channel Pacing Threshold Amplitude: 0.625 V
Lead Channel Pacing Threshold Amplitude: 1.125 V
Lead Channel Pacing Threshold Pulse Width: 0.4 ms
Lead Channel Pacing Threshold Pulse Width: 0.4 ms
Lead Channel Sensing Intrinsic Amplitude: 16.625 mV
Lead Channel Sensing Intrinsic Amplitude: 16.625 mV
Lead Channel Sensing Intrinsic Amplitude: 2 mV
Lead Channel Sensing Intrinsic Amplitude: 2 mV
Lead Channel Setting Pacing Amplitude: 2 V
Lead Channel Setting Pacing Amplitude: 2.75 V
Lead Channel Setting Pacing Pulse Width: 0.4 ms
Lead Channel Setting Sensing Sensitivity: 0.9 mV
Zone Setting Status: 755011
Zone Setting Status: 755011

## 2022-10-18 NOTE — Telephone Encounter (Signed)
Wife states she has the application and just needed to know what paperwork to bring in .  She will bring paperwork and proof of income on Monday

## 2022-10-18 NOTE — Telephone Encounter (Signed)
Monthly battery check.  Device has reached RRT 5/18 - route to triage Normal device function. 22 SVT, longest duration 41sec, HR's 150's-160's, EGM show AF with rapid response 505 AF events, longest duration , not always good rate control, burden 5%, Eliquis per EPIC Follow up as scheduled monthly LA, CVRS   Patient needs to be scheduled with Dr. Royann Shivers for gen change discussion/planning. Also review increase in AF burden from 3% -5% with declining v rate control

## 2022-10-24 ENCOUNTER — Telehealth: Payer: Self-pay | Admitting: Emergency Medicine

## 2022-10-24 MED ORDER — APIXABAN 2.5 MG PO TABS: 2.5000 mg | ORAL_TABLET | Freq: Two times a day (BID) | ORAL | 0 refills | Status: AC

## 2022-10-24 NOTE — Telephone Encounter (Signed)
Pt wife called in stating she gave you some wrong information and would like to speak with you again.

## 2022-10-24 NOTE — Telephone Encounter (Signed)
Pt states that the his wife gave me the wrong number for total income/yearly= $66,997.00

## 2022-10-24 NOTE — Telephone Encounter (Signed)
Pt's wife came to the clinic and brought paperwork. She had several questions about what is needed for the pt assistance application for Eliquis and also requested samples. I went to Williemae Natter, Child psychotherapist for help and clarification. I also went to our Pharmacist to ask if pt can have some samples- given orders that pt may receive some samples. Made copies of all the forms needed for the Patient Assistance Application and informed that we still need Out of Pocket prescription expenses for the patient and wife. Also asked them to call their insurance company to ask how much this RX costs monthly without assistance. Informed that they may "make too much" to qualify for assistance.   She verbalized understanding.  2 weeks worth of samples given.

## 2022-10-24 NOTE — Telephone Encounter (Signed)
H&V Care Navigation CSW Progress Note  Pt BMS application missing OOP expenses for he and his wife ("You need to have spent at least 3% of your yearly household income on out-of-pocket prescription expenses for you and/or your household members in the year for which you are seeking assistance from [facility]). There were a few other pieces of missing information on the application which I have highlighted for completion. Assisted RN Florentina Addison with noting missing information and she will discuss with pt wife.    Patient is participating in a Managed Medicaid Plan:  No.   SDOH Screenings   Food Insecurity: No Food Insecurity (09/19/2022)  Housing: Low Risk  (09/19/2022)  Transportation Needs: No Transportation Needs (09/19/2022)  Utilities: Not At Risk (09/19/2022)  Alcohol Screen: Low Risk  (09/19/2022)  Depression (PHQ2-9): Low Risk  (09/19/2022)  Financial Resource Strain: Low Risk  (09/19/2022)  Physical Activity: Insufficiently Active (09/19/2022)  Social Connections: Socially Integrated (09/19/2022)  Stress: No Stress Concern Present (09/19/2022)  Tobacco Use: High Risk (10/16/2022)   Octavio Graves, MSW, LCSW Clinical Social Worker II [facility]Vascular Care Navigation  [phone]- work cell phone (preferred) [phone]- desk phone

## 2022-10-29 NOTE — Telephone Encounter (Signed)
Since he has an appt on 7/25, can we please go ahead and book him for a generator change the following Thursday, August 1. Thanks.

## 2022-11-02 NOTE — Telephone Encounter (Signed)
Informed pt that his Pacemaker Gen change is scheduled for 12/13/22 at 1330; will go over all instructions at his appt on 12/06/22. He verbalized understanding.

## 2022-11-12 NOTE — Addendum Note (Signed)
Addended by: Elease Etienne A on: 11/12/2022 12:21 PM   Modules accepted: Level of Service

## 2022-11-12 NOTE — Progress Notes (Signed)
Remote pacemaker transmission.   

## 2022-11-16 ENCOUNTER — Ambulatory Visit (INDEPENDENT_AMBULATORY_CARE_PROVIDER_SITE_OTHER): Payer: Medicare HMO

## 2022-11-16 DIAGNOSIS — I495 Sick sinus syndrome: Secondary | ICD-10-CM | POA: Diagnosis not present

## 2022-11-19 LAB — CUP PACEART REMOTE DEVICE CHECK
Battery Remaining Longevity: 1 mo
Battery Voltage: 2.82 V
Brady Statistic AP VP Percent: 32.76 %
Brady Statistic AP VS Percent: 62.34 %
Brady Statistic AS VP Percent: 1.55 %
Brady Statistic AS VS Percent: 3.35 %
Brady Statistic RA Percent Paced: 93.71 %
Brady Statistic RV Percent Paced: 34.28 %
Date Time Interrogation Session: 20240708101100
Implantable Lead Connection Status: 753985
Implantable Lead Connection Status: 753985
Implantable Lead Implant Date: 20141202
Implantable Lead Implant Date: 20141202
Implantable Lead Location: 753859
Implantable Lead Location: 753860
Implantable Lead Model: 5076
Implantable Lead Model: 5076
Implantable Pulse Generator Implant Date: 20141202
Lead Channel Impedance Value: 342 Ohm
Lead Channel Impedance Value: 342 Ohm
Lead Channel Impedance Value: 380 Ohm
Lead Channel Impedance Value: 437 Ohm
Lead Channel Pacing Threshold Amplitude: 0.625 V
Lead Channel Pacing Threshold Amplitude: 1.125 V
Lead Channel Pacing Threshold Pulse Width: 0.4 ms
Lead Channel Pacing Threshold Pulse Width: 0.4 ms
Lead Channel Sensing Intrinsic Amplitude: 2.25 mV
Lead Channel Sensing Intrinsic Amplitude: 2.25 mV
Lead Channel Sensing Intrinsic Amplitude: 20.5 mV
Lead Channel Sensing Intrinsic Amplitude: 20.5 mV
Lead Channel Setting Pacing Amplitude: 2 V
Lead Channel Setting Pacing Amplitude: 3.25 V
Lead Channel Setting Pacing Pulse Width: 0.4 ms
Lead Channel Setting Sensing Sensitivity: 0.9 mV
Zone Setting Status: 755011
Zone Setting Status: 755011

## 2022-12-05 NOTE — Progress Notes (Signed)
Remote pacemaker transmission.   

## 2022-12-06 ENCOUNTER — Ambulatory Visit: Payer: Medicare HMO | Admitting: Cardiovascular Disease

## 2022-12-13 ENCOUNTER — Telehealth: Payer: Self-pay | Admitting: Emergency Medicine

## 2022-12-13 ENCOUNTER — Ambulatory Visit (HOSPITAL_COMMUNITY)
Admission: RE | Admit: 2022-12-13 | Discharge: 2022-12-13 | Disposition: A | Discharge status: Home / Self Care | Payer: Medicare HMO | Attending: Cardiovascular Disease | Admitting: Cardiovascular Disease

## 2022-12-13 ENCOUNTER — Other Ambulatory Visit: Payer: Self-pay

## 2022-12-13 ENCOUNTER — Encounter (HOSPITAL_COMMUNITY)
Admission: RE | Disposition: A | Discharge status: Home / Self Care | Payer: Self-pay | Source: Home / Self Care | Attending: Cardiovascular Disease

## 2022-12-13 DIAGNOSIS — Z79899 Other long term (current) drug therapy: Secondary | ICD-10-CM | POA: Insufficient documentation

## 2022-12-13 DIAGNOSIS — I495 Sick sinus syndrome: Secondary | ICD-10-CM | POA: Insufficient documentation

## 2022-12-13 DIAGNOSIS — G4733 Obstructive sleep apnea (adult) (pediatric): Secondary | ICD-10-CM | POA: Insufficient documentation

## 2022-12-13 DIAGNOSIS — F17210 Nicotine dependence, cigarettes, uncomplicated: Secondary | ICD-10-CM | POA: Insufficient documentation

## 2022-12-13 DIAGNOSIS — I480 Paroxysmal atrial fibrillation: Secondary | ICD-10-CM | POA: Diagnosis not present

## 2022-12-13 DIAGNOSIS — I509 Heart failure, unspecified: Secondary | ICD-10-CM | POA: Diagnosis not present

## 2022-12-13 DIAGNOSIS — E1122 Type 2 diabetes mellitus with diabetic chronic kidney disease: Secondary | ICD-10-CM | POA: Insufficient documentation

## 2022-12-13 DIAGNOSIS — Z8546 Personal history of malignant neoplasm of prostate: Secondary | ICD-10-CM | POA: Insufficient documentation

## 2022-12-13 DIAGNOSIS — Z45010 Encounter for checking and testing of cardiac pacemaker pulse generator [battery]: Secondary | ICD-10-CM | POA: Diagnosis not present

## 2022-12-13 DIAGNOSIS — Z7901 Long term (current) use of anticoagulants: Secondary | ICD-10-CM | POA: Insufficient documentation

## 2022-12-13 DIAGNOSIS — I130 Hypertensive heart and chronic kidney disease with heart failure and stage 1 through stage 4 chronic kidney disease, or unspecified chronic kidney disease: Secondary | ICD-10-CM | POA: Insufficient documentation

## 2022-12-13 DIAGNOSIS — E785 Hyperlipidemia, unspecified: Secondary | ICD-10-CM | POA: Insufficient documentation

## 2022-12-13 DIAGNOSIS — N189 Chronic kidney disease, unspecified: Secondary | ICD-10-CM | POA: Diagnosis not present

## 2022-12-13 DIAGNOSIS — Z950 Presence of cardiac pacemaker: Secondary | ICD-10-CM

## 2022-12-13 HISTORY — PX: PPM GENERATOR CHANGEOUT: EP1233

## 2022-12-13 LAB — BASIC METABOLIC PANEL
Anion gap: 8 (ref 5–15)
BUN: 30 mg/dL — ABNORMAL HIGH (ref 8–23)
CO2: 23 mmol/L (ref 22–32)
Calcium: 10.5 mg/dL — ABNORMAL HIGH (ref 8.9–10.3)
Chloride: 109 mmol/L (ref 98–111)
Creatinine, Ser: 2.54 mg/dL — ABNORMAL HIGH (ref 0.61–1.24)
GFR, Estimated: 24 mL/min — ABNORMAL LOW (ref >60)
Glucose, Bld: 125 mg/dL — ABNORMAL HIGH (ref 70–99)
Potassium: 4.7 mmol/L (ref 3.5–5.1)
Sodium: 140 mmol/L (ref 135–145)

## 2022-12-13 LAB — CBC
HCT: 42.9 % (ref 39.0–52.0)
Hemoglobin: 13.2 g/dL (ref 13.0–17.0)
MCH: 28.8 pg (ref 26.0–34.0)
MCHC: 30.8 g/dL (ref 30.0–36.0)
MCV: 93.5 fL (ref 80.0–100.0)
Platelets: 187 10*3/uL (ref 150–400)
RBC: 4.59 MIL/uL (ref 4.22–5.81)
RDW: 14.5 % (ref 11.5–15.5)
WBC: 10.8 10*3/uL — ABNORMAL HIGH (ref 4.0–10.5)
nRBC: 0 % (ref 0.0–0.2)

## 2022-12-13 SURGERY — PPM GENERATOR CHANGEOUT

## 2022-12-13 MED ORDER — POVIDONE-IODINE 10 % EX SWAB
2.0000 | Freq: Once | CUTANEOUS | Status: AC
  Administered 2022-12-13: 2 via TOPICAL

## 2022-12-13 MED ORDER — LIDOCAINE HCL (PF) 1 % IJ SOLN
INTRAMUSCULAR | Status: AC
  Filled 2022-12-13: qty 30

## 2022-12-13 MED ORDER — SODIUM CHLORIDE 0.9 % IV SOLN: 80.0000 mg | INTRAVENOUS | Status: AC

## 2022-12-13 MED ORDER — ONDANSETRON HCL 4 MG/2ML IJ SOLN: 4.0000 mg | Freq: Four times a day (QID) | INTRAMUSCULAR | Status: DC | PRN

## 2022-12-13 MED ORDER — ACETAMINOPHEN 325 MG PO TABS: 325.0000 mg | ORAL_TABLET | ORAL | Status: DC | PRN

## 2022-12-13 MED ORDER — CHLORHEXIDINE GLUCONATE 4 % EX SOLN: 4.0000 | Freq: Once | CUTANEOUS | Status: DC

## 2022-12-13 MED ORDER — APIXABAN 2.5 MG PO TABS: 2.5000 mg | ORAL_TABLET | Freq: Two times a day (BID) | ORAL | Status: DC

## 2022-12-13 MED ORDER — SODIUM CHLORIDE 0.9% FLUSH: 3.0000 mL | Freq: Two times a day (BID) | INTRAVENOUS | Status: DC

## 2022-12-13 MED ORDER — SODIUM CHLORIDE 0.9 % IV SOLN: INTRAVENOUS | Status: DC

## 2022-12-13 MED ORDER — SODIUM CHLORIDE 0.9% FLUSH: 3.0000 mL | INTRAVENOUS | Status: DC | PRN

## 2022-12-13 MED ORDER — SODIUM CHLORIDE 0.9 % IV SOLN
INTRAVENOUS | Status: AC
  Administered 2022-12-13: 80 mg
  Filled 2022-12-13: qty 2

## 2022-12-13 MED ORDER — SODIUM CHLORIDE 0.9 % IV SOLN: INTRAVENOUS | Status: DC | PRN

## 2022-12-13 MED ORDER — LIDOCAINE HCL (PF) 1 % IJ SOLN
INTRAMUSCULAR | Status: DC | PRN
  Administered 2022-12-13: 60 mL

## 2022-12-13 MED ORDER — CEFAZOLIN SODIUM-DEXTROSE 2-4 GM/100ML-% IV SOLN: 2.0000 g | INTRAVENOUS | Status: AC

## 2022-12-13 MED ORDER — CEFAZOLIN SODIUM-DEXTROSE 2-4 GM/100ML-% IV SOLN
INTRAVENOUS | Status: AC
  Administered 2022-12-13: 2 g via INTRAVENOUS
  Filled 2022-12-13: qty 100

## 2022-12-13 SURGICAL SUPPLY — 7 items
CABLE SURGICAL S-101-97-12 (CABLE) ×1 IMPLANT
IPG PACE AZUR XT DR MRI W1DR01 (Pacemaker) IMPLANT
PACE AZURE XT DR MRI W1DR01 (Pacemaker) ×1 IMPLANT
PAD DEFIB RADIO PHYSIO CONN (PAD) ×1 IMPLANT
POUCH AIGIS-R ANTIBACT PPM (Mesh General) ×1 IMPLANT
POUCH AIGIS-R ANTIBACT PPM MED (Mesh General) IMPLANT
TRAY PACEMAKER INSERTION (PACKS) ×1 IMPLANT

## 2022-12-13 NOTE — H&P (Signed)
Cardiology Admission History and Physical   Patient ID: [name] MRN: [id number]; DOB: [birth year]   Admission date: 12/13/2022  PCP:  Kristian Covey, MD   [facility] Providers Cardiologist:  Thurmon Fair, MD        Chief Complaint: Pacemaker battery depletion  Patient Profile:   [name] is a 89 y.o. male with sinus node dysfunction and pacemaker at [facility] who is being seen 12/13/2022 for the evaluation of pacemaker generator change out.  History of Present Illness:   [name] sinus node dysfunction ("atrially pacemaker dependent" today), history of paroxysmal atrial fibrillation, hypertension, hyperlipidemia, OSA, chronic kidney disease, metastatic prostate cancer, whose pacemaker (Medtronic advisor dual-chamber MRI conditional device) has recently reached elective replacement indicator.  Only feels well.  Denies angina or dyspnea at rest or with activity, palpitations, dizziness, syncope, leg edema, etc.  He has normal atrioventricular conduction, but when atrial pacing is interrupted there is no underlying atrial or ventricular escape rhythm.  He has a relatively low burden of asymptomatic paroxysmal atrial fibrillation around 2% of the time.  He has 97% atrial pacing and 23% ventricular pacing.  His device has infrequently recorded brief episodes of high ventricular rate which are most likely atrial tachycardia with 1: 1 AV conduction.  Creatinine today is elevated at 2.54, above his usual baseline around 2.0 (typical GFR 25-30).Electrolytes are normal, with the exception of mild hypercalcemia at 10.5.  Most recent lipid profile showed all parameters in target range.  Most recent hemoglobin A1c was in acceptable range at 6.3%.  He is not anemic.   Past Medical History:  Diagnosis Date   Disturbances of sulphur-bearing amino-acid metabolism    Malignant neoplasm of prostate (HCC)    Obstructive sleep apnea (adult) (pediatric) 2004    surgery to coorrect   Osteoarthrosis, unspecified whether generalized or localized, unspecified site    Other and unspecified hyperlipidemia    Other specified cardiac dysrhythmias(427.89)    Pacemaker 04/14/2013   DUAL CHAMBER PACEMAKER   Paroxysmal atrial fibrillation (HCC)    Unspecified cerebral artery occlusion with cerebral infarction    Unspecified disorder resulting from impaired renal function    Unspecified essential hypertension    Wears glasses    Wears partial dentures    upper partial    Past Surgical History:  Procedure Laterality Date   APPLICATION OF A-CELL OF EXTREMITY Right 08/12/2013   Procedure: APPLICATION OF A-CELL OF EXTREMITY;  Surgeon: Wayland Denis, DO;  Location: [facility];  Service: Plastics;  Laterality: Right;   CHOLECYSTECTOMY  1987   COLONOSCOPY     ent surgery  2004   correct snoring   INGUINAL HERNIA REPAIR     left   INSERT / REPLACE / REMOVE PACEMAKER  04/14/2013   dual chamber   / Dr Royann Shivers   MINOR IRRIGATION AND DEBRIDEMENT OF WOUND Right 03/04/2013   Procedure: MINOR IRRIGATION AND DEBRIDEMENT OF WOUND;  Surgeon: Wayland Denis, DO;  Location: [facility];  Service: Plastics;  Laterality: Right;   ORCHIECTOMY  2002   PERMANENT PACEMAKER INSERTION N/A 04/14/2013   Procedure: PERMANENT PACEMAKER INSERTION;  Surgeon: Thurmon Fair, MD;  Location: [facility];  Service: Cardiovascular;  Laterality: N/A;   SKIN SPLIT GRAFT Right 08/12/2013   Procedure: SKIN GRAFT SPLIT THICKNESS WITH PLACEMENT OF VAC TO RIGHT LOWER LEG/PLACEMENT OF ACELL TO RIGHT UPPER THIGH AREA (HARVEST SITE);  Surgeon: Wayland Denis, DO;  Location: [facility];  Service: Government social research officer;  Laterality: Right;     Medications Prior to Admission: Prior to Admission medications   Medication Sig Start Date End Date Taking? Authorizing Provider  acetaminophen (TYLENOL) 325 MG tablet Take 2 tablets (650 mg total) by mouth every 6 (six) hours as  needed. 04/13/21  Yes Tanda Rockers A, DO  albuterol (VENTOLIN HFA) 108 (90 Base) MCG/ACT inhaler Inhale 2 puffs into the lungs every 6 (six) hours as needed for wheezing or shortness of breath. 03/10/21  Yes Burchette, Elberta Fortis, MD  amLODipine (NORVASC) 5 MG tablet TAKE 1 TABLET (5 MG TOTAL) BY MOUTH DAILY. (NEED MD APPOINTMENT) 04/16/22  Yes Yaneliz Radebaugh, MD  apixaban (ELIQUIS) 2.5 MG TABS tablet Take 1 tablet (2.5 mg total) by mouth 2 (two) times daily. 09/10/22  Yes Lawerance Matsuo, MD  Ascorbic Acid (VITAMIN C) 1000 MG tablet Take 1,000 mg by mouth daily.   Yes [provider]  aspirin EC 81 MG EC tablet Take 1 tablet (81 mg total) by mouth daily. 03/20/19  Yes Joseph Art, DO  cholecalciferol (VITAMIN D3) 25 MCG (1000 UNIT) tablet Take 1,000 Units by mouth daily.   Yes [provider]  metoprolol tartrate (LOPRESSOR) 50 MG tablet TAKE 1 TABLET TWICE DAILY 09/05/22  Yes Elsbeth Yearick, MD  Multiple Vitamin (MULTIVITAMIN WITH MINERALS) TABS tablet Take 1 tablet by mouth daily.   Yes [provider]  XTANDI 40 MG capsule Take 80 mg by mouth daily. 11/13/17  Yes [provider]  apixaban (ELIQUIS) 2.5 MG TABS tablet TAKE 1 TABLET(2.5 MG) BY MOUTH TWICE DAILY 10/16/22   Supple, Megan E, RPH-CPP  apixaban (ELIQUIS) 2.5 MG TABS tablet Take 1 tablet (2.5 mg total) by mouth 2 (two) times daily. 10/24/22   Pavero, Cristal Deer, RPH  rosuvastatin (CRESTOR) 20 MG tablet Take 1 tablet (20 mg total) by mouth daily. 05/16/22   Burchette, Elberta Fortis, MD     Allergies:   No Known Allergies  Social History:   Social History   Socioeconomic History   Marital status: Married    Spouse name: Museum/gallery exhibitions officer   Number of children: Not on file   Years of education: Not on file   Highest education level: Not on file  Occupational History   Occupation: Retired   Occupation: RETIRED    Employer: RETIRED  Tobacco Use   Smoking status: Some Days    Current packs/day: 0.50    Average  packs/day: 0.5 packs/day for 30.0 years (15.0 ttl pk-yrs)    Types: Cigarettes   Smokeless tobacco: Never   Tobacco comments:     pt stated he smokes 1 pack every 2 wks  Vaping Use   Vaping status: Never Used  Substance and Sexual Activity   Alcohol use: No   Drug use: No   Sexual activity: Not on file    Comment: started smoking 2-3 cig daily  Other Topics Concern   Not on file  Social History Narrative   Not on file   Social Determinants of Health   Financial Resource Strain: Low Risk  (09/19/2022)   Overall Financial Resource Strain (CARDIA)    Difficulty of Paying Living Expenses: Not hard at all  Food Insecurity: No Food Insecurity (09/19/2022)   Hunger Vital Sign    Worried About Running Out of Food in the Last Year: Never true    Ran Out of Food in the Last Year: Never true  Transportation Needs: No Transportation Needs (09/19/2022)   PRAPARE -  Administrator, Civil Service (Medical): No    Lack of Transportation (Non-Medical): No  Physical Activity: Insufficiently Active (09/19/2022)   Exercise Vital Sign    Days of Exercise per Week: 2 days    Minutes of Exercise per Session: 20 min  Stress: No Stress Concern Present (09/19/2022)   [employer] of Occupational Health - Occupational Stress Questionnaire    Feeling of Stress : Not at all  Social Connections: Socially Integrated (09/19/2022)   Social Connection and Isolation Panel [NHANES]    Frequency of Communication with Friends and Family: More than three times a week    Frequency of Social Gatherings with Friends and Family: More than three times a week    Attends Religious Services: More than 4 times per year    Active Member of [employer] or Organizations: Yes    Attends Banker Meetings: More than 4 times per year    Marital Status: Married  Catering manager Violence: Not At Risk (09/19/2022)   Humiliation, Afraid, Rape, and Kick questionnaire    Fear of Current or Ex-Partner: No    Emotionally  Abused: No    Physically Abused: No    Sexually Abused: No    Family History:   The patient's family history includes Cancer in his father and sister; Prostate cancer in his father.    ROS:  Please see the history of present illness.  All other ROS reviewed and negative.     Physical Exam/Data:   Vitals:   12/13/22 1155  BP: (!) 148/95  Pulse: 72  Resp: 18  Temp: 97.8 F (36.6 C)  TempSrc: Oral  SpO2: 98%  Weight: 95.3 kg  Height: 5\' 9"  (1.753 m)   No intake or output data in the 24 hours ending 12/13/22 1347    12/13/2022   11:55 AM 10/16/2022   12:09 PM 09/19/2022   12:32 PM  Last 3 Weights  Weight (lbs) 210 lb 209 lb 12.8 oz 209 lb 11.2 oz  Weight (kg) 95.255 kg 95.165 kg 95.119 kg     Body mass index is 31.01 kg/m.  General:  Well nourished, well developed, in no acute distress, mildly obese, healthy left subclavian pacemaker site HEENT: normal Neck: no JVD Vascular: No carotid bruits; Distal pulses 2+ bilaterally   Cardiac:  normal S1, S2; RRR; no murmur  Lungs:  clear to auscultation bilaterally, no wheezing, rhonchi or rales  Abd: soft, nontender, no hepatomegaly  Ext: no edema Musculoskeletal:  No deformities, BUE and BLE strength normal and equal Skin: warm and dry  Neuro:  CNs 2-12 intact, no focal abnormalities noted Psych:  Normal affect    EKG:  The ECG that was done 02/07/2022 was personally reviewed and demonstrates atrial fibrillation with mild RVR and occasional ventricular pacing, diffuse ST-T segment changes that are likely rate related Relevant CV Studies: Echocardiogram 2020   1. Left ventricular ejection fraction, by visual estimation, is 60 to  65%. The left ventricle has normal function. There is moderately increased  left ventricular hypertrophy of the basal septum.   2. There is akinesis of the basal inferior wall.   3. Left ventricular diastolic parameters are consistent with Grade I  diastolic dysfunction (impaired relaxation).   4.  The right ventricular size is not well visualized. No increase in  right ventricular wall thickness.   5. Left atrial size was normal.   6. Right atrial size was normal.   7. The mitral valve is normal  in structure. Mild mitral valve  regurgitation. No evidence of mitral stenosis.   8. The tricuspid valve is normal in structure. Tricuspid valve  regurgitation is not demonstrated.   9. The aortic valve is normal in structure. Aortic valve regurgitation is  mild. Mild aortic valve sclerosis without stenosis.  10. The pulmonic valve was normal in structure. Pulmonic valve  regurgitation is trivial.  11. The inferior vena cava is normal in size with greater than 50%  respiratory variability, suggesting right atrial pressure of 3 mmHg.  12. TR signal is inadequate for assessing pulmonary artery systolic  pressure.    Laboratory Data:  High Sensitivity Troponin:  No results for input(s): "TROPONINIHS" in the last 720 hours.    Chemistry Recent Labs  Lab 12/13/22 1132  NA 140  K 4.7  CL 109  CO2 23  GLUCOSE 125*  BUN 30*  CREATININE 2.54*  CALCIUM 10.5*  GFRNONAA 24*  ANIONGAP 8    No results for input(s): "PROT", "ALBUMIN", "AST", "ALT", "ALKPHOS", "BILITOT" in the last 168 hours. Lipids No results for input(s): "CHOL", "TRIG", "HDL", "LABVLDL", "LDLCALC", "CHOLHDL" in the last 168 hours. Hematology Recent Labs  Lab 12/13/22 1132  WBC 10.8*  RBC 4.59  HGB 13.2  HCT 42.9  MCV 93.5  MCH 28.8  MCHC 30.8  RDW 14.5  PLT 187   Thyroid No results for input(s): "TSH", "FREET4" in the last 168 hours. BNPNo results for input(s): "BNP", "PROBNP" in the last 168 hours.  DDimer No results for input(s): "DDIMER" in the last 168 hours.   Radiology/Studies:  No results found.   Assessment and Plan:   Pacemaker battery depletion: Risks and benefits of pacemaker generator change out discussed.  He understands and agrees to proceed.  Due to increased risk of infection and the  potentially catastrophic consequences of infection of this pacemaker dependent patient, recommend an antibiotic pouch (Aigis). SSS: There is no atrial rhythm when atrial pacing is turned off and there is no ventricular escape rhythm. AFib: Prevalence.  On chronic anticoagulation with Eliquis.  Dose adjusted for age and renal dysfunction.  On metoprolol for episodes of RVR.   Risk Assessment/Risk Scores:         CHA2DS2-VASc Score = 6   This indicates a 9.7% annual risk of stroke. The patient's score is based upon: CHF History: 0 HTN History: 1 Diabetes History: 1 Stroke History: 2 Vascular Disease History: 0 Age Score: 2 Gender Score: 0     Code Status: Full Code  For questions or updates, please contact [facility] Please consult [email or web address] for contact info under     Signed, Thurmon Fair, MD  12/13/2022 1:47 PM

## 2022-12-13 NOTE — Op Note (Signed)
Procedure report  Procedure performed:  Dual-chamber pacemaker generator changeout  Tie Rex pouch lot number [id number], reference number C MR [id number] Reason for procedure:  1. Device generator at elective replacement interval  2.  Sinus node dysfunction Procedure performed by:  Thurmon Fair, MD  Complications:  None  Estimated blood loss:  <5 mL  Medications administered during procedure:  Ancef 2 g intravenously, , lidocaine 1% 30 mL locally  Device details:   New Generator Medtronic Azure XT DR model number W1 DR 01, serial number [id number] Right atrial lead (chronic) Medtronic, model number M834804, serial number [id number] (implanted 12/01/2003) Right ventricular lead (chronic) Medtronic, model number [id number], serial number [id number] (implanted 12/01/2003)  Explanted generator Medtronic advisa,  model number A2 DR 01, serial number [id number] (implanted 04/15/2013)  Procedure details:  After the risks and benefits of the procedure were discussed the patient provided informed consent. She was brought to the cardiac catheter lab in the fasting state. The patient was prepped and draped in usual sterile fashion. Local anesthesia with 1% lidocaine was administered to to the left infraclavicular area. A 5-6cm horizontal incision was made parallel with and 2-3 cm caudal to the left clavicle, in the area of an old scar.  Using minimal electrocautery and mostly sharp and blunt dissection the prepectoral pocket was opened carefully to avoid injury to the loops of chronic leads. Extensive dissection was not necessary. The device was explanted. The pocket was carefully inspected for hemostasis and flushed with copious amounts of antibiotic solution.  The leads were disconnected from the old generator and connected to the new generator with appropriate pacing seen.  Telemetry testing of the lead parameters showed unchanged values.  The pacemaker was placed in a tie Rex pouch.  The entire system  was then carefully inserted in the pocket with care been taking that the leads and device assumed a comfortable position without pressure on the incision. Great care was taken that the leads be located deep to the generator. The pocket was then closed in layers using 2 layers of 2-0 Vicryl, 1 layer of 3-0 Vicryl and cutaneous Steri-Strips after which a sterile dressing was applied.   At the end of the procedure the following lead parameters were encountered:   Right atrial lead sensed P waves 1.4 mV, impedance 418 ohms, threshold 0.75 at 0.4 ms pulse width.  Right ventricular lead sensed R waves 16.5 mV, impedance 494 ohms, threshold 1.25 at 0.4 ms pulse width.

## 2022-12-13 NOTE — Telephone Encounter (Signed)
Dr Royann Shivers secure chat: Mr. Briskey would like some samples of Eliquis 2.5 mg. He says he will drop by the office tomorrow.   3 boxes of samples left at the front desk

## 2022-12-13 NOTE — Discharge Instructions (Signed)

## 2022-12-13 NOTE — Telephone Encounter (Signed)
Faxed Pt assistance app for Eliquis

## 2022-12-14 ENCOUNTER — Encounter (HOSPITAL_COMMUNITY): Payer: Self-pay | Admitting: Cardiovascular Disease

## 2022-12-26 ENCOUNTER — Ambulatory Visit: Payer: Medicare HMO | Attending: Family Medicine

## 2022-12-27 ENCOUNTER — Telehealth: Payer: Self-pay | Admitting: Emergency Medicine

## 2022-12-27 NOTE — Telephone Encounter (Signed)
Called to inform this patient that Pt assistance for Eliquis was denied due to: - Pt's HH income is over current eligibility criteria - documentation of 3% oop rx expenses based on HH adjusted gross income, not met  Left call back number

## 2022-12-28 NOTE — Telephone Encounter (Signed)
This is what we give to everyone:  It's not necessarily great options, but it's all we have  Eliquis: You can apply for patient assistance through [employer] patient assistance. The website for the application is http://[email or web address]-[email or web address]/. The website can tell you if you meet the income requirement. Note to Medicare Patients: In addition to your application, you will need to submit documentation showing you have spent 3% of your annual household income on out-of-pocket prescription expenses for you and/or other members of your household for the year in which you are seeking assistance. Your pharmacy can provide this report.  Xarelto: Xarelto is a medication similar to Eliquis, but it is taken just once a day. The drug company offers a program called Xarelto with Me Coverage Gap Support. It runs from April 1-December 31. If you are in the coverage gap, you can get Xarelto for $89 (for 30 days) or $250 (for 90 days), plus sales tax if applicable. You can call to enroll at: 888-XARELTO 7805570473)  Dabigatran: Dabigatran is the generic of Pradaxa (it recently went generic). Like Eliquis and Xarelto, it does not require a lot of monitoring. You might consider contacting your insurance to see what the cost of this medication is through your insurance. There is also a GoodRx coupon for ~$69-75 per month if your insurance does not cover it well.   Warfarin: Warfarin is very inexpensive but does require more monitoring. You will need to come to clinic for weekly INR (blood test) checks with our Anticoagulation Clinic for the first month. These visits may have a copay. Then as we find a stable dose for you, you will start to come less often. You will be required to have your INR checked at least every 6 weeks. There are also more drug interactions and dietary interactions with warfarin.

## 2023-01-23 ENCOUNTER — Other Ambulatory Visit: Payer: Self-pay

## 2023-01-23 DIAGNOSIS — I480 Paroxysmal atrial fibrillation: Secondary | ICD-10-CM

## 2023-01-23 MED ORDER — APIXABAN 2.5 MG PO TABS
2.5000 mg | ORAL_TABLET | Freq: Two times a day (BID) | ORAL | 1 refills | Status: DC
Start: 2023-01-23 — End: 2023-02-15

## 2023-01-23 NOTE — Telephone Encounter (Signed)
Prescription refill request for Eliquis received. Indication: Afib  Last office visit: 05/22/22 ([facility])  Scr: 2.54 (12/13/22)  Age: 89 Weight: 95.3kg  Appropriate dose. Refill sent.

## 2023-01-30 ENCOUNTER — Other Ambulatory Visit: Payer: Self-pay | Admitting: Cardiovascular Disease

## 2023-02-05 ENCOUNTER — Ambulatory Visit (INDEPENDENT_AMBULATORY_CARE_PROVIDER_SITE_OTHER): Payer: Medicare HMO

## 2023-02-05 DIAGNOSIS — Z23 Encounter for immunization: Secondary | ICD-10-CM

## 2023-02-05 NOTE — Progress Notes (Signed)
Per orders of Dr. Caryl Never, injection of High dose flu vaccine  given by Stann Ore. Patient tolerated injection well.

## 2023-02-11 DIAGNOSIS — M25562 Pain in left knee: Secondary | ICD-10-CM | POA: Diagnosis not present

## 2023-02-15 ENCOUNTER — Telehealth: Payer: Self-pay

## 2023-02-15 ENCOUNTER — Telehealth: Payer: Self-pay | Admitting: Cardiovascular Disease

## 2023-02-15 DIAGNOSIS — I480 Paroxysmal atrial fibrillation: Secondary | ICD-10-CM

## 2023-02-15 MED ORDER — APIXABAN 2.5 MG PO TABS
2.5000 mg | ORAL_TABLET | Freq: Two times a day (BID) | ORAL | 1 refills | Status: AC
Start: 2023-02-15 — End: ?

## 2023-02-15 MED ORDER — APIXABAN 2.5 MG PO TABS: 2.5000 mg | ORAL_TABLET | Freq: Two times a day (BID) | ORAL | Status: AC

## 2023-02-15 NOTE — Telephone Encounter (Signed)
Pt c/o medication issue:  1. Name of Medication:   apixaban (ELIQUIS) 2.5 MG TABS tablet   2. How are you currently taking this medication (dosage and times per day)?   3. Are you having a reaction (difficulty breathing--STAT)?   4. What is your medication issue?   Wife stated patient has been off this medication for two weeks.  Wife stated patient may need assistance getting this medication.

## 2023-02-15 NOTE — Telephone Encounter (Signed)
I put the correct monitor in the system and showed the patient and his wife how to work his monitor. I let them know the monitor will now work by itself. He will just need to sleep by his bedside and it will work automatically.

## 2023-02-15 NOTE — Telephone Encounter (Signed)
Spoke with patient he is aware samples are ready at the front desk

## 2023-02-15 NOTE — Telephone Encounter (Signed)
Pt wife is coming for me to help her with his app today at noon.

## 2023-02-15 NOTE — Telephone Encounter (Signed)
Called and confirmed pharmacy with pt wife. They want Rx and [facility]. Asking for samples until it comes in. He as been without for 2 weeks. I will ask [facility] office to give 1 week of samples eliquis 2.5mg .

## 2023-02-15 NOTE — Telephone Encounter (Signed)
Spoke with wife and she states her husband has not had eliquis in a few weeks she need new Rx sent to pharmacy.

## 2023-03-08 DIAGNOSIS — Z192 Hormone resistant malignancy status: Secondary | ICD-10-CM | POA: Diagnosis not present

## 2023-03-08 DIAGNOSIS — C61 Malignant neoplasm of prostate: Secondary | ICD-10-CM | POA: Diagnosis not present

## 2023-03-11 DIAGNOSIS — M25562 Pain in left knee: Secondary | ICD-10-CM | POA: Diagnosis not present

## 2023-03-19 ENCOUNTER — Ambulatory Visit (INDEPENDENT_AMBULATORY_CARE_PROVIDER_SITE_OTHER): Payer: Medicare HMO

## 2023-03-19 DIAGNOSIS — I495 Sick sinus syndrome: Secondary | ICD-10-CM

## 2023-03-19 LAB — CUP PACEART REMOTE DEVICE CHECK
Battery Remaining Longevity: 138 mo
Battery Voltage: 3.19 V
Brady Statistic AP VP Percent: 9.39 %
Brady Statistic AP VS Percent: 89.31 %
Brady Statistic AS VP Percent: 0.05 %
Brady Statistic AS VS Percent: 1.25 %
Brady Statistic RA Percent Paced: 96.9 %
Brady Statistic RV Percent Paced: 10.06 %
Date Time Interrogation Session: 20241105040800
Implantable Lead Connection Status: 753985
Implantable Lead Connection Status: 753985
Implantable Lead Implant Date: 20141202
Implantable Lead Implant Date: 20141202
Implantable Lead Location: 753859
Implantable Lead Location: 753860
Implantable Lead Model: 5076
Implantable Lead Model: 5076
Implantable Pulse Generator Implant Date: 20240801
Lead Channel Impedance Value: 323 Ohm
Lead Channel Impedance Value: 323 Ohm
Lead Channel Impedance Value: 380 Ohm
Lead Channel Impedance Value: 399 Ohm
Lead Channel Pacing Threshold Amplitude: 0.625 V
Lead Channel Pacing Threshold Amplitude: 1.25 V
Lead Channel Pacing Threshold Pulse Width: 0.4 ms
Lead Channel Pacing Threshold Pulse Width: 0.4 ms
Lead Channel Sensing Intrinsic Amplitude: 18.5 mV
Lead Channel Sensing Intrinsic Amplitude: 18.5 mV
Lead Channel Sensing Intrinsic Amplitude: 2.875 mV
Lead Channel Sensing Intrinsic Amplitude: 2.875 mV
Lead Channel Setting Pacing Amplitude: 1.5 V
Lead Channel Setting Pacing Amplitude: 3 V
Lead Channel Setting Pacing Pulse Width: 0.4 ms
Lead Channel Setting Sensing Sensitivity: 0.9 mV
Zone Setting Status: 755011

## 2023-04-04 ENCOUNTER — Encounter: Payer: Self-pay | Admitting: Cardiovascular Disease

## 2023-04-04 ENCOUNTER — Ambulatory Visit: Payer: Medicare HMO | Attending: Cardiovascular Disease | Admitting: Cardiovascular Disease

## 2023-04-04 VITALS — BP 100/80 | HR 69 | Ht 71.0 in | Wt 201.8 lb

## 2023-04-04 DIAGNOSIS — G4733 Obstructive sleep apnea (adult) (pediatric): Secondary | ICD-10-CM

## 2023-04-04 DIAGNOSIS — D6869 Other thrombophilia: Secondary | ICD-10-CM | POA: Diagnosis not present

## 2023-04-04 DIAGNOSIS — I495 Sick sinus syndrome: Secondary | ICD-10-CM | POA: Diagnosis not present

## 2023-04-04 DIAGNOSIS — I10 Essential (primary) hypertension: Secondary | ICD-10-CM | POA: Diagnosis not present

## 2023-04-04 DIAGNOSIS — N184 Chronic kidney disease, stage 4 (severe): Secondary | ICD-10-CM | POA: Diagnosis not present

## 2023-04-04 DIAGNOSIS — C61 Malignant neoplasm of prostate: Secondary | ICD-10-CM

## 2023-04-04 DIAGNOSIS — I480 Paroxysmal atrial fibrillation: Secondary | ICD-10-CM | POA: Diagnosis not present

## 2023-04-04 DIAGNOSIS — Z950 Presence of cardiac pacemaker: Secondary | ICD-10-CM

## 2023-04-04 NOTE — Progress Notes (Signed)
Cardiology Office Note    Date:  04/06/2023   ID:  [name], DOB 11/01/1932, MRN [id number]  PCP:  Joseph Covey, MD  Cardiologist:  [name], M.D.;  Joseph Fair, MD   Chief Complaint  Patient presents with   Pacemaker Check    History of Present Illness:  [name] is a 89 y.o. male with sinus node dysfunction and paroxysmal atrial fibrillation, dual-chamber permanent pacemaker, returning in follow-up after pacemaker generator change out (Medtronic Azure MRI conditional dual-chamber 12/13/2022, Medtronic 5076 MRI compatible leads).  His initial device was implanted in 2014 (MRI conditional Medtronic Advisa dual-chamber pacemaker) for tachycardia-bradycardia syndrome. Additional medical problems include hyperlipidemia, hypertension, obstructive sleep apnea, chronic renal insufficiency stage IV, metastatic prostate cancer followed by Dr. Annabell French.    He is doing well and has no cardiovascular complaints today.  As before, his pacemaker records occasional episodes of atrial fibrillation, but he is not aware of these.  He does not have palpitations or syncope, but has occasional orthostatic dizziness.  He denies orthopnea, PND, lower extremity edema.  He has never complained of angina.  He has been less active recently because of problems with his left knee.  Over the years the burden of atrial fibrillation has increased slightly and over the last few months has been 4.6%.  When he is not in atrial fibrillation he has virtually 100% atrial pacing (94.2%).  He only requires ventricular pacing 24% of the time.  His device has recorded 2 episodes of "nonsustained VT" which actually represent atrial fibrillation with rapid ventricular response), but the vast majority of the time the atrial fibrillation has good ventricular rate control.  Activity level has been decreased down to about 0.3 hours/day since he had his knee problems.  Metabolic parameters are pretty  good with the most recent LDL of 71 and HDL of 48 and hemoglobin A1c of 6.3%.  His most recent creatinine was 2.2 (his baseline is usually in the 2.0-2.5 range, corresponding to a GFR of 25-30).    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/14/2013   DUAL CHAMBER PACEMAKER   Paroxysmal atrial fibrillation (HCC)    Unspecified cerebral artery occlusion with cerebral infarction    Unspecified disorder resulting from impaired renal function    Unspecified essential hypertension    Wears glasses    Wears partial dentures    upper partial    Past Surgical History:  Procedure Laterality Date   APPLICATION OF A-CELL OF EXTREMITY Right 08/12/2013   Procedure: APPLICATION OF A-CELL OF EXTREMITY;  Surgeon: Joseph Denis, DO;  Location: [facility];  Service: Plastics;  Laterality: Right;   CHOLECYSTECTOMY  1987   COLONOSCOPY     ent surgery  2004   correct snoring   INGUINAL HERNIA REPAIR     left   INSERT / REPLACE / REMOVE PACEMAKER  04/14/2013   dual chamber   / Dr Royann Shivers   MINOR IRRIGATION AND DEBRIDEMENT OF WOUND Right 03/04/2013   Procedure: MINOR IRRIGATION AND DEBRIDEMENT OF WOUND;  Surgeon: Joseph Denis, DO;  Location: [facility];  Service: Plastics;  Laterality: Right;  ORCHIECTOMY  2002   PERMANENT PACEMAKER INSERTION N/A 04/14/2013   Procedure: PERMANENT PACEMAKER INSERTION;  Surgeon: Joseph Fair, MD;  Location: [facility];  Service: Cardiovascular;  Laterality: N/A;   PPM GENERATOR CHANGEOUT N/A 12/13/2022   Procedure: PPM GENERATOR CHANGEOUT;  Surgeon: Joseph Fair, MD;  Location: [facility] INVAS[facility]  CV [facility];  Service: Cardiovascular;  Laterality: N/A;   SKIN SPLIT GRAFT Right 08/12/2013   Procedure: SKIN GRAFT SPLIT THICKNESS WITH PLACEMENT OF VAC TO RIGHT LOWER LEG/PLACEMENT OF ACELL TO RIGHT UPPER THIGH AREA (HARVEST SITE);  Surgeon: Joseph Denis, DO;  Location: [facility];  Service: Plastics;  Laterality: Right;    Current Medications: Outpatient Medications Prior to Visit  Medication Sig Dispense Refill   acetaminophen (TYLENOL) 325 MG tablet Take 2 tablets (650 mg total) by mouth every 6 (six) hours as needed. 36 tablet 0   albuterol (VENTOLIN HFA) 108 (90 Base) MCG/ACT inhaler Inhale 2 puffs into the lungs every 6 (six) hours as needed for wheezing or shortness of breath. 18 g 3   amLODipine (NORVASC) 5 MG tablet TAKE 1 TABLET EVERY DAY (NEED MD APPOINTMENT) 90 tablet 1   apixaban (ELIQUIS) 2.5 MG TABS tablet Take 1 tablet (2.5 mg total) by mouth 2 (two) times daily. 28 tablet 0   apixaban (ELIQUIS) 2.5 MG TABS tablet TAKE 1 TABLET(2.5 MG) BY MOUTH TWICE DAILY 28 tablet 0   apixaban (ELIQUIS) 2.5 MG TABS tablet Take 1 tablet (2.5 mg total) by mouth 2 (two) times daily. 28 tablet 0   apixaban (ELIQUIS) 2.5 MG TABS tablet Take 1 tablet (2.5 mg total) by mouth 2 (two) times daily. 180 tablet 1   apixaban (ELIQUIS) 2.5 MG TABS tablet Take 1 tablet (2.5 mg total) by mouth 2 (two) times daily. 28 tablet    Ascorbic Acid (VITAMIN C) 1000 MG tablet Take 1,000 mg by mouth daily.     aspirin EC 81 MG EC tablet Take 1 tablet (81 mg total) by mouth daily. 30 tablet 0   cholecalciferol (VITAMIN D3) 25 MCG (1000 UNIT) tablet Take 1,000 Units by mouth daily.     metoprolol tartrate (LOPRESSOR) 50 MG tablet TAKE 1 TABLET TWICE DAILY 180 tablet 3   Multiple Vitamin (MULTIVITAMIN WITH MINERALS) TABS tablet Take 1 tablet by mouth daily.     rosuvastatin (CRESTOR) 20 MG tablet Take 1 tablet (20 mg total) by mouth daily. 90 tablet 3   XTANDI 40 MG capsule Take 80 mg by mouth daily.     No  facility-administered medications prior to visit.     Allergies:   Patient has no known allergies.   Social History   Socioeconomic History   Marital status: Married    Spouse name: Museum/gallery exhibitions officer   Number of children: Not on file   Years of education: Not on file   Highest education level: Not on file  Occupational History   Occupation: Retired   Occupation: RETIRED    Employer: RETIRED  Tobacco Use   Smoking status: Some Days    Current packs/day: 0.50    Average packs/day: 0.5 packs/day for 30.0 years (15.0 ttl pk-yrs)    Types: Cigarettes   Smokeless tobacco: Never   Tobacco comments:     pt stated he smokes 1 pack every 2 wks    04/04/2023 Patient smoke about 3 cigarettes daily  Vaping Use   Vaping status: Never Used  Substance and Sexual Activity   Alcohol use: No   Drug use:  No   Sexual activity: Not on file    Comment: started smoking 2-3 cig daily  Other Topics Concern   Not on file  Social History Narrative   Not on file   Social Determinants of Health   Financial Resource Strain: Low Risk  (09/19/2022)   Overall Financial Resource Strain (CARDIA)    Difficulty of Paying Living Expenses: Not hard at all  Food Insecurity: No Food Insecurity (09/19/2022)   Hunger Vital Sign    Worried About Running Out of Food in the Last Year: Never true    Ran Out of Food in the Last Year: Never true  Transportation Needs: No Transportation Needs (09/19/2022)   PRAPARE - Administrator, Civil Service (Medical): No    Lack of Transportation (Non-Medical): No  Physical Activity: Insufficiently Active (09/19/2022)   Exercise Vital Sign    Days of Exercise per Week: 2 days    Minutes of Exercise per Session: 20 min  Stress: No Stress Concern Present (09/19/2022)   [employer] of Occupational Health - Occupational Stress Questionnaire    Feeling of Stress : Not at all  Social Connections: Socially Integrated (09/19/2022)   Social Connection and Isolation Panel  [NHANES]    Frequency of Communication with Friends and Family: More than three times a week    Frequency of Social Gatherings with Friends and Family: More than three times a week    Attends Religious Services: More than 4 times per year    Active Member of [employer] or Organizations: Yes    Attends Engineer, structural: More than 4 times per year    Marital Status: Married     Family History:  The patient's family history includes Cancer in his father and sister; Prostate cancer in his father.   ROS:   Please see the history of present illness.    ROS All other systems are reviewed and are negative.   PHYSICAL EXAM:   VS:  BP 100/80 (BP Location: Left Arm, Patient Position: Sitting, Cuff Size: Large)   Pulse 69   Ht 5\' 11"  (1.803 m)   Wt 201 lb 12.8 oz (91.5 kg)   SpO2 96%   BMI 28.15 kg/m    Vital signs reviewed.  Unable to examine.  Wt Readings from Last 3 Encounters:  04/04/23 201 lb 12.8 oz (91.5 kg)  12/13/22 210 lb (95.3 kg)  10/16/22 209 lb 12.8 oz (95.2 kg)     General: Alert, oriented x3, no distress, healthy left subclavian pacemaker site Head: no evidence of trauma, PERRL, EOMI, no exophtalmos or lid lag, no myxedema, no xanthelasma; normal ears, nose and oropharynx Neck: normal jugular venous pulsations and no hepatojugular reflux; brisk carotid pulses without delay and no carotid bruits Chest: clear to auscultation, no signs of consolidation by percussion or palpation, normal fremitus, symmetrical and full respiratory excursions Cardiovascular: normal position and quality of the apical impulse, regular rhythm, normal first and second heart sounds, no murmurs, rubs or gallops Abdomen: no tenderness or distention, no masses by palpation, no abnormal pulsatility or arterial bruits, normal bowel sounds, no hepatosplenomegaly Extremities: no clubbing, cyanosis or edema; 2+ radial, ulnar and brachial pulses bilaterally; 2+ right femoral, posterior tibial and  dorsalis pedis pulses; 2+ left femoral, posterior tibial and dorsalis pedis pulses; no subclavian or femoral bruits Neurological: grossly nonfocal Psych: Normal mood and affect    Studies/Labs Reviewed:   EKG:   EKG Interpretation Date/Time:  Thursday April 04 2023 10:40:22  EST Ventricular Rate:  69 PR Interval:  276 QRS Duration:  86 QT Interval:  366 QTC Calculation: 392 R Axis:   -6  Text Interpretation: Atrial-paced rhythm with prolonged AV conduction ST & T wave abnormality, consider lateral ischemia When compared with ECG of 18-Mar-2019 13:31,  There is slightly more obvious lateral ST-T changes Confirmed by Tacoya Altizer ([id number]) on 04/04/2023 11:15:11 AM         Recent Labs: 12/13/2022: BUN 30; Creatinine, Ser 2.54; Hemoglobin 13.2; Platelets 187; Potassium 4.7; Sodium 140   More recent labs from 03/08/2023 showed creatinine of 2.2, potassium 5.0, ALT 7, hemoglobin 13.8  Lipid Panel    Component Value Date/Time   CHOL 149 02/06/2022 0932   CHOL 181 05/12/2019 0945   TRIG 149.0 02/06/2022 0932   TRIG 77 04/11/2006 0906   HDL 48.00 02/06/2022 0932   HDL 42 05/12/2019 0945   CHOLHDL 3 02/06/2022 0932   VLDL 29.8 02/06/2022 0932   LDLCALC 71 02/06/2022 0932   LDLCALC 103 (H) 05/12/2019 0945   LDLDIRECT 112.0 05/17/2017 0855     ASSESSMENT:    1. PAF (paroxysmal atrial fibrillation) (HCC)   2. Acquired thrombophilia (HCC)   3. Essential hypertension   4. SSS (sick sinus syndrome) (HCC)   5. Pacemaker   6. OSA (obstructive sleep apnea)   7. Malignant neoplasm of prostate (HCC)   8. CKD (chronic kidney disease) stage 4, GFR 15-29 ml/min (HCC)       PLAN:  In order of problems listed above:  AFib: Somewhat increased prevalence of atrial fibrillation now at 4.6%, generally with good ventricular rate control, on appropriate anticoagulation. 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  (CHADSVasc 5: age 35, stroke 2, hypertension).   Anticoagulation:  Denies bleeding complications.  Dose adjusted for age and renal dysfunction. HTN: Very well-controlled, maybe even excessively so.  Consider decreasing his dose of amlodipine. SSS: Virtually 100% atrial pacing when he is not in atrial fibrillation.  Underlying rhythm is severe sinus bradycardia around 40 bpm.  Heart rate histogram distribution appears appropriate. PPM: Recent generator change.  The site has healed well.  Continue remote downloads every 3 months. OSA: Reports 100%compliance. Metastatic prostate cancer: He does not have any symptoms of metastatic bone disease and has good quality of life.. CKD 4: Creatinine seems to be stable around 2.2.  GFR 25-30.  Medication Adjustments/Labs and Tests Ordered: Current medicines are reviewed at length with the patient today.  Concerns regarding medicines are outlined above.  Medication changes, Labs and Tests ordered today are listed in the Patient Instructions below. Patient Instructions  Medication Instructions:  No changes *If you need a refill on your cardiac medications before your next appointment, please call your pharmacy*  Follow-Up: At [facility], you and your health needs are our priority.  As part of our continuing mission to provide you with exceptional heart care, we have created designated Provider Care Teams.  These Care Teams include your primary Cardiologist (physician) and Advanced Practice Providers (APPs -  Physician Assistants and Nurse Practitioners) who all work together to provide you with the care you need, when you need it.  We recommend signing up for the patient portal called "MyChart".  Sign up information is provided on this After Visit Summary.  MyChart is used to connect with patients for Virtual Visits (Telemedicine).  Patients are able to view lab/test results, encounter notes, upcoming appointments, etc.  Non-urgent messages can be sent to your provider as  well.   To learn more about what you can do  with MyChart, go to [email or web address].    Your next appointment:   1 year(s)  Provider:   Thurmon Fair, MD        Signed, Joseph Fair, MD  04/06/2023 4:58 PM    [facility] [address] [place], [place], [place]  [address] Phone: [phone]; Fax: [phone]

## 2023-04-04 NOTE — Patient Instructions (Signed)

## 2023-04-13 ENCOUNTER — Other Ambulatory Visit: Payer: Self-pay | Admitting: Family Medicine

## 2023-04-16 NOTE — Progress Notes (Signed)
Remote pacemaker transmission.   

## 2023-04-17 DIAGNOSIS — M25561 Pain in right knee: Secondary | ICD-10-CM | POA: Diagnosis not present

## 2023-05-06 ENCOUNTER — Other Ambulatory Visit: Payer: Self-pay | Admitting: Family Medicine

## 2023-05-28 ENCOUNTER — Ambulatory Visit (INDEPENDENT_AMBULATORY_CARE_PROVIDER_SITE_OTHER): Payer: Medicare HMO | Admitting: Family Medicine

## 2023-05-28 ENCOUNTER — Encounter: Payer: Self-pay | Admitting: Family Medicine

## 2023-05-28 VITALS — BP 100/74 | HR 52 | Temp 97.5°F | Ht 71.0 in | Wt 198.6 lb

## 2023-05-28 DIAGNOSIS — R059 Cough, unspecified: Secondary | ICD-10-CM

## 2023-05-28 DIAGNOSIS — I10 Essential (primary) hypertension: Secondary | ICD-10-CM | POA: Diagnosis not present

## 2023-05-28 MED ORDER — PREDNISONE 20 MG PO TABS: ORAL_TABLET | ORAL | 0 refills | Status: DC

## 2023-05-28 NOTE — Progress Notes (Signed)
 Established Patient Office Visit  Subjective   Patient ID: [name], male    DOB: 1932/07/16  Age: 89 y.o. MRN: [id number]  Chief Complaint  Patient presents with   Cough    Cough congestion     HPI   Nash seen today with chest congestion and cough really for the past couple weeks if not longer.  Onset slightly after Christmas.  They had several friends and relatives to visit during this time.  He has longstanding history of nicotine use and still smokes some.  Wife thinks she is here and some wheezing at night.  He is tried over-the-counter medication without much improvement.  No fever.  He has history of A-fib and is on Eliquis .  He has hypertension and takes amlodipine  and metoprolol .  Does have history of hyperglycemia with most recent A1c 6.3%.  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/14/2013   DUAL CHAMBER PACEMAKER   Paroxysmal atrial fibrillation (HCC)    Unspecified cerebral artery occlusion with cerebral infarction    Unspecified disorder resulting from impaired renal function    Unspecified essential hypertension    Wears glasses    Wears partial dentures    upper partial   Past Surgical History:  Procedure Laterality Date   APPLICATION OF A-CELL OF EXTREMITY Right 08/12/2013   Procedure: APPLICATION OF A-CELL OF EXTREMITY;  Surgeon: Estefana Reichert, DO;  Location: [facility];  Service: Plastics;  Laterality: Right;   CHOLECYSTECTOMY  1987   COLONOSCOPY     ent surgery  2004   correct snoring   INGUINAL HERNIA REPAIR     left   INSERT / REPLACE / REMOVE PACEMAKER  04/14/2013   dual  chamber   / Dr Francyne   MINOR IRRIGATION AND DEBRIDEMENT OF WOUND Right 03/04/2013   Procedure: MINOR IRRIGATION AND DEBRIDEMENT OF WOUND;  Surgeon: Estefana Reichert, DO;  Location: [facility];  Service: Plastics;  Laterality: Right;   ORCHIECTOMY  2002   PERMANENT PACEMAKER INSERTION N/A 04/14/2013   Procedure: PERMANENT PACEMAKER INSERTION;  Surgeon: Jerel Francyne, MD;  [facility]: [facility];  Service: Cardiovascular;  Laterality: N/A;   PPM GENERATOR CHANGEOUT N/A 12/13/2022   Procedure: PPM GENERATOR CHANGEOUT;  Surgeon: Francyne Jerel, MD;  [facility];  Service: Cardiovascular;  Laterality: N/A;   SKIN SPLIT GRAFT Right 08/12/2013   Procedure: SKIN GRAFT SPLIT THICKNESS WITH PLACEMENT OF VAC TO RIGHT LOWER LEG/PLACEMENT OF ACELL TO RIGHT UPPER THIGH AREA (HARVEST SITE);  Surgeon: Estefana Reichert, DO;  Location: [facility];  Service: Plastics;  Laterality: Right;    reports that he has been smoking cigarettes. He has a 15 pack-year smoking history. He has never used smokeless tobacco. He reports that he does not drink alcohol and does not use drugs. family history includes Cancer in his father and sister; Prostate cancer in his father. No Known Allergies  Review of Systems  Constitutional:  Negative for chills and fever.  Respiratory:  Positive for cough and wheezing. Negative for hemoptysis.   Cardiovascular:  Negative for chest pain.      Objective:  BP 100/74 (BP Location: Left Arm, Patient Position: Sitting, Cuff Size: Large)   Pulse (!) 52   Temp (!) 97.5 F (36.4 C) (Oral)   Ht 5' 11 (1.803 m)   Wt 198 lb 9.6 oz (90.1 kg)   SpO2 98%   BMI 27.70 kg/m  BP Readings from Last 3 Encounters:  05/28/23 100/74  04/04/23 100/80  12/13/22 (!) 146/106   Wt Readings from Last 3 Encounters:  05/28/23 198 lb 9.6 oz (90.1 kg)  04/04/23 201 lb 12.8 oz (91.5 kg)  12/13/22 210 lb (95.3 kg)      Physical Exam Vitals reviewed.   Constitutional:      General: He is not in acute distress.    Appearance: He is well-developed.  HENT:     Right Ear: External ear normal.     Left Ear: External ear normal.  Eyes:     Pupils: Pupils are equal, round, and reactive to light.  Neck:     Thyroid : No thyromegaly.  Cardiovascular:     Rate and Rhythm: Normal rate and regular rhythm.  Pulmonary:     Effort: Pulmonary effort is normal. No respiratory distress.     Breath sounds: Normal breath sounds. No wheezing or rales.  Musculoskeletal:     Cervical back: Neck supple.     Right lower leg: No edema.     Left lower leg: No edema.  Neurological:     Mental Status: He is alert and oriented to person, place, and time.      No results found for any visits on 05/28/23.  Last CBC Lab Results  Component Value Date   WBC 10.8 (H) 12/13/2022   HGB 13.2 12/13/2022   HCT 42.9 12/13/2022   MCV 93.5 12/13/2022   MCH 28.8 12/13/2022   RDW 14.5 12/13/2022   PLT 187 12/13/2022   Last metabolic panel Lab Results  Component Value Date   GLUCOSE 125 (H) 12/13/2022   NA 140 12/13/2022   K 4.7 12/13/2022   CL 109 12/13/2022   CO2 23 12/13/2022   BUN 30 (H) 12/13/2022   CREATININE 2.54 (H) 12/13/2022   GFRNONAA 24 (L) 12/13/2022   CALCIUM  10.5 (H) 12/13/2022   PROT 7.1 02/06/2022   ALBUMIN 3.7 02/06/2022   BILITOT 0.3 02/06/2022   ALKPHOS 132 (H) 02/06/2022   AST 16 02/06/2022   ALT 12 02/06/2022   ANIONGAP 8 12/13/2022   Last hemoglobin A1c Lab Results  Component Value Date   HGBA1C 6.3 (A) 10/16/2022      The ASCVD Risk score (Arnett DK, et al., 2019) failed to calculate for the following reasons:   The 2019 ASCVD risk score is only valid for ages 43 to 13   Risk score cannot be calculated because patient has a medical history suggesting prior/existing ASCVD    Assessment & Plan:   #87   89 year old male with recent respiratory illness.  Has had some ongoing cough and reported wheezing at night, though  none noted at this time.  His initial O2 sat was 90% but after rest 98% room air.  No rales noted.  No retractions.  We decided to cover with prednisone  20 mg 2 tablets daily for 5 days.  Follow-up immediately for any fever or worsening symptoms  #2 hypertension stable and well-controlled on amlodipine  and metoprolol   #3 history of hyperglycemia.  Recheck A1c with next lab draw.  Continue with low glycemic diet.    Wolm Scarlet, MD

## 2023-05-29 ENCOUNTER — Other Ambulatory Visit: Payer: Self-pay | Admitting: Family Medicine

## 2023-06-18 ENCOUNTER — Ambulatory Visit (INDEPENDENT_AMBULATORY_CARE_PROVIDER_SITE_OTHER): Payer: Medicare HMO

## 2023-06-18 DIAGNOSIS — I495 Sick sinus syndrome: Secondary | ICD-10-CM | POA: Diagnosis not present

## 2023-06-19 LAB — CUP PACEART REMOTE DEVICE CHECK
Battery Remaining Longevity: 140 mo
Battery Voltage: 3.15 V
Brady Statistic AP VP Percent: 16.92 %
Brady Statistic AP VS Percent: 82.14 %
Brady Statistic AS VP Percent: 0.06 %
Brady Statistic AS VS Percent: 0.89 %
Brady Statistic RA Percent Paced: 97.46 %
Brady Statistic RV Percent Paced: 17.38 %
Date Time Interrogation Session: 20250203123039
Implantable Lead Connection Status: 753985
Implantable Lead Connection Status: 753985
Implantable Lead Implant Date: 20141202
Implantable Lead Implant Date: 20141202
Implantable Lead Location: 753859
Implantable Lead Location: 753860
Implantable Lead Model: 5076
Implantable Lead Model: 5076
Implantable Pulse Generator Implant Date: 20240801
Lead Channel Impedance Value: 323 Ohm
Lead Channel Impedance Value: 323 Ohm
Lead Channel Impedance Value: 380 Ohm
Lead Channel Impedance Value: 399 Ohm
Lead Channel Pacing Threshold Amplitude: 0.625 V
Lead Channel Pacing Threshold Amplitude: 1.375 V
Lead Channel Pacing Threshold Pulse Width: 0.4 ms
Lead Channel Pacing Threshold Pulse Width: 0.4 ms
Lead Channel Sensing Intrinsic Amplitude: 18.75 mV
Lead Channel Sensing Intrinsic Amplitude: 18.75 mV
Lead Channel Sensing Intrinsic Amplitude: 2.875 mV
Lead Channel Sensing Intrinsic Amplitude: 2.875 mV
Lead Channel Setting Pacing Amplitude: 1.5 V
Lead Channel Setting Pacing Amplitude: 2.75 V
Lead Channel Setting Pacing Pulse Width: 0.4 ms
Lead Channel Setting Sensing Sensitivity: 0.9 mV
Zone Setting Status: 755011

## 2023-06-22 ENCOUNTER — Encounter: Payer: Self-pay | Admitting: Cardiovascular Disease

## 2023-06-27 ENCOUNTER — Other Ambulatory Visit: Payer: Self-pay | Admitting: Cardiovascular Disease

## 2023-07-25 NOTE — Progress Notes (Signed)
 Remote pacemaker transmission.

## 2023-07-25 NOTE — Addendum Note (Signed)
 Addended by: Geralyn Flash D on: 07/25/2023 02:10 PM   Modules accepted: Orders

## 2023-08-05 DIAGNOSIS — C7951 Secondary malignant neoplasm of bone: Secondary | ICD-10-CM | POA: Diagnosis not present

## 2023-08-05 DIAGNOSIS — C61 Malignant neoplasm of prostate: Secondary | ICD-10-CM | POA: Diagnosis not present

## 2023-08-05 DIAGNOSIS — C775 Secondary and unspecified malignant neoplasm of intrapelvic lymph nodes: Secondary | ICD-10-CM | POA: Diagnosis not present

## 2023-09-16 ENCOUNTER — Other Ambulatory Visit: Payer: Self-pay | Admitting: Cardiovascular Disease

## 2023-09-16 ENCOUNTER — Other Ambulatory Visit: Payer: Self-pay | Admitting: Family Medicine

## 2023-09-16 NOTE — Telephone Encounter (Signed)
 Copied from CRM [id number]. Topic: Clinical - Medication Refill >> Sep 16, 2023 11:15 AM Corin V wrote: Most Recent Primary Care Visit:  Provider: Marquetta Sit  Department: [facility]  Visit Type: OFFICE VISIT  Date: 05/28/2023  Medication: rosuvastatin  (CRESTOR ) 20 MG tablet   Has the patient contacted their pharmacy? Yes (Agent: If no, request that the patient contact the pharmacy for the refill. If patient does not wish to contact the pharmacy document the reason why and proceed with request.) (Agent: If yes, when and what did the pharmacy advise?)  Is this the correct pharmacy for this prescription? Yes If no, delete pharmacy and type the correct one.  This is the patient's preferred pharmacy:  [facility] - [place], [place] - [address] [place] [address] Phone: [phone] Fax: [phone]  Has the prescription been filled recently? No  Is the patient out of the medication? Yes  Has the patient been seen for an appointment in the last year OR does the patient have an upcoming appointment? Yes  Can we respond through MyChart? Yes  Agent: Please be advised that Rx refills may take up to 3 business days. We ask that you follow-up with your pharmacy.

## 2023-09-16 NOTE — Telephone Encounter (Signed)
 Prescription refill request for Eliquis  received. Indication: PAF Last office visit: 04/04/23  Melven Stable Croitoru MD Scr: 2.54 on 12/13/22  Epic Age: 89 Weight: 91.5kg  Based on above findings Eliquis  2.5mg  twice daily is the appropriate dose.  Refill approved.

## 2023-09-17 ENCOUNTER — Ambulatory Visit (INDEPENDENT_AMBULATORY_CARE_PROVIDER_SITE_OTHER): Payer: Medicare HMO

## 2023-09-17 DIAGNOSIS — I495 Sick sinus syndrome: Secondary | ICD-10-CM | POA: Diagnosis not present

## 2023-09-17 LAB — CUP PACEART REMOTE DEVICE CHECK
Battery Remaining Longevity: 136 mo
Battery Voltage: 3.08 V
Brady Statistic AP VP Percent: 16.89 %
Brady Statistic AP VS Percent: 82.4 %
Brady Statistic AS VP Percent: 0.08 %
Brady Statistic AS VS Percent: 0.64 %
Brady Statistic RA Percent Paced: 97.22 %
Brady Statistic RV Percent Paced: 17.74 %
Date Time Interrogation Session: 20250506050117
Implantable Lead Connection Status: 753985
Implantable Lead Connection Status: 753985
Implantable Lead Implant Date: 20141202
Implantable Lead Implant Date: 20141202
Implantable Lead Location: 753859
Implantable Lead Location: 753860
Implantable Lead Model: 5076
Implantable Lead Model: 5076
Implantable Pulse Generator Implant Date: 20240801
Lead Channel Impedance Value: 304 Ohm
Lead Channel Impedance Value: 323 Ohm
Lead Channel Impedance Value: 380 Ohm
Lead Channel Impedance Value: 399 Ohm
Lead Channel Pacing Threshold Amplitude: 0.625 V
Lead Channel Pacing Threshold Amplitude: 1.125 V
Lead Channel Pacing Threshold Pulse Width: 0.4 ms
Lead Channel Pacing Threshold Pulse Width: 0.4 ms
Lead Channel Sensing Intrinsic Amplitude: 1.75 mV
Lead Channel Sensing Intrinsic Amplitude: 1.75 mV
Lead Channel Sensing Intrinsic Amplitude: 15.875 mV
Lead Channel Sensing Intrinsic Amplitude: 15.875 mV
Lead Channel Setting Pacing Amplitude: 1.5 V
Lead Channel Setting Pacing Amplitude: 3 V
Lead Channel Setting Pacing Pulse Width: 0.4 ms
Lead Channel Setting Sensing Sensitivity: 0.9 mV
Zone Setting Status: 755011

## 2023-09-22 ENCOUNTER — Encounter: Payer: Self-pay | Admitting: Cardiovascular Disease

## 2023-09-27 ENCOUNTER — Ambulatory Visit: Payer: Medicare HMO

## 2023-09-27 VITALS — BP 120/64 | HR 72 | Temp 98.4°F | Ht 71.0 in | Wt 211.3 lb

## 2023-09-27 DIAGNOSIS — Z0000 Encounter for general adult medical examination without abnormal findings: Secondary | ICD-10-CM | POA: Diagnosis not present

## 2023-09-27 NOTE — Progress Notes (Addendum)
 Subjective:   [name] is a 89 y.o. who presents for a Medicare Wellness preventive visit.  As a reminder, Annual Wellness Visits don't include a physical exam, and some assessments may be limited, especially if this visit is performed virtually. We may recommend an in-person follow-up visit with your provider if needed.  Visit Complete: In person    Persons Participating in Visit: Patient.  AWV Questionnaire: No: Patient Medicare AWV questionnaire was not completed prior to this visit.  Cardiac Risk Factors include: advanced age (>88men, >61 women);male gender;hypertension     Objective:     Today's Vitals   09/27/23 1254  BP: 120/64  Pulse: 72  Temp: 98.4 F (36.9 C)  TempSrc: Oral  SpO2: 98%  Weight: 211 lb 4.8 oz (95.8 kg)  Height: 5\' 11"  (1.803 m)   Body mass index is 29.47 kg/m.     09/27/2023    1:22 PM 12/13/2022   11:34 AM 09/19/2022   12:48 PM 03/14/2022   11:20 AM 09/12/2021   11:15 AM 04/13/2021    7:02 PM 09/06/2020   11:08 AM  Advanced Directives  Does Patient Have a Medical Advance Directive? Yes Yes Yes Yes Yes No Yes  Type of Estate agent of [place];Living will Living will;Healthcare Power of [employer] Power of [place];Living will Healthcare Power of [place];Living will Healthcare Power of [place];Living will  Living will  Does patient want to make changes to medical advance directive?  No - Guardian declined  No - Patient declined     Copy of Healthcare Power of Attorney in Chart? No - copy requested No - copy requested No - copy requested No - copy requested No - copy requested    Would patient like information on creating a medical advance directive?      No - Patient declined     Current Medications (verified) Outpatient Encounter Medications as of 09/27/2023  Medication Sig   acetaminophen  (TYLENOL ) 325 MG tablet Take 2 tablets (650 mg total) by mouth every 6 (six) hours as needed.   albuterol  (VENTOLIN   HFA) 108 (90 Base) MCG/ACT inhaler Inhale 2 puffs into the lungs every 6 (six) hours as needed for wheezing or shortness of breath.   amLODipine  (NORVASC ) 5 MG tablet TAKE 1 TABLET EVERY DAY (NEED MD APPOINTMENT)   apixaban  (ELIQUIS ) 2.5 MG TABS tablet Take 1 tablet (2.5 mg total) by mouth 2 (two) times daily.   apixaban  (ELIQUIS ) 2.5 MG TABS tablet TAKE 1 TABLET(2.5 MG) BY MOUTH TWICE DAILY   apixaban  (ELIQUIS ) 2.5 MG TABS tablet Take 1 tablet (2.5 mg total) by mouth 2 (two) times daily.   apixaban  (ELIQUIS ) 2.5 MG TABS tablet Take 1 tablet (2.5 mg total) by mouth 2 (two) times daily.   apixaban  (ELIQUIS ) 2.5 MG TABS tablet Take 1 tablet (2.5 mg total) by mouth 2 (two) times daily.   apixaban  (ELIQUIS ) 2.5 MG TABS tablet TAKE 1 TABLET TWICE DAILY   Ascorbic Acid (VITAMIN C) 1000 MG tablet Take 1,000 mg by mouth daily.   aspirin  EC 81 MG EC tablet Take 1 tablet (81 mg total) by mouth daily.   cholecalciferol (VITAMIN D3) 25 MCG (1000 UNIT) tablet Take 1,000 Units by mouth daily.   metoprolol  tartrate (LOPRESSOR ) 50 MG tablet TAKE 1 TABLET TWICE DAILY   Multiple Vitamin (MULTIVITAMIN WITH MINERALS) TABS tablet Take 1 tablet by mouth daily.   predniSONE  (DELTASONE ) 20 MG tablet Take two tablets by mouth once daily for 5 days.  rosuvastatin  (CRESTOR ) 20 MG tablet TAKE 1 TABLET EVERY DAY   XTANDI 40 MG capsule Take 80 mg by mouth daily.   No facility-administered encounter medications on file as of 09/27/2023.    Allergies (verified) Patient has no known allergies.   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/14/2013   DUAL CHAMBER  PACEMAKER   Paroxysmal atrial fibrillation (HCC)    Unspecified cerebral artery occlusion with cerebral infarction    Unspecified disorder resulting from impaired renal function    Unspecified essential hypertension    Wears glasses    Wears partial dentures    upper partial   Past Surgical History:  Procedure Laterality Date   APPLICATION OF A-CELL OF EXTREMITY Right 08/12/2013   Procedure: APPLICATION OF A-CELL OF EXTREMITY;  Surgeon: Marilou Showman, DO;  Location: [facility];  Service: Plastics;  Laterality: Right;   CHOLECYSTECTOMY  1987   COLONOSCOPY     ent surgery  2004   correct snoring   INGUINAL HERNIA REPAIR     left   INSERT / REPLACE / REMOVE PACEMAKER  04/14/2013   dual chamber   / Dr Alvis Ba   MINOR IRRIGATION AND DEBRIDEMENT OF WOUND Right 03/04/2013   Procedure: MINOR IRRIGATION AND DEBRIDEMENT OF WOUND;  Surgeon: Marilou Showman, DO;  Location: [facility];  Service: Plastics;  Laterality: Right;   ORCHIECTOMY  2002   PERMANENT PACEMAKER INSERTION N/A 04/14/2013   Procedure: PERMANENT PACEMAKER INSERTION;  Surgeon: Luana Rumple, MD;  [facility]: [facility];  Service: Cardiovascular;  Laterality: N/A;   PPM GENERATOR CHANGEOUT N/A 12/13/2022   Procedure: PPM GENERATOR CHANGEOUT;  Surgeon: Luana Rumple, MD;  [facility];  Service: Cardiovascular;  Laterality: N/A;   SKIN SPLIT GRAFT Right 08/12/2013   Procedure: SKIN GRAFT SPLIT THICKNESS WITH PLACEMENT OF VAC TO RIGHT LOWER LEG/PLACEMENT OF ACELL TO RIGHT UPPER THIGH AREA (HARVEST SITE);  Surgeon: Marilou Showman, DO;  Location: [facility];  Service: Plastics;  Laterality: Right;   Family History  Problem Relation Age of Onset   Prostate cancer Father    Cancer Father        prostate cancer   Cancer Sister        ovarian cancer   Social History   Socioeconomic History   Marital status: Married    Spouse name: Museum/gallery exhibitions officer   Number of children: Not on  file   Years of education: Not on file   Highest education level: Not on file  Occupational History   Occupation: Retired   Occupation: RETIRED    Employer: RETIRED  Tobacco Use   Smoking status: Some Days    Current packs/day: 0.50    Average packs/day: 0.5 packs/day for 30.0 years (15.0 ttl pk-yrs)    Types: Cigarettes   Smokeless tobacco: Never   Tobacco comments:     pt stated he smokes 1 pack every 2 wks    04/04/2023 Patient smoke about 3 cigarettes daily  Vaping Use   Vaping status: Never Used  Substance and Sexual Activity   Alcohol use: No   Drug use: No   Sexual activity:  Not on file    Comment: started smoking 2-3 cig daily  Other Topics Concern   Not on file  Social History Narrative   Not on file   Social Drivers of Health   Financial Resource Strain: Low Risk  (09/27/2023)   Overall Financial Resource Strain (CARDIA)    Difficulty of Paying Living Expenses: Not hard at all  Food Insecurity: No Food Insecurity (09/27/2023)   Hunger Vital Sign    Worried About Running Out of Food in the Last Year: Never true    Ran Out of Food in the Last Year: Never true  Transportation Needs: No Transportation Needs (09/27/2023)   PRAPARE - Administrator, Civil Service (Medical): No    Lack of Transportation (Non-Medical): No  Physical Activity: Insufficiently Active (09/27/2023)   Exercise Vital Sign    Days of Exercise per Week: 2 days    Minutes of Exercise per Session: 30 min  Stress: No Stress Concern Present (09/27/2023)   [employer] of Occupational Health - Occupational Stress Questionnaire    Feeling of Stress : Not at all  Social Connections: Socially Integrated (09/27/2023)   Social Connection and Isolation Panel [NHANES]    Frequency of Communication with Friends and Family: More than three times a week    Frequency of Social Gatherings with Friends and Family: More than three times a week    Attends Religious Services: More than 4 times per  year    Active Member of [employer] or Organizations: Yes    Attends Engineer, structural: More than 4 times per year    Marital Status: Married    Tobacco Counseling Ready to quit: Yes Counseling given: Yes Tobacco comments:  pt stated he smokes 1 pack every 2 wks 04/04/2023 Patient smoke about 3 cigarettes daily    Clinical Intake:  Pre-visit preparation completed: Yes  Pain : No/denies pain     BMI - recorded: 29.47 Nutritional Status: BMI 25 -29 Overweight Nutritional Risks: None Diabetes: No  Lab Results  Component Value Date   HGBA1C 6.3 (A) 10/16/2022   HGBA1C 6.6 (H) 02/06/2022   HGBA1C 6.4 10/03/2020     How often do you need to have someone help you when you read instructions, pamphlets, or other written materials from your doctor or pharmacy?: 1 - Never  Interpreter Needed?: No  Information entered by :: Farris Hong LPN   Activities of Daily Living     09/27/2023    1:21 PM  In your present state of health, do you have any difficulty performing the following activities:  Hearing? 0  Vision? 0  Difficulty concentrating or making decisions? 0  Walking or climbing stairs? 0  Dressing or bathing? 0  Doing errands, shopping? 0  Preparing Food and eating ? N  Using the Toilet? N  In the past six months, have you accidently leaked urine? N  Do you have problems with loss of bowel control? N  Managing your Medications? N  Managing your Finances? N  Housekeeping or managing your Housekeeping? N    Patient Care Team: Marquetta Sit, MD as PCP - General (Family Medicine) Croitoru, Karyl Paget, MD as PCP - Cardiology (Cardiology) Alver Austin, [facility] (Inactive) as Pharmacist (Pharmacist)  Indicate any recent Medical Services you may have received from other than [facility] providers in the past year (date may be approximate).     Assessment:    This is a routine wellness examination for [name].  Hearing/Vision screen  Hearing Screening -  Comments:: Denies hearing difficulties   Vision Screening - Comments:: Wears rx glasses - up to date with routine eye exams with  [facility]   Goals Addressed               This Visit's Progress     Increase physical activity (pt-stated)        Remain active.       Depression Screen     09/27/2023   12:56 PM 05/28/2023    2:17 PM 09/19/2022   12:46 PM 02/14/2022    3:27 PM 02/06/2022    9:08 AM 09/12/2021   11:17 AM 09/06/2020   11:07 AM  PHQ 2/9 Scores  PHQ - 2 Score 0 0 0 1 0 0 0  PHQ- 9 Score  3  2 1 1      Fall Risk     09/27/2023    1:21 PM 05/28/2023    2:17 PM 09/19/2022   12:47 PM 02/14/2022    3:27 PM 02/06/2022    9:09 AM  Fall Risk   Falls in the past year? 0 0 0 0 0  Number falls in past yr: 0 0 0 0 0  Injury with Fall? 0 0 0 0 0  Risk for fall due to : No Fall Risks No Fall Risks No Fall Risks No Fall Risks No Fall Risks  Follow up Falls prevention discussed;Falls evaluation completed Falls evaluation completed Falls prevention discussed Falls evaluation completed Falls evaluation completed    MEDICARE RISK AT HOME:  Medicare Risk at Home Any stairs in or around the home?: Yes If so, are there any without handrails?: No Home free of loose throw rugs in walkways, pet beds, electrical cords, etc?: Yes Adequate lighting in your home to reduce risk of falls?: Yes Life alert?: No Use of a cane, walker or w/c?: No Grab bars in the bathroom?: No Shower chair or bench in shower?: No Elevated toilet seat or a handicapped toilet?: Yes  TIMED UP AND GO:  Was the test performed?  Yes  Gait slow steady without the use of device.  Cognitive Function: 6CIT completed        09/27/2023    1:22 PM 09/19/2022   12:48 PM 09/12/2021   11:18 AM 09/06/2020   11:12 AM  6CIT Screen  What Year? 0 points 0 points 0 points 0 points  What month? 0 points 0 points 0 points 0 points  What time? 0 points 0 points 0 points   Count back from 20 0 points 0 points 0 points 0  points  Months in reverse 0 points 0 points 0 points 0 points  Repeat phrase 0 points 0 points 6 points 10 points  Total Score 0 points 0 points 6 points     Immunizations Immunization History  Administered Date(s) Administered   Fluad Quad(high Dose 65+) 02/24/2019, 02/06/2022   Fluad Trivalent(High Dose 65+) 02/05/2023   Influenza Split 03/22/2011, 04/01/2012   Influenza Whole 02/18/2009, 04/13/2010   Influenza, High Dose Seasonal PF 05/10/2014, 05/17/2015, 03/28/2016, 03/20/2017, 02/17/2018   Influenza,inj,Quad PF,6+ Mos 02/16/2013   PFIZER(Purple Top)SARS-COV-2 Vaccination 06/27/2019, 07/22/2019, 03/13/2020, 10/02/2020   Pneumococcal Conjugate-13 11/11/2013   Pneumococcal Polysaccharide-23 05/18/2016   Tdap 11/22/2011, 04/13/2021    Screening Tests Health Maintenance  Topic Date Due   Zoster Vaccines- Shingrix (1 of 2) Never done   COVID-19 Vaccine (5 - 2024-25 season) 01/13/2023   INFLUENZA VACCINE  12/13/2023   Medicare Annual  Wellness (AWV)  09/26/2024   DTaP/Tdap/Td (3 - Td or Tdap) 04/14/2031   Pneumonia Vaccine 19+ Years old  Completed   HPV VACCINES  Aged Out   Meningococcal B Vaccine  Aged Out    Health Maintenance  Health Maintenance Due  Topic Date Due   Zoster Vaccines- Shingrix (1 of 2) Never done   COVID-19 Vaccine (5 - 2024-25 season) 01/13/2023   Health Maintenance Items Addressed:   Additional Screening:  Vision Screening: Recommended annual ophthalmology exams for early detection of glaucoma and other disorders of the eye.  Dental Screening: Recommended annual dental exams for proper oral hygiene  Community Resource Referral / Chronic Care Management: CRR required this visit?  No   CCM required this visit?  No   Plan:    I have personally reviewed and noted the following in the patient's chart:   Medical and social history Use of alcohol, tobacco or illicit drugs  Current medications and supplements including opioid prescriptions.  Patient is not currently taking opioid prescriptions. Functional ability and status Nutritional status Physical activity Advanced directives List of other physicians Hospitalizations, surgeries, and ER visits in previous 12 months Vitals Screenings to include cognitive, depression, and falls Referrals and appointments  In addition, I have reviewed and discussed with patient certain preventive protocols, quality metrics, and best practice recommendations. A written personalized care plan for preventive services as well as general preventive health recommendations were provided to patient.   Joseph Ford, LPN   08/20/8117   After Visit Summary: (In Person-Printed) AVS printed and given to the patient  Notes: Nothing significant to report at this time.

## 2023-09-27 NOTE — Patient Instructions (Addendum)
 [name] , Thank you for taking time out of your busy schedule to complete your Annual Wellness Visit with me. I enjoyed our conversation and look forward to speaking with you again next year. I, as well as your care team,  appreciate your ongoing commitment to your health goals. Please review the following plan we discussed and let me know if I can assist you in the future. Your Game plan/ To Do List    Referrals: If you haven't heard from the office you've been referred to, please reach out to them at the phone provided.   Follow up Visits: Next Medicare AWV with our clinical staff: 09/12/24 @ 1P   Have you seen your provider in the last 6 months (3 months if uncontrolled diabetes)? Yes 05/28/23 Next Office Visit with your provider:   Clinician Recommendations:  Aim for 30 minutes of exercise or brisk walking, 6-8 glasses of water, and 5 servings of fruits and vegetables each day.       This is a list of the screening recommended for you and due dates:  Health Maintenance  Topic Date Due   Zoster (Shingles) Vaccine (1 of 2) Never done   COVID-19 Vaccine (5 - 2024-25 season) 01/13/2023   Flu Shot  12/13/2023   Medicare Annual Wellness Visit  09/26/2024   DTaP/Tdap/Td vaccine (3 - Td or Tdap) 04/14/2031   Pneumonia Vaccine  Completed   HPV Vaccine  Aged Out   Meningitis B Vaccine  Aged Out    Advanced directives: (Copy Requested) Please bring a copy of your health care power of attorney and living will to the office to be added to your chart at your convenience. You can mail to [facility] [address] [place], [place] [address] or email to [email or web address] Advance Care Planning is important because it:  [x]  Makes sure you receive the medical care that is consistent with your values, goals, and preferences  [x]  It provides guidance to your family and loved ones and reduces their decisional burden about whether or not they are making the right decisions  based on your wishes.  Follow the link provided in your after visit summary or read over the paperwork we have mailed to you to help you started getting your Advance Directives in place. If you need assistance in completing these, please reach out to us  so that we can help you!  See attachments for Preventive Care and Fall Prevention Tips.

## 2023-09-30 ENCOUNTER — Other Ambulatory Visit: Payer: Self-pay

## 2023-09-30 ENCOUNTER — Encounter (HOSPITAL_BASED_OUTPATIENT_CLINIC_OR_DEPARTMENT_OTHER): Payer: Self-pay | Admitting: Emergency Medicine

## 2023-09-30 ENCOUNTER — Emergency Department (HOSPITAL_BASED_OUTPATIENT_CLINIC_OR_DEPARTMENT_OTHER): Admission: EM | Admit: 2023-09-30 | Discharge: 2023-09-30 | Disposition: A | Discharge status: Home / Self Care

## 2023-09-30 ENCOUNTER — Emergency Department (HOSPITAL_BASED_OUTPATIENT_CLINIC_OR_DEPARTMENT_OTHER)

## 2023-09-30 DIAGNOSIS — Z79899 Other long term (current) drug therapy: Secondary | ICD-10-CM | POA: Insufficient documentation

## 2023-09-30 DIAGNOSIS — C61 Malignant neoplasm of prostate: Secondary | ICD-10-CM | POA: Diagnosis not present

## 2023-09-30 DIAGNOSIS — I10 Essential (primary) hypertension: Secondary | ICD-10-CM | POA: Insufficient documentation

## 2023-09-30 DIAGNOSIS — K4090 Unilateral inguinal hernia, without obstruction or gangrene, not specified as recurrent: Secondary | ICD-10-CM | POA: Diagnosis not present

## 2023-09-30 DIAGNOSIS — R1032 Left lower quadrant pain: Secondary | ICD-10-CM | POA: Diagnosis not present

## 2023-09-30 DIAGNOSIS — Z7982 Long term (current) use of aspirin: Secondary | ICD-10-CM | POA: Insufficient documentation

## 2023-09-30 DIAGNOSIS — Z7901 Long term (current) use of anticoagulants: Secondary | ICD-10-CM | POA: Diagnosis not present

## 2023-09-30 DIAGNOSIS — R109 Unspecified abdominal pain: Secondary | ICD-10-CM

## 2023-09-30 DIAGNOSIS — N281 Cyst of kidney, acquired: Secondary | ICD-10-CM | POA: Diagnosis not present

## 2023-09-30 DIAGNOSIS — R030 Elevated blood-pressure reading, without diagnosis of hypertension: Secondary | ICD-10-CM

## 2023-09-30 LAB — URINALYSIS, ROUTINE W REFLEX MICROSCOPIC
Bacteria, UA: NONE SEEN
Bilirubin Urine: NEGATIVE
Glucose, UA: NEGATIVE mg/dL
Hgb urine dipstick: NEGATIVE
Ketones, ur: NEGATIVE mg/dL
Leukocytes,Ua: NEGATIVE
Nitrite: NEGATIVE
Protein, ur: 30 mg/dL — AB
Specific Gravity, Urine: 1.017 (ref 1.005–1.030)
pH: 5.5 (ref 5.0–8.0)

## 2023-09-30 LAB — COMPREHENSIVE METABOLIC PANEL WITH GFR
ALT: 12 U/L (ref 0–44)
AST: 20 U/L (ref 15–41)
Albumin: 4.1 g/dL (ref 3.5–5.0)
Alkaline Phosphatase: 118 U/L (ref 38–126)
Anion gap: 11 (ref 5–15)
BUN: 23 mg/dL (ref 8–23)
CO2: 24 mmol/L (ref 22–32)
Calcium: 10.8 mg/dL — ABNORMAL HIGH (ref 8.9–10.3)
Chloride: 109 mmol/L (ref 98–111)
Creatinine, Ser: 2.17 mg/dL — ABNORMAL HIGH (ref 0.61–1.24)
GFR, Estimated: 29 mL/min — ABNORMAL LOW (ref >60)
Glucose, Bld: 118 mg/dL — ABNORMAL HIGH (ref 70–99)
Potassium: 4.4 mmol/L (ref 3.5–5.1)
Sodium: 144 mmol/L (ref 135–145)
Total Bilirubin: 0.3 mg/dL (ref 0.0–1.2)
Total Protein: 7.6 g/dL (ref 6.5–8.1)

## 2023-09-30 LAB — CBC WITH DIFFERENTIAL/PLATELET
Abs Immature Granulocytes: 0.02 10*3/uL (ref 0.00–0.07)
Basophils Absolute: 0 10*3/uL (ref 0.0–0.1)
Basophils Relative: 0 %
Eosinophils Absolute: 0.4 10*3/uL (ref 0.0–0.5)
Eosinophils Relative: 4 %
HCT: 41.6 % (ref 39.0–52.0)
Hemoglobin: 13.2 g/dL (ref 13.0–17.0)
Immature Granulocytes: 0 %
Lymphocytes Relative: 24 %
Lymphs Abs: 2.4 10*3/uL (ref 0.7–4.0)
MCH: 29.7 pg (ref 26.0–34.0)
MCHC: 31.7 g/dL (ref 30.0–36.0)
MCV: 93.5 fL (ref 80.0–100.0)
Monocytes Absolute: 0.7 10*3/uL (ref 0.1–1.0)
Monocytes Relative: 8 %
Neutro Abs: 6.3 10*3/uL (ref 1.7–7.7)
Neutrophils Relative %: 64 %
Platelets: 181 10*3/uL (ref 150–400)
RBC: 4.45 MIL/uL (ref 4.22–5.81)
RDW: 14.5 % (ref 11.5–15.5)
WBC: 9.8 10*3/uL (ref 4.0–10.5)
nRBC: 0 % (ref 0.0–0.2)

## 2023-09-30 LAB — LIPASE, BLOOD: Lipase: 49 U/L (ref 11–51)

## 2023-09-30 MED ORDER — ONDANSETRON HCL 4 MG/2ML IJ SOLN
4.0000 mg | Freq: Once | INTRAMUSCULAR | Status: AC
  Administered 2023-09-30: 4 mg via INTRAVENOUS
  Filled 2023-09-30: qty 2

## 2023-09-30 MED ORDER — MORPHINE SULFATE (PF) 2 MG/ML IV SOLN
2.0000 mg | Freq: Once | INTRAVENOUS | Status: AC
  Administered 2023-09-30: 2 mg via INTRAVENOUS
  Filled 2023-09-30: qty 1

## 2023-09-30 MED ORDER — DICLOFENAC SODIUM 1 % EX GEL: 2.0000 g | Freq: Four times a day (QID) | CUTANEOUS | 0 refills | Status: DC

## 2023-09-30 MED ORDER — AMLODIPINE BESYLATE 5 MG PO TABS
5.0000 mg | ORAL_TABLET | Freq: Once | ORAL | Status: AC
  Administered 2023-09-30: 5 mg via ORAL
  Filled 2023-09-30: qty 1

## 2023-09-30 MED ORDER — AMLODIPINE BESYLATE 10 MG PO TABS: 10.0000 mg | ORAL_TABLET | Freq: Every day | ORAL | 0 refills | Status: DC

## 2023-09-30 MED ORDER — LIDOCAINE 5 % EX PTCH: 1.0000 | MEDICATED_PATCH | CUTANEOUS | 0 refills | Status: DC

## 2023-09-30 MED ORDER — LIDOCAINE 5 % EX PTCH
1.0000 | MEDICATED_PATCH | CUTANEOUS | Status: DC
  Administered 2023-09-30: 1 via TRANSDERMAL
  Filled 2023-09-30: qty 1

## 2023-09-30 NOTE — Discharge Instructions (Addendum)
 Your workup today was reassuring.  If your blood pressure remains elevated when you check it tonight is okay to take your 5 mg evening dose of amlodipine .  If this is still elevated tomorrow night it is okay to start the 10 mg tablets of amlodipine  instead of the 5.  Please continue the metoprolol  as prescribed.  Try the lidocaine  patches as well as the Voltaren  gel for pain.  You may also take Tylenol .  Follow-up with your doctor for reevaluation and return to the ER for worsening symptoms.

## 2023-09-30 NOTE — ED Triage Notes (Signed)
 C/o left flank pain x 2 days. Denies hx of kidney stones .Denies urinary symptoms.

## 2023-09-30 NOTE — ED Notes (Signed)
 Patient transported to CT

## 2023-09-30 NOTE — ED Provider Notes (Signed)
 [place] EMERGENCY DEPARTMENT AT [facility] Provider Note   CSN: [id number] Arrival date & time: 09/30/23  1114     History  Chief Complaint  Patient presents with   Flank Pain    [name] is a 89 y.o. male.  89 year old male with past medical history of prostate cancer who is currently on oral chemotherapy as well as hypertension presenting to the emergency department today with left-sided flank pain.  The patient states that this being on now for the past 48 hours.  He reports that this was intermittent in character during the first 24 hours or so.  Reports that it has been relatively constant since he tried to go to bed last night.  The patient denies any associated nausea or vomiting.  Denies any fevers.  He came to the ER today for further evaluation due to these ongoing symptoms.  He denies any focal weakness, numbness, or tingling.   Flank Pain       Home Medications Prior to Admission medications   Medication Sig Start Date End Date Taking? Authorizing Provider  amLODipine  (NORVASC ) 10 MG tablet Take 1 tablet (10 mg total) by mouth daily. 09/30/23  Yes Carin Charleston, MD  diclofenac  Sodium (VOLTAREN ) 1 % GEL Apply 2 g topically 4 (four) times daily. 09/30/23  Yes Carin Charleston, MD  lidocaine  (LIDODERM ) 5 % Place 1 patch onto the skin daily. Remove & Discard patch within 12 hours or as directed by MD 09/30/23  Yes Carin Charleston, MD  acetaminophen  (TYLENOL ) 325 MG tablet Take 2 tablets (650 mg total) by mouth every 6 (six) hours as needed. 04/13/21   Teddi Favors, DO  albuterol  (VENTOLIN  HFA) 108 (90 Base) MCG/ACT inhaler Inhale 2 puffs into the lungs every 6 (six) hours as needed for wheezing or shortness of breath. 03/10/21   Burchette, Marijean Shouts, MD  apixaban  (ELIQUIS ) 2.5 MG TABS tablet Take 1 tablet (2.5 mg total) by mouth 2 (two) times daily. 09/10/22   Croitoru, Mihai, MD  apixaban  (ELIQUIS ) 2.5 MG TABS tablet TAKE 1 TABLET(2.5 MG) BY MOUTH TWICE DAILY  10/16/22   Lutgen, Megan E, RPH-CPP  apixaban  (ELIQUIS ) 2.5 MG TABS tablet Take 1 tablet (2.5 mg total) by mouth 2 (two) times daily. 10/24/22   Pavero, Veryl Gottron, RPH  apixaban  (ELIQUIS ) 2.5 MG TABS tablet Take 1 tablet (2.5 mg total) by mouth 2 (two) times daily. 02/15/23   Croitoru, Mihai, MD  apixaban  (ELIQUIS ) 2.5 MG TABS tablet Take 1 tablet (2.5 mg total) by mouth 2 (two) times daily. 02/15/23   Croitoru, Mihai, MD  apixaban  (ELIQUIS ) 2.5 MG TABS tablet TAKE 1 TABLET TWICE DAILY 09/16/23   Croitoru, Mihai, MD  Ascorbic Acid (VITAMIN C) 1000 MG tablet Take 1,000 mg by mouth daily.    [provider]  aspirin  EC 81 MG EC tablet Take 1 tablet (81 mg total) by mouth daily. 03/20/19   Vann, Jessica U, DO  cholecalciferol (VITAMIN D3) 25 MCG (1000 UNIT) tablet Take 1,000 Units by mouth daily.    [provider]  metoprolol  tartrate (LOPRESSOR ) 50 MG tablet TAKE 1 TABLET TWICE DAILY 06/27/23   Croitoru, Mihai, MD  Multiple Vitamin (MULTIVITAMIN WITH MINERALS) TABS tablet Take 1 tablet by mouth daily.    [provider]  predniSONE  (DELTASONE ) 20 MG tablet Take two tablets by mouth once daily for 5 days. 05/28/23   Burchette, Marijean Shouts, MD  rosuvastatin  (CRESTOR ) 20 MG tablet TAKE 1 TABLET EVERY  DAY 05/07/23   Burchette, Marijean Shouts, MD  XTANDI 40 MG capsule Take 80 mg by mouth daily. 11/13/17   [provider]      Allergies    Patient has no known allergies.    Review of Systems   Review of Systems  Genitourinary:  Positive for flank pain.  All other systems reviewed and are negative.   Physical Exam Updated Vital Signs BP (!) 193/99   Pulse 69   Temp 97.9 F (36.6 C) (Oral)   Resp 14   SpO2 94%  Physical Exam Vitals and nursing note reviewed.   Gen: NAD Eyes: PERRL, EOMI HEENT: no oropharyngeal swelling Neck: trachea midline Resp: clear to auscultation bilaterally Card: RRR, no murmurs, rubs, or gallops Abd: Mildly distended, the patient is tender over  the left CVA Extremities: no calf tenderness, no edema Vascular: 2+ radial pulses bilaterally, 2+ DP pulses bilaterally Skin: no rashes Psyc: acting appropriately   ED Results / Procedures / Treatments   Labs (all labs ordered are listed, but only abnormal results are displayed) Labs Reviewed  COMPREHENSIVE METABOLIC PANEL WITH GFR - Abnormal; Notable for the following components:      Result Value   Glucose, Bld 118 (*)    Creatinine, Ser 2.17 (*)    Calcium  10.8 (*)    GFR, Estimated 29 (*)    All other components within normal limits  URINALYSIS, ROUTINE W REFLEX MICROSCOPIC - Abnormal; Notable for the following components:   Protein, ur 30 (*)    All other components within normal limits  CBC WITH DIFFERENTIAL/PLATELET  LIPASE, BLOOD    EKG EKG Interpretation Date/Time:  Monday Sep 30 2023 14:50:27 EDT Ventricular Rate:  80 PR Interval:  173 QRS Duration:  161 QT Interval:  454 QTC Calculation: 524 R Axis:   -75  Text Interpretation: ATRIAL PACED RHYTHM Lateral leads are also involved Confirmed by Abner Hoffman [phone]) on 09/30/2023 3:39:53 PM  Radiology CT Renal Stone Study Result Date: 09/30/2023 CLINICAL DATA:  Left flank pain. EXAM: CT ABDOMEN AND PELVIS WITHOUT CONTRAST TECHNIQUE: Multidetector CT imaging of the abdomen and pelvis was performed following the standard protocol without IV contrast. RADIATION DOSE REDUCTION: This exam was performed according to the departmental dose-optimization program which includes automated exposure control, adjustment of the mA and/or kV according to patient size and/or use of iterative reconstruction technique. COMPARISON:  CT abdomen pelvis dated 03/23/2015. FINDINGS: Evaluation of this exam is limited in the absence of intravenous contrast. Lower chest: The visualized lung bases are clear. Cardiac pacemaker wires noted. No intra-abdominal free air or free fluid. Hepatobiliary: The liver is unremarkable. No biliary dilatation.  Cholecystectomy. Pancreas: No active inflammatory changes. No dilatation of the main pancreatic duct Spleen: Normal in size without focal abnormality. Adrenals/Urinary Tract: The adrenal glands are unremarkable. The left kidney is atrophic. Bilateral renal cysts. No hydronephrosis or nephrolithiasis. The visualized ureters and urinary bladder appear unremarkable. Stomach/Bowel: There is no bowel obstruction or active inflammation. The appendix is normal. Vascular/Lymphatic: Moderate aortoiliac atherosclerotic disease. The IVC is unremarkable. No portal venous gas. There is no adenopathy. Reproductive: The prostate and seminal vesicles are grossly unremarkable. No pelvic mass. Other: Small fat containing left inguinal hernia. Musculoskeletal: Osteopenia with degenerative changes of the spine. No acute osseous pathology. IMPRESSION: 1. No acute intra-abdominal or pelvic pathology. No hydronephrosis or nephrolithiasis. 2. Atrophic left kidney. 3.  Aortic Atherosclerosis (ICD10-I70.0). Electronically Signed   By: Angus Bark M.D.   On: 09/30/2023 13:47  Procedures Procedures    Medications Ordered in ED Medications  lidocaine  (LIDODERM ) 5 % 1 patch (1 patch Transdermal Patch Applied 09/30/23 1439)  morphine  (PF) 2 MG/ML injection 2 mg (2 mg Intravenous Given 09/30/23 1224)  ondansetron  (ZOFRAN ) injection 4 mg (4 mg Intravenous Given 09/30/23 1224)  amLODipine  (NORVASC ) tablet 5 mg (5 mg Oral Given 09/30/23 1446)    ED Course/ Medical Decision Making/ A&P                                 Medical Decision Making 89 year old male with past medical history of prostate cancer as well as hypertension presenting to the emergency department today with left flank pain.  I will further evaluate the patient here with basic labs as well as a urinalysis to eval for pyelonephritis.  Will obtain a noncontrast CT scan after reviewing the patient's renal function at baseline.  This will further evaluate for  ureteral stones as well as malignancy related issues or obstruction.  The patient's neurovascular exam is reassuring.  Suspicion for aortic pathology is relatively low at this time.  I will reevaluate for ultimate disposition.  The patient's CT CT scan labs are reassuring.  His blood pressure did remain elevated.  He was given his evening dose of amlodipine  early.  I did obtain an EKG and this does appear similar in morphology to his previous EKG so there are no findings consistent with ischemia.  Creatinine is at his baseline.  There are no signs of endorgan dysfunction.  I think that he is stable for discharge.  Risk Prescription drug management.           Final Clinical Impression(s) / ED Diagnoses Final diagnoses:  Left flank pain  Elevated blood pressure reading    Rx / DC Orders ED Discharge Orders          Ordered    lidocaine  (LIDODERM ) 5 %  Every 24 hours        09/30/23 1544    diclofenac  Sodium (VOLTAREN ) 1 % GEL  4 times daily        09/30/23 1544    amLODipine  (NORVASC ) 10 MG tablet  Daily        09/30/23 1544              Carin Charleston, MD 09/30/23 1545

## 2023-09-30 NOTE — ED Notes (Signed)
 Discharge instructions, follow up care, and prescriptions reviewed and explained, pt verbalized understanding and had no further questions on d/c.

## 2023-10-04 DIAGNOSIS — M1612 Unilateral primary osteoarthritis, left hip: Secondary | ICD-10-CM | POA: Diagnosis not present

## 2023-10-04 DIAGNOSIS — M5432 Sciatica, left side: Secondary | ICD-10-CM | POA: Diagnosis not present

## 2023-10-30 NOTE — Progress Notes (Signed)
 Remote pacemaker transmission.

## 2023-11-25 ENCOUNTER — Telehealth: Payer: Self-pay

## 2023-11-25 NOTE — Telephone Encounter (Signed)
 Copied from CRM [id number]. Topic: Clinical - Medication Question >> Nov 25, 2023  1:50 PM [place] J wrote: Reason for CRM: Patient's wife would like to know if the patient is still supposed to be taking atorvastatin  40 MG.

## 2023-11-26 MED ORDER — ROSUVASTATIN CALCIUM 20 MG PO TABS: 20.0000 mg | ORAL_TABLET | Freq: Every day | ORAL | 0 refills | Status: DC

## 2023-11-26 NOTE — Addendum Note (Signed)
 Addended by: METTA KRISTEN CROME on: 11/26/2023 08:19 AM   Modules accepted: Orders

## 2023-11-26 NOTE — Telephone Encounter (Signed)
 F/u scheduled and patient's wife voiced understanding of message below

## 2023-11-27 ENCOUNTER — Ambulatory Visit: Admitting: Family Medicine

## 2023-11-27 ENCOUNTER — Encounter: Payer: Self-pay | Admitting: Family Medicine

## 2023-11-27 VITALS — BP 126/82 | HR 82 | Temp 97.9°F | Wt 206.6 lb

## 2023-11-27 DIAGNOSIS — C61 Malignant neoplasm of prostate: Secondary | ICD-10-CM

## 2023-11-27 DIAGNOSIS — E785 Hyperlipidemia, unspecified: Secondary | ICD-10-CM | POA: Diagnosis not present

## 2023-11-27 DIAGNOSIS — I10 Essential (primary) hypertension: Secondary | ICD-10-CM | POA: Diagnosis not present

## 2023-11-27 DIAGNOSIS — K5900 Constipation, unspecified: Secondary | ICD-10-CM | POA: Diagnosis not present

## 2023-11-27 MED ORDER — ROSUVASTATIN CALCIUM 20 MG PO TABS: 20.0000 mg | ORAL_TABLET | Freq: Every day | ORAL | 3 refills | Status: AC

## 2023-11-27 NOTE — Patient Instructions (Signed)
 Consider OTC Miralax 17 grams  once daily as needed for constipation.   Set up time for labs- for lipid panel.

## 2023-11-27 NOTE — Progress Notes (Signed)
 Established Patient Office Visit  Subjective   Patient ID: [name], male    DOB: 05/19/1932  Age: 89 y.o. MRN: [id number]  Chief Complaint  Patient presents with   Medical Management of Chronic Issues    HPI   [name] seen for medical follow-up.  He has past history of CVA, peripheral vascular disease, hypertension, atrial fibrillation, obstructive sleep apnea, osteoarthritis, chronic kidney disease, metastatic prostate cancer, hyperlipidemia, long history of nicotine use.  Very sedentary.  Generally doing well.  He has had some recent intermittent constipation issues.  Currently not taking anything for that.  No new medications.  No significant anticholinergic medications.  His metastatic prostate cancer is treated with Xtandi.  Patient did go to the ER in May with flank pain.  CT scan revealed no kidney stones.  Blood pressure very high at that time 193/99 and his amlodipine  was increased from 5 to 10 mg.  He remains on Eliquis  2.5 mg twice daily.  Also takes rosuvastatin  20 mg daily.  Denies any recent chest pains.  On metoprolol  50 mg twice daily.  No recent falls.  No increased peripheral edema since increasing amlodipine .  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/14/2013   DUAL CHAMBER PACEMAKER   Paroxysmal atrial fibrillation (HCC)    Unspecified cerebral artery occlusion with cerebral infarction    Unspecified disorder resulting from impaired renal function    Unspecified essential hypertension    Wears glasses    Wears partial dentures    upper partial   Past Surgical History:  Procedure Laterality Date    APPLICATION OF A-CELL OF EXTREMITY Right 08/12/2013   Procedure: APPLICATION OF A-CELL OF EXTREMITY;  Surgeon: Estefana Reichert, DO;  Location: [facility];  Service: Plastics;  Laterality: Right;   CHOLECYSTECTOMY  1987   COLONOSCOPY     ent surgery  2004   correct snoring   INGUINAL HERNIA REPAIR     left   INSERT / REPLACE / REMOVE PACEMAKER  04/14/2013   dual chamber   / Dr Francyne   MINOR IRRIGATION AND DEBRIDEMENT OF WOUND Right 03/04/2013   Procedure: MINOR IRRIGATION AND DEBRIDEMENT OF WOUND;  Surgeon: Estefana Reichert, DO;  Location: [facility];  Service: Plastics;  Laterality: Right;   ORCHIECTOMY  2002   PERMANENT PACEMAKER INSERTION N/A 04/14/2013   Procedure: PERMANENT PACEMAKER INSERTION;  Surgeon: Jerel Francyne, MD;  Location: [facility]ATH [facility];  Service: Cardiovascular;  Laterality: N/A;   PPM GENERATOR CHANGEOUT N/A 12/13/2022   Procedure: PPM GENERATOR CHANGEOUT;  Surgeon: Francyne Jerel, MD;  Location: [facility]AS[facility];  Service: Cardiovascular;  Laterality: N/A;   SKIN SPLIT GRAFT Right 08/12/2013   Procedure: SKIN GRAFT SPLIT THICKNESS WITH PLACEMENT OF VAC TO RIGHT LOWER LEG/PLACEMENT OF ACELL TO RIGHT UPPER THIGH AREA (HARVEST SITE);  Surgeon: Estefana Reichert, DO;  Location: [facility];  Service: Plastics;  Laterality: Right;    reports that he has been smoking cigarettes. He has a 15 pack-year smoking history. He has never used smokeless tobacco. He reports that he does not drink alcohol and does not use drugs. family history includes Cancer  in his father and sister; Prostate cancer in his father. No Known Allergies  Review of Systems  Constitutional:  Negative for chills, fever and malaise/fatigue.  Eyes:  Negative for blurred vision.  Respiratory:  Negative for cough.   Cardiovascular:  Negative for chest pain.  Gastrointestinal:  Positive for constipation. Negative for nausea and vomiting.  Neurological:  Negative for dizziness,  weakness and headaches.      Objective:     BP 126/82 (BP Location: Left Arm, Patient Position: Sitting, Cuff Size: Normal)   Pulse 82   Temp 97.9 F (36.6 C) (Oral)   Wt 206 lb 9.6 oz (93.7 kg)   SpO2 97%   BMI 28.81 kg/m    Physical Exam Vitals reviewed.  Constitutional:      General: He is not in acute distress. Cardiovascular:     Rate and Rhythm: Normal rate.  Pulmonary:     Comments: Somewhat distant breath sounds bilaterally.  No wheezes or rales. Musculoskeletal:     Comments: Only trace edema feet ankles and lower legs bilaterally  Neurological:     Mental Status: He is alert.      No results found for any visits on 11/27/23.    The ASCVD Risk score (Arnett DK, et al., 2019) failed to calculate for the following reasons:   The 2019 ASCVD risk score is only valid for ages 65 to 6   Risk score cannot be calculated because patient has a medical history suggesting prior/existing ASCVD    Assessment & Plan:   #1 hypertension.  Stable and well-controlled today.  Recent severe elevation and improved since increasing amlodipine  to 10 mg daily.  Reiterated importance of low-sodium diet.  #2 hyperlipidemia.  On rosuvastatin .  Overdue for labs.  He had recent comprehensive metabolic panel which was stable.  Future lab order for fasting lipid panel which he will try to get later this week.  #3 constipation.  Discussed measures to reduce including increase fluid intake, increase fiber, increased activity with walking, and consider trial of MiraLAX 17 g once or twice daily as needed  #4 metastatic prostate cancer followed by urology.  Overall stable.    Return in about 6 months (around 05/29/2024).    Wolm Scarlet, MD

## 2023-12-02 ENCOUNTER — Ambulatory Visit: Payer: Self-pay | Admitting: Family Medicine

## 2023-12-02 ENCOUNTER — Other Ambulatory Visit (INDEPENDENT_AMBULATORY_CARE_PROVIDER_SITE_OTHER)

## 2023-12-02 DIAGNOSIS — E785 Hyperlipidemia, unspecified: Secondary | ICD-10-CM | POA: Diagnosis not present

## 2023-12-02 LAB — LIPID PANEL
Cholesterol: 182 mg/dL (ref 0–200)
HDL: 55 mg/dL (ref >39.00)
LDL Cholesterol: 98 mg/dL (ref 0–99)
NonHDL: 126.91
Total CHOL/HDL Ratio: 3
Triglycerides: 147 mg/dL (ref 0.0–149.0)
VLDL: 29.4 mg/dL (ref 0.0–40.0)

## 2023-12-17 ENCOUNTER — Ambulatory Visit (INDEPENDENT_AMBULATORY_CARE_PROVIDER_SITE_OTHER): Payer: Medicare HMO

## 2023-12-17 DIAGNOSIS — I495 Sick sinus syndrome: Secondary | ICD-10-CM

## 2023-12-18 LAB — CUP PACEART REMOTE DEVICE CHECK
Battery Remaining Longevity: 126 mo
Battery Voltage: 3.04 V
Brady Statistic AP VP Percent: 16.85 %
Brady Statistic AP VS Percent: 82.11 %
Brady Statistic AS VP Percent: 0.09 %
Brady Statistic AS VS Percent: 0.96 %
Brady Statistic RA Percent Paced: 96.14 %
Brady Statistic RV Percent Paced: 18.23 %
Date Time Interrogation Session: 20250805054153
Implantable Lead Connection Status: 753985
Implantable Lead Connection Status: 753985
Implantable Lead Implant Date: 20141202
Implantable Lead Implant Date: 20141202
Implantable Lead Location: 753859
Implantable Lead Location: 753860
Implantable Lead Model: 5076
Implantable Lead Model: 5076
Implantable Pulse Generator Implant Date: 20240801
Lead Channel Impedance Value: 323 Ohm
Lead Channel Impedance Value: 342 Ohm
Lead Channel Impedance Value: 380 Ohm
Lead Channel Impedance Value: 418 Ohm
Lead Channel Pacing Threshold Amplitude: 0.625 V
Lead Channel Pacing Threshold Amplitude: 1.625 V
Lead Channel Pacing Threshold Pulse Width: 0.4 ms
Lead Channel Pacing Threshold Pulse Width: 0.4 ms
Lead Channel Sensing Intrinsic Amplitude: 18.625 mV
Lead Channel Sensing Intrinsic Amplitude: 18.625 mV
Lead Channel Sensing Intrinsic Amplitude: 2.375 mV
Lead Channel Sensing Intrinsic Amplitude: 2.375 mV
Lead Channel Setting Pacing Amplitude: 1.5 V
Lead Channel Setting Pacing Amplitude: 3.75 V
Lead Channel Setting Pacing Pulse Width: 0.4 ms
Lead Channel Setting Sensing Sensitivity: 0.9 mV
Zone Setting Status: 755011

## 2023-12-19 ENCOUNTER — Ambulatory Visit: Payer: Self-pay | Admitting: Cardiovascular Disease

## 2024-01-29 DIAGNOSIS — C61 Malignant neoplasm of prostate: Secondary | ICD-10-CM | POA: Diagnosis not present

## 2024-02-05 DIAGNOSIS — R9721 Rising PSA following treatment for malignant neoplasm of prostate: Secondary | ICD-10-CM | POA: Diagnosis not present

## 2024-02-05 DIAGNOSIS — C61 Malignant neoplasm of prostate: Secondary | ICD-10-CM | POA: Diagnosis not present

## 2024-02-05 DIAGNOSIS — N183 Chronic kidney disease, stage 3 unspecified: Secondary | ICD-10-CM | POA: Diagnosis not present

## 2024-02-05 DIAGNOSIS — C775 Secondary and unspecified malignant neoplasm of intrapelvic lymph nodes: Secondary | ICD-10-CM | POA: Diagnosis not present

## 2024-02-05 DIAGNOSIS — C7951 Secondary malignant neoplasm of bone: Secondary | ICD-10-CM | POA: Diagnosis not present

## 2024-02-05 DIAGNOSIS — Z192 Hormone resistant malignancy status: Secondary | ICD-10-CM | POA: Diagnosis not present

## 2024-02-10 NOTE — Progress Notes (Signed)
 Remote PPM Transmission

## 2024-03-17 ENCOUNTER — Ambulatory Visit: Payer: Medicare HMO

## 2024-03-17 DIAGNOSIS — I495 Sick sinus syndrome: Secondary | ICD-10-CM

## 2024-03-17 LAB — CUP PACEART REMOTE DEVICE CHECK
Battery Remaining Longevity: 129 mo
Battery Voltage: 3.03 V
Brady Statistic AP VP Percent: 20.53 %
Brady Statistic AP VS Percent: 78.37 %
Brady Statistic AS VP Percent: 0.1 %
Brady Statistic AS VS Percent: 1 %
Brady Statistic RA Percent Paced: 95.36 %
Brady Statistic RV Percent Paced: 21.91 %
Date Time Interrogation Session: 20251103194518
Implantable Lead Connection Status: 753985
Implantable Lead Connection Status: 753985
Implantable Lead Implant Date: 20141202
Implantable Lead Implant Date: 20141202
Implantable Lead Location: 753859
Implantable Lead Location: 753860
Implantable Lead Model: 5076
Implantable Lead Model: 5076
Implantable Pulse Generator Implant Date: 20240801
Lead Channel Impedance Value: 323 Ohm
Lead Channel Impedance Value: 323 Ohm
Lead Channel Impedance Value: 361 Ohm
Lead Channel Impedance Value: 380 Ohm
Lead Channel Pacing Threshold Amplitude: 0.625 V
Lead Channel Pacing Threshold Amplitude: 1.5 V
Lead Channel Pacing Threshold Pulse Width: 0.4 ms
Lead Channel Pacing Threshold Pulse Width: 0.4 ms
Lead Channel Sensing Intrinsic Amplitude: 19 mV
Lead Channel Sensing Intrinsic Amplitude: 19 mV
Lead Channel Sensing Intrinsic Amplitude: 2.375 mV
Lead Channel Sensing Intrinsic Amplitude: 2.375 mV
Lead Channel Setting Pacing Amplitude: 1.5 V
Lead Channel Setting Pacing Amplitude: 3 V
Lead Channel Setting Pacing Pulse Width: 0.4 ms
Lead Channel Setting Sensing Sensitivity: 0.9 mV
Zone Setting Status: 755011

## 2024-03-20 NOTE — Progress Notes (Signed)
 Remote PPM Transmission

## 2024-03-21 ENCOUNTER — Ambulatory Visit: Payer: Self-pay | Admitting: Cardiovascular Disease

## 2024-04-15 ENCOUNTER — Other Ambulatory Visit: Payer: Self-pay | Admitting: Cardiovascular Disease

## 2024-04-21 ENCOUNTER — Encounter (HOSPITAL_COMMUNITY): Payer: Self-pay | Admitting: Urology

## 2024-04-21 ENCOUNTER — Other Ambulatory Visit (HOSPITAL_COMMUNITY): Payer: Self-pay | Admitting: Urology

## 2024-04-21 DIAGNOSIS — Z192 Hormone resistant malignancy status: Secondary | ICD-10-CM

## 2024-04-21 DIAGNOSIS — C61 Malignant neoplasm of prostate: Secondary | ICD-10-CM

## 2024-04-21 DIAGNOSIS — R9721 Rising PSA following treatment for malignant neoplasm of prostate: Secondary | ICD-10-CM

## 2024-04-21 DIAGNOSIS — C7951 Secondary malignant neoplasm of bone: Secondary | ICD-10-CM

## 2024-04-21 DIAGNOSIS — C775 Secondary and unspecified malignant neoplasm of intrapelvic lymph nodes: Secondary | ICD-10-CM

## 2024-04-30 ENCOUNTER — Encounter (HOSPITAL_COMMUNITY)
Admission: RE | Admit: 2024-04-30 | Discharge: 2024-04-30 | Disposition: A | Discharge status: Home / Self Care | Source: Ambulatory Visit | Attending: Urology | Admitting: Urology

## 2024-04-30 DIAGNOSIS — C7952 Secondary malignant neoplasm of bone marrow: Secondary | ICD-10-CM | POA: Insufficient documentation

## 2024-04-30 DIAGNOSIS — Z192 Hormone resistant malignancy status: Secondary | ICD-10-CM | POA: Insufficient documentation

## 2024-04-30 DIAGNOSIS — C7951 Secondary malignant neoplasm of bone: Secondary | ICD-10-CM | POA: Insufficient documentation

## 2024-04-30 DIAGNOSIS — R9721 Rising PSA following treatment for malignant neoplasm of prostate: Secondary | ICD-10-CM | POA: Diagnosis present

## 2024-04-30 DIAGNOSIS — C61 Malignant neoplasm of prostate: Secondary | ICD-10-CM | POA: Diagnosis present

## 2024-04-30 DIAGNOSIS — C775 Secondary and unspecified malignant neoplasm of intrapelvic lymph nodes: Secondary | ICD-10-CM | POA: Insufficient documentation

## 2024-04-30 MED ORDER — FLOTUFOLASTAT F 18 GALLIUM 296-5846 MBQ/ML IV SOLN
8.5980 | Freq: Once | INTRAVENOUS | Status: AC
  Administered 2024-04-30: 16:00:00 8.598 via INTRAVENOUS

## 2024-05-21 ENCOUNTER — Encounter: Payer: Self-pay | Admitting: Cardiovascular Disease

## 2024-05-21 ENCOUNTER — Ambulatory Visit: Attending: Cardiovascular Disease | Admitting: Cardiovascular Disease

## 2024-05-21 VITALS — BP 106/64 | HR 63 | Ht 71.0 in | Wt 208.8 lb

## 2024-05-21 DIAGNOSIS — C61 Malignant neoplasm of prostate: Secondary | ICD-10-CM | POA: Diagnosis not present

## 2024-05-21 DIAGNOSIS — G4733 Obstructive sleep apnea (adult) (pediatric): Secondary | ICD-10-CM

## 2024-05-21 DIAGNOSIS — I480 Paroxysmal atrial fibrillation: Secondary | ICD-10-CM | POA: Diagnosis not present

## 2024-05-21 DIAGNOSIS — I10 Essential (primary) hypertension: Secondary | ICD-10-CM | POA: Diagnosis not present

## 2024-05-21 DIAGNOSIS — I495 Sick sinus syndrome: Secondary | ICD-10-CM

## 2024-05-21 DIAGNOSIS — Z950 Presence of cardiac pacemaker: Secondary | ICD-10-CM | POA: Diagnosis not present

## 2024-05-21 DIAGNOSIS — D6869 Other thrombophilia: Secondary | ICD-10-CM

## 2024-05-21 DIAGNOSIS — N184 Chronic kidney disease, stage 4 (severe): Secondary | ICD-10-CM

## 2024-05-21 NOTE — Progress Notes (Unsigned)
 "    Cardiology Office Note    Date:  05/21/2024   ID:  [name], DOB [birth year], MRN [id number]  PCP:  Micheal Wolm ORN, MD  Cardiologist:  Dorn Lesches, M.D.;  Jerel Balding, MD   No chief complaint on file.   History of Present Illness:  [name] is a 89 y.o. male with sinus node dysfunction and paroxysmal atrial fibrillation, dual-chamber permanent pacemaker, returning in follow-up after pacemaker generator change out (Medtronic Azure MRI conditional dual-chamber 12/13/2022, Medtronic 5076 MRI compatible leads).  His initial device was implanted in 2014 (MRI conditional Medtronic Advisa dual-chamber pacemaker) for tachycardia-bradycardia syndrome. Additional medical problems include hyperlipidemia, hypertension, obstructive sleep apnea, chronic renal insufficiency stage IV, metastatic prostate cancer followed by Dr. Watt.    He is doing well and has no cardiovascular complaints today.  As before, his pacemaker records occasional episodes of atrial fibrillation, but he is not aware of these.  He does not have palpitations or syncope, but has occasional orthostatic dizziness.  He denies orthopnea, PND, lower extremity edema.  He has never complained of angina.  He has been less active recently because of problems with his left knee.  Over the years the burden of atrial fibrillation has increased slightly and over the last few months has been 4.6%.  When he is not in atrial fibrillation he has virtually 100% atrial pacing (94.2%).  He only requires ventricular pacing 24% of the time.  His device has recorded 2 episodes of nonsustained VT which actually represent atrial fibrillation with rapid ventricular response), but the vast majority of the time the atrial fibrillation has good ventricular rate control.  Activity level has been decreased down to about 0.3 hours/day since he had his knee problems.  Metabolic parameters are pretty good with the most recent LDL of 71  and HDL of 48 and hemoglobin A1c of 6.3%.  His most recent creatinine was 2.2 (his baseline is usually in the 2.0-2.5 range, corresponding to a GFR of 25-30).    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/14/2013   DUAL CHAMBER PACEMAKER   Paroxysmal atrial fibrillation (HCC)    Unspecified cerebral artery occlusion with cerebral infarction    Unspecified disorder resulting from impaired renal function    Unspecified essential hypertension    Wears glasses    Wears partial dentures    upper partial    Past Surgical History:  Procedure Laterality Date   APPLICATION OF A-CELL OF EXTREMITY Right 08/12/2013   Procedure: APPLICATION OF A-CELL OF EXTREMITY;  Surgeon: Estefana Reichert, DO;  Location: [facility];  Service: Plastics;  Laterality: Right;   CHOLECYSTECTOMY  1987   COLONOSCOPY     ent surgery  2004   correct snoring   INGUINAL HERNIA REPAIR     left   INSERT / REPLACE / REMOVE PACEMAKER  04/14/2013   dual chamber   / Dr Balding   MINOR IRRIGATION AND DEBRIDEMENT OF WOUND Right 03/04/2013   Procedure: MINOR IRRIGATION AND DEBRIDEMENT OF WOUND;  Surgeon: Estefana Reichert, DO;  Location: [facility];  Service: Plastics;  Laterality: Right;   ORCHIECTOMY  2002  PERMANENT PACEMAKER INSERTION N/A 04/14/2013   Procedure: PERMANENT PACEMAKER INSERTION;  Surgeon: Jerel Balding, MD;  Location: [facility];  Service: Cardiovascular;  Laterality: N/A;   PPM GENERATOR CHANGEOUT N/A 12/13/2022   Procedure: PPM GENERATOR CHANGEOUT;  Surgeon: Balding Jerel, MD;  [facility]: [facility];   Service: Cardiovascular;  Laterality: N/A;   SKIN SPLIT GRAFT Right 08/12/2013   Procedure: SKIN GRAFT SPLIT THICKNESS WITH PLACEMENT OF VAC TO RIGHT LOWER LEG/PLACEMENT OF ACELL TO RIGHT UPPER THIGH AREA (HARVEST SITE);  Surgeon: Estefana Reichert, DO;  Location: [facility];  Service: Plastics;  Laterality: Right;    Current Medications: Outpatient Medications Prior to Visit  Medication Sig Dispense Refill   amLODipine  (NORVASC ) 5 MG tablet Take 5 mg by mouth daily.     apixaban  (ELIQUIS ) 2.5 MG TABS tablet Take 1 tablet (2.5 mg total) by mouth 2 (two) times daily. 28 tablet 0   apixaban  (ELIQUIS ) 2.5 MG TABS tablet TAKE 1 TABLET(2.5 MG) BY MOUTH TWICE DAILY 28 tablet 0   apixaban  (ELIQUIS ) 2.5 MG TABS tablet Take 1 tablet (2.5 mg total) by mouth 2 (two) times daily. 28 tablet 0   apixaban  (ELIQUIS ) 2.5 MG TABS tablet Take 1 tablet (2.5 mg total) by mouth 2 (two) times daily. 180 tablet 1   apixaban  (ELIQUIS ) 2.5 MG TABS tablet Take 1 tablet (2.5 mg total) by mouth 2 (two) times daily. 28 tablet    apixaban  (ELIQUIS ) 2.5 MG TABS tablet TAKE 1 TABLET TWICE DAILY 180 tablet 1   Ascorbic Acid (VITAMIN C) 1000 MG tablet Take 1,000 mg by mouth daily.     aspirin  EC 81 MG EC tablet Take 1 tablet (81 mg total) by mouth daily. 30 tablet 0   cholecalciferol (VITAMIN D3) 25 MCG (1000 UNIT) tablet Take 1,000 Units by mouth daily.     metoprolol  tartrate (LOPRESSOR ) 50 MG tablet TAKE 1 TABLET TWICE DAILY 180 tablet 3   Multiple Vitamin (MULTIVITAMIN WITH MINERALS) TABS tablet Take 1 tablet by mouth daily.     rosuvastatin  (CRESTOR ) 20 MG tablet Take 1 tablet (20 mg total) by mouth daily. 90 tablet 3   XTANDI 40 MG capsule Take 80 mg by mouth daily.     acetaminophen  (TYLENOL ) 325 MG tablet Take 2 tablets (650 mg total) by mouth every 6 (six) hours as needed. (Patient not taking: Reported on 05/21/2024) 36 tablet 0   albuterol  (VENTOLIN  HFA) 108 (90 Base) MCG/ACT inhaler Inhale 2  puffs into the lungs every 6 (six) hours as needed for wheezing or shortness of breath. (Patient not taking: Reported on 05/21/2024) 18 g 3   amLODipine  (NORVASC ) 10 MG tablet Take 1 tablet (10 mg total) by mouth daily. 30 tablet 0   diclofenac  Sodium (VOLTAREN ) 1 % GEL Apply 2 g topically 4 (four) times daily. 100 g 0   lidocaine  (LIDODERM ) 5 % Place 1 patch onto the skin daily. Remove & Discard patch within 12 hours or as directed by MD 30 patch 0   XTANDI 40 MG tablet Take 80 mg by mouth 2 (two) times daily.     No facility-administered medications prior to visit.     Allergies:   Patient has no known allergies.   Social History   Socioeconomic History   Marital status: Married    Spouse name: joan Maron   Number of children: Not on file   Years of education: Not on file   Highest education level: Not on file  Occupational History   Occupation: Retired   Occupation: RETIRED    Employer: RETIRED  Tobacco Use   Smoking status: Some Days    Current packs/day: 0.50    Average packs/day: 0.5 packs/day for 30.0 years (15.0 ttl pk-yrs)    Types: Cigarettes   Smokeless tobacco: Never   Tobacco comments:    05/21/2024 Patient smokes about 10 cigarettes when he smoke    pt stated he smokes 1 pack every 2 wks    04/04/2023 Patient smoke about 3 cigarettes daily  Vaping Use   Vaping status: Never Used  Substance and Sexual Activity   Alcohol use: No   Drug use: No   Sexual activity: Not on file    Comment: started smoking 2-3 cig daily  Other Topics Concern   Not on file  Social History Narrative   Not on file   Social Drivers of Health   Tobacco Use: High Risk (05/21/2024)   Patient History    Smoking Tobacco Use: Some Days    Smokeless Tobacco Use: Never    Passive Exposure: Not on file  Financial Resource Strain: Low Risk (09/27/2023)   Overall Financial Resource Strain (CARDIA)    Difficulty of Paying Living Expenses: Not hard at all  Food  Insecurity: No Food Insecurity (09/27/2023)   Hunger Vital Sign    Worried About Running Out of Food in the Last Year: Never true    Ran Out of Food in the Last Year: Never true  Transportation Needs: No Transportation Needs (09/27/2023)   PRAPARE - Administrator, Civil Service (Medical): No    Lack of Transportation (Non-Medical): No  Physical Activity: Insufficiently Active (09/27/2023)   Exercise Vital Sign    Days of Exercise per Week: 2 days    Minutes of Exercise per Session: 30 min  Stress: No Stress Concern Present (09/27/2023)   [employer] of Occupational Health - Occupational Stress Questionnaire    Feeling of Stress : Not at all  Social Connections: Socially Integrated (09/27/2023)   Social Connection and Isolation Panel    Frequency of Communication with Friends and Family: More than three times a week    Frequency of Social Gatherings with Friends and Family: More than three times a week    Attends Religious Services: More than 4 times per year    Active Member of Clubs or Organizations: Yes    Attends Banker Meetings: More than 4 times per year    Marital Status: Married  Depression (PHQ2-9): Low Risk (09/27/2023)   Depression (PHQ2-9)    PHQ-2 Score: 0  Alcohol Screen: Low Risk (09/27/2023)   Alcohol Screen    Last Alcohol Screening Score (AUDIT): 0  Housing: Unknown (09/27/2023)   Housing Stability Vital Sign    Unable to Pay for Housing in the Last Year: No    Number of Times Moved in the Last Year: Not on file    Homeless in the Last Year: No  Utilities: Not At Risk (09/27/2023)   AHC Utilities    Threatened with loss of utilities: No  Health Literacy: Adequate Health Literacy (09/27/2023)   B1300 Health Literacy    Frequency of need for help with medical instructions: Never     Family History:  The patient's family history includes Cancer in his father and sister; Prostate cancer in his father.   ROS:   Please  see the history of present illness.    ROS All other systems are reviewed  and are negative.   PHYSICAL EXAM:   VS:  BP 106/64 (BP Location: Left Arm, Patient Position: Sitting, Cuff Size: Normal)   Pulse 63   Ht 5' 11 (1.803 m)   Wt 208 lb 12.8 oz (94.7 kg)   SpO2 95%   BMI 29.12 kg/m    Vital signs reviewed.  Unable to examine.  Wt Readings from Last 3 Encounters:  05/21/24 208 lb 12.8 oz (94.7 kg)  11/27/23 206 lb 9.6 oz (93.7 kg)  09/27/23 211 lb 4.8 oz (95.8 kg)     General: Alert, oriented x3, no distress, healthy left subclavian pacemaker site Head: no evidence of trauma, PERRL, EOMI, no exophtalmos or lid lag, no myxedema, no xanthelasma; normal ears, nose and oropharynx Neck: normal jugular venous pulsations and no hepatojugular reflux; brisk carotid pulses without delay and no carotid bruits Chest: clear to auscultation, no signs of consolidation by percussion or palpation, normal fremitus, symmetrical and full respiratory excursions Cardiovascular: normal position and quality of the apical impulse, regular rhythm, normal first and second heart sounds, no murmurs, rubs or gallops Abdomen: no tenderness or distention, no masses by palpation, no abnormal pulsatility or arterial bruits, normal bowel sounds, no hepatosplenomegaly Extremities: no clubbing, cyanosis or edema; 2+ radial, ulnar and brachial pulses bilaterally; 2+ right femoral, posterior tibial and dorsalis pedis pulses; 2+ left femoral, posterior tibial and dorsalis pedis pulses; no subclavian or femoral bruits Neurological: grossly nonfocal Psych: Normal mood and affect    Studies/Labs Reviewed:   EKG:   EKG Interpretation Date/Time:  Thursday May 21 2024 11:31:12 EST Ventricular Rate:  63 PR Interval:  276 QRS Duration:  84 QT Interval:  382 QTC Calculation: 390 R Axis:   -9  Text Interpretation: Atrial-paced rhythm with prolonged AV conduction ST & T wave abnormality, consider inferior ischemia ST  & T wave abnormality, consider anterolateral ischemia When compared with ECG of 30-Sep-2023 14:50,  V pacing is  no longer seen Confirmed by Vandora Jaskulski ([id number]) on 05/21/2024 11:42:45 AM         Recent Labs: 09/30/2023: ALT 12; BUN 23; Creatinine, Ser 2.17; Hemoglobin 13.2; Platelets 181; Potassium 4.4; Sodium 144   More recent labs from 03/08/2023 showed creatinine of 2.2, potassium 5.0, ALT 7, hemoglobin 13.8  Lipid Panel    Component Value Date/Time   CHOL 182 12/02/2023 1023   CHOL 181 05/12/2019 0945   TRIG 147.0 12/02/2023 1023   TRIG 77 04/11/2006 0906   HDL 55.00 12/02/2023 1023   HDL 42 05/12/2019 0945   CHOLHDL 3 12/02/2023 1023   VLDL 29.4 12/02/2023 1023   LDLCALC 98 12/02/2023 1023   LDLCALC 103 (H) 05/12/2019 0945   LDLDIRECT 112.0 05/17/2017 0855     ASSESSMENT:    1. SSS (sick sinus syndrome) (HCC)   2. PAF (paroxysmal atrial fibrillation) (HCC)   3. Acquired thrombophilia   4. Essential hypertension   5. Pacemaker       PLAN:  In order of problems listed above:  AFib: Somewhat increased prevalence of atrial fibrillation now at 4.6%, generally with good ventricular rate control, on appropriate anticoagulation. 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  (CHADSVasc 5: age 31, stroke 2, hypertension).   Anticoagulation: Denies bleeding complications.  Dose adjusted for age and renal dysfunction. HTN: Very well-controlled, maybe even excessively so.  Consider decreasing his dose of amlodipine . SSS: Virtually 100% atrial pacing when he is not in atrial fibrillation.  Underlying rhythm is severe sinus bradycardia around 40  bpm.  Heart rate histogram distribution appears appropriate. PPM: Recent generator change.  The site has healed well.  Continue remote downloads every 3 months. OSA: Reports 100%compliance. Metastatic prostate cancer: He does not have any symptoms of metastatic bone disease and has good quality of life.. CKD 4: Creatinine seems to be  stable around 2.2.  GFR 25-30.  Medication Adjustments/Labs and Tests Ordered: Current medicines are reviewed at length with the patient today.  Concerns regarding medicines are outlined above.  Medication changes, Labs and Tests ordered today are listed in the Patient Instructions below. There are no Patient Instructions on file for this visit.    Signed, Jerel Balding, MD  05/21/2024 11:42 AM    [facility] [address] [place], [place], [place]  [address] Phone: [phone]; Fax: [phone]   "

## 2024-05-21 NOTE — Patient Instructions (Addendum)
 Medication Instructions:  Your physician recommends that you continue on your current medications as directed. Please refer to the Current Medication list given to you today.  *If you need a refill on your cardiac medications before your next appointment, please call your pharmacy*   Follow-Up: At [facility], you and your health needs are our priority.  As part of our continuing mission to provide you with exceptional heart care, our providers are all part of one team.  This team includes your primary Cardiologist (physician) and Advanced Practice Providers or APPs (Physician Assistants and Nurse Practitioners) who all work together to provide you with the care you need, when you need it.  Your next appointment:   1 year(s) call in July for an appointment   Provider:   Jerel Balding, MD    Other Instructions:

## 2024-05-26 ENCOUNTER — Encounter: Payer: Self-pay | Admitting: Cardiovascular Disease

## 2024-05-29 ENCOUNTER — Encounter: Payer: Self-pay | Admitting: Family Medicine

## 2024-05-29 ENCOUNTER — Ambulatory Visit: Admitting: Family Medicine

## 2024-05-29 VITALS — BP 126/80 | HR 81 | Temp 98.0°F | Wt 210.0 lb

## 2024-05-29 DIAGNOSIS — R739 Hyperglycemia, unspecified: Secondary | ICD-10-CM

## 2024-05-29 DIAGNOSIS — N184 Chronic kidney disease, stage 4 (severe): Secondary | ICD-10-CM | POA: Diagnosis not present

## 2024-05-29 DIAGNOSIS — Z23 Encounter for immunization: Secondary | ICD-10-CM

## 2024-05-29 DIAGNOSIS — I10 Essential (primary) hypertension: Secondary | ICD-10-CM | POA: Diagnosis not present

## 2024-05-29 LAB — POCT GLYCOSYLATED HEMOGLOBIN (HGB A1C): Hemoglobin A1C: 6.4 % — AB (ref 4.0–5.6)

## 2024-05-29 NOTE — Progress Notes (Signed)
 "  Established Patient Office Visit  Subjective   Patient ID: [name], male    DOB: January 10, 1933  Age: 89 y.o. MRN: [id number]  Chief Complaint  Patient presents with   Medical Management of Chronic Issues    HPI    [name] has chronic problems including history of CVA, peripheral vascular disease, hypertension, atrial fibrillation, history of sick sinus syndrome, obstructive sleep apnea, hyperglycemia, metastatic prostate cancer.  Here for routine medical follow-up.  Medications include Xtandi, rosuvastatin , metoprolol  tartrate, Eliquis , baby aspirin , and amlodipine  5 mg daily.  Generally doing well.  No recent falls.  He has chronic kidney disease with last GFR 29.  Prostate cancer was diagnosed 23 years ago.  No recent A1c.  Has not had flu vaccine but consents to getting this today.  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/14/2013   DUAL CHAMBER PACEMAKER   Paroxysmal atrial fibrillation (HCC)    Unspecified cerebral artery occlusion with cerebral infarction    Unspecified disorder resulting from impaired renal function    Unspecified essential hypertension    Wears glasses    Wears partial dentures    upper partial   Past Surgical History:  Procedure Laterality Date   APPLICATION OF A-CELL OF EXTREMITY Right 08/12/2013   Procedure: APPLICATION OF A-CELL OF EXTREMITY;  Surgeon: Estefana Reichert, DO;  Location: [facility];  Service: Plastics;  Laterality: Right;   CHOLECYSTECTOMY  1987   COLONOSCOPY     ent surgery  2004   correct snoring   INGUINAL HERNIA REPAIR     left   INSERT / REPLACE /  REMOVE PACEMAKER  04/14/2013   dual chamber   / Dr Francyne   MINOR IRRIGATION AND DEBRIDEMENT OF WOUND Right 03/04/2013   Procedure: MINOR IRRIGATION AND DEBRIDEMENT OF WOUND;  Surgeon: Estefana Reichert, DO;  Location: [facility];  Service: Plastics;  Laterality: Right;   ORCHIECTOMY  2002   PERMANENT PACEMAKER INSERTION N/A 04/14/2013   Procedure: PERMANENT PACEMAKER INSERTION;  Surgeon: Jerel Francyne, MD;  Location: [facility];  Service: Cardiovascular;  Laterality: N/A;   PPM GENERATOR CHANGEOUT N/A 12/13/2022   Procedure: PPM GENERATOR CHANGEOUT;  Surgeon: Francyne Jerel, MD;  Location: [facility];  Service: Cardiovascular;  Laterality: N/A;   SKIN SPLIT GRAFT Right 08/12/2013   Procedure: SKIN GRAFT SPLIT THICKNESS WITH PLACEMENT OF VAC TO RIGHT LOWER LEG/PLACEMENT OF ACELL TO RIGHT UPPER THIGH AREA (HARVEST SITE);  Surgeon: Estefana Reichert, DO;  Location: [facility];  Service: Plastics;  Laterality: Right;    reports that he has been smoking cigarettes. He has a 15 pack-year smoking history. He has never used smokeless tobacco. He reports that he does not drink alcohol and does not use drugs. family history includes Cancer in his father and sister; Prostate cancer in his father. Allergies[1]  Review of Systems  Constitutional:  Negative for malaise/fatigue.  Eyes:  Negative for blurred vision.  Respiratory:  Negative for shortness of breath.   Cardiovascular:  Negative for chest pain.  Gastrointestinal:  Negative for abdominal pain.  Neurological:  Negative for dizziness, weakness and headaches.  Objective:     BP 126/80   Pulse 81   Temp 98 F (36.7 C) (Oral)   Wt 210 lb (95.3 kg)   SpO2 95%   BMI 29.29 kg/m  BP Readings from Last 3 Encounters:  05/29/24 126/80  05/21/24 106/64  11/27/23 126/82   Wt Readings from Last 3 Encounters:  05/29/24 210 lb (95.3 kg)  05/21/24 208 lb 12.8 oz (94.7 kg)  11/27/23 206 lb 9.6 oz (93.7 kg)       Physical Exam Vitals reviewed.  Constitutional:      Appearance: He is well-developed.  Eyes:     Pupils: Pupils are equal, round, and reactive to light.  Neck:     Thyroid : No thyromegaly.  Cardiovascular:     Rate and Rhythm: Normal rate.  Pulmonary:     Effort: Pulmonary effort is normal. No respiratory distress.     Breath sounds: Normal breath sounds. No wheezing or rales.  Musculoskeletal:     Cervical back: Neck supple.     Right lower leg: No edema.     Left lower leg: No edema.  Neurological:     Mental Status: He is alert and oriented to person, place, and time.      No results found for any visits on 05/29/24.  Last CBC Lab Results  Component Value Date   WBC 9.8 09/30/2023   HGB 13.2 09/30/2023   HCT 41.6 09/30/2023   MCV 93.5 09/30/2023   MCH 29.7 09/30/2023   RDW 14.5 09/30/2023   PLT 181 09/30/2023   Last metabolic panel Lab Results  Component Value Date   GLUCOSE 118 (H) 09/30/2023   NA 144 09/30/2023   K 4.4 09/30/2023   CL 109 09/30/2023   CO2 24 09/30/2023   BUN 23 09/30/2023   CREATININE 2.17 (H) 09/30/2023   GFRNONAA 29 (L) 09/30/2023   CALCIUM  10.8 (H) 09/30/2023   PROT 7.6 09/30/2023   ALBUMIN 4.1 09/30/2023   BILITOT 0.3 09/30/2023   ALKPHOS 118 09/30/2023   AST 20 09/30/2023   ALT 12 09/30/2023   ANIONGAP 11 09/30/2023   Last lipids Lab Results  Component Value Date   CHOL 182 12/02/2023   HDL 55.00 12/02/2023   LDLCALC 98 12/02/2023   LDLDIRECT 112.0 05/17/2017   TRIG 147.0 12/02/2023   CHOLHDL 3 12/02/2023   Last hemoglobin A1c Lab Results  Component Value Date   HGBA1C 6.4 (A) 05/29/2024   Last thyroid  functions Lab Results  Component Value Date   TSH 1.77 08/03/2019   Last vitamin D  Lab Results  Component Value Date   VD25OH 28 (L) 07/03/2012      The ASCVD Risk score (Arnett DK, et al., 2019) failed to calculate for the following reasons:   The 2019 ASCVD risk score is only valid for ages 59 to  25   Risk score cannot be calculated because patient has a medical history suggesting prior/existing ASCVD   * - Cholesterol units were assumed    Assessment & Plan:    #1 hypertension stable and at goal.  Continue current blood pressure regimen as above.  Continue low-sodium diet.  #2 history of hyperglycemia.  Reassess A1c today.  #3 chronic kidney disease stage IV with most recent GFR of 29.  Continue to avoid nonsteroidals and stay adequately hydrated.  #4 health maintenance no flu vaccine and patient does consent to getting that today.  Wolm Scarlet, MD     [1] No Known Allergies  "

## 2024-06-16 ENCOUNTER — Encounter

## 2024-06-16 LAB — CUP PACEART REMOTE DEVICE CHECK
Battery Remaining Longevity: 113 mo
Battery Voltage: 3.01 V
Brady Statistic AP VP Percent: 35.12 %
Brady Statistic AP VS Percent: 62.63 %
Brady Statistic AS VP Percent: 0.43 %
Brady Statistic AS VS Percent: 1.83 %
Brady Statistic RA Percent Paced: 85.83 %
Brady Statistic RV Percent Paced: 39.08 %
Date Time Interrogation Session: 20260202202504
Implantable Lead Connection Status: 753985
Implantable Lead Connection Status: 753985
Implantable Lead Implant Date: 20141202
Implantable Lead Implant Date: 20141202
Implantable Lead Location: 753859
Implantable Lead Location: 753860
Implantable Lead Model: 5076
Implantable Lead Model: 5076
Implantable Pulse Generator Implant Date: 20240801
Lead Channel Impedance Value: 304 Ohm
Lead Channel Impedance Value: 323 Ohm
Lead Channel Impedance Value: 361 Ohm
Lead Channel Impedance Value: 380 Ohm
Lead Channel Pacing Threshold Amplitude: 0.5 V
Lead Channel Pacing Threshold Amplitude: 1.5 V
Lead Channel Pacing Threshold Pulse Width: 0.4 ms
Lead Channel Pacing Threshold Pulse Width: 0.4 ms
Lead Channel Sensing Intrinsic Amplitude: 1.625 mV
Lead Channel Sensing Intrinsic Amplitude: 1.625 mV
Lead Channel Sensing Intrinsic Amplitude: 19.375 mV
Lead Channel Sensing Intrinsic Amplitude: 19.375 mV
Lead Channel Setting Pacing Amplitude: 1.5 V
Lead Channel Setting Pacing Amplitude: 4 V
Lead Channel Setting Pacing Pulse Width: 0.4 ms
Lead Channel Setting Sensing Sensitivity: 0.9 mV
Zone Setting Status: 755011

## 2024-06-18 ENCOUNTER — Ambulatory Visit: Payer: Self-pay | Admitting: Cardiovascular Disease

## 2024-09-15 ENCOUNTER — Encounter

## 2024-10-02 ENCOUNTER — Ambulatory Visit

## 2024-12-15 ENCOUNTER — Encounter
